# Patient Record
Sex: Male | Born: 1940 | Race: White | Hispanic: No | Marital: Married | Smoking: Never smoker
Health system: Southern US, Community
[De-identification: ages and names within clinical notes are randomized; demographics above are authoritative.]

## PROBLEM LIST (undated history)

## (undated) DIAGNOSIS — C679 Malignant neoplasm of bladder, unspecified: Secondary | ICD-10-CM

## (undated) DIAGNOSIS — E785 Hyperlipidemia, unspecified: Secondary | ICD-10-CM

## (undated) DIAGNOSIS — G4730 Sleep apnea, unspecified: Secondary | ICD-10-CM

## (undated) DIAGNOSIS — I509 Heart failure, unspecified: Secondary | ICD-10-CM

## (undated) DIAGNOSIS — H269 Unspecified cataract: Secondary | ICD-10-CM

## (undated) DIAGNOSIS — M1990 Unspecified osteoarthritis, unspecified site: Secondary | ICD-10-CM

## (undated) DIAGNOSIS — G7000 Myasthenia gravis without (acute) exacerbation: Secondary | ICD-10-CM

## (undated) DIAGNOSIS — I639 Cerebral infarction, unspecified: Secondary | ICD-10-CM

## (undated) DIAGNOSIS — I482 Chronic atrial fibrillation, unspecified: Secondary | ICD-10-CM

## (undated) DIAGNOSIS — R0902 Hypoxemia: Secondary | ICD-10-CM

## (undated) DIAGNOSIS — K219 Gastro-esophageal reflux disease without esophagitis: Secondary | ICD-10-CM

## (undated) DIAGNOSIS — C772 Secondary and unspecified malignant neoplasm of intra-abdominal lymph nodes: Secondary | ICD-10-CM

## (undated) DIAGNOSIS — E119 Type 2 diabetes mellitus without complications: Secondary | ICD-10-CM

## (undated) DIAGNOSIS — R011 Cardiac murmur, unspecified: Secondary | ICD-10-CM

## (undated) DIAGNOSIS — I10 Essential (primary) hypertension: Secondary | ICD-10-CM

## (undated) HISTORY — DX: Hyperlipidemia, unspecified: E78.5

## (undated) HISTORY — DX: Cerebral infarction, unspecified: I63.9

## (undated) HISTORY — DX: Cardiac murmur, unspecified: R01.1

## (undated) HISTORY — PX: OTHER SURGICAL HISTORY: SHX169

## (undated) HISTORY — DX: Unspecified cataract: H26.9

## (undated) HISTORY — DX: Myasthenia gravis without (acute) exacerbation: G70.00

## (undated) HISTORY — DX: Gastro-esophageal reflux disease without esophagitis: K21.9

## (undated) HISTORY — DX: Sleep apnea, unspecified: G47.30

## (undated) HISTORY — DX: Unspecified osteoarthritis, unspecified site: M19.90

## (undated) HISTORY — DX: Hypoxemia: R09.02

---

## 2010-04-29 ENCOUNTER — Encounter: Payer: Self-pay | Admitting: *Deleted

## 2010-04-29 ENCOUNTER — Telehealth (INDEPENDENT_AMBULATORY_CARE_PROVIDER_SITE_OTHER): Payer: Self-pay | Admitting: *Deleted

## 2010-05-03 ENCOUNTER — Ambulatory Visit: Payer: Self-pay

## 2010-05-03 ENCOUNTER — Encounter: Payer: Self-pay | Admitting: Cardiology

## 2010-05-03 ENCOUNTER — Encounter (HOSPITAL_COMMUNITY): Admission: RE | Admit: 2010-05-03 | Discharge: 2010-07-21 | Payer: Self-pay | Admitting: Family Medicine

## 2010-05-03 ENCOUNTER — Ambulatory Visit (HOSPITAL_COMMUNITY): Admission: RE | Admit: 2010-05-03 | Discharge: 2010-05-03 | Payer: Self-pay | Admitting: Family Medicine

## 2010-05-03 ENCOUNTER — Ambulatory Visit: Payer: Self-pay | Admitting: Cardiology

## 2010-12-14 NOTE — Progress Notes (Signed)
Summary: Nuclear Pre-Procedure  Phone Note Outgoing Call Call back at [facility] Phone [phone]   Call placed by: Stanton Kidney, EMT-P,  April 29, 2010 11:24 AM Action Taken: Phone Call Completed Summary of Call: Reviewed information on Myoview Information Sheet (see scanned document for further details).  Spoke with Patient's wife.    Nuclear Med Background Indications for Stress Test: Evaluation for Ischemia   History: GXT  History Comments: '93 & '00 GXT: (-)  Symptoms: Chest Pressure, DOE    Nuclear Pre-Procedure Cardiac Risk Factors: CVA, Hypertension, NIDDM Chest Size (in) 52     Height (in): 67

## 2010-12-14 NOTE — Assessment & Plan Note (Signed)
Summary: Cardiology Nuclear Study  Nuclear Med Background Indications for Stress Test: Evaluation for Ischemia   History: Echo, GXT  History Comments: '00 [id number]; 05/03/10 Echo:EF=60%, mild AS  Symptoms: Chest Pressure, Chest Pressure with Exertion, DOE, Fatigue, Palpitations  Symptoms Comments: Last episode of JW:[id number]   Nuclear Pre-Procedure Cardiac Risk Factors: CVA, Family History - CAD, Hypertension, NIDDM, Obesity Caffeine/Decaff Intake: None NPO After: 10:00 PM Lungs: Clear.  O2 Sat 97% on RA. IV 0.9% NS with Angio Cath: 22g     IV Site: (R) Hand IV Started by: Stanton Kidney EMT-P Chest Size (in) 52     Height (in): 67 Weight (lb): 290 BMI: 45.58 Tech Comments: CBG= 127 @ 5 am this day,  per Patient  Nuclear Med Study 1 or 2 day study:  1 day     Stress Test Type:  Eugenie Birks Reading MD:  Willa Rough, MD     Referring MD:  Lucila Maine, MD Resting Radionuclide:  Technetium 44m Tetrofosmin     Resting Radionuclide Dose:  11 mCi  Stress Radionuclide:  Technetium 84m Tetrofosmin     Stress Radionuclide Dose:  33 mCi   Stress Protocol   Lexiscan: 0.4 mg   Stress Test Technologist:  Rea [employer] CMA-N     Nuclear Technologist:  Domenic Polite CNMT  Rest Procedure  Myocardial perfusion imaging was performed at rest 45 minutes following the intravenous administration of Myoview Technetium 26m Tetrofosmin.  Stress Procedure  The patient received IV Lexiscan 0.4 mg over 15-seconds.  Myoview injected at 30-seconds.  There were no significant changes with infusion; other than occasional PVC's.  He did c/o chest tightness.  Quantitative spect images were obtained after a 45 minute delay.  QPS Raw Data Images:  Normal; no motion artifact; normal heart/lung ratio. Stress Images:  There is normal uptake in all areas. Rest Images:  Normal homogeneous uptake in all areas of the myocardium. Subtraction (SDS):  No evidence of ischemia. Transient Ischemic Dilatation:   1.11  (Normal <1.22)  Lung/Heart Ratio:  .3  (Normal <0.45)  Quantitative Gated Spect Images QGS EDV:  137 ml QGS ESV:  57 ml QGS EF:  59 % QGS cine images:  Normal motion  Findings Normal nuclear study      Overall Impression  Exercise Capacity: Lexiscan BP Response: Normal blood pressure response. Clinical Symptoms: chest tight ECG Impression: No significant ST segment change suggestive of ischemia. Overall Impression: Normal stress nuclear study.

## 2014-12-05 ENCOUNTER — Inpatient Hospital Stay (HOSPITAL_COMMUNITY)
Admission: AD | Admit: 2014-12-05 | Discharge: 2014-12-17 | DRG: 208 | Disposition: A | Discharge status: Rehabilitation Facility | Payer: PPO | Source: Other Acute Inpatient Hospital | Attending: Internal Medicine | Admitting: Internal Medicine

## 2014-12-05 ENCOUNTER — Encounter (HOSPITAL_COMMUNITY): Payer: Self-pay | Admitting: Neurology

## 2014-12-05 ENCOUNTER — Inpatient Hospital Stay (HOSPITAL_COMMUNITY): Payer: PPO

## 2014-12-05 DIAGNOSIS — A047 Enterocolitis due to Clostridium difficile: Secondary | ICD-10-CM | POA: Diagnosis not present

## 2014-12-05 DIAGNOSIS — J9601 Acute respiratory failure with hypoxia: Secondary | ICD-10-CM | POA: Diagnosis present

## 2014-12-05 DIAGNOSIS — C772 Secondary and unspecified malignant neoplasm of intra-abdominal lymph nodes: Secondary | ICD-10-CM | POA: Diagnosis present

## 2014-12-05 DIAGNOSIS — I482 Chronic atrial fibrillation: Secondary | ICD-10-CM | POA: Diagnosis present

## 2014-12-05 DIAGNOSIS — T380X5A Adverse effect of glucocorticoids and synthetic analogues, initial encounter: Secondary | ICD-10-CM | POA: Diagnosis not present

## 2014-12-05 DIAGNOSIS — Z8673 Personal history of transient ischemic attack (TIA), and cerebral infarction without residual deficits: Secondary | ICD-10-CM | POA: Diagnosis not present

## 2014-12-05 DIAGNOSIS — Z9221 Personal history of antineoplastic chemotherapy: Secondary | ICD-10-CM

## 2014-12-05 DIAGNOSIS — N179 Acute kidney failure, unspecified: Secondary | ICD-10-CM | POA: Diagnosis not present

## 2014-12-05 DIAGNOSIS — E876 Hypokalemia: Secondary | ICD-10-CM | POA: Diagnosis present

## 2014-12-05 DIAGNOSIS — I509 Heart failure, unspecified: Secondary | ICD-10-CM | POA: Diagnosis not present

## 2014-12-05 DIAGNOSIS — K219 Gastro-esophageal reflux disease without esophagitis: Secondary | ICD-10-CM | POA: Diagnosis present

## 2014-12-05 DIAGNOSIS — J9690 Respiratory failure, unspecified, unspecified whether with hypoxia or hypercapnia: Secondary | ICD-10-CM

## 2014-12-05 DIAGNOSIS — I4891 Unspecified atrial fibrillation: Secondary | ICD-10-CM | POA: Diagnosis present

## 2014-12-05 DIAGNOSIS — E119 Type 2 diabetes mellitus without complications: Secondary | ICD-10-CM | POA: Diagnosis present

## 2014-12-05 DIAGNOSIS — A0472 Enterocolitis due to Clostridium difficile, not specified as recurrent: Secondary | ICD-10-CM | POA: Diagnosis not present

## 2014-12-05 DIAGNOSIS — G7001 Myasthenia gravis with (acute) exacerbation: Secondary | ICD-10-CM | POA: Diagnosis present

## 2014-12-05 DIAGNOSIS — I10 Essential (primary) hypertension: Secondary | ICD-10-CM

## 2014-12-05 DIAGNOSIS — R1310 Dysphagia, unspecified: Secondary | ICD-10-CM | POA: Diagnosis present

## 2014-12-05 DIAGNOSIS — J9600 Acute respiratory failure, unspecified whether with hypoxia or hypercapnia: Secondary | ICD-10-CM | POA: Diagnosis present

## 2014-12-05 DIAGNOSIS — E870 Hyperosmolality and hypernatremia: Secondary | ICD-10-CM | POA: Diagnosis not present

## 2014-12-05 DIAGNOSIS — Z452 Encounter for adjustment and management of vascular access device: Secondary | ICD-10-CM

## 2014-12-05 DIAGNOSIS — G7000 Myasthenia gravis without (acute) exacerbation: Secondary | ICD-10-CM | POA: Diagnosis not present

## 2014-12-05 DIAGNOSIS — D72829 Elevated white blood cell count, unspecified: Secondary | ICD-10-CM | POA: Diagnosis not present

## 2014-12-05 DIAGNOSIS — C679 Malignant neoplasm of bladder, unspecified: Secondary | ICD-10-CM | POA: Diagnosis present

## 2014-12-05 DIAGNOSIS — D696 Thrombocytopenia, unspecified: Secondary | ICD-10-CM | POA: Diagnosis not present

## 2014-12-05 DIAGNOSIS — Z7901 Long term (current) use of anticoagulants: Secondary | ICD-10-CM

## 2014-12-05 HISTORY — DX: Heart failure, unspecified: I50.9

## 2014-12-05 HISTORY — DX: Type 2 diabetes mellitus without complications: E11.9

## 2014-12-05 HISTORY — DX: Chronic atrial fibrillation, unspecified: I48.20

## 2014-12-05 HISTORY — DX: Secondary and unspecified malignant neoplasm of intra-abdominal lymph nodes: C77.2

## 2014-12-05 HISTORY — DX: Malignant neoplasm of bladder, unspecified: C67.9

## 2014-12-05 HISTORY — DX: Essential (primary) hypertension: I10

## 2014-12-05 LAB — POCT I-STAT, CHEM 8
BUN: 30 mg/dL — AB (ref 6–23)
CHLORIDE: 108 mmol/L (ref 96–112)
CREATININE: 1.4 mg/dL — AB (ref 0.50–1.35)
Calcium, Ion: 1.21 mmol/L (ref 1.13–1.30)
Glucose, Bld: 203 mg/dL — ABNORMAL HIGH (ref 70–99)
HCT: 46 % (ref 39.0–52.0)
Hemoglobin: 15.6 g/dL (ref 13.0–17.0)
Potassium: 3.3 mmol/L — ABNORMAL LOW (ref 3.5–5.1)
SODIUM: 146 mmol/L — AB (ref 135–145)
TCO2: 24 mmol/L (ref 0–100)

## 2014-12-05 LAB — GLUCOSE, CAPILLARY: GLUCOSE-CAPILLARY: 212 mg/dL — AB (ref 70–99)

## 2014-12-05 LAB — COMPREHENSIVE METABOLIC PANEL
ALK PHOS: 70 U/L (ref 39–117)
ALT: 45 U/L (ref 0–53)
ANION GAP: 11 (ref 5–15)
AST: 42 U/L — ABNORMAL HIGH (ref 0–37)
Albumin: 3.6 g/dL (ref 3.5–5.2)
BILIRUBIN TOTAL: 0.9 mg/dL (ref 0.3–1.2)
BUN: 26 mg/dL — ABNORMAL HIGH (ref 6–23)
CHLORIDE: 107 mmol/L (ref 96–112)
CO2: 28 mmol/L (ref 19–32)
Calcium: 9.2 mg/dL (ref 8.4–10.5)
Creatinine, Ser: 1.5 mg/dL — ABNORMAL HIGH (ref 0.50–1.35)
GFR calc Af Amer: 51 mL/min — ABNORMAL LOW (ref >90)
GFR calc non Af Amer: 44 mL/min — ABNORMAL LOW (ref >90)
Glucose, Bld: 208 mg/dL — ABNORMAL HIGH (ref 70–99)
Potassium: 3.4 mmol/L — ABNORMAL LOW (ref 3.5–5.1)
SODIUM: 146 mmol/L — AB (ref 135–145)
TOTAL PROTEIN: 6.8 g/dL (ref 6.0–8.3)

## 2014-12-05 LAB — POCT I-STAT 3, ART BLOOD GAS (G3+)
ACID-BASE EXCESS: 3 mmol/L — AB (ref 0.0–2.0)
Bicarbonate: 27.4 mEq/L — ABNORMAL HIGH (ref 20.0–24.0)
O2 Saturation: 99 %
PH ART: 7.453 — AB (ref 7.350–7.450)
TCO2: 29 mmol/L (ref 0–100)
pCO2 arterial: 39.1 mmHg (ref 35.0–45.0)
pO2, Arterial: 126 mmHg — ABNORMAL HIGH (ref 80.0–100.0)

## 2014-12-05 LAB — CBC
HCT: 43.2 % (ref 39.0–52.0)
HEMOGLOBIN: 14.8 g/dL (ref 13.0–17.0)
MCH: 31.7 pg (ref 26.0–34.0)
MCHC: 34.3 g/dL (ref 30.0–36.0)
MCV: 92.5 fL (ref 78.0–100.0)
Platelets: 220 10*3/uL (ref 150–400)
RBC: 4.67 MIL/uL (ref 4.22–5.81)
RDW: 13.1 % (ref 11.5–15.5)
WBC: 15.2 10*3/uL — AB (ref 4.0–10.5)

## 2014-12-05 LAB — MRSA PCR SCREENING: MRSA BY PCR: NEGATIVE

## 2014-12-05 LAB — PROTIME-INR
INR: 1.24 (ref 0.00–1.49)
PROTHROMBIN TIME: 15.8 s — AB (ref 11.6–15.2)

## 2014-12-05 MED ORDER — PANTOPRAZOLE SODIUM 40 MG IV SOLR
40.0000 mg | INTRAVENOUS | Status: DC
  Administered 2014-12-05 – 2014-12-07 (×3): 40 mg via INTRAVENOUS
  Filled 2014-12-05 (×4): qty 40

## 2014-12-05 MED ORDER — SODIUM CHLORIDE 0.9 % IV SOLN
Freq: Once | INTRAVENOUS | Status: DC
  Filled 2014-12-05: qty 200

## 2014-12-05 MED ORDER — METHYLPREDNISOLONE SODIUM SUCC 40 MG IJ SOLR
40.0000 mg | Freq: Every day | INTRAMUSCULAR | Status: DC
  Administered 2014-12-06: 40 mg via INTRAVENOUS
  Filled 2014-12-05: qty 1

## 2014-12-05 MED ORDER — SODIUM CHLORIDE 0.9 % IV SOLN
INTRAVENOUS | Status: AC
  Administered 2014-12-06 (×3): via INTRAVENOUS_CENTRAL
  Filled 2014-12-05 (×3): qty 200

## 2014-12-05 MED ORDER — MIDAZOLAM HCL 2 MG/2ML IJ SOLN
INTRAMUSCULAR | Status: AC
  Administered 2014-12-05: 2 mg
  Filled 2014-12-05: qty 2

## 2014-12-05 MED ORDER — SODIUM CHLORIDE 0.9 % IV SOLN
25.0000 ug/h | INTRAVENOUS | Status: DC
  Administered 2014-12-05: 100 ug/h via INTRAVENOUS
  Administered 2014-12-06: 50 ug/h via INTRAVENOUS
  Filled 2014-12-05 (×2): qty 50

## 2014-12-05 MED ORDER — CETYLPYRIDINIUM CHLORIDE 0.05 % MT LIQD
7.0000 mL | Freq: Four times a day (QID) | OROMUCOSAL | Status: DC
  Administered 2014-12-06 – 2014-12-17 (×45): 7 mL via OROMUCOSAL

## 2014-12-05 MED ORDER — MIDAZOLAM HCL 2 MG/2ML IJ SOLN
2.0000 mg | Freq: Once | INTRAMUSCULAR | Status: AC
  Administered 2014-12-05: 2 mg via INTRAVENOUS

## 2014-12-05 MED ORDER — DIPHENHYDRAMINE HCL 25 MG PO CAPS: 25.0000 mg | ORAL_CAPSULE | Freq: Four times a day (QID) | ORAL | Status: DC | PRN

## 2014-12-05 MED ORDER — MIDAZOLAM HCL 2 MG/2ML IJ SOLN
4.0000 mg | Freq: Once | INTRAMUSCULAR | Status: AC
  Administered 2014-12-05: 4 mg via INTRAVENOUS

## 2014-12-05 MED ORDER — INSULIN ASPART 100 UNIT/ML ~~LOC~~ SOLN
0.0000 [IU] | SUBCUTANEOUS | Status: DC
  Administered 2014-12-05: 7 [IU] via SUBCUTANEOUS
  Administered 2014-12-06 (×6): 4 [IU] via SUBCUTANEOUS
  Administered 2014-12-07: 2 [IU] via SUBCUTANEOUS
  Administered 2014-12-07 (×4): 4 [IU] via SUBCUTANEOUS
  Administered 2014-12-07: 6 [IU] via SUBCUTANEOUS
  Administered 2014-12-08: 3 [IU] via SUBCUTANEOUS
  Administered 2014-12-08: 100 [IU] via SUBCUTANEOUS
  Administered 2014-12-08 (×2): 3 [IU] via SUBCUTANEOUS
  Administered 2014-12-08: 4 [IU] via SUBCUTANEOUS
  Administered 2014-12-08 (×2): 3 [IU] via SUBCUTANEOUS
  Administered 2014-12-09: 11 [IU] via SUBCUTANEOUS
  Administered 2014-12-09 (×2): 4 [IU] via SUBCUTANEOUS
  Administered 2014-12-09: 3 [IU] via SUBCUTANEOUS
  Administered 2014-12-09: 4 [IU] via SUBCUTANEOUS
  Administered 2014-12-09 – 2014-12-10 (×2): 7 [IU] via SUBCUTANEOUS
  Administered 2014-12-10: 4 [IU] via SUBCUTANEOUS
  Administered 2014-12-10: 7 [IU] via SUBCUTANEOUS
  Administered 2014-12-10 (×2): 3 [IU] via SUBCUTANEOUS
  Administered 2014-12-10: 4 [IU] via SUBCUTANEOUS
  Administered 2014-12-11: 3 [IU] via SUBCUTANEOUS
  Administered 2014-12-11 (×2): 7 [IU] via SUBCUTANEOUS
  Administered 2014-12-11: 4 [IU] via SUBCUTANEOUS
  Administered 2014-12-11: 3 [IU] via SUBCUTANEOUS
  Administered 2014-12-12 (×5): 4 [IU] via SUBCUTANEOUS
  Administered 2014-12-12: 3 [IU] via SUBCUTANEOUS
  Administered 2014-12-13: 4 [IU] via SUBCUTANEOUS
  Administered 2014-12-13: 11 [IU] via SUBCUTANEOUS
  Administered 2014-12-13: 7 [IU] via SUBCUTANEOUS
  Administered 2014-12-13 (×2): 3 [IU] via SUBCUTANEOUS
  Administered 2014-12-13 – 2014-12-14 (×2): 4 [IU] via SUBCUTANEOUS
  Administered 2014-12-14 (×2): 7 [IU] via SUBCUTANEOUS
  Administered 2014-12-14: 3 [IU] via SUBCUTANEOUS
  Administered 2014-12-14 (×2): 4 [IU] via SUBCUTANEOUS
  Administered 2014-12-15: 3 [IU] via SUBCUTANEOUS
  Administered 2014-12-15: 7 [IU] via SUBCUTANEOUS
  Administered 2014-12-15: 11 [IU] via SUBCUTANEOUS
  Administered 2014-12-15: 4 [IU] via SUBCUTANEOUS
  Administered 2014-12-15 – 2014-12-16 (×2): 3 [IU] via SUBCUTANEOUS
  Administered 2014-12-16: 11 [IU] via SUBCUTANEOUS
  Administered 2014-12-16: 3 [IU] via SUBCUTANEOUS
  Administered 2014-12-16 – 2014-12-17 (×3): 4 [IU] via SUBCUTANEOUS
  Administered 2014-12-17 (×2): 3 [IU] via SUBCUTANEOUS
  Administered 2014-12-17: 4 [IU] via SUBCUTANEOUS

## 2014-12-05 MED ORDER — ALBUTEROL SULFATE (2.5 MG/3ML) 0.083% IN NEBU
2.5000 mg | INHALATION_SOLUTION | RESPIRATORY_TRACT | Status: DC | PRN
Start: 2014-12-05 — End: 2014-12-17

## 2014-12-05 MED ORDER — FENTANYL CITRATE 0.05 MG/ML IJ SOLN
INTRAMUSCULAR | Status: AC
  Filled 2014-12-05: qty 2

## 2014-12-05 MED ORDER — HEPARIN SOD (PORK) LOCK FLUSH 100 UNIT/ML IV SOLN
500.0000 [IU] | Freq: Once | INTRAVENOUS | Status: DC
  Filled 2014-12-05: qty 5

## 2014-12-05 MED ORDER — CHLORHEXIDINE GLUCONATE 0.12 % MT SOLN
15.0000 mL | Freq: Two times a day (BID) | OROMUCOSAL | Status: DC
  Administered 2014-12-05 – 2014-12-17 (×24): 15 mL via OROMUCOSAL
  Filled 2014-12-05 (×21): qty 15

## 2014-12-05 MED ORDER — FENTANYL CITRATE 0.05 MG/ML IJ SOLN
200.0000 ug | Freq: Once | INTRAMUSCULAR | Status: AC
  Administered 2014-12-05: 200 ug via INTRAVENOUS

## 2014-12-05 MED ORDER — METOPROLOL TARTRATE 25 MG PO TABS
25.0000 mg | ORAL_TABLET | Freq: Two times a day (BID) | ORAL | Status: DC
  Filled 2014-12-05 (×3): qty 1

## 2014-12-05 MED ORDER — HEPARIN SODIUM (PORCINE) 1000 UNIT/ML IJ SOLN
1000.0000 [IU] | Freq: Once | INTRAMUSCULAR | Status: DC
  Filled 2014-12-05: qty 1

## 2014-12-05 MED ORDER — SODIUM CHLORIDE 0.9 % IJ SOLN
3.0000 mL | Freq: Two times a day (BID) | INTRAMUSCULAR | Status: DC
  Administered 2014-12-05 – 2014-12-06 (×2): 3 mL via INTRAVENOUS

## 2014-12-05 MED ORDER — MIDAZOLAM HCL 2 MG/2ML IJ SOLN
INTRAMUSCULAR | Status: AC
  Filled 2014-12-05: qty 2

## 2014-12-05 MED ORDER — SODIUM CHLORIDE 0.9 % IV SOLN
INTRAVENOUS | Status: DC
  Administered 2014-12-05 – 2014-12-06 (×2): via INTRAVENOUS
  Administered 2014-12-07: 50 mL/h via INTRAVENOUS

## 2014-12-05 MED ORDER — ACETAMINOPHEN 325 MG PO TABS: 650.0000 mg | ORAL_TABLET | ORAL | Status: DC | PRN

## 2014-12-05 MED ORDER — FENTANYL BOLUS VIA INFUSION
25.0000 ug | INTRAVENOUS | Status: DC | PRN
  Filled 2014-12-05: qty 25

## 2014-12-05 MED ORDER — CALCIUM GLUCONATE 10 % IV SOLN
4.0000 g | Freq: Once | INTRAVENOUS | Status: DC
  Filled 2014-12-05: qty 40

## 2014-12-05 MED ORDER — ACD FORMULA A 0.73-2.45-2.2 GM/100ML VI SOLN
500.0000 mL | Status: DC
  Administered 2014-12-05: 500 mL via INTRAVENOUS
  Filled 2014-12-05: qty 500

## 2014-12-05 MED ORDER — AMLODIPINE BESYLATE 10 MG PO TABS
10.0000 mg | ORAL_TABLET | Freq: Every day | ORAL | Status: DC
  Administered 2014-12-06 – 2014-12-07 (×2): 10 mg via ORAL
  Filled 2014-12-05 (×4): qty 1

## 2014-12-05 MED ORDER — POTASSIUM CHLORIDE 20 MEQ/15ML (10%) PO SOLN: 20.0000 meq | Freq: Once | ORAL | Status: DC

## 2014-12-05 MED ORDER — VITAL HIGH PROTEIN PO LIQD
1000.0000 mL | ORAL | Status: DC
  Administered 2014-12-05 – 2014-12-11 (×7): 1000 mL
  Filled 2014-12-05 (×8): qty 1000

## 2014-12-05 MED ORDER — ARTIFICIAL TEARS OP OINT
TOPICAL_OINTMENT | OPHTHALMIC | Status: DC | PRN
  Administered 2014-12-07: 1 via OPHTHALMIC
  Administered 2014-12-08 – 2014-12-09 (×4): via OPHTHALMIC
  Filled 2014-12-05: qty 3.5

## 2014-12-05 MED ORDER — FENTANYL CITRATE 0.05 MG/ML IJ SOLN: 50.0000 ug | Freq: Once | INTRAMUSCULAR | Status: DC

## 2014-12-05 MED ORDER — HEPARIN SODIUM (PORCINE) 1000 UNIT/ML IJ SOLN
3000.0000 [IU] | Freq: Once | INTRAMUSCULAR | Status: AC
  Administered 2014-12-05: 3000 [IU] via INTRAVENOUS
  Filled 2014-12-05: qty 3

## 2014-12-05 MED ORDER — AMIODARONE HCL 100 MG PO TABS
100.0000 mg | ORAL_TABLET | Freq: Every day | ORAL | Status: DC
  Administered 2014-12-06 – 2014-12-07 (×2): 100 mg via ORAL
  Filled 2014-12-05 (×3): qty 1

## 2014-12-05 MED ORDER — ACD FORMULA A 0.73-2.45-2.2 GM/100ML VI SOLN
Status: AC
  Administered 2014-12-05: 500 mL via INTRAVENOUS
  Filled 2014-12-05: qty 500

## 2014-12-05 NOTE — Consult Note (Signed)
PULMONARY / CRITICAL CARE MEDICINE   Name: Craig [name] MRN: [id number] DOB: 10/28/1941    ADMISSION DATE:  12/05/2014 CONSULTATION DATE:  12/05/14  REFERRING MD :  Dr. Coralyn Pear  CHIEF COMPLAINT:  Respiratory Failure  INITIAL PRESENTATION: [name], never smoker, transferred to [facility] on 1/22 with acute MG exacerbation & respiratory failure.  Decompensated requiring intubation 1/22 pm.  Tx to ICU.    STUDIES:  1/22  CTA Chest >> neg for PE, bilateral lower lobe atelectasis, scarring, calcified plaque in LAD, L circ, L thyroid nodule  SIGNIFICANT EVENTS: 1/22  Admit to [facility] with acute MG exacerbation, acute respiratory failure.  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  Family reports worsening over the past month but specifically worse the past week.  He was seen by his primary neurologist 3 days prior to presentation and was started on 60mg  of prednisone and increase in Mestinon from 60 mg to 90 mg TID.    ER work up was notable for hypoxemia.  He was evaluated with a chest xray which demonstrated low lung volumes and mild basilar atelectasis.  He was further evaluated with a CTA of the chest which was negative for PE, showed bilateral lower lobe atx / scarring, coronary atherosclerosis with calcified plaques in the LAD & L circumflex, L thyroid nodule.  VSS - T 97.9, HR87, RR 26, 172/86, SpO2 85%.  Labs notable for WBC 19.6, hgb 16, Plt 217, Na 149, K 3.0, glucose 133, sr cr 1.30, CPK 442, Troponin 0.03, myoglobin 362, pBNP 1330 (0-900 range).  He was transferred to [facility] for [facility] evaluation and admitted per [facility].   After admission, he developed worsening respiratory status.  PCCM consulted for evaluation.     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  has past surgical history that includes abdominal wall metastasis removal.   HOME MEDICATIONS:  Prior to Admission medications   Not on File   No Known Allergies  FAMILY HISTORY:  has no family status information on file.    SOCIAL HISTORY:  reports that he has never smoked. He does not have any smokeless tobacco history on file. He reports that he does not drink alcohol or use illicit drugs.  REVIEW OF SYSTEMS:  Unable to complete with patient.  Information obtained from family and prior medical documentation.   SUBJECTIVE:   VITAL SIGNS: Temp:  [99.2 F (37.3 C)] 99.2 F (37.3 C) (01/22 1734) Pulse Rate:  [89] 89 (01/22 1734) Resp:  [27] 27 (01/22 1734) BP: (139)/(75) 139/75 mmHg (01/22 1734) SpO2:  [91 %] 91 % (01/22 1734) FiO2 (%):  [100 %] 100 % (01/22 1847) Weight:  [239 lb 10.2 oz (108.7 kg)] 239 lb 10.2 oz (108.7 kg) (01/22 1734)   HEMODYNAMICS:     VENTILATOR SETTINGS: Vent Mode:  [-] PRVC FiO2 (%):  [100 %] 100 % Set Rate:  [16 bmp] 16 bmp Vt Set:  [550 mL] 550 mL PEEP:  [5 cmH20] 5 cmH20  Plateau Pressure:  [20 cmH20] 20 cmH20   INTAKE / OUTPUT: No intake or output data in the 24 hours ending 12/05/14 1923  PHYSICAL EXAMINATION: General:  Chronically ill appearing male  Neuro:  Ptosis, generalized weakness, unable to speak but nods in understanding HEENT:  MM pink/moist, no jvd, fair dentition  Cardiovascular:  s1s2 irr irr, afib on monitor  Lungs:  resp's shallow, abdominal accessory muscle use prior to intubation, lungs bilaterally coarse, rhonchi  Abdomen:  NTND, soft, bsx4 active  Musculoskeletal:  No acute deformities  Skin:   Warm/dry, no edema   LABS:  CBC No results for input(s): WBC, HGB, HCT, PLT in the last 168 hours.   Coag's No results for input(s): APTT, INR in the last 168 hours.   BMET No results for input(s): NA, K, CL, CO2, BUN, CREATININE, GLUCOSE in the last 168 hours.   Electrolytes No results for input(s): CALCIUM, MG, PHOS in the last 168 hours.   Sepsis Markers No results for input(s): LATICACIDVEN, PROCALCITON, O2SATVEN in the last 168 hours.   ABG No results for input(s): PHART, PCO2ART, PO2ART in the last 168 hours.   Liver Enzymes No results for input(s): AST, ALT, ALKPHOS, BILITOT, ALBUMIN in the last 168 hours.   Cardiac Enzymes No results for input(s): TROPONINI, PROBNP in the last 168 hours.   Glucose No results for input(s): GLUCAP in the last 168 hours.  Imaging No results found.   ASSESSMENT / PLAN:  NEUROLOGIC A:   Myasthenia Gravis with Acute Exacerbation Vent Associated Pain / Discomfort  Ptosis P:   RASS goal: -1 PAD 2, fentanyl gtt Plasmaphoresis per Neurology Solumedrol 40 mg QD Place plasmaphoresis cath Delirium prevention measures Lacrilube / eye care   PULMONARY OETT 1/22 >>  A: Acute Respiratory Failure - in the setting of MG exacerbation  R/O PNA  P:   Now intubation, 8cc/kg Follow up CXR, ABG post intubation Wean PEEP/FiO2 for sats > 92% PRN albuterol   Trend CXR, basilar atx VAP prevention measures Will need NIF / VC once closer to extubation  SBT/WUA daily   CARDIOVASCULAR L CW Port >>> HD Cath 1/22 >>  A:  Hypertension  Chronic Atrial Fibrillation - on Xarelto at baseline P:  Continue amiodarone 100 mg QD, lopressor 12.5 BID, norvasc 10 mg  ICU monitoring  Hold home lasix, metolazone  EKG   RENAL A:  Suspect AKI - sr cr 1.30, CK 442, unclear baseline Mild Hypernatremia  Hypokalemia  P:   Hold nephrotoxic agents NS at 92ml/hr  Trend BMP Replace electrolytes as indicated  Repeat labs now   GASTROINTESTINAL /  GU  A:   Metastatic Bladder Cancer - mets to abdominal wall  GERD P:   PPI  NPO Begin TF per nutrition   HEMATOLOGIC A:   Chronic Anticoagulation - Xarelto in setting of Afib, not taken since 1/20 P:  Hold Xarelto Heparin gtt per pharmacy after catheter placement  Repeat labs now  INFECTIOUS  A:   Leukocytosis - likely stress response, r/o infectious etiology P:    UA 1/22 >> neg  Sputum 1/22 >>   Hold abx, monitor fever curve / wbc  ENDOCRINE A:  DM   P:   Hold starlix, metformin  SSI Q4   FAMILY  - Updates: family updated at bedside - wife and son   - Inter-disciplinary family meet or Palliative Care meeting due by:  1/29     [name], NP-C [facility]  Pgr: [phone] or [phone]   12/05/2014, 7:23 PM   Attending:  I have seen and examined the patient with nurse practitioner/resident and agree with the note above.   Case discussed with wife who notes that Bladder cancer is in remission but myasthenia has been a problem for the last week with increasing weakness.  He arrived to our facility with respiratory failure as noted above.  Lungs clear, shallow rapid breathing Neuro: awake and alert but weak extremities  CXR pending: atelectasis at OSH  Labs here pending  We placed an HD cath for plasmapheresis which is planned tonight ETT placed for acute respiratory failure with hypoxemia  Family updated bedside  CC time by me 66 minutes  Roselie Awkward, MD [facility] Pager: [phone] Cell: [phone] If no response, call [phone]

## 2014-12-05 NOTE — Progress Notes (Signed)
ANTICOAGULATION CONSULT NOTE - Initial Consult  Pharmacy Consult for Heparin Indication: atrial fibrillation  No Known Allergies  Patient Measurements: Height: 5\' 7"  (170.2 cm) Weight: 239 lb 10.2 oz (108.7 kg) IBW/kg (Calculated) : 66.1  Vital Signs: Temp: 99.2 F (37.3 C) (01/22 1734) Temp Source: Oral (01/22 1734) BP: 139/75 mmHg (01/22 1734) Pulse Rate: 89 (01/22 1734)  Labs: No results for input(s): HGB, HCT, PLT, APTT, LABPROT, INR, HEPARINUNFRC, CREATININE, CKTOTAL, CKMB, TROPONINI in the last 72 hours.  CrCl cannot be calculated (Patient has no serum creatinine result on file.).   Medical History: Past Medical History  Diagnosis Date  . Atrial fibrillation, chronic     on Xarelto   . HTN (hypertension)   . CHF (congestive heart failure)   . Diabetes mellitus   . 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 Lake Bells patient had last dose of xarelto on Wednesday.    Assessment: 74 yo M admitted 12/05/2014 with acute myasthenia gravis exacerbation and respiratory failure.  Pharmacy consulted to start heparin.  PMH: MG, Afib, CHF, PNA, DM, CVA  Coag: afib  Goal of Therapy:  Heparin level 0.3-0.7 units/ml Monitor platelets by anticoagulation protocol: Yes   Plan:  To start heparin 1300 units/hr, no bolus, 1h after plasmapheresis cathetier placed. Heparin level 8h after ggt starts. Daily heparin level and CBC.  Thank you for allowing pharmacy to be a part of this patients care team.  Rowe Robert Pharm.D., BCPS, AQ-Cardiology Clinical Pharmacist 12/05/2014 7:42 PM Pager: [phone] Phone: [phone]

## 2014-12-05 NOTE — Progress Notes (Addendum)
Pt admitted to [facility] from [facility] about 1 hour after arrival  physician came to floor to assess pt and decided pt does not need to be stepdown unit and needs to be on ICU. Pt intubated in room and orders given to transfer to 2S07. Amidate 20mg  given [id number]

## 2014-12-05 NOTE — Consult Note (Signed)
Reason for Consult:MG crisis Referring Physician: Coralyn Pear  CC: Difficulty swallowing, SOB  HPI: Craig [name] is an 74 y.o. male with a history of metastatic bladder CA and MG who until recently while being treated for his cancer was doing well with his MG.  Chemo stopped in October and about a month later he started to have more trouble.  About two weeks ago the patient presented with SOB and was felt to have fluid on his lungs and a PNA.  He was treated with antibiotics.  This week the patient presented to [facility] ED with complaints of difficulty swallowing and SOB.  Was evaluated and sent home.  Saw his outpatient neurologist the next day who started him on steroids and increased his Mestinon.  The patient continued to worsen and presented again today.    Past medical history: Metastatic bladder CA, CVA, atrial fibrillation, HTN , CHF, DM  Past surgical history: Removal of abdominal wall metastasis  Family history:  Two sons alive and well, mother deceased with colon cancer, father deceased with diabetes, siblings with HTN  Social History:  No history of tobacco, alcohol or illicit drug abuse  No Known Allergies  Medications: Starlix, Pacerone, Amlodipine, Lipitor, Flax Seed, Lasix, Glucosamine, Atrovent, Metolazone, Lopressor, MVI, Fish Oil, Potassium, Protonix, Mestinon 90mg  TID, Xarelto, Vitamin C, Iron, Zinc, Metformin, Prednisone 60mg  daily  ROS: History obtained from family  General ROS: negative for - chills, fatigue, fever, night sweats, weight gain or weight loss Psychological ROS: negative for - behavioral disorder, hallucinations, memory difficulties, mood swings or suicidal ideation Ophthalmic ROS: negative for - blurry vision, double vision, eye pain or loss of vision ENT ROS: negative for - epistaxis, nasal discharge, oral lesions, sore throat, tinnitus or vertigo Allergy and Immunology ROS: negative for - hives or itchy/watery eyes Hematological and Lymphatic ROS:  negative for - bleeding problems, bruising or swollen lymph nodes Endocrine ROS: negative for - galactorrhea, hair pattern changes, polydipsia/polyuria or temperature intolerance Respiratory ROS: shortness of breath Cardiovascular ROS: negative for - chest pain, dyspnea on exertion, edema or irregular heartbeat Gastrointestinal ROS: negative for - abdominal pain, diarrhea, hematemesis, nausea/vomiting or stool incontinence Genito-Urinary ROS: negative for - dysuria, hematuria, incontinence or urinary frequency/urgency Musculoskeletal ROS: muscular weakness Neurological ROS: as noted in HPI Dermatological ROS: negative for rash and skin lesion changes  Physical Examination: Blood pressure 139/75, pulse 89, temperature 99.2 F (37.3 C), temperature source Oral, resp. rate 27, height 5\' 7"  (1.702 m), weight 108.7 kg (239 lb 10.2 oz), SpO2 91 %.  HEENT-  Normocephalic, no lesions, without obvious abnormality.  Normal external eye and conjunctiva.  Normal TM's bilaterally.  Normal auditory canals and external ears. Normal external nose, mucus membranes and septum.  Normal pharynx. Cardiovascular- S1, S2 normal, pulses palpable throughout   Lungs- chest clear, no wheezing, rales, normal symmetric air entry Abdomen- soft, non-tender; bowel sounds normal; no masses,  no organomegaly Extremities- mild edema Lymph-no adenopathy palpable Musculoskeletal-no joint tenderness, deformity or swelling Skin-warm and dry, no hyperpigmentation, vitiligo, or suspicious lesions  Neurological Examination Mental Status: Lethargic, oriented, thought content appropriate.  Speech fluent without evidence of aphasia but working hard to breathe.  Able to follow simple commands. Cranial Nerves: II: Discs flat bilaterally; Blinks to bilateral confrontation, pupils equal, round, reactive to light and accommodation III,IV, VI: bilateral ptosis (right greater than left).  Unable to move the right eye in any direction.   Minimal movement laterally of the left eye but full excursion not present. V,VII: Unable  to smile, facial light touch sensation normal bilaterally VIII: hearing normal bilaterally IX,X: gag reflex reduced XI: no shoulder shrug XII: midline tongue extension Motor: Hand grip 5/5.  Flexion and extension present at the elbows.  Unable to lift arms at the shoulders.  5/5 in bilateral lower extremities Sensory: Pinprick and light touch intact throughout, bilaterally Deep Tendon Reflexes: 2+ on the right, 1+ on the LUE, trace left KJ and absent left AJ Plantars: Right: mute   Left: mute Cerebellar: Unable to perform finger-to-nose. Normal heel-to-shin testing    Laboratory Studies:  Labs from [facility] reviewed Basic Metabolic Panel: No results for input(s): NA, K, CL, CO2, GLUCOSE, BUN, CREATININE, CALCIUM, MG, PHOS in the last 168 hours.  Liver Function Tests: No results for input(s): AST, ALT, ALKPHOS, BILITOT, PROT, ALBUMIN in the last 168 hours. No results for input(s): LIPASE, AMYLASE in the last 168 hours. No results for input(s): AMMONIA in the last 168 hours.  CBC: No results for input(s): WBC, NEUTROABS, HGB, HCT, MCV, PLT in the last 168 hours.  Cardiac Enzymes: No results for input(s): CKTOTAL, CKMB, CKMBINDEX, TROPONINI in the last 168 hours.  BNP: Invalid input(s): POCBNP  CBG: No results for input(s): GLUCAP in the last 168 hours.  Microbiology: No results found for this or any previous visit.  Coagulation Studies: No results for input(s): LABPROT, INR in the last 72 hours.  Urinalysis: No results for input(s): COLORURINE, LABSPEC, PHURINE, GLUCOSEU, HGBUR, BILIRUBINUR, KETONESUR, PROTEINUR, UROBILINOGEN, NITRITE, LEUKOCYTESUR in the last 168 hours.  Invalid input(s): APPERANCEUR  Lipid Panel:  No results found for: CHOL, TRIG, HDL, CHOLHDL, VLDL, LDLCALC  HgbA1C: No results found for: HGBA1C  Urine Drug Screen:  No results found for: LABOPIA,  COCAINSCRNUR, LABBENZ, AMPHETMU, THCU, LABBARB  Alcohol Level: No results for input(s): ETH in the last 168 hours.  Other results: EKG: atrial fibrillation, rate 57 bpm.  Imaging: No results found.   Assessment/Plan: 74 year old male with a history of MG now presenting in myasthenic crisis.  Difficulty breathing.  Difficulty swallowing and eye movement abnormalities noted as well.  Due to respiratory status patient will benefit from elective intubation.  Plasmaphoresis to be initiated.  Will continue steroids once TPE initiated.    Recommendations: 1.  CCM contacted about placement of a catheter for plasmaphoresis 2.  Nephrology contacted and first treatment to occur tonight.   3.  Solumedrol 40mg  daily   Alexis Goodell, MD [facility] [phone] 12/05/2014, 6:47 PM

## 2014-12-05 NOTE — Procedures (Signed)
Intubation Procedure Note Craig [name] [id number] November 06, 1941  Procedure: Intubation Indications: Respiratory insufficiency  Procedure Details Consent: Risks of procedure as well as the alternatives and risks of each were explained to the (patient/caregiver).  Consent for procedure obtained.   Time Out: Verified patient identification, verified procedure, site/side was marked, verified correct patient position, special equipment/implants available, medications/allergies/relevent history reviewed, required imaging and test results available.  Performed  Maximum sterile technique was used including antiseptics, cap, gloves, gown, hand hygiene, mask and sheet.  MAC and 3.  Secured at 25 cm at teeth.   Grade 2 view> cords beefy, red, swollen 7.5 ETT passed with some resistance Tube placement confirmed by smoke in tube, bilateral chest rise, end tidal CO2 detector color change    Evaluation Hemodynamic Status: BP stable throughout; O2 sats: stable throughout Patient's Current Condition: stable Complications: No apparent complications Patient did tolerate procedure well. Chest X-ray ordered to verify placement.  CXR: pending.     Craig Gens, NP-C [facility] Pgr: [phone] or [phone]   12/05/2014   Attending:  I performed the procedure  Roselie Awkward, MD [facility] Pager: [phone] Cell: [phone] If no response, call [phone]

## 2014-12-05 NOTE — Progress Notes (Signed)
Patient came from [facility] MD intubated with a 7.5 ETT taped at 25 cm @lip  good color change on endtidal CO2 detector BBS equal SATS 94%, placed on above settings.

## 2014-12-05 NOTE — Progress Notes (Signed)
ANTICOAGULATION CONSULT NOTE - Initial Consult  Pharmacy Consult for Heparin Indication: atrial fibrillation  No Known Allergies  Patient Measurements: Height: 5\' 7"  (170.2 cm) Weight: 239 lb 10.2 oz (108.7 kg) IBW/kg (Calculated) : 66.1  Vital Signs: Temp: 99.2 F (37.3 C) (01/22 1734) Temp Source: Oral (01/22 1734) BP: 139/75 mmHg (01/22 1734) Pulse Rate: 89 (01/22 1734)  Labs:  Recent Labs  12/05/14 2015  HGB 14.8  HCT 43.2  PLT 220  LABPROT 15.8*  INR 1.24  CREATININE 1.50*    Estimated Creatinine Clearance: 50.8 mL/min (by C-G formula based on Cr of 1.5).   Medical History: Past Medical History  Diagnosis Date  . Atrial fibrillation, chronic     on Xarelto   . HTN (hypertension)   . CHF (congestive heart failure)   . Diabetes mellitus   . 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 Lake Bells patient had last dose of xarelto on Wednesday.    Assessment: 74 yo M admitted 12/05/2014 with acute myasthenia gravis exacerbation and respiratory failure.  Pharmacy consulted to start heparin.  PMH: MG, Afib, CHF, PNA, DM, CVA  Coag: afib  Goal of Therapy:  Heparin level 0.3-0.7 units/ml Monitor platelets by anticoagulation protocol: Yes   Plan:  To start heparin 1300 units/hr, no bolus, 1h after plasmapheresis cathetier placed. Heparin level 8h after ggt starts. Daily heparin level and CBC.  Thank you for allowing pharmacy to be a part of this patients care team.  Rowe Robert Pharm.D., BCPS, AQ-Cardiology Clinical Pharmacist 12/05/2014 8:59 PM Pager: [phone] Phone: [phone]  8:59 PM to start heparin, but also plasma exchange for MG, note risk of that is removal of clotting factors.  Spoke to [place] and discussed risk v benefit.  Agreed to wait to see how tolerates PLEx before starting heparin.  Follow up with CCM in am [place]

## 2014-12-05 NOTE — Procedures (Signed)
Hemodialysis Catheter Insertion Procedure Note Craig [name] [id number] 03-23-1941  Procedure: Insertion of Central Venous Catheter Indications: Acute myasthenia flare, needs plasmapheresis  Procedure Details Consent: Risks of procedure as well as the alternatives and risks of each were explained to the (patient/caregiver).  Consent for procedure obtained. Time Out: Verified patient identification, verified procedure, site/side was marked, verified correct patient position, special equipment/implants available, medications/allergies/relevent history reviewed, required imaging and test results available.  Performed  Maximum sterile technique was used including antiseptics, cap, gloves, gown, hand hygiene, mask and sheet. Skin prep: Chlorhexidine; local anesthetic administered A antimicrobial bonded/coated triple lumen catheter was placed in the right internal jugular vein using the Seldinger technique.  Ultrasound was used to verify the patency of the vein and for real time needle guidance.  Evaluation Blood flow good Complications: No apparent complications Patient did tolerate procedure well. Chest X-ray ordered to verify placement.  CXR: pending.  MCQUAID, DOUGLAS 12/05/2014, 8:25 PM

## 2014-12-05 NOTE — H&P (Signed)
[facility] History and Physical  Craig [name] SWN:[id number] DOB: 1941-02-14 DOA: 12/05/2014  Referring physician:  PCP: Ann Held, MD   Chief Complaint: Difficulty swallowing  HPI: Craig [name] is a 74 y.o. 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 Patient having progressive weakness over the past month becoming worse in past week. Was recently seen by his primary neurologist with complaints of worsening weakness for which he was started on 60 mg of prednisone by mouth daily in addition to Mestinon therapy. Despite this he continued to worsen and presented to the emergency department at [facility] with complaints of having difficulty swallowing along with shortness of breath. He was administered 125 mg of IV Solu-Medrol at [facility] prior to transfer to [facility]. On my evaluation patient unable to localize, breathing 33 breaths per minute having oxygen saturations in the upper 80s, having weak and shallow breaths, and acute respiratory failure. Case was discussed with pulmonary critical care medicine, patient was seen and evaluated by Dr. Lake Bells, patient undergoing rapid sequence intubation.                                                                                                                                                                                                                 Review of Systems:  Unable to obtain reliable review of systems given severe weakness and respiratory failure   No past medical history on file. No past surgical history on file. Social History:  has no tobacco, alcohol, and drug history on file.  No Known Allergies  No family history on file.   Prior to Admission medications   Not on File   Physical Exam: Filed Vitals:   12/05/14 1734  BP: 139/75  Pulse: 89  Temp: 99.2 F (37.3 C)  TempSrc: Oral  Resp: 27  Height: 5\' 7"  (1.702 m)  Weight: 108.7 kg (239 lb 10.2 oz)  SpO2: 91%    Wt Readings from Last 3 Encounters:  12/05/14 108.7 kg (239 lb 10.2 oz)  05/03/10 131.543 kg (290 lb)    General:  Ill appearing, having shallow breaths, severe generalized weakness, unable to vocalize.  Eyes: PERRL, normal lids, irises & conjunctiva, bilateral ptosis ENT: grossly normal hearing, lips & tongue Neck: no LAD, masses or thyromegaly Cardiovascular: RRR, no m/r/g. No LE edema. Telemetry: SR, no arrhythmias  Respiratory: Coarse respiratory sounds, bilateral rhonchi, taking  shallow breaths, decreased mentation Abdomen: soft, ntnd Skin: no rash or induration seen on limited exam Musculoskeletal: grossly normal tone BUE/BLE Psychiatric: grossly normal mood and affect, speech fluent and appropriate Neurologic: Patient having severe generalized weakness, unable to vocalize, bilateral ptosis, poor inspiratory effort.           Labs on Admission:  Basic Metabolic Panel: No results for input(s): NA, K, CL, CO2, GLUCOSE, BUN, CREATININE, CALCIUM, MG, PHOS in the last 168 hours. Liver Function Tests: No results for input(s): AST, ALT, ALKPHOS, BILITOT, PROT, ALBUMIN in the last 168 hours. No results for input(s): LIPASE, AMYLASE in the last 168 hours. No results for input(s): AMMONIA in the last 168 hours. CBC: No results for input(s): WBC, NEUTROABS, HGB, HCT, MCV, PLT in the last 168 hours. Cardiac Enzymes: No results for input(s): CKTOTAL, CKMB, CKMBINDEX, TROPONINI in the last 168 hours.  BNP (last 3 results) No results for input(s): PROBNP in the last 8760 hours. CBG: No results for input(s): GLUCAP in the last 168 hours.  Radiological Exams on Admission: No results found.  EKG: Independently reviewed.   Assessment/Plan Principal Problem:   Myasthenia gravis in crisis Active Problems:   Acute  respiratory failure   Bladder cancer   HTN (hypertension)   A-fib   Chronic anticoagulation   1. Myasthenia crisis. Patient with history of myasthenia gravis who was being treated with Mestinon in the outpatient setting, presenting with significant generalized weakness, oropharyngeal weakness, with respiratory compromise, likely secondary to myasthenia gravis crisis. Earlier this week he was seen by his neurologist who prescribed him prednisone. Case was discussed with neurology, who will make a decision regarding rapid treatment with either plasmapheresis or IVIG. Patient will be transferred to the intensive care unit as he is presently undergoing rapid sequence intubation. Await further recommendations from neurology. 2. Acute respiratory failure. Patient having shallow breaths, with respiratory rate of 33, holding sats of 87-88% this is likely related to Myasthenia crisis. PCCM was consulted as he underwent RSI.  3. Atrial Fibrillation. Patient is on chronic anticoagulation therapy with Xarelto. I discussed with PCCM starting IV heparin. This will be determined in the ICU. He remains rate controlled.  4. Hypertension. His blood pressures are stable on admission, having BP of 139/75. Hold antihypertensive agents for now.  5.  History of bladder cancer. Patient undergoing chemotherapy recently. Will check BMP and CBC.    Code Status: Full Code Family Communication: I spoke with his wife and son at bedside Disposition Plan: Will admit to ICU  Time spent: 70 min  Craig [name] [facility] Pager [phone]

## 2014-12-06 ENCOUNTER — Inpatient Hospital Stay (HOSPITAL_COMMUNITY): Payer: PPO

## 2014-12-06 ENCOUNTER — Encounter (HOSPITAL_COMMUNITY): Payer: Self-pay | Admitting: *Deleted

## 2014-12-06 DIAGNOSIS — I481 Persistent atrial fibrillation: Secondary | ICD-10-CM

## 2014-12-06 LAB — COMPREHENSIVE METABOLIC PANEL
ALBUMIN: 4.2 g/dL (ref 3.5–5.2)
ALT: 20 U/L (ref 0–53)
AST: 18 U/L (ref 0–37)
Alkaline Phosphatase: 30 U/L — ABNORMAL LOW (ref 39–117)
Anion gap: 9 (ref 5–15)
BUN: 26 mg/dL — AB (ref 6–23)
CO2: 26 mmol/L (ref 19–32)
CREATININE: 1.33 mg/dL (ref 0.50–1.35)
Calcium: 9.2 mg/dL (ref 8.4–10.5)
Chloride: 114 mmol/L — ABNORMAL HIGH (ref 96–112)
GFR calc non Af Amer: 51 mL/min — ABNORMAL LOW (ref >90)
GFR, EST AFRICAN AMERICAN: 59 mL/min — AB (ref >90)
GLUCOSE: 189 mg/dL — AB (ref 70–99)
POTASSIUM: 3.2 mmol/L — AB (ref 3.5–5.1)
Sodium: 149 mmol/L — ABNORMAL HIGH (ref 135–145)
Total Bilirubin: 0.9 mg/dL (ref 0.3–1.2)
Total Protein: 5.4 g/dL — ABNORMAL LOW (ref 6.0–8.3)

## 2014-12-06 LAB — URINALYSIS, ROUTINE W REFLEX MICROSCOPIC
BILIRUBIN URINE: NEGATIVE
Glucose, UA: NEGATIVE mg/dL
KETONES UR: 15 mg/dL — AB
NITRITE: NEGATIVE
Protein, ur: 30 mg/dL — AB
Specific Gravity, Urine: 1.02 (ref 1.005–1.030)
UROBILINOGEN UA: 0.2 mg/dL (ref 0.0–1.0)
pH: 5.5 (ref 5.0–8.0)

## 2014-12-06 LAB — GLUCOSE, CAPILLARY
GLUCOSE-CAPILLARY: 165 mg/dL — AB (ref 70–99)
GLUCOSE-CAPILLARY: 170 mg/dL — AB (ref 70–99)
GLUCOSE-CAPILLARY: 172 mg/dL — AB (ref 70–99)
Glucose-Capillary: 166 mg/dL — ABNORMAL HIGH (ref 70–99)
Glucose-Capillary: 166 mg/dL — ABNORMAL HIGH (ref 70–99)
Glucose-Capillary: 199 mg/dL — ABNORMAL HIGH (ref 70–99)

## 2014-12-06 LAB — URINE MICROSCOPIC-ADD ON

## 2014-12-06 LAB — CBC
HCT: 43.8 % (ref 39.0–52.0)
Hemoglobin: 14.7 g/dL (ref 13.0–17.0)
MCH: 31.2 pg (ref 26.0–34.0)
MCHC: 33.6 g/dL (ref 30.0–36.0)
MCV: 93 fL (ref 78.0–100.0)
Platelets: 199 10*3/uL (ref 150–400)
RBC: 4.71 MIL/uL (ref 4.22–5.81)
RDW: 12.9 % (ref 11.5–15.5)
WBC: 15.9 10*3/uL — AB (ref 4.0–10.5)

## 2014-12-06 LAB — PHOSPHORUS: PHOSPHORUS: 2.7 mg/dL (ref 2.3–4.6)

## 2014-12-06 LAB — MAGNESIUM: MAGNESIUM: 1.7 mg/dL (ref 1.5–2.5)

## 2014-12-06 LAB — HEPARIN LEVEL (UNFRACTIONATED): Heparin Unfractionated: 0.36 IU/mL (ref 0.30–0.70)

## 2014-12-06 MED ORDER — POTASSIUM CHLORIDE 20 MEQ/15ML (10%) PO SOLN
60.0000 meq | Freq: Once | ORAL | Status: AC
  Administered 2014-12-06: 60 meq
  Filled 2014-12-06: qty 45

## 2014-12-06 MED ORDER — ACD FORMULA A 0.73-2.45-2.2 GM/100ML VI SOLN
Status: AC
  Administered 2014-12-06: 500 mL
  Filled 2014-12-06: qty 500

## 2014-12-06 MED ORDER — HEPARIN (PORCINE) IN NACL 100-0.45 UNIT/ML-% IJ SOLN
1300.0000 [IU]/h | INTRAMUSCULAR | Status: DC
  Administered 2014-12-06 – 2014-12-07 (×2): 1300 [IU]/h via INTRAVENOUS
  Filled 2014-12-06 (×3): qty 250

## 2014-12-06 MED ORDER — SODIUM CHLORIDE 0.9 % IJ SOLN
10.0000 mL | Freq: Two times a day (BID) | INTRAMUSCULAR | Status: DC
  Administered 2014-12-06 – 2014-12-16 (×15): 10 mL

## 2014-12-06 MED ORDER — PRO-STAT SUGAR FREE PO LIQD
30.0000 mL | Freq: Four times a day (QID) | ORAL | Status: DC
  Administered 2014-12-06 – 2014-12-12 (×23): 30 mL
  Filled 2014-12-06 (×28): qty 30

## 2014-12-06 MED ORDER — FREE WATER
200.0000 mL | Freq: Four times a day (QID) | Status: DC
  Administered 2014-12-06 – 2014-12-07 (×4): 200 mL

## 2014-12-06 MED ORDER — SODIUM CHLORIDE 0.9 % IJ SOLN
10.0000 mL | INTRAMUSCULAR | Status: DC | PRN
  Administered 2014-12-07 – 2014-12-17 (×4): 10 mL
  Filled 2014-12-06 (×4): qty 40

## 2014-12-06 MED ORDER — METHYLPREDNISOLONE SODIUM SUCC 125 MG IJ SOLR
60.0000 mg | Freq: Every day | INTRAMUSCULAR | Status: DC
  Administered 2014-12-07 – 2014-12-11 (×5): 60 mg via INTRAVENOUS
  Filled 2014-12-06 (×6): qty 0.96

## 2014-12-06 NOTE — Progress Notes (Signed)
Subjective: Patient awake and alert.  Has undergone TPE X1.  Seems to have tolerated well.    Objective: Current vital signs: BP 107/55 mmHg  Pulse 72  Temp(Src) 98.1 F (36.7 C) (Oral)  Resp 14  Ht 5\' 7"  (1.702 m)  Wt 108.4 kg (238 lb 15.7 oz)  BMI 37.42 kg/m2  SpO2 95% Vital signs in last 24 hours: Temp:  [97.7 F (36.5 C)-99.2 F (37.3 C)] 98.1 F (36.7 C) (01/23 0700) Pulse Rate:  [51-89] 72 (01/23 1000) Resp:  [11-27] 14 (01/23 1000) BP: (89-139)/(52-75) 107/55 mmHg (01/23 1000) SpO2:  [91 %-100 %] 95 % (01/23 1000) FiO2 (%):  [40 %-100 %] 40 % (01/23 0811) Weight:  [108.4 kg (238 lb 15.7 oz)-108.7 kg (239 lb 10.2 oz)] 108.4 kg (238 lb 15.7 oz) (01/23 0415)  Intake/Output from previous day: 01/22 0701 - 01/23 0700 In: 966.7 [I.V.:646.7; NG/GT:320] Out: 500 [Urine:500] Intake/Output this shift: Total I/O In: 360 [I.V.:150; NG/GT:210] Out: 150 [Urine:150] Nutritional status: Diet NPO time specified  Neurologic Exam: Mental Status: Awake and alert.  Intubated.  Able to follow simple commands. Cranial Nerves: II: Discs flat bilaterally; Blinks to bilateral confrontation, pupils equal, round, reactive to light and accommodation III,IV, VI: Left ptosis improved.  Right ptosis unchanged.  . Unable to move the right eye in any direction. Minimal movement laterally of the left eye but full excursion not present. V,VII: Unable to smile, facial light touch sensation normal bilaterally VIII: hearing normal bilaterally IX,X: not tested XI: bilateral shoulder shrug XII: not tested Motor: Hand grip 5/5. Flexion and extension present at the elbows. Able to lift both arms at the shoulders about 15-30 degrees (left greater than right). 5/5 in bilateral lower extremities Sensory: Pinprick and light touch intact throughout, bilaterally Deep Tendon Reflexes: 2+ on the right, 1+ on the LUE, trace left KJ and absent left AJ Plantars: Right:  muteLeft: mute   Lab Results: Basic Metabolic Panel:  Recent Labs Lab 12/05/14 2015 12/05/14 2147 12/06/14 0357  NA 146* 146* 149*  K 3.4* 3.3* 3.2*  CL 107 108 114*  CO2 28  --  26  GLUCOSE 208* 203* 189*  BUN 26* 30* 26*  CREATININE 1.50* 1.40* 1.33  CALCIUM 9.2  --  9.2  MG  --   --  1.7  PHOS  --   --  2.7    Liver Function Tests:  Recent Labs Lab 12/05/14 2015 12/06/14 0357  AST 42* 18  ALT 45 20  ALKPHOS 70 30*  BILITOT 0.9 0.9  PROT 6.8 5.4*  ALBUMIN 3.6 4.2   No results for input(s): LIPASE, AMYLASE in the last 168 hours. No results for input(s): AMMONIA in the last 168 hours.  CBC:  Recent Labs Lab 12/05/14 2015 12/05/14 2147 12/06/14 0357  WBC 15.2*  --  15.9*  HGB 14.8 15.6 14.7  HCT 43.2 46.0 43.8  MCV 92.5  --  93.0  PLT 220  --  199    Cardiac Enzymes: No results for input(s): CKTOTAL, CKMB, CKMBINDEX, TROPONINI in the last 168 hours.  Lipid Panel: No results for input(s): CHOL, TRIG, HDL, CHOLHDL, VLDL, LDLCALC in the last 168 hours.  CBG:  Recent Labs Lab 12/05/14 2038 12/06/14 0007 12/06/14 0426 12/06/14 0716  GLUCAP 212* 170* 166* 165*    Microbiology: Results for orders placed or performed during the hospital encounter of 12/05/14  MRSA PCR Screening     Status: None   Collection Time: 12/05/14  6:01 PM  Result Value Ref Range Status   MRSA by PCR NEGATIVE NEGATIVE Final    Comment:        The GeneXpert MRSA Assay (FDA approved for NASAL specimens only), is one component of a comprehensive MRSA colonization surveillance program. It is not intended to diagnose MRSA infection nor to guide or monitor treatment for MRSA infections.     Coagulation Studies:  Recent Labs  12/05/14 2015  LABPROT 15.8*  INR 1.24    Imaging: Dg Chest Port 1 View  12/06/2014   CLINICAL DATA:  Acute respiratory failure  EXAM: PORTABLE CHEST - 1 VIEW  COMPARISON:  12/05/2014  FINDINGS: Mildly low  endotracheal tube, tip 1.5 cm above the carina. Left subclavian porta catheter and right IJ central line are in unchanged position. A gastric suction tube reaches the stomach at least.  Unchanged hypoaeration with bibasilar atelectasis, more extensive on the left. Cannot exclude a trace left pleural effusion. No pneumothorax.  IMPRESSION: 1. Lower endotracheal tube, tip 1.5 cm above the carina. 2. Unchanged bibasilar atelectasis.   Electronically Signed   By: Jorje Guild M.D.   On: 12/06/2014 06:21   Dg Chest Port 1 View  12/05/2014   CLINICAL DATA:  Central line placement.  Initial encounter.  EXAM: PORTABLE CHEST - 1 VIEW  COMPARISON:  Chest radiograph performed earlier today at 11:20 a.m.  FINDINGS: The patient's endotracheal tube is seen ending 2 cm above the carina. A left-sided chest port is noted ending about the distal SVC. An enteric tube is noted extending overlying the body of the stomach. A right IJ sheath is seen ending overlying the mid SVC.  Lung expansion is mildly improved. Mild left basilar opacity may reflect atelectasis or possibly mild pneumonia. No pleural effusion or pneumothorax is seen.  The cardiomediastinal silhouette is mildly enlarged. No acute osseous abnormalities are identified.  IMPRESSION: 1. Right IJ sheath seen ending about the mid SVC. 2. Endotracheal tube noted ending 2 cm above the carina. 3. Enteric tube seen extending overlying the body of the stomach. 4. Lungs mildly hypoexpanded. Mild left basilar airspace opacity may reflect atelectasis or possibly mild pneumonia, slightly better characterized than on the prior study. 5. Mild cardiomegaly.   Electronically Signed   By: Garald Balding M.D.   On: 12/05/2014 20:43    Medications:  I have reviewed the patient's current medications. Scheduled: . therapeutic plasma exchange solution   Dialysis Once in dialysis  . amiodarone  100 mg Oral Daily  . amLODipine  10 mg Oral Daily  . antiseptic oral rinse  7 mL Mouth  Rinse QID  . calcium gluconate IVPB  4 g Intravenous Once  . chlorhexidine  15 mL Mouth Rinse BID  . feeding supplement (PRO-STAT SUGAR FREE 64)  30 mL Per Tube QID  . feeding supplement (VITAL HIGH PROTEIN)  1,000 mL Per Tube Q24H  . fentaNYL  50 mcg Intravenous Once  . free water  200 mL Per Tube 4 times per day  . heparin  1,000 Units Intracatheter Once  . insulin aspart  0-20 Units Subcutaneous 6 times per day  . methylPREDNISolone (SOLU-MEDROL) injection  40 mg Intravenous Daily  . pantoprazole (PROTONIX) IV  40 mg Intravenous Q24H  . sodium chloride  3 mL Intravenous Q12H    Assessment/Plan: Patient with MG exacerbation.  TPE X 1.  Next treatment tomorrow.  Improved from yesterday a small degree.  On steroids.  Recommendations: 1.  Patient scheduled for 4 more TPE treatments  2.  Continue Solumedrol at current dose.     LOS: 1 day   Alexis Goodell, MD [facility] [phone] 12/06/2014  10:17 AM

## 2014-12-06 NOTE — Progress Notes (Signed)
PULMONARY / CRITICAL CARE MEDICINE   Name: Craig [name] MRN: [id number] DOB: Jan 27, 1941    ADMISSION DATE:  12/05/2014 CONSULTATION DATE:  12/05/14  REFERRING MD :  Dr. Coralyn Pear  CHIEF COMPLAINT:  Respiratory Failure  INITIAL PRESENTATION: [name], never smoker, transferred to [facility] on 1/22 with acute MG exacerbation & respiratory failure.  Decompensated requiring intubation 1/22 pm.  Tx to ICU.    STUDIES:  1/22  CTA Chest >> neg for PE, bilateral lower lobe atelectasis, scarring, calcified plaque in LAD, L circ, L thyroid nodule  SIGNIFICANT EVENTS: 1/22  Admit to [facility] with acute MG exacerbation, acute respiratory failure.  Intubated pm of 1/22, tx to ICU 1/22 started plasmapheresis  SUBJECTIVE: Plasmapheresis overnight, some bradycardia, tolerating tube feedings  VITAL SIGNS: Temp:  [97.7 F (36.5 C)-99.2 F (37.3 C)] 97.9 F (36.6 C) (01/23 0400) Pulse Rate:  [51-89] 59 (01/23 0630) Resp:  [11-27] 19 (01/23 0630) BP: (89-139)/(52-75) 95/65 mmHg (01/23 0630) SpO2:  [91 %-100 %] 97 % (01/23 0630) FiO2 (%):  [50 %-100 %] 50 % (01/23 0600) Weight:  [108.4 kg (238 lb 15.7 oz)-108.7 kg (239 lb 10.2 oz)] 108.4 kg (238 lb 15.7 oz) (01/23 0415)   HEMODYNAMICS:     VENTILATOR SETTINGS: Vent Mode:  [-] PRVC FiO2 (%):  [50 %-100 %] 50 % Set Rate:  [16 bmp] 16 bmp Vt Set:  [550 mL] 550 mL PEEP:  [5 cmH20] 5 cmH20 Plateau Pressure:  [16 cmH20-20 cmH20] 17 cmH20   INTAKE / OUTPUT:  Intake/Output Summary (Last 24 hours) at 12/06/14 0700 Last data filed at 12/06/14 0600  Gross per 24 hour  Intake 876.67 ml  Output    500 ml  Net 376.67 ml    PHYSICAL EXAMINATION: General:  Sedated on vent Neuro:  Eyelids seem stronger, grip strength 4+/5 HEENT:  NCAT EOMi, ETT in place Cardiovascular:  Irreg irreg  Lungs:  Few rhonchi bilaterally Abdomen:  BS+, soft, nontender  Musculoskeletal:  No acute deformities  Skin:  Warm/dry, no edema   LABS:  CBC  Recent Labs Lab  12/05/14 2015 12/05/14 2147 12/06/14 0357  WBC 15.2*  --  15.9*  HGB 14.8 15.6 14.7  HCT 43.2 46.0 43.8  PLT 220  --  199     Coag's  Recent Labs Lab 12/05/14 2015  INR 1.24     BMET  Recent Labs Lab 12/05/14 2015 12/05/14 2147 12/06/14 0357  NA 146* 146* 149*  K 3.4* 3.3* 3.2*  CL 107 108 114*  CO2 28  --  26  BUN 26* 30* 26*  CREATININE 1.50* 1.40* 1.33  GLUCOSE 208* 203* 189*     Electrolytes  Recent Labs Lab 12/05/14 2015 12/06/14 0357  CALCIUM 9.2 9.2  MG  --  1.7  PHOS  --  2.7     Sepsis Markers No results for input(s): LATICACIDVEN, PROCALCITON, O2SATVEN in the last 168 hours.   ABG  Recent Labs Lab 12/05/14 2140  PHART 7.453*  PCO2ART 39.1  PO2ART 126.0*     Liver Enzymes  Recent Labs Lab 12/05/14 2015 12/06/14 0357  AST 42* 18  ALT 45 20  ALKPHOS 70 30*  BILITOT 0.9 0.9  ALBUMIN 3.6 4.2     Cardiac Enzymes No results for input(s): TROPONINI, PROBNP in the last 168 hours.   Glucose  Recent Labs Lab 12/05/14 2038 12/06/14 0007 12/06/14 0426  GLUCAP 212* 170* 166*    Imaging Dg Chest Port 1 View  12/05/2014  CLINICAL DATA:  Central line placement.  Initial encounter.  EXAM: PORTABLE CHEST - 1 VIEW  COMPARISON:  Chest radiograph performed earlier today at 11:20 a.m.  FINDINGS: The patient's endotracheal tube is seen ending 2 cm above the carina. A left-sided chest port is noted ending about the distal SVC. An enteric tube is noted extending overlying the body of the stomach. A right IJ sheath is seen ending overlying the mid SVC.  Lung expansion is mildly improved. Mild left basilar opacity may reflect atelectasis or possibly mild pneumonia. No pleural effusion or pneumothorax is seen.  The cardiomediastinal silhouette is mildly enlarged. No acute osseous abnormalities are identified.  IMPRESSION: 1. Right IJ sheath seen ending about the mid SVC. 2. Endotracheal tube noted ending 2 cm above the carina. 3. Enteric tube seen  extending overlying the body of the stomach. 4. Lungs mildly hypoexpanded. Mild left basilar airspace opacity may reflect atelectasis or possibly mild pneumonia, slightly better characterized than on the prior study. 5. Mild cardiomegaly.   Electronically Signed   By: Garald Balding M.D.   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 P:   RASS goal: -1 PAD 2, fentanyl gtt with daily awakenings and frequent neuro checks Plasmaphoresis per Neurology Solumedrol 40 mg QD Delirium prevention measures Lacrilube / eye care   PULMONARY OETT 1/22 >>  A: Acute Respiratory Failure - in the setting of MG exacerbation  P:   Full vent support Daily SBT Wean PEEP/FiO2 for sats > 92% PRN albuterol   Retract ETT 1.5cm 1/23 AM VAP prevention measures Will need NIF / VC once closer to extubation   CARDIOVASCULAR L CW Port >>> HD Cath 1/22 >>  A:  Hypertension  Chronic Atrial Fibrillation - on Xarelto at baseline Bradycardia 1/23 P:  Continue amiodarone 100 mg QD,  norvasc 10 mg  Hold metoprolol with bradycardia ICU monitoring  Hold home lasix, metolazone  EKG   RENAL A:  AKI ? Improving, unclear baseline Mild Hypernatremia  Hypokalemia  P:   Hold nephrotoxic agents NS at 47ml/hr  Add free water per tube Trend BMP Replace electrolytes as indicated   GASTROINTESTINAL / GU  A:   Metastatic Bladder Cancer - mets to abdominal wall  GERD P:   PPI  TF per nutrition   HEMATOLOGIC A:   Chronic Anticoagulation - Xarelto in setting of Afib P:  Hold Xarelto Heparin gtt per pharmacy after catheter placement    INFECTIOUS  A:   Leukocytosis - likely stress response vs steroids related, no evidence of infection P:    UA 1/22 >> neg  Sputum 1/22 >>   Hold abx, monitor fever curve / wbc  ENDOCRINE A:  DM   P:   Hold starlix, metformin  SSI Q4   FAMILY   - Updates: family updated at bedside - wife and son 1/22 PM  - Inter-disciplinary family meet or Palliative Care meeting due by:  1/29   CC time by me 35 minutes  Roselie Awkward, MD [facility] Pager: [phone] Cell: [phone] If no response, call [phone]

## 2014-12-06 NOTE — Progress Notes (Addendum)
ANTICOAGULATION CONSULT NOTE   Pharmacy Consult for Heparin Indication: atrial fibrillation  No Known Allergies  Patient Measurements: Height: 5\' 7"  (170.2 cm) Weight: 238 lb 15.7 oz (108.4 kg) IBW/kg (Calculated) : 66.1  Vital Signs: Temp: 98.1 F (36.7 C) (01/23 0700) Temp Source: Oral (01/23 0700) BP: 102/63 mmHg (01/23 1100) Pulse Rate: 72 (01/23 1000)  Labs:  Recent Labs  12/05/14 2015 12/05/14 2147 12/06/14 0357  HGB 14.8 15.6 14.7  HCT 43.2 46.0 43.8  PLT 220  --  199  LABPROT 15.8*  --   --   INR 1.24  --   --   CREATININE 1.50* 1.40* 1.33    Estimated Creatinine Clearance: 57.2 mL/min (by C-G formula based on Cr of 1.33).   Medical History: Past Medical History  Diagnosis Date  . Atrial fibrillation, chronic     on Xarelto   . HTN (hypertension)   . CHF (congestive heart failure)   . Diabetes mellitus   . 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 Lake Bells patient had last dose of xarelto on Wednesday.    Assessment: 74 yo M admitted 12/05/2014 with acute myasthenia gravis exacerbation and respiratory failure.  Pharmacy consulted to start heparin. Discussion had with CCM on 1/22 and 1/23 regarding clotting factor removal and TPE. Medical team feels that with ongoing afib ok to start IV heparin today. Will follow closely for thrombosis/DIC.   Hgb down slightly to 14.7, plt stable at 199.   Received first treatment of plasmapharesis 1/22, next due tomorrow 1/24 for 5 total.   PMH: MG, Afib, CHF, PNA, DM, CVA  Coag: afib  Goal of Therapy:  Heparin level 0.3-0.7 units/ml Monitor platelets by anticoagulation protocol: Yes   Plan:  To start heparin 1300 units/hr, no bolus Heparin level 8h after ggt starts. Daily heparin level and CBC.  Thank you for allowing pharmacy to be a part of this patients care team.  Erin Hearing PharmD., BCPS Clinical Pharmacist Pager [phone] 12/06/2014 12:09  PM  Initial heparin level at goal (0.36) on 1300 units/hr Continue at current rate and check level with am labs.  12/06/2014 9:44 PM

## 2014-12-06 NOTE — Progress Notes (Signed)
INITIAL NUTRITION ASSESSMENT  DOCUMENTATION CODES Per approved criteria  -Obesity Unspecified   INTERVENTION: Continue Vital High Protein @ 40 ml/hr via OGT.   30 ml Prostat QID.    Tube feeding regimen provides 1360 kcal (69% of needs), 144 grams of protein, and 806 ml of H2O.    NUTRITION DIAGNOSIS: Inadequate oral intake related to inability to eat as evidenced by NPO/Vent status.   Goal: Enteral nutrition to provide 60-70% of estimated calorie needs (22-25 kcals/kg ideal body weight) and 100% of estimated protein needs, based on ASPEN guidelines for hypocaloric, high protein feeding in critically ill obese individuals  Monitor:  Vent status, TF rate/tolerance, weight trend, labs  Reason for Assessment: Vent/ Consult for TF management  74 y.o. male  Admitting Dx: Myasthenia gravis in crisis  ASSESSMENT: [name], never smoker, transferred to [facility] on 1/22 with acute MG exacerbation & respiratory failure. Decompensated requiring intubation 1/22 pm.   Pt is currently receiving Vital High Protein at 40 ml/hr with 200 ml free water flushes QID; provides 960 kcal, 84 grams of protein and 1606 ml of water. Per nursing notes pt has had, 75 ml gastric residuals. Pt appears well-nourished.   Patient is currently intubated on ventilator support MV: 7.7 L/min Temp (24hrs), Avg:98.1 F (36.7 C), Min:97.7 F (36.5 C), Max:99.2 F (37.3 C)  Propofol: none  Labs: high sodium, low potassium, low GFR  Height: Ht Readings from Last 1 Encounters:  12/05/14 5\' 7"  (1.702 m)    Weight: Wt Readings from Last 1 Encounters:  12/06/14 238 lb 15.7 oz (108.4 kg)    Ideal Body Weight: 148 lbs  % Ideal Body Weight: 161%  Wt Readings from Last 10 Encounters:  12/06/14 238 lb 15.7 oz (108.4 kg)  05/03/10 290 lb (131.543 kg)    Usual Body Weight: unknown  % Usual Body Weight: NA  BMI:  Body mass index is 37.42 kg/(m^2).  Estimated Nutritional Needs: Kcal: 1972 (Underfeeding  Goal: 1601-0932) Protein: >/=135 grams Fluid: 2.8 L/day  Skin: intact  Diet Order: Diet NPO time specified  EDUCATION NEEDS: -No education needs identified at this time   Intake/Output Summary (Last 24 hours) at 12/06/14 0917 Last data filed at 12/06/14 0800  Gross per 24 hour  Intake 1076.67 ml  Output    500 ml  Net 576.67 ml    Last BM: PTA  Labs:   Recent Labs Lab 12/05/14 2015 12/05/14 2147 12/06/14 0357  NA 146* 146* 149*  K 3.4* 3.3* 3.2*  CL 107 108 114*  CO2 28  --  26  BUN 26* 30* 26*  CREATININE 1.50* 1.40* 1.33  CALCIUM 9.2  --  9.2  MG  --   --  1.7  PHOS  --   --  2.7  GLUCOSE 208* 203* 189*    CBG (last 3)   Recent Labs  12/06/14 0007 12/06/14 0426 12/06/14 0716  GLUCAP 170* 166* 165*    Scheduled Meds: . therapeutic plasma exchange solution   Dialysis Once in dialysis  . amiodarone  100 mg Oral Daily  . amLODipine  10 mg Oral Daily  . antiseptic oral rinse  7 mL Mouth Rinse QID  . calcium gluconate IVPB  4 g Intravenous Once  . chlorhexidine  15 mL Mouth Rinse BID  . feeding supplement (VITAL HIGH PROTEIN)  1,000 mL Per Tube Q24H  . fentaNYL  50 mcg Intravenous Once  . free water  200 mL Per Tube 4 times per  day  . heparin  1,000 Units Intracatheter Once  . insulin aspart  0-20 Units Subcutaneous 6 times per day  . methylPREDNISolone (SOLU-MEDROL) injection  40 mg Intravenous Daily  . pantoprazole (PROTONIX) IV  40 mg Intravenous Q24H  . sodium chloride  3 mL Intravenous Q12H    Continuous Infusions: . sodium chloride 50 mL/hr at 12/06/14 0700  . citrate dextrose    . fentaNYL infusion INTRAVENOUS Stopped (12/06/14 0630)    Past Medical History  Diagnosis Date  . Atrial fibrillation, chronic     on Xarelto   . HTN (hypertension)   . CHF (congestive heart failure)   . Diabetes mellitus   . Bladder cancer metastasized to intra-abdominal lymph nodes     abdominal wall metastases     Past Surgical History  Procedure  Laterality Date  . Abdominal wall metastasis removal      Pryor Ochoa RD, LDN Inpatient Clinical Dietitian Pager: [phone] After Hours Pager: [phone]

## 2014-12-06 NOTE — Progress Notes (Signed)
Pt to receive Albumin tomorrow 12/07/14 for MG crisis per Dr. Shanon Rosser MD, Neurology.

## 2014-12-06 NOTE — Plan of Care (Signed)
Pt wishes for his sedation to be restarted as he is uncomfortable, fentanyl restarted at 11mcg.

## 2014-12-07 ENCOUNTER — Inpatient Hospital Stay (HOSPITAL_COMMUNITY): Payer: PPO

## 2014-12-07 LAB — GLUCOSE, CAPILLARY
GLUCOSE-CAPILLARY: 166 mg/dL — AB (ref 70–99)
GLUCOSE-CAPILLARY: 197 mg/dL — AB (ref 70–99)
Glucose-Capillary: 131 mg/dL — ABNORMAL HIGH (ref 70–99)
Glucose-Capillary: 160 mg/dL — ABNORMAL HIGH (ref 70–99)
Glucose-Capillary: 152 mg/dL — ABNORMAL HIGH (ref 70–99)

## 2014-12-07 LAB — CBC WITH DIFFERENTIAL/PLATELET
BASOS ABS: 0 10*3/uL (ref 0.0–0.1)
Basophils Relative: 0 % (ref 0–1)
Eosinophils Absolute: 0 10*3/uL (ref 0.0–0.7)
Eosinophils Relative: 0 % (ref 0–5)
HCT: 38.9 % — ABNORMAL LOW (ref 39.0–52.0)
Hemoglobin: 13.2 g/dL (ref 13.0–17.0)
LYMPHS ABS: 1.4 10*3/uL (ref 0.7–4.0)
Lymphocytes Relative: 8 % — ABNORMAL LOW (ref 12–46)
MCH: 31.6 pg (ref 26.0–34.0)
MCHC: 33.9 g/dL (ref 30.0–36.0)
MCV: 93.1 fL (ref 78.0–100.0)
MONO ABS: 1.3 10*3/uL — AB (ref 0.1–1.0)
MONOS PCT: 7 % (ref 3–12)
NEUTROS PCT: 85 % — AB (ref 43–77)
Neutro Abs: 15.7 10*3/uL — ABNORMAL HIGH (ref 1.7–7.7)
Platelets: 161 10*3/uL (ref 150–400)
RBC: 4.18 MIL/uL — AB (ref 4.22–5.81)
RDW: 13 % (ref 11.5–15.5)
WBC: 18.4 10*3/uL — ABNORMAL HIGH (ref 4.0–10.5)

## 2014-12-07 LAB — POCT I-STAT, CHEM 8
BUN: 36 mg/dL — ABNORMAL HIGH (ref 6–23)
CALCIUM ION: 1.37 mmol/L — AB (ref 1.13–1.30)
CREATININE: 1 mg/dL (ref 0.50–1.35)
Chloride: 112 mmol/L (ref 96–112)
Glucose, Bld: 165 mg/dL — ABNORMAL HIGH (ref 70–99)
HCT: 44 % (ref 39.0–52.0)
HEMOGLOBIN: 15 g/dL (ref 13.0–17.0)
Potassium: 3.9 mmol/L (ref 3.5–5.1)
Sodium: 152 mmol/L — ABNORMAL HIGH (ref 135–145)
TCO2: 25 mmol/L (ref 0–100)

## 2014-12-07 LAB — BASIC METABOLIC PANEL
Anion gap: 7 (ref 5–15)
BUN: 34 mg/dL — ABNORMAL HIGH (ref 6–23)
CO2: 25 mmol/L (ref 19–32)
Calcium: 9 mg/dL (ref 8.4–10.5)
Chloride: 117 mmol/L — ABNORMAL HIGH (ref 96–112)
Creatinine, Ser: 1.06 mg/dL (ref 0.50–1.35)
GFR calc Af Amer: 78 mL/min — ABNORMAL LOW (ref >90)
GFR, EST NON AFRICAN AMERICAN: 67 mL/min — AB (ref >90)
GLUCOSE: 151 mg/dL — AB (ref 70–99)
Potassium: 3.5 mmol/L (ref 3.5–5.1)
Sodium: 149 mmol/L — ABNORMAL HIGH (ref 135–145)

## 2014-12-07 LAB — HEPARIN LEVEL (UNFRACTIONATED): Heparin Unfractionated: 0.47 IU/mL (ref 0.30–0.70)

## 2014-12-07 MED ORDER — DIPHENHYDRAMINE HCL 25 MG PO CAPS: 25.0000 mg | ORAL_CAPSULE | Freq: Four times a day (QID) | ORAL | Status: DC | PRN

## 2014-12-07 MED ORDER — ANTICOAGULANT SODIUM CITRATE 4% (200MG/5ML) IV SOLN
5.0000 mL | Freq: Once | Status: AC
  Administered 2014-12-07: 5 mL
  Filled 2014-12-07: qty 250

## 2014-12-07 MED ORDER — SODIUM CHLORIDE 0.9 % IV SOLN
4.0000 g | Freq: Once | INTRAVENOUS | Status: AC
  Administered 2014-12-07: 4 g via INTRAVENOUS
  Filled 2014-12-07 (×2): qty 40

## 2014-12-07 MED ORDER — ACD FORMULA A 0.73-2.45-2.2 GM/100ML VI SOLN: 500.0000 mL | Status: DC

## 2014-12-07 MED ORDER — ACD FORMULA A 0.73-2.45-2.2 GM/100ML VI SOLN
Status: AC
  Administered 2014-12-07: 13:00:00
  Filled 2014-12-07: qty 500

## 2014-12-07 MED ORDER — ALBUMIN HUMAN 25 % IV SOLN
INTRAVENOUS | Status: AC
  Administered 2014-12-07 (×3): via INTRAVENOUS_CENTRAL
  Filled 2014-12-07 (×4): qty 200

## 2014-12-07 MED ORDER — ANTICOAGULANT SODIUM CITRATE 4% (200MG/5ML) IV SOLN
5.0000 mL | Freq: Once | Status: DC
  Filled 2014-12-07: qty 250

## 2014-12-07 MED ORDER — SODIUM CHLORIDE 0.9 % IV SOLN
4.0000 g | Freq: Once | INTRAVENOUS | Status: DC
  Filled 2014-12-07: qty 40

## 2014-12-07 MED ORDER — SODIUM CHLORIDE 0.9 % IV SOLN: Freq: Once | INTRAVENOUS | Status: DC

## 2014-12-07 MED ORDER — FREE WATER
200.0000 mL | Status: DC
  Administered 2014-12-07 – 2014-12-10 (×23): 200 mL

## 2014-12-07 MED ORDER — POTASSIUM CHLORIDE 20 MEQ/15ML (10%) PO SOLN
20.0000 meq | ORAL | Status: AC
  Administered 2014-12-07 (×2): 20 meq
  Filled 2014-12-07 (×2): qty 15

## 2014-12-07 MED ORDER — ACETAMINOPHEN 325 MG PO TABS: 650.0000 mg | ORAL_TABLET | ORAL | Status: DC | PRN

## 2014-12-07 MED ORDER — SODIUM CHLORIDE 0.9 % IV SOLN
INTRAVENOUS | Status: DC
  Administered 2014-12-07 – 2014-12-11 (×4): via INTRAVENOUS

## 2014-12-07 NOTE — Progress Notes (Signed)
Utilization review completed.  

## 2014-12-07 NOTE — Progress Notes (Signed)
ET Tube retracted 3cm per Radiologist. Good BBS noted on auscultation.

## 2014-12-07 NOTE — Progress Notes (Signed)
CCM MD contacted and relayed that Radiologist had instructed RN that Pt's ETT was advanced into the Rt mainstem bronchus and advised to be retracted 3cm which was carried out per RRT.

## 2014-12-07 NOTE — Progress Notes (Signed)
ANTICOAGULATION CONSULT NOTE   Pharmacy Consult for Heparin Indication: atrial fibrillation  No Known Allergies  Patient Measurements: Height: 5\' 7"  (170.2 cm) Weight: 240 lb 3.2 oz (108.954 kg) IBW/kg (Calculated) : 66.1  Vital Signs: Temp: 97.8 F (36.6 C) (01/24 1435) Temp Source: Axillary (01/24 1435) BP: 112/68 mmHg (01/24 1435) Pulse Rate: 86 (01/24 1435)  Labs:  Recent Labs  12/05/14 2015  12/06/14 0357 12/06/14 2100 12/07/14 0400 12/07/14 1248  HGB 14.8  < > 14.7  --  13.2 15.0  HCT 43.2  < > 43.8  --  38.9* 44.0  PLT 220  --  199  --  161  --   LABPROT 15.8*  --   --   --   --   --   INR 1.24  --   --   --   --   --   HEPARINUNFRC  --   --   --  0.36 0.47  --   CREATININE 1.50*  < > 1.33  --  1.06 1.00  < > = values in this interval not displayed.  Estimated Creatinine Clearance: 76.4 mL/min (by C-G formula based on Cr of 1).   Medical History: Past Medical History  Diagnosis Date  . Atrial fibrillation, chronic     on Xarelto   . HTN (hypertension)   . CHF (congestive heart failure)   . Diabetes mellitus   . 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 [place] Bells patient had last dose of xarelto on Wednesday.    Assessment: 74 yo M admitted 12/05/2014 with acute myasthenia gravis exacerbation and respiratory failure.  Pharmacy consulted to start heparin. Discussion had with CCM on 1/22 and 1/23 regarding clotting factor removal and TPE was ok to start heparin.  1/24: hematuria and oozing from HD cath site noted this morning. Orders to hold heparin today and will follow up anticoagulation plan tomorrow. HL was at goal 0.4 on 1300 units/hr.  Hgb down slightly to 13.2, plt stable at 161.   To receive #2/5 TPE treatment today.   Goal of Therapy:  Heparin level 0.3-0.7 units/ml Monitor platelets by anticoagulation protocol: Yes   Plan:  Hold heparin today Follow up plan 1/25 Follow for further  s/s of bleeding  Thank you for allowing pharmacy to be a part of this patients care team.  Erin Hearing PharmD., BCPS Clinical Pharmacist Pager [phone] 12/07/2014 3:12 PM

## 2014-12-07 NOTE — Progress Notes (Signed)
Subjective: Patient awake and alert.  Essentially unchanged otherwise.  Will receive second TPE treatment today.    Objective: Current vital signs: BP 129/67 mmHg  Pulse 85  Temp(Src) 98.5 F (36.9 C) (Axillary)  Resp 14  Ht 5\' 7"  (1.702 m)  Wt 108.954 kg (240 lb 3.2 oz)  BMI 37.61 kg/m2  SpO2 93% Vital signs in last 24 hours: Temp:  [98.3 F (36.8 C)-98.6 F (37 C)] 98.5 F (36.9 C) (01/24 1101) Pulse Rate:  [57-86] 85 (01/24 1200) Resp:  [10-22] 14 (01/24 1200) BP: (94-129)/(53-70) 129/67 mmHg (01/24 1200) SpO2:  [91 %-97 %] 93 % (01/24 1200) FiO2 (%):  [40 %-50 %] 40 % (01/24 1101) Weight:  [108.954 kg (240 lb 3.2 oz)] 108.954 kg (240 lb 3.2 oz) (01/24 0500)  Intake/Output from previous day: 01/23 0701 - 01/24 0700 In: 3634 [I.V.:1514; NG/GT:2120] Out: 1285 [Urine:1285] Intake/Output this shift: Total I/O In: 783 [I.V.:103; NG/GT:680] Out: 700 [Urine:700] Nutritional status: Diet NPO time specified  Neurologic Exam: Mental Status: Awake and alert. Intubated. Able to follow simple commands. Cranial Nerves: II: Discs flat bilaterally; Blinks to bilateral confrontation, pupils equal, round, reactive to light and accommodation III,IV, VI: Left ptosis improved. Right ptosis unchanged. Unable to move the right eye in any direction. Minimal movement laterally of the left eye but full excursion not present. V,VII: Unable to smile, facial light touch sensation normal bilaterally VIII: hearing normal bilaterally IX,X: not tested XI: bilateral shoulder shrug XII: not tested Motor: Hand grip 5/5. Flexion and extension present at the elbows. Unable to lift either arm off the bed at the shoulder.  5/5 in bilateral lower extremities Sensory: Pinprick and light touch intact throughout, bilaterally Deep Tendon Reflexes: 2+ on the right, 1+ on the LUE, trace left KJ and absent left AJ Plantars: Right: muteLeft: mute   Lab Results: Basic  Metabolic Panel:  Recent Labs Lab 12/05/14 2015 12/05/14 2147 12/06/14 0357 12/07/14 0400  NA 146* 146* 149* 149*  K 3.4* 3.3* 3.2* 3.5  CL 107 108 114* 117*  CO2 28  --  26 25  GLUCOSE 208* 203* 189* 151*  BUN 26* 30* 26* 34*  CREATININE 1.50* 1.40* 1.33 1.06  CALCIUM 9.2  --  9.2 9.0  MG  --   --  1.7  --   PHOS  --   --  2.7  --     Liver Function Tests:  Recent Labs Lab 12/05/14 2015 12/06/14 0357  AST 42* 18  ALT 45 20  ALKPHOS 70 30*  BILITOT 0.9 0.9  PROT 6.8 5.4*  ALBUMIN 3.6 4.2   No results for input(s): LIPASE, AMYLASE in the last 168 hours. No results for input(s): AMMONIA in the last 168 hours.  CBC:  Recent Labs Lab 12/05/14 2015 12/05/14 2147 12/06/14 0357 12/07/14 0400  WBC 15.2*  --  15.9* 18.4*  NEUTROABS  --   --   --  15.7*  HGB 14.8 15.6 14.7 13.2  HCT 43.2 46.0 43.8 38.9*  MCV 92.5  --  93.0 93.1  PLT 220  --  199 161    Cardiac Enzymes: No results for input(s): CKTOTAL, CKMB, CKMBINDEX, TROPONINI in the last 168 hours.  Lipid Panel: No results for input(s): CHOL, TRIG, HDL, CHOLHDL, VLDL, LDLCALC in the last 168 hours.  CBG:  Recent Labs Lab 12/06/14 1618 12/06/14 1928 12/07/14 0007 12/07/14 0437 12/07/14 0805  GLUCAP 199* 172* 166* 131* 160*    Microbiology: Results for orders placed or  performed during the hospital encounter of 12/05/14  MRSA PCR Screening     Status: None   Collection Time: 12/05/14  6:01 PM  Result Value Ref Range Status   MRSA by PCR NEGATIVE NEGATIVE Final    Comment:        The GeneXpert MRSA Assay (FDA approved for NASAL specimens only), is one component of a comprehensive MRSA colonization surveillance program. It is not intended to diagnose MRSA infection nor to guide or monitor treatment for MRSA infections.   Culture, respiratory (NON-Expectorated)     Status: None (Preliminary result)   Collection Time: 12/06/14  3:55 AM  Result Value Ref Range Status   Specimen Description  TRACHEAL ASPIRATE  Final   Special Requests Immunocompromised  Final   Gram Stain   Final    MODERATE WBC PRESENT, PREDOMINANTLY PMN FEW SQUAMOUS EPITHELIAL CELLS PRESENT MODERATE GRAM POSITIVE COCCI IN PAIRS IN CHAINS Performed at [facility]    Culture   Final    Culture reincubated for better growth Performed at [facility]    Report Status PENDING  Incomplete    Coagulation Studies:  Recent Labs  12/05/14 2015  LABPROT 15.8*  INR 1.24    Imaging: Dg Chest Port 1 View  12/07/2014   CLINICAL DATA:  Acute respiratory failure with hypoxia  EXAM: PORTABLE CHEST - 1 VIEW  COMPARISON:  12/06/2014  FINDINGS: Endotracheal tube with tip at the carina or right mainstem bronchus orifice. Left subclavian porta catheter and right IJ catheter are in stable position. Gastric suction tube continues into the stomach.  Worsening retrocardiac aeration, likely atelectasis. No edema, effusion, or pneumothorax. Heart size and mediastinal contours are stable.  These results were called by telephone at the time of interpretation on 12/07/2014 at 6:21 am to [place] , who verbally acknowledged these results.  IMPRESSION: 1. Endotracheal tube tip at the carina or proximal right mainstem. Recommend retraction by 3 cm. 2. New retrocardiac atelectasis, possibly exacerbated by #1.   Electronically Signed   By: Jorje Guild M.D.   On: 12/07/2014 06:21   Dg Chest Port 1 View  12/06/2014   CLINICAL DATA:  Acute respiratory failure  EXAM: PORTABLE CHEST - 1 VIEW  COMPARISON:  12/05/2014  FINDINGS: Mildly low endotracheal tube, tip 1.5 cm above the carina. Left subclavian porta catheter and right IJ central line are in unchanged position. A gastric suction tube reaches the stomach at least.  Unchanged hypoaeration with bibasilar atelectasis, more extensive on the left. Cannot exclude a trace left pleural effusion. No pneumothorax.  IMPRESSION: 1. Lower endotracheal tube, tip 1.5 cm  above the carina. 2. Unchanged bibasilar atelectasis.   Electronically Signed   By: Jorje Guild M.D.   On: 12/06/2014 06:21   Dg Chest Port 1 View  12/05/2014   CLINICAL DATA:  Central line placement.  Initial encounter.  EXAM: PORTABLE CHEST - 1 VIEW  COMPARISON:  Chest radiograph performed earlier today at 11:20 a.m.  FINDINGS: The patient's endotracheal tube is seen ending 2 cm above the carina. A left-sided chest port is noted ending about the distal SVC. An enteric tube is noted extending overlying the body of the stomach. A right IJ sheath is seen ending overlying the mid SVC.  Lung expansion is mildly improved. Mild left basilar opacity may reflect atelectasis or possibly mild pneumonia. No pleural effusion or pneumothorax is seen.  The cardiomediastinal silhouette is mildly enlarged. No acute osseous abnormalities are identified.  IMPRESSION:  1. Right IJ sheath seen ending about the mid SVC. 2. Endotracheal tube noted ending 2 cm above the carina. 3. Enteric tube seen extending overlying the body of the stomach. 4. Lungs mildly hypoexpanded. Mild left basilar airspace opacity may reflect atelectasis or possibly mild pneumonia, slightly better characterized than on the prior study. 5. Mild cardiomegaly.   Electronically Signed   By: Garald Balding M.D.   On: 12/05/2014 20:43    Medications:  I have reviewed the patient's current medications. Scheduled: . amiodarone  100 mg Oral Daily  . amLODipine  10 mg Oral Daily  . anticoagulant sodium citrate  5 mL Intracatheter Once  . antiseptic oral rinse  7 mL Mouth Rinse QID  . calcium gluconate IVPB  4 g Intravenous Once  . chlorhexidine  15 mL Mouth Rinse BID  . citrate dextrose      . feeding supplement (PRO-STAT SUGAR FREE 64)  30 mL Per Tube QID  . feeding supplement (VITAL HIGH PROTEIN)  1,000 mL Per Tube Q24H  . fentaNYL  50 mcg Intravenous Once  . free water  200 mL Per Tube Q3H  . insulin aspart  0-20 Units Subcutaneous 6 times per  day  . methylPREDNISolone (SOLU-MEDROL) injection  60 mg Intravenous Daily  . pantoprazole (PROTONIX) IV  40 mg Intravenous Q24H  . sodium chloride  10-40 mL Intracatheter Q12H    Assessment/Plan: No improvement noted.  Patient for 2 of 5 TPE today.    Recommendations: 1.  Continue steroids 2.  Continue TPE QOD   LOS: 2 days   Alexis Goodell, MD [facility] [phone] 12/07/2014  12:36 PM

## 2014-12-07 NOTE — Progress Notes (Signed)
PULMONARY / CRITICAL CARE MEDICINE   Name: Craig [name] MRN: [id number] DOB: [birth year]    ADMISSION DATE:  12/05/2014 CONSULTATION DATE:  12/05/14  REFERRING MD :  Dr. Coralyn Pear  CHIEF COMPLAINT:  Respiratory Failure  INITIAL PRESENTATION: [name], never smoker, transferred to [facility] on 1/22 with acute MG exacerbation & respiratory failure.  Decompensated requiring intubation 1/22 pm.  Tx to ICU.    STUDIES:  1/22  CTA Chest >> neg for PE, bilateral lower lobe atelectasis, scarring, calcified plaque in LAD, L circ, L thyroid nodule  SIGNIFICANT EVENTS: 1/22  Admit to [facility] with acute MG exacerbation, acute respiratory failure.  Intubated pm of 1/22, tx to ICU 1/22 started plasmapheresis 1/23 PSV yesterday 7 hours, tired out, oozing from R IJ and blood tinged urine  SUBJECTIVE: PSV yesterday 7 hours, tired out, oozing from R IJ and blood tinged urine  VITAL SIGNS: Temp:  [97.5 F (36.4 C)-98.6 F (37 C)] 98.3 F (36.8 C) (01/24 0800) Pulse Rate:  [57-86] 79 (01/24 0814) Resp:  [14-22] 19 (01/24 0814) BP: (94-122)/(52-70) 107/68 mmHg (01/24 0814) SpO2:  [91 %-97 %] 96 % (01/24 0814) FiO2 (%):  [40 %-50 %] 40 % (01/24 0814) Weight:  [108.954 kg (240 lb 3.2 oz)] 108.954 kg (240 lb 3.2 oz) (01/24 0500)   HEMODYNAMICS:     VENTILATOR SETTINGS: Vent Mode:  [-] PSV FiO2 (%):  [40 %-50 %] 40 % Set Rate:  [16 bmp] 16 bmp Vt Set:  [550 mL] 550 mL PEEP:  [5 cmH20] 5 cmH20 Pressure Support:  [5 [id number] cmH20] 5 cmH20 Plateau Pressure:  [16 cmH20-22 cmH20] 16 cmH20   INTAKE / OUTPUT:  Intake/Output Summary (Last 24 hours) at 12/07/14 0846 Last data filed at 12/07/14 0800  Gross per 24 hour  Intake   3587 ml  Output   1435 ml  Net   2152 ml    PHYSICAL EXAMINATION: General:  Sedated on vent Neuro:  Still weak, ptosis bilaterally HEENT:  NCAT EOMi, ETT in place Cardiovascular:  Irreg irreg  Lungs:  Few rhonchi bilaterally Abdomen:  BS+, soft, nontender  Musculoskeletal:  No  acute deformities  Skin:  Warm/dry, no edema   LABS:  CBC  Recent Labs Lab 12/05/14 2015 12/05/14 2147 12/06/14 0357 12/07/14 0400  WBC 15.2*  --  15.9* 18.4*  HGB 14.8 15.6 14.7 13.2  HCT 43.2 46.0 43.8 38.9*  PLT 220  --  199 161     Coag's  Recent Labs Lab 12/05/14 2015  INR 1.24     BMET  Recent Labs Lab 12/05/14 2015 12/05/14 2147 12/06/14 0357 12/07/14 0400  NA 146* 146* 149* 149*  K 3.4* 3.3* 3.2* 3.5  CL 107 108 114* 117*  CO2 28  --  26 25  BUN 26* 30* 26* 34*  CREATININE 1.50* 1.40* 1.33 1.06  GLUCOSE 208* 203* 189* 151*     Electrolytes  Recent Labs Lab 12/05/14 2015 12/06/14 0357 12/07/14 0400  CALCIUM 9.2 9.2 9.0  MG  --  1.7  --   PHOS  --  2.7  --      Sepsis Markers No results for input(s): LATICACIDVEN, PROCALCITON, O2SATVEN in the last 168 hours.   ABG  Recent Labs Lab 12/05/14 2140  PHART 7.453*  PCO2ART 39.1  PO2ART 126.0*     Liver Enzymes  Recent Labs Lab 12/05/14 2015 12/06/14 0357  AST 42* 18  ALT 45 20  ALKPHOS 70 30*  BILITOT 0.9 0.9  ALBUMIN 3.6 4.2     Cardiac Enzymes No results for input(s): TROPONINI, PROBNP in the last 168 hours.   Glucose  Recent Labs Lab 12/06/14 1123 12/06/14 1618 12/06/14 1928 12/07/14 0007 12/07/14 0437 12/07/14 0805  GLUCAP 166* 199* 172* 166* 131* 160*    Imaging Dg Chest Port 1 View  12/06/2014   CLINICAL DATA:  Acute respiratory failure  EXAM: PORTABLE CHEST - 1 VIEW  COMPARISON:  12/05/2014  FINDINGS: Mildly low endotracheal tube, tip 1.5 cm above the carina. Left subclavian porta catheter and right IJ central line are in unchanged position. A gastric suction tube reaches the stomach at least.  Unchanged hypoaeration with bibasilar atelectasis, more extensive on the left. Cannot exclude a trace left pleural effusion. No pneumothorax.  IMPRESSION: 1. Lower endotracheal tube, tip 1.5 cm above the carina. 2. Unchanged bibasilar atelectasis.   Electronically Signed    By: Jorje Guild M.D.   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 P:   RASS goal: 0 PAD 2, fentanyl gtt with daily awakenings and frequent neuro checks Plasmaphoresis per Neurology Solumedrol 40 mg QD Delirium prevention measures Lacrilube / eye care   PULMONARY OETT 1/22 >>  A: Acute Respiratory Failure - in the setting of MG exacerbation  P:   Full vent support Daily SBT Wean PEEP/FiO2 for sats > 92% PRN albuterol   Retract ETT 3cm 1/24 AM VAP prevention measures Will need NIF / VC once closer to extubation   CARDIOVASCULAR L CW Port >>> HD Cath 1/22 >>  A:  Hypertension  Chronic Atrial Fibrillation - on Xarelto at baseline Bradycardia 1/23 P:  Continue amiodarone 100 mg QD,  norvasc 10 mg  Hold metoprolol with bradycardia ICU monitoring  Hold home lasix, metolazone  EKG   RENAL A:  AKI ? 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  GERD P:   PPI  TF per nutrition   HEMATOLOGIC A:   Chronic Anticoagulation - Xarelto in setting of Afib P:  Hold Xarelto Heparin gtt per pharmacy after catheter placement > hold 1/24 for oozing from HD cath/blood in urine    INFECTIOUS  A:   Leukocytosis - likely steroids related, no evidence of infection P:    UA 1/22 >> neg  Sputum 1/22 >>   Hold abx, monitor fever curve / wbc  ENDOCRINE A:  DM   P:   Hold starlix, metformin  SSI Q4   FAMILY  - Updates: family updated at bedside - wife and daughter 1/24 PM  - Inter-disciplinary family meet or Palliative Care meeting due by:  1/29   CC time by me 35 minutes  Roselie Awkward, MD [place] [facility] Pager: [phone] Cell:  [phone] If no response, call [phone]

## 2014-12-08 ENCOUNTER — Ambulatory Visit: Payer: Self-pay | Admitting: Neurology

## 2014-12-08 ENCOUNTER — Inpatient Hospital Stay (HOSPITAL_COMMUNITY): Payer: PPO

## 2014-12-08 LAB — CBC
HCT: 42.5 % (ref 39.0–52.0)
Hemoglobin: 14.2 g/dL (ref 13.0–17.0)
MCH: 31.8 pg (ref 26.0–34.0)
MCHC: 33.4 g/dL (ref 30.0–36.0)
MCV: 95.1 fL (ref 78.0–100.0)
PLATELETS: 109 10*3/uL — AB (ref 150–400)
RBC: 4.47 MIL/uL (ref 4.22–5.81)
RDW: 13 % (ref 11.5–15.5)
WBC: 15.6 10*3/uL — ABNORMAL HIGH (ref 4.0–10.5)

## 2014-12-08 LAB — GLUCOSE, CAPILLARY
GLUCOSE-CAPILLARY: 125 mg/dL — AB (ref 70–99)
GLUCOSE-CAPILLARY: 129 mg/dL — AB (ref 70–99)
GLUCOSE-CAPILLARY: 137 mg/dL — AB (ref 70–99)
GLUCOSE-CAPILLARY: 137 mg/dL — AB (ref 70–99)
GLUCOSE-CAPILLARY: 148 mg/dL — AB (ref 70–99)
GLUCOSE-CAPILLARY: 164 mg/dL — AB (ref 70–99)
Glucose-Capillary: 181 mg/dL — ABNORMAL HIGH (ref 70–99)
Glucose-Capillary: 128 mg/dL — ABNORMAL HIGH (ref 70–99)

## 2014-12-08 LAB — CULTURE, RESPIRATORY

## 2014-12-08 LAB — BASIC METABOLIC PANEL
ANION GAP: 4 — AB (ref 5–15)
BUN: 37 mg/dL — ABNORMAL HIGH (ref 6–23)
CO2: 29 mmol/L (ref 19–32)
Calcium: 9.1 mg/dL (ref 8.4–10.5)
Chloride: 117 mmol/L — ABNORMAL HIGH (ref 96–112)
Creatinine, Ser: 0.87 mg/dL (ref 0.50–1.35)
GFR calc non Af Amer: 83 mL/min — ABNORMAL LOW (ref >90)
GLUCOSE: 150 mg/dL — AB (ref 70–99)
Potassium: 4 mmol/L (ref 3.5–5.1)
SODIUM: 150 mmol/L — AB (ref 135–145)

## 2014-12-08 LAB — CULTURE, RESPIRATORY W GRAM STAIN

## 2014-12-08 MED ORDER — AMIODARONE HCL 100 MG PO TABS
100.0000 mg | ORAL_TABLET | Freq: Every day | ORAL | Status: DC
  Administered 2014-12-09 – 2014-12-17 (×9): 100 mg
  Filled 2014-12-08 (×9): qty 1

## 2014-12-08 MED ORDER — AMLODIPINE BESYLATE 5 MG PO TABS
5.0000 mg | ORAL_TABLET | Freq: Every day | ORAL | Status: DC
  Administered 2014-12-10 – 2014-12-17 (×8): 5 mg
  Filled 2014-12-08 (×9): qty 1

## 2014-12-08 MED ORDER — RIVAROXABAN 20 MG PO TABS
20.0000 mg | ORAL_TABLET | Freq: Every day | ORAL | Status: DC
  Administered 2014-12-08 – 2014-12-16 (×9): 20 mg via ORAL
  Filled 2014-12-08 (×10): qty 1

## 2014-12-08 MED ORDER — ACETAMINOPHEN 160 MG/5ML PO SOLN
650.0000 mg | Freq: Four times a day (QID) | ORAL | Status: DC | PRN
  Administered 2014-12-08: 650 mg via ORAL
  Filled 2014-12-08: qty 20.3

## 2014-12-08 MED ORDER — FENTANYL CITRATE 0.05 MG/ML IJ SOLN
12.5000 ug | INTRAMUSCULAR | Status: DC | PRN
  Administered 2014-12-08 (×2): 12.5 ug via INTRAVENOUS
  Administered 2014-12-09 – 2014-12-15 (×10): 25 ug via INTRAVENOUS
  Filled 2014-12-08 (×12): qty 2

## 2014-12-08 MED ORDER — PANTOPRAZOLE SODIUM 40 MG PO PACK
40.0000 mg | PACK | Freq: Every day | ORAL | Status: DC
  Administered 2014-12-08 – 2014-12-13 (×6): 40 mg
  Filled 2014-12-08 (×7): qty 20

## 2014-12-08 NOTE — Plan of Care (Signed)
Problem: Phase II Progression Outcomes Goal: Date pt extubated/weaned off vent Outcome: Completed/Met Date Met:  12/08/14 Pt extubated 1/25 @ 1032 by RT.  Goal: Time pt extubated/weaned off vent Outcome: Completed/Met Date Met:  12/08/14 Pt extubated 1/25 @ 1032 by RT.

## 2014-12-08 NOTE — Progress Notes (Signed)
PULMONARY / CRITICAL CARE MEDICINE   Name: Craig [name] MRN: [id number] DOB: Jan 17, 1941    ADMISSION DATE:  12/05/2014 CONSULTATION DATE:  12/05/14  REFERRING MD :  Dr. Coralyn Pear  CHIEF COMPLAINT:  Respiratory Failure  INITIAL PRESENTATION: [name], never smoker, transferred to [facility] on 1/22 with acute MG exacerbation & respiratory failure.  Decompensated requiring intubation 1/22 pm.  Tx to ICU.    STUDIES:  1/22  CTA Chest >> neg for PE, bilateral lower lobe atelectasis, scarring, calcified plaque in LAD, L circ, L thyroid nodule  SIGNIFICANT EVENTS: 1/22  Admit to [facility] with acute MG exacerbation, acute respiratory failure.  Intubated pm of 1/22, tx to ICU 1/22 started plasmapheresis 1/23 PSV yesterday 7 hours, tired out, oozing from R IJ and blood tinged urine 1/25 Extubated. Tolerating initially  SUBJECTIVE:  Passed SBT. Spontaneous Vt on PS 5 cm H2O was 500-600 cc. Spont Vt on no PEEP or PS was in 400 cc range. VC around 800 cc. Cough adequate  VITAL SIGNS: Temp:  [97.8 F (36.6 C)-98.7 F (37.1 C)] 97.8 F (36.6 C) (01/25 1239) Pulse Rate:  [30-86] 76 (01/25 1400) Resp:  [9-22] 9 (01/25 1400) BP: (94-126)/(60-86) 118/71 mmHg (01/25 1400) SpO2:  [94 %-99 %] 94 % (01/25 1400) FiO2 (%):  [40 %] 40 % (01/25 1004) Weight:  [107.956 kg (238 lb)] 107.956 kg (238 lb) (01/25 0600)   HEMODYNAMICS:     VENTILATOR SETTINGS: Vent Mode:  [-] PSV;CPAP FiO2 (%):  [40 %] 40 % Set Rate:  [16 bmp] 16 bmp Vt Set:  [550 mL] 550 mL PEEP:  [5 cmH20] 5 cmH20 Pressure Support:  [5 YBO17-51 cmH20] 5 cmH20 Plateau Pressure:  [8 cmH20-9 cmH20] 8 cmH20   INTAKE / OUTPUT:  Intake/Output Summary (Last 24 hours) at 12/08/14 1419 Last data filed at 12/08/14 1300  Gross per 24 hour  Intake   2080 ml  Output   2660 ml  Net   -580 ml    PHYSICAL EXAMINATION: General: NAD Neuro: R lid lag, bilateral grip strength seems fully intact HEENT: NCAT Cardiovascular: IRIR, rate controlled Lungs:   Few rhonchi bilaterally Abdomen:  BS+, soft, nontender  Ext: no edema  LABS: I have reviewed all of today's lab results. Relevant abnormalities are discussed in the A/P section  CXR: bibasilar atx  ASSESSMENT / PLAN:  NEUROLOGIC A:   Acute exacerbation of myasthenia P:   RASS goal: 0 Minimize sedatives/analgesics post extubation Cont plasmapharesis per Neurology Cont methylpred per Neurology PT eval 1/26  PULMONARY OETT 1/22 >> 1/25 A: Acute respiratory failure due to MG flare P:   Monitor closely in ICU post extubation for signs of neurologic respiratory failure Supp O2 as needed PRN BiPAP  CARDIOVASCULAR L CW Port >>  HD Cath 1/22 >>  A:  Hypertension, well controlled Chronic Atrial Fibrillation - on Xarelto at baseline Bradycardia 1/23, resolved P:  Continue amiodarone 100 mg QD Decrease amlodipine to 5 mg daily 1/25 Holding metoprolol due to bradycardia Cont ICU monitoring  Holding home lasix, metolazone   RENAL A:  AKI, resolved Hypernatremia  Hypokalemia, resolved P:   Monitor BMET intermittently Monitor I/Os Correct electrolytes as indicated  GASTROINTESTINAL A:   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 GERD, chronic PPI use P:   SUP: pantoprazole per tube Leave NGT post extubation Resume TFs in 4 hrs if tolerating extubation SLP eval 1/26  HEMATOLOGIC/ONC A:   Metastatic Bladder Cancer - mets to abdominal wall  P:  Cont  full anticoagulation for AF Monitor CBC intermittently Transfuse per usual ICU guidelines  INFECTIOUS  A:   Leukocytosis - likely steroids related, no evidence of infection P:    UA 1/22 >> neg  Sputum 1/22 >> NEG  Monitor temp, WBC count Micro and abx as above  ENDOCRINE A:  DM  2 P:   Holding starlix, metformin  Cont SSI Q4   FAMILY  No family @ bedside    CC time by me 49 minutes  Merton Border, MD ; [facility] service Mobile [phone].  After 5:30 PM or weekends, call [phone]

## 2014-12-08 NOTE — Procedures (Signed)
Extubation Procedure Note  Patient Details:   Name: Craig [name] DOB: [birth year] MRN: [id number]   Pt extubated to 4L [place] per MD order, VS WNL. Pt able to vocalize, no stridor noted. Pt tolerating well at this time, RT will continue to monitor.    Evaluation  O2 sats: stable throughout Complications: No apparent complications Patient did tolerate procedure well. Bilateral Breath Sounds: Diminished Suctioning: Airway Yes  Jetty Peeks 12/08/2014, 10:33 AM

## 2014-12-08 NOTE — Progress Notes (Signed)
eLink Physician-Brief Progress Note Patient Name: Craig [name] DOB: 01-16-1941 MRN: [id number]   Date of Service  12/08/2014  HPI/Events of Note    eICU Interventions  Tylenol ordered for back pain Follow MS and WOB closely s/p extubation today. Poor performance on his IS     Intervention Category Intermediate Interventions: Pain - evaluation and management  Edgel Degnan S. 12/08/2014, 8:32 PM

## 2014-12-08 NOTE — Progress Notes (Signed)
Subjective: No acute events overnight. Feels he is seeing some improvement.   Exam: Filed Vitals:   12/08/14 0900  BP: 113/67  Pulse: 65  Temp:   Resp: 16   Gen: In bed, NAD MS: awake, alert, nods/shakes appropriately.  LP:NPYYF eye outwardly deviated and elevated compared to left.  Motor: able to lift arms off bed, though still with significnat weakness. Slightly better in legs.   Na 150  Impression: 74 yo M with MG crises. He will need to continue PLEX and steroids.   Recommendations: 1) PLEX QOD, Tx #3 tomorrow 2) Continue solumedrol 60mg  daily 3) will continue to follow.   Roland Rack, MD [facility] [phone]  If 7pm- 7am, please page neurology on call as listed in [place].

## 2014-12-09 ENCOUNTER — Inpatient Hospital Stay (HOSPITAL_COMMUNITY): Payer: PPO

## 2014-12-09 DIAGNOSIS — G7001 Myasthenia gravis with (acute) exacerbation: Secondary | ICD-10-CM | POA: Insufficient documentation

## 2014-12-09 LAB — PROTIME-INR
INR: 1.44 (ref 0.00–1.49)
PROTHROMBIN TIME: 17.7 s — AB (ref 11.6–15.2)

## 2014-12-09 LAB — GLUCOSE, CAPILLARY
GLUCOSE-CAPILLARY: 153 mg/dL — AB (ref 70–99)
GLUCOSE-CAPILLARY: 200 mg/dL — AB (ref 70–99)
GLUCOSE-CAPILLARY: 249 mg/dL — AB (ref 70–99)
Glucose-Capillary: 146 mg/dL — ABNORMAL HIGH (ref 70–99)
Glucose-Capillary: 172 mg/dL — ABNORMAL HIGH (ref 70–99)
Glucose-Capillary: 238 mg/dL — ABNORMAL HIGH (ref 70–99)

## 2014-12-09 LAB — CBC
HCT: 46.4 % (ref 39.0–52.0)
HEMOGLOBIN: 15.9 g/dL (ref 13.0–17.0)
MCH: 32 pg (ref 26.0–34.0)
MCHC: 34.3 g/dL (ref 30.0–36.0)
MCV: 93.4 fL (ref 78.0–100.0)
PLATELETS: 115 10*3/uL — AB (ref 150–400)
RBC: 4.97 MIL/uL (ref 4.22–5.81)
RDW: 12.9 % (ref 11.5–15.5)
WBC: 16.6 10*3/uL — AB (ref 4.0–10.5)

## 2014-12-09 LAB — BASIC METABOLIC PANEL
Anion gap: 9 (ref 5–15)
BUN: 26 mg/dL — AB (ref 6–23)
CO2: 33 mmol/L — ABNORMAL HIGH (ref 19–32)
CREATININE: 0.7 mg/dL (ref 0.50–1.35)
Calcium: 9.6 mg/dL (ref 8.4–10.5)
Chloride: 103 mmol/L (ref 96–112)
GFR calc Af Amer: >90 mL/min (ref >90)
GFR calc non Af Amer: >90 mL/min (ref >90)
GLUCOSE: 141 mg/dL — AB (ref 70–99)
Potassium: 3.1 mmol/L — ABNORMAL LOW (ref 3.5–5.1)
SODIUM: 145 mmol/L (ref 135–145)

## 2014-12-09 MED ORDER — ANTICOAGULANT SODIUM CITRATE 4% (200MG/5ML) IV SOLN
5.0000 mL | Freq: Once | Status: AC
  Administered 2014-12-09: 5 mL
  Filled 2014-12-09: qty 250

## 2014-12-09 MED ORDER — ACD FORMULA A 0.73-2.45-2.2 GM/100ML VI SOLN
Status: AC
  Administered 2014-12-09: 500 mL
  Filled 2014-12-09: qty 1000

## 2014-12-09 MED ORDER — POTASSIUM CHLORIDE 10 MEQ/50ML IV SOLN
10.0000 meq | INTRAVENOUS | Status: AC
  Administered 2014-12-09 (×4): 10 meq via INTRAVENOUS
  Filled 2014-12-09 (×4): qty 50

## 2014-12-09 MED ORDER — ACETAMINOPHEN 325 MG PO TABS: 650.0000 mg | ORAL_TABLET | ORAL | Status: DC | PRN

## 2014-12-09 MED ORDER — ACD FORMULA A 0.73-2.45-2.2 GM/100ML VI SOLN
500.0000 mL | Status: DC
  Administered 2014-12-09: 500 mL via INTRAVENOUS
  Filled 2014-12-09: qty 500

## 2014-12-09 MED ORDER — SODIUM CHLORIDE 0.9 % IV SOLN
INTRAVENOUS | Status: AC
  Administered 2014-12-09 (×3): via INTRAVENOUS_CENTRAL
  Filled 2014-12-09 (×3): qty 200

## 2014-12-09 MED ORDER — SODIUM CHLORIDE 0.9 % IV SOLN
4.0000 g | Freq: Once | INTRAVENOUS | Status: AC
  Administered 2014-12-09: 4 g via INTRAVENOUS
  Filled 2014-12-09: qty 40

## 2014-12-09 MED ORDER — DIPHENHYDRAMINE HCL 25 MG PO CAPS: 25.0000 mg | ORAL_CAPSULE | Freq: Four times a day (QID) | ORAL | Status: DC | PRN

## 2014-12-09 MED ORDER — POTASSIUM CHLORIDE 20 MEQ/15ML (10%) PO SOLN
40.0000 meq | Freq: Two times a day (BID) | ORAL | Status: AC
  Administered 2014-12-09 (×2): 40 meq
  Filled 2014-12-09 (×3): qty 30

## 2014-12-09 NOTE — Progress Notes (Signed)
SLP Cancellation Note  Patient Details Name: Craig [name] MRN: [id number] DOB: [birth year]   Cancelled treatment:       Reason Eval/Treat Not Completed: Medical issues which prohibited therapy (hold secondary to possible re-intubation per Dr.Kirkpatrick) Will f/u for readiness.    [name] 12/09/2014, 9:02 AM

## 2014-12-09 NOTE — Progress Notes (Signed)
[facility] ADULT ICU REPLACEMENT PROTOCOL FOR AM LAB REPLACEMENT ONLY  The patient does apply for the [facility] Adult ICU Electrolyte Replacment Protocol based on the criteria listed below:   1. Is GFR >/= 40 ml/min? Yes.    Patient's GFR today is >90 2. Is urine output >/= 0.5 ml/kg/hr for the last 6 hours? Yes.   Patient's UOP is 1.4 ml/kg/hr 3. Is BUN < 60 mg/dL? Yes.    Patient's BUN today is 26 4. Abnormal electrolyte(s):Potassium 5. Ordered repletion with: Potassium per protocol    Amour Cutrone P 12/09/2014 5:31 AM

## 2014-12-09 NOTE — Progress Notes (Signed)
Subjective: Extubated yesterday, feels slightly SOB.   Exam: Filed Vitals:   12/09/14 0700  BP: 119/61  Pulse: 82  Temp: 97.8 F (36.6 C)  Resp: 19   Gen: In bed, NAD MS: awake, alert, answers question appropriately HW:KGSUP eye with very limited movement, slightly better in left eye.  Motor: able to lift arms off bed, 3/5 neck flexion.   Impression: 74 yo M with MG crises. He will need to continue PLEX and steroids.   Recommendations: 1) PLEX QOD, Tx #3 today.  2) Continue solumedrol 60mg  daily 3) Frequent NIF and VC, may need re-intubation.   Roland Rack, MD [facility] [phone]  If 7pm- 7am, please page neurology on call as listed in [place].

## 2014-12-09 NOTE — Progress Notes (Signed)
RT Note: NIF -10, VC .8L, good pt effort noted, RT to monitor.

## 2014-12-09 NOTE — Progress Notes (Signed)
PULMONARY / CRITICAL CARE MEDICINE   Name: Craig [name] MRN: [id number] DOB: [birth year]    ADMISSION DATE:  12/05/2014 CONSULTATION DATE:  12/05/14  REFERRING MD :  Dr. Coralyn Pear  CHIEF COMPLAINT:  Respiratory Failure  INITIAL PRESENTATION: [name], never smoker, transferred to [facility] on 1/22 with acute MG exacerbation & respiratory failure.  Decompensated requiring intubation 1/22 pm.  Tx to ICU.    STUDIES:  1/22  CTA Chest >> neg for PE, bilateral lower lobe atelectasis, scarring, calcified plaque in LAD, L circ, L thyroid nodule  SIGNIFICANT EVENTS: 1/22  Admit to [facility] with acute MG exacerbation, acute respiratory failure.  Intubated pm of 1/22, tx to ICU 1/22 started plasmapheresis 1/23 PSV yesterday 7 hours, tired out, oozing from R IJ and blood tinged urine 1/25 Extubated. Tolerating initially. Subjective dyspnea without overt distress. PRN BiPAP  SUBJECTIVE:  Subjective dyspnea without overt distress last night. PRN BiPAP. Appeared comfortable on [place] O2 this AM. Strong cough but does poorly on IS  VITAL SIGNS: Temp:  [97.5 F (36.4 C)-98.9 F (37.2 C)] 98.9 F (37.2 C) (01/26 1113) Pulse Rate:  [34-101] 63 (01/26 1200) Resp:  [9-33] 17 (01/26 1200) BP: (102-134)/(61-85) 110/75 mmHg (01/26 1200) SpO2:  [92 %-98 %] 98 % (01/26 1200) FiO2 (%):  [40 %-100 %] 100 % (01/26 1200)   HEMODYNAMICS:     VENTILATOR SETTINGS: Vent Mode:  [-] PSV;CPAP FiO2 (%):  [40 %-100 %] 100 % PEEP:  [5 cmH20] 5 cmH20 Pressure Support:  [10 cmH20] 10 cmH20   INTAKE / OUTPUT:  Intake/Output Summary (Last 24 hours) at 12/09/14 1346 Last data filed at 12/09/14 1200  Gross per 24 hour  Intake   2520 ml  Output   3375 ml  Net   -855 ml    PHYSICAL EXAMINATION: General: NAD Neuro: R lid lag imrpoved, bilateral grip strength intact HEENT: NCAT Cardiovascular: IRIR, rate controlled Lungs:  Few rhonchi bilaterally Abdomen:  BS+, soft, nontender  Ext: no edema  LABS: I have reviewed all of  today's lab results. Relevant abnormalities are discussed in the A/P section  CXR: Minimal bibasilar atx  ASSESSMENT / PLAN:  NEUROLOGIC A:   Acute exacerbation of myasthenia P:   RASS goal: 0 Minimize sedatives/analgesics post extubation Cont plasmapharesis per Neurology - today is # 3/5 Cont methylpred per Neurology PT eval  PULMONARY OETT 1/22 >> 1/25 A: Acute respiratory failure due to MG flare P:   Cont to monitor in ICU for signs of neurologic respiratory failure Supp O2 as needed Cont PRN BiPAP  CARDIOVASCULAR L CW Port  (chronic) HD Cath 1/22 >>  A:  Hypertension, controlled Chronic Atrial Fibrillation Bradycardia 1/23, resolved P:  Continue amiodarone 100 mg QD Cont amlodipine  Cont holding metoprolol due to previous bradycardia Cont ICU monitoring  Holding home lasix, metolazone   RENAL A:  AKI, resolved Hypernatremia, improving Hypokalemia, recurrent P:   Monitor BMET intermittently Monitor I/Os Correct electrolytes as indicated  GASTROINTESTINAL A:   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 with bulbar symptoms H/O GERD, chronic PPI use P:   SUP: pantoprazole per tube Cont TFs per NGT SLP eval 1/27 if indicated  HEM/ONC A:   Metastatic Bladder Cancer Mild thrombocytopenia P:  Cont full anticoagulation for AF Monitor CBC intermittently Transfuse per usual ICU guidelines  INFECTIOUS  A:   Steroid induced leukocytosis P:    UA 1/22 >> neg  Sputum 1/22 >> NEG  Monitor temp, WBC count Micro and abx as  above  ENDOCRINE A:  DM  2 P:   Holding starlix, metformin  Cont SSI Q4    Merton Border, MD ; [facility] [phone].  After 5:30 PM or weekends, call [phone]

## 2014-12-09 NOTE — Progress Notes (Signed)
PT Cancellation Note  Patient Details Name: Craig [name] MRN: [id number] DOB: 1941/04/21   Cancelled Treatment:    Reason Eval/Treat Not Completed: Medical issues which prohibited therapy (RN request hold secondary to possible re-intubation per Dr.Kirkpatrick)   [name] 12/09/2014, 8:17 AM Elwyn Reach, [place]

## 2014-12-10 DIAGNOSIS — J9600 Acute respiratory failure, unspecified whether with hypoxia or hypercapnia: Secondary | ICD-10-CM

## 2014-12-10 DIAGNOSIS — Z515 Encounter for palliative care: Secondary | ICD-10-CM

## 2014-12-10 DIAGNOSIS — I4891 Unspecified atrial fibrillation: Secondary | ICD-10-CM

## 2014-12-10 LAB — GLUCOSE, CAPILLARY
GLUCOSE-CAPILLARY: 142 mg/dL — AB (ref 70–99)
GLUCOSE-CAPILLARY: 185 mg/dL — AB (ref 70–99)
Glucose-Capillary: 145 mg/dL — ABNORMAL HIGH (ref 70–99)
Glucose-Capillary: 157 mg/dL — ABNORMAL HIGH (ref 70–99)
Glucose-Capillary: 241 mg/dL — ABNORMAL HIGH (ref 70–99)
Glucose-Capillary: 232 mg/dL — ABNORMAL HIGH (ref 70–99)

## 2014-12-10 LAB — BASIC METABOLIC PANEL
Anion gap: 4 — ABNORMAL LOW (ref 5–15)
BUN: 33 mg/dL — AB (ref 6–23)
CHLORIDE: 108 mmol/L (ref 96–112)
CO2: 28 mmol/L (ref 19–32)
Calcium: 8.9 mg/dL (ref 8.4–10.5)
Creatinine, Ser: 0.84 mg/dL (ref 0.50–1.35)
GFR calc non Af Amer: 84 mL/min — ABNORMAL LOW (ref >90)
GLUCOSE: 139 mg/dL — AB (ref 70–99)
POTASSIUM: 4.1 mmol/L (ref 3.5–5.1)
Sodium: 140 mmol/L (ref 135–145)

## 2014-12-10 MED ORDER — FREE WATER
200.0000 mL | Freq: Three times a day (TID) | Status: DC
  Administered 2014-12-10 – 2014-12-12 (×6): 200 mL

## 2014-12-10 NOTE — Progress Notes (Signed)
PULMONARY / CRITICAL CARE MEDICINE   Name: Kaysen Deal MRN: [id number] DOB: May 20, 1941    ADMISSION DATE:  12/05/2014 CONSULTATION DATE:  12/05/14  REFERRING MD :  Dr. Coralyn Pear  CHIEF COMPLAINT:  Respiratory Failure  INITIAL PRESENTATION: [name], never smoker, transferred to [facility] on 1/22 with acute MG exacerbation & respiratory failure.  Decompensated requiring intubation 1/22 pm.  Tx to ICU.    STUDIES:  1/22  CTA Chest >> neg for PE, bilateral lower lobe atelectasis, scarring, calcified plaque in LAD, L circ, L thyroid nodule  SIGNIFICANT EVENTS: 1/22  Admit to [facility] with acute MG exacerbation, acute respiratory failure.  Intubated pm of 1/22, tx to ICU 1/22 started plasmapheresis 1/23 PSV yesterday 7 hours, tired out, oozing from R IJ and blood tinged urine 1/25 Extubated. Tolerating initially. Subjective dyspnea without overt distress. PRN BiPAP 1/26, 1/27 Requiring intermittent NPPV but well supported on it. Overall stronger.  1/27 Transfer to SDU  SUBJECTIVE:  Continues to request intermittent PRN BiPAP. Appeared comfortable on [place] O2 this AM. Adequate cough  VITAL SIGNS: Temp:  [97.4 F (36.3 C)-98.6 F (37 C)] 97.5 F (36.4 C) (01/27 1100) Pulse Rate:  [63-82] 71 (01/27 1200) Resp:  [10-26] 20 (01/27 1200) BP: (97-120)/(59-82) 113/74 mmHg (01/27 1200) SpO2:  [93 %-99 %] 96 % (01/27 1200) FiO2 (%):  [40 %-60 %] 60 % (01/27 1200) Weight:  [106.6 kg (235 lb 0.2 oz)-109.8 kg (242 lb 1 oz)] 106.6 kg (235 lb 0.2 oz) (01/27 0500)   HEMODYNAMICS:     VENTILATOR SETTINGS: Vent Mode:  [-]  FiO2 (%):  [40 %-60 %] 60 %   INTAKE / OUTPUT:  Intake/Output Summary (Last 24 hours) at 12/10/14 1330 Last data filed at 12/10/14 1300  Gross per 24 hour  Intake   2110 ml  Output   1725 ml  Net    385 ml    PHYSICAL EXAMINATION: General: NAD Neuro: R lid lag improved, mild-mod decrease in BUE strength, grip strength excellent HEENT: NCAT Cardiovascular: IRIR, rate  controlled Lungs:  Few rhonchi bilaterally Abdomen:  BS+, soft, nontender  Ext: no edema  LABS: I have reviewed all of today's lab results. Relevant abnormalities are discussed in the A/P section  CXR: Minimal bibasilar atx  ASSESSMENT / PLAN:  NEUROLOGIC A:   Acute exacerbation of myasthenia P:   RASS goal: 0 Minimize sedatives/analgesics Cont plasmapharesis per Neurology - today is # 4/5 Cont methylpred per Neurology PT eval 1/27  PULMONARY OETT 1/22 >> 1/25 A: Acute neuromuscular respiratory failure  P:   Cont to monitor closely for signs of neurologic respiratory failure Supp O2 as needed Cont PRN BiPAP  CARDIOVASCULAR L CW Port  (chronic) R IJ HD Cath 1/22 >>  A:  Hypertension, controlled Chronic Atrial Fibrillation Bradycardia 1/23, resolved P:  Continue amiodarone 100 mg QD Cont amlodipine at reduced dose Cont holding metoprolol due to previous bradycardia Cont hemodynamic monitoring  Holding home lasix, metolazone   RENAL A:  AKI, resolved Hypernatremia, resolved Hypokalemia, recurrent P:   Monitor BMET intermittently Monitor I/Os Correct electrolytes as indicated  GASTROINTESTINAL A:   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 with bulbar symptoms H/O GERD, chronic PPI use P:   SUP: pantoprazole per tube Cont TFs per NGT SLP eval deferred until further clinical improvement  HEM/ONC A:   Metastatic Bladder Cancer Mild thrombocytopenia P:  Cont full anticoagulation for AF Monitor CBC intermittently Transfuse per usual ICU guidelines  INFECTIOUS  A:   Steroid  induced leukocytosis P:    UA 1/22 >> neg  Sputum 1/22 >> NEG  Monitor temp, WBC count Micro and abx as above  ENDOCRINE A:  DM  2, controlled P:   Holding starlix, metformin  Cont SSI Q4    Merton Border, MD ; [facility] [phone].  After 5:30 PM or weekends, call [phone]

## 2014-12-10 NOTE — Progress Notes (Signed)
Patient is using his own Bi-Pap Mask; please make sure to send home with him.

## 2014-12-10 NOTE — Progress Notes (Signed)
Inpatient Diabetes Program Recommendations  AACE/ADA: New Consensus Statement on Inpatient Glycemic Control (2013)  Target Ranges:  Prepandial:   less than 140 mg/dL      Peak postprandial:   less than 180 mg/dL (1-2 hours)      Critically ill patients:  140 - 180 mg/dL   Reason for Assessment:  Results for Craig [name], Craig [name] (MRN [id number]) as of 12/10/2014 10:50  Ref. Range 12/09/2014 16:02 12/09/2014 19:25 12/09/2014 23:34 12/10/2014 03:48 12/10/2014 08:09  Glucose-Capillary Latest Range: 70-99 mg/dL 238 (H) 249 (H) 200 (H) 142 (H) 145 (H)   Outpatient Diabetes medications: Starlix 120 mg tid, Metformin 500 mg bid Current orders for Inpatient glycemic control:  Novolog resistant q 4 hours  May consider adding low dose basal insulin.  Consider Levemir 15 units daily.  Thanks, Adah Perl, RN, BC-ADM Inpatient Diabetes Coordinator Pager [phone]

## 2014-12-10 NOTE — Consult Note (Signed)
Physical Medicine and Rehabilitation Consult  Reason for Consult: MG crises Referring Physician: Dr. Alva Garnet.    HPI: Craig [name] is a 74 y.o. male with history of A fib, metastatic bladder cancer, recent PNA, MG who has had worsening over past months. He was evaluated by neurology 3 days PTA and was placed on steroids with increase in  mestinon due to difficulty swallowing and SOB. Patient continued to worsen and was admitted via [facility] on 12/05/14 with SOB, swallowing difficulty, difficulty hold head up with droopy eyelids and congestion. s. He was intubated due to respiratory distress and started on IV solumedrol as well as plasmapheresis per input by Dr. Doy Mince.  He has responded to treatment and tolerated extubation on 01/25 with use of BIPAP prn. He is able to suction oral secretions and is currently NPO with tube feeds for nutritional support.    Therapy initiated today and patient noted to have right sided as well as truncal weakness impacting mobility. CIR recommended by MD and rehab team.      Review of Systems  Unable to perform ROS: other      Past Medical History  Diagnosis Date  . Atrial fibrillation, chronic     on Xarelto   . HTN (hypertension)   . CHF (congestive heart failure)   . Diabetes mellitus   . 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 Abdominal wall metastasis removal      History reviewed. No pertinent family history.    Social History:  reports that he has never smoked. He does not have any smokeless tobacco history on file. He reports that he does not drink alcohol or use illicit drugs.    Allergies: No Known Allergies    Medications Prior to Admission  Medication Sig Dispense Refill  . acetaminophen (TYLENOL) 650 MG CR tablet Take 650 mg by mouth 2 (two) times daily as needed for pain.    Marland Kitchen amiodarone (PACERONE) 100 MG tablet Take 100  mg by mouth daily.    Marland Kitchen amLODipine (NORVASC) 10 MG tablet Take 10 mg by mouth daily.    . Ascorbic Acid (VITAMIN C) 1000 MG tablet Take 1,000 mg by mouth daily.    Marland Kitchen atorvastatin (LIPITOR) 40 MG tablet Take 40 mg by mouth daily.    . [place]-Glucosamine-Vit D (GLUCOSAMINE COMPLEX PO) Take 1 tablet by mouth daily.    . furosemide (LASIX) 80 MG tablet Take 80 mg by mouth daily.    Marland Kitchen ipratropium (ATROVENT HFA) 17 MCG/ACT inhaler Inhale 1 puff into the lungs every 6 (six) hours as needed for wheezing.    . IRON PO Take 1 tablet by mouth daily. 65mg     . metFORMIN (GLUCOPHAGE) 500 MG tablet Take 500 mg by mouth 2 (two) times daily with a meal.    . metolazone (ZAROXOLYN) 2.5 MG tablet Take 2.5 mg by mouth daily as needed. Only takes if he gains extra 5 pounds to help with the lasix per wife    . metoprolol tartrate (LOPRESSOR) 25 MG tablet Take 25 mg by mouth daily.    . Multiple Vitamin (MULTIVITAMIN WITH MINERALS) TABS tablet Take 1 tablet by mouth daily.    . nateglinide (STARLIX) 120 MG tablet Take 120 mg by mouth 3 (three) times daily with meals.    . Omega-3 Fatty Acids (FISH OIL) 1000 MG CAPS Take 1  capsule by mouth daily.    . pantoprazole (PROTONIX) 40 MG tablet Take 40 mg by mouth daily.    . potassium chloride SA (K-DUR,KLOR-CON) 20 MEQ tablet Take 20 mEq by mouth 3 (three) times daily.    . predniSONE (DELTASONE) 20 MG tablet Take 20 mg by mouth 3 (three) times daily.    Marland Kitchen pyridostigmine (MESTINON) 60 MG tablet Take 60 mg by mouth 3 (three) times daily.    . rivaroxaban (XARELTO) 20 MG TABS tablet Take 20 mg by mouth daily with supper.    . zinc gluconate 50 MG tablet Take 50 mg by mouth daily.      Home: Home Living Family/patient expects to be discharged to:: Private residence Living Arrangements: Spouse/significant other Available Help at Discharge: Family, Available 24 hours/day Type of Home: House Home Access: Stairs to enter Technical brewer of Steps: 3 Entrance  Stairs-Rails: Right Home Layout: One [place]: Environmental consultant - 2 wheels, Cane - single point, Bedside commode Additional Comments: wife reports that if he goes home, they will probably need tub bench  Functional History: Prior Function Level of Independence: Independent with assistive device(s) Comments: ambulated with cane, bathed and dressed self, fixed easy meals, wife drives Functional Status:  Mobility: Bed Mobility Overal bed mobility: Needs Assistance Bed Mobility: Supine to Sit Supine to sit: Min assist General bed mobility comments: pivoted pt to prevent laying him flat due to breathing issues. Pt able to slide legs to EOB, min A to rotatie hips and slide hips fwd to EOB Transfers Overall transfer level: Needs assistance Equipment used: 2 person hand held assist Transfers: Sit to/from Stand, Stand Pivot Transfers Sit to Stand: Min assist, +2 safety/equipment, +2 physical assistance Stand pivot transfers: +2 physical assistance, +2 safety/equipment, Min assist General transfer comment: pt fatigued once sitting but motivated to get OOB. Min HHA on each side for sit to stand and pivot. Cervical flexion maintained, small pivot steps with feet. Pt lacked energy to do more than this today. Ambulation/Gait General Gait Details: unable today    ADL:    Cognition: Cognition Overall Cognitive Status: Within Functional Limits for tasks assessed Orientation Level: Appropriate for developmental age, Oriented to person, Oriented to place, Oriented to situation Cognition Arousal/Alertness: Awake/alert Behavior During Therapy: WFL for tasks assessed/performed Overall Cognitive Status: Within Functional Limits for tasks assessed  Blood pressure 113/74, pulse 71, temperature 97.5 F (36.4 C), temperature source Oral, resp. rate 20, height 5\' 7"  (1.702 m), weight 106.6 kg (235 lb 0.2 oz), SpO2 96 %. Physical Exam  Nursing note and vitals reviewed. Constitutional: He appears  well-developed and well-nourished.  Fatigued. Resting on BIPAP but able to participate in exam. Panda in place with dry blood on nares.    HENT:  Head: Normocephalic and atraumatic.  Eyes: Left conjunctiva is injected.  Right ptosis.   Cardiovascular: Normal rate and regular rhythm.   Respiratory: Effort normal. No respiratory distress. He has no wheezes. He has rhonchi.  GI: Soft. Bowel sounds are normal. He exhibits no distension. There is no tenderness.  Musculoskeletal: He exhibits no edema or tenderness.  Neurological: He is alert.  Facial diplegia. Poor oral-motor control. Soft voice. Ptosis bilaterally, left more than right.  Followed motor commands without difficulty. Reasonable insight and awareness. Sensory exam intact to PP and LT. Strength 3/5 UE's bilaterally. LE's 3/5 hf, ke, 3+ ankles.   Skin: Skin is warm and dry.  Psychiatric: He has a normal mood and affect. His behavior is normal.  Results for orders placed or performed during the hospital encounter of 12/05/14 (from the past 24 hour(s))  Glucose, capillary     Status: Abnormal   Collection Time: 12/09/14  4:02 PM  Result Value Ref Range   Glucose-Capillary 238 (H) 70 - 99 mg/dL   Comment 1 Notify RN   Glucose, capillary     Status: Abnormal   Collection Time: 12/09/14  7:25 PM  Result Value Ref Range   Glucose-Capillary 249 (H) 70 - 99 mg/dL   Comment 1 Capillary Sample    Comment 2 Documented in Chart    Comment 3 Notify RN   Glucose, capillary     Status: Abnormal   Collection Time: 12/09/14 11:34 PM  Result Value Ref Range   Glucose-Capillary 200 (H) 70 - 99 mg/dL   Comment 1 Capillary Sample    Comment 2 Documented in Chart    Comment 3 Notify RN   Glucose, capillary     Status: Abnormal   Collection Time: 12/10/14  3:48 AM  Result Value Ref Range   Glucose-Capillary 142 (H) 70 - 99 mg/dL   Comment 1 Capillary Sample    Comment 2 Documented in Chart    Comment 3 Notify RN   BMET in AM     Status:  Abnormal   Collection Time: 12/10/14  3:59 AM  Result Value Ref Range   Sodium 140 135 - 145 mmol/L   Potassium 4.1 3.5 - 5.1 mmol/L   Chloride 108 96 - 112 mmol/L   CO2 28 19 - 32 mmol/L   Glucose, Bld 139 (H) 70 - 99 mg/dL   BUN 33 (H) 6 - 23 mg/dL   Creatinine, Ser 0.84 0.50 - 1.35 mg/dL   Calcium 8.9 8.4 - 10.5 mg/dL   GFR calc non Af Amer 84 (L) >90 mL/min   GFR calc Af Amer >90 >90 mL/min   Anion gap 4 (L) 5 - 15  Glucose, capillary     Status: Abnormal   Collection Time: 12/10/14  8:09 AM  Result Value Ref Range   Glucose-Capillary 145 (H) 70 - 99 mg/dL   Comment 1 Notify RN   Glucose, capillary     Status: Abnormal   Collection Time: 12/10/14 11:49 AM  Result Value Ref Range   Glucose-Capillary 157 (H) 70 - 99 mg/dL   Comment 1 Notify RN    Dg Chest Port 1 View  12/09/2014   CLINICAL DATA:  Respiratory failure.  EXAM: PORTABLE CHEST - 1 VIEW  COMPARISON:  December 08, 2014.  FINDINGS: Stable cardiomediastinal silhouette. Endotracheal tube has been removed. Nasogastric tube is seen entering stomach. Stable left subclavian Port-A-Cath is noted with distal tip at expected position of cavoatrial junction. Stable right internal jugular catheter is noted with tip in expected position of the SVC. No pneumothorax or significant pleural effusion is noted. Minimal bibasilar subsegmental atelectasis is noted. Bony thorax is intact.  IMPRESSION: Minimal bibasilar subsegmental atelectasis. Endotracheal tube has been removed. Otherwise stable support apparatus.   Electronically Signed   By: Sabino Dick M.D.   On: 12/09/2014 08:03    Assessment/Plan: Diagnosis: Myasthenia gravis with crisis 1. Does the need for close, 24 hr/day medical supervision in concert with the patient's rehab needs make it unreasonable for this patient to be served in a less intensive setting? Yes 2. Co-Morbidities requiring supervision/potential complications: htn, afib, severe dysphagia 3. Due to bladder  management, bowel management, safety, skin/wound care, disease management, medication administration, pain management  and patient education, does the patient require 24 hr/day rehab nursing? Yes 4. Does the patient require coordinated care of a physician, rehab nurse, PT (1-2 hrs/day, 5 days/week), OT (1-2 hrs/day, 5 days/week) and SLP (1-2 hrs/day, 5 days/week) to address physical and functional deficits in the context of the above medical diagnosis(es)? Yes Addressing deficits in the following areas: balance, endurance, locomotion, strength, transferring, bowel/bladder control, bathing, dressing, feeding, grooming, toileting, speech, swallowing and psychosocial support 5. Can the patient actively participate in an intensive therapy program of at least 3 hrs of therapy per day at least 5 days per week? Yes 6. The potential for patient to make measurable gains while on inpatient rehab is excellent 7. Anticipated functional outcomes upon discharge from inpatient rehab are modified independent and supervision  with PT, modified independent and supervision with OT, modified independent, supervision and min assist with SLP. 8. Estimated rehab length of stay to reach the above functional goals is: 16-22 days 9. Does the patient have adequate social supports and living environment to accommodate these discharge functional goals? Yes 10. Anticipated D/C setting: Home 11. Anticipated post D/C treatments: HH therapy and Outpatient therapy 12. Overall Rehab/Functional Prognosis: excellent  RECOMMENDATIONS: This patient's condition is appropriate for continued rehabilitative care in the following setting: CIR Patient has agreed to participate in recommended program. Yes Note that insurance prior authorization may be required for reimbursement for recommended care.  Comment: Rehab Admissions Coordinator to follow up.  Thanks,  Meredith Staggers, MD, Mellody Drown     12/10/2014

## 2014-12-10 NOTE — Progress Notes (Signed)
Subjective: patient in room suctioning secretions but states the secretions have not become worse or increased over night. Now on PRN BiPAP. Feels slightly stronger.   Objective: Current vital signs: BP 118/62 mmHg  Pulse 78  Temp(Src) 97.4 F (36.3 C) (Axillary)  Resp 26  Ht 5\' 7"  (1.702 m)  Wt 106.6 kg (235 lb 0.2 oz)  BMI 36.80 kg/m2  SpO2 95% Vital signs in last 24 hours: Temp:  [97.4 F (36.3 C)-98.6 F (37 C)] 97.4 F (36.3 C) (01/27 0810) Pulse Rate:  [63-82] 78 (01/27 1100) Resp:  [0-26] 26 (01/27 1100) BP: (97-120)/(59-82) 118/62 mmHg (01/27 1100) SpO2:  [93 %-99 %] 95 % (01/27 1100) FiO2 (%):  [40 %-100 %] 40 % (01/27 0810) Weight:  [106.6 kg (235 lb 0.2 oz)-109.8 kg (242 lb 1 oz)] 106.6 kg (235 lb 0.2 oz) (01/27 0500)  Intake/Output from previous day: 01/26 0701 - 01/27 0700 In: 2300 [I.V.:220; NG/GT:2080] Out: 1700 [Urine:1700] Intake/Output this shift: Total I/O In: 310 [I.V.:30; NG/GT:280] Out: 200 [Urine:200] Nutritional status: Diet NPO time specified  Neurologic Exam:  Mental Status: Alert, oriented,  Answers questions appropriately.   Able to follow 3 step commands without difficulty. Cranial Nerves: II:  Visual fields grossly normal, pupils equal, round, reactive to light and accommodation III,IV, VI: ptosis in right eye (improved), decreased ability to abduct both eyes.  Left eye slightly lower than right eye.  V,VII: smile symmetric, facial light touch sensation normal bilaterally VIII: hearing normal bilaterally IX,X: gag reflex present XI: bilateral shoulder shrug XII: midline tongue extension without atrophy or fasciculations  Motor: Able to lift right arm 2 inches off chair, left arm able to lift one inch off chair.  Both legs show 4/5 strength. Neck flexion 4/5.  Sensory: Pinprick and light touch intact throughout, bilaterally Deep Tendon Reflexes:  Right: Upper Extremity   Left: Upper extremity   biceps (C-5 to C-6) 2/4   biceps (C-5  to C-6) 2/4 tricep (C7) 2/4    triceps (C7) 2/4 Brachioradialis (C6) 2/4  Brachioradialis (C6) 2/4  Lower Extremity Lower Extremity  quadriceps (L-2 to L-4) 2/4   quadriceps (L-2 to L-4) 2/4    Lab Results: Basic Metabolic Panel:  Recent Labs Lab 12/06/14 0357 12/07/14 0400 12/07/14 1248 12/08/14 0352 12/09/14 0400 12/10/14 0359  NA 149* 149* 152* 150* 145 140  K 3.2* 3.5 3.9 4.0 3.1* 4.1  CL 114* 117* 112 117* 103 108  CO2 26 25  --  29 33* 28  GLUCOSE 189* 151* 165* 150* 141* 139*  BUN 26* 34* 36* 37* 26* 33*  CREATININE 1.33 1.06 1.00 0.87 0.70 0.84  CALCIUM 9.2 9.0  --  9.1 9.6 8.9  MG 1.7  --   --   --   --   --   PHOS 2.7  --   --   --   --   --     Liver Function Tests:  Recent Labs Lab 12/05/14 2015 12/06/14 0357  AST 42* 18  ALT 45 20  ALKPHOS 70 30*  BILITOT 0.9 0.9  PROT 6.8 5.4*  ALBUMIN 3.6 4.2   No results for input(s): LIPASE, AMYLASE in the last 168 hours. No results for input(s): AMMONIA in the last 168 hours.  CBC:  Recent Labs Lab 12/05/14 2015  12/06/14 0357 12/07/14 0400 12/07/14 1248 12/08/14 0352 12/09/14 0400  WBC 15.2*  --  15.9* 18.4*  --  15.6* 16.6*  NEUTROABS  --   --   --  15.7*  --   --   --   HGB 14.8  < > 14.7 13.2 15.0 14.2 15.9  HCT 43.2  < > 43.8 38.9* 44.0 42.5 46.4  MCV 92.5  --  93.0 93.1  --  95.1 93.4  PLT 220  --  199 161  --  109* 115*  < > = values in this interval not displayed.  Cardiac Enzymes: No results for input(s): CKTOTAL, CKMB, CKMBINDEX, TROPONINI in the last 168 hours.  Lipid Panel: No results for input(s): CHOL, TRIG, HDL, CHOLHDL, VLDL, LDLCALC in the last 168 hours.  CBG:  Recent Labs Lab 12/09/14 1602 12/09/14 1925 12/09/14 2334 12/10/14 0348 12/10/14 0809  GLUCAP 238* 249* 200* 142* 145*    Microbiology: Results for orders placed or performed during the hospital encounter of 12/05/14  MRSA PCR Screening     Status: None   Collection Time: 12/05/14  6:01 PM  Result Value  Ref Range Status   MRSA by PCR NEGATIVE NEGATIVE Final    Comment:        The GeneXpert MRSA Assay (FDA approved for NASAL specimens only), is one component of a comprehensive MRSA colonization surveillance program. It is not intended to diagnose MRSA infection nor to guide or monitor treatment for MRSA infections.   Culture, respiratory (NON-Expectorated)     Status: None   Collection Time: 12/06/14  3:55 AM  Result Value Ref Range Status   Specimen Description TRACHEAL ASPIRATE  Final   Special Requests Immunocompromised  Final   Gram Stain   Final    MODERATE WBC PRESENT, PREDOMINANTLY PMN FEW SQUAMOUS EPITHELIAL CELLS PRESENT MODERATE GRAM POSITIVE COCCI IN PAIRS IN CHAINS Performed at [facility]    Culture   Final    Non-Pathogenic Oropharyngeal-type Flora Isolated. Performed at [facility]    Report Status 12/08/2014 FINAL  Final    Coagulation Studies:  Recent Labs  12/09/14 0400  LABPROT 17.7*  INR 1.44    Imaging: Dg Chest Port 1 View  12/09/2014   CLINICAL DATA:  Respiratory failure.  EXAM: PORTABLE CHEST - 1 VIEW  COMPARISON:  December 08, 2014.  FINDINGS: Stable cardiomediastinal silhouette. Endotracheal tube has been removed. Nasogastric tube is seen entering stomach. Stable left subclavian Port-A-Cath is noted with distal tip at expected position of cavoatrial junction. Stable right internal jugular catheter is noted with tip in expected position of the SVC. No pneumothorax or significant pleural effusion is noted. Minimal bibasilar subsegmental atelectasis is noted. Bony thorax is intact.  IMPRESSION: Minimal bibasilar subsegmental atelectasis. Endotracheal tube has been removed. Otherwise stable support apparatus.   Electronically Signed   By: Sabino Dick M.D.   On: 12/09/2014 08:03    Medications:  Scheduled: . amiodarone  100 mg Per Tube Daily  . amLODipine  5 mg Per Tube Daily  . antiseptic oral rinse  7 mL Mouth Rinse QID  .  chlorhexidine  15 mL Mouth Rinse BID  . feeding supplement (PRO-STAT SUGAR FREE 64)  30 mL Per Tube QID  . feeding supplement (VITAL HIGH PROTEIN)  1,000 mL Per Tube Q24H  . free water  200 mL Per Tube 3 times per day  . insulin aspart  0-20 Units Subcutaneous 6 times per day  . methylPREDNISolone (SOLU-MEDROL) injection  60 mg Intravenous Daily  . pantoprazole sodium  40 mg Per Tube Daily  . rivaroxaban  20 mg Oral Q supper  . sodium chloride  10-40 mL Intracatheter Q12H  Continuous: . sodium chloride 10 mL/hr at 12/10/14 0600   PET:[id number] (TYLENOL) oral liquid 160 mg/5 mL, albuterol, artificial tears, fentaNYL, sodium chloride  Assessment/Plan: Impression: 74 yo M with MG crises. He will need to continue PLEX and steroids.   Recommendations: 1) PLEX QOD, Tx #3 yesterday and planned for Tx4 tomorrow.  2) Continue solumedrol 60mg  daily 3) Frequent NIF and VC, may need re-intubation (last NIF -10)     Etta Quill PA-C [facility] [phone]  12/10/2014, 11:28 AM

## 2014-12-10 NOTE — Progress Notes (Signed)
Rehab Admissions Coordinator Note:  Patient was screened by Ermine Spofford L for appropriateness for an Inpatient Acute Rehab Consult.  At this time, we are recommending Inpatient Rehab consult.  Gabrial Domine L 12/10/2014, 12:47 PM  I can be reached at [phone].

## 2014-12-10 NOTE — Evaluation (Signed)
Physical Therapy Evaluation Patient Details Name: Craig [name] MRN: [id number] DOB: 11/12/1941 Today's Date: 12/10/2014   History of Present Illness  Hamdan Toscano is a 74 y.o. 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 Patient having progressive weakness over the past month becoming worse in past week. Pt with acute MG exacerbation & respiratory failure. Decompensated requiring intubation 1/22 pm, extubated 12/08/14, currently on bipap PRN.   Clinical Impression  Pt admitted with above diagnosis. Pt currently with functional limitations due to the deficits listed below (see PT Problem List). Pt transferred bed to chair with +2 min A, not strong enough currently to ambulate, right sided weakness noted, UE and LE as well as spine and face.  Pt will benefit from skilled PT to increase their independence and safety with mobility to allow discharge to the venue listed below.       Follow Up Recommendations CIR    Equipment Recommendations  Other (comment) (tub bench)    Recommendations for Other Services OT consult;Rehab consult     Precautions / Restrictions Precautions Precautions: Fall Restrictions Weight Bearing Restrictions: No      Mobility  Bed Mobility Overal bed mobility: Needs Assistance Bed Mobility: Supine to Sit     Supine to sit: Min assist     General bed mobility comments: pivoted pt to prevent laying him flat due to breathing issues. Pt able to slide legs to EOB, min A to rotatie hips and slide hips fwd to EOB  Transfers Overall transfer level: Needs assistance Equipment used: 2 person hand held assist Transfers: Sit to/from [facility] Sit to Stand: Min assist;+2 safety/equipment;+2 physical assistance Stand pivot transfers: +2 physical  assistance;+2 safety/equipment;Min assist       General transfer comment: pt fatigued once sitting but motivated to get OOB. Min HHA on each side for sit to stand and pivot. Cervical flexion maintained, small pivot steps with feet. Pt lacked energy to do more than this today.  Ambulation/Gait             General Gait Details: unable today  Stairs            Wheelchair Mobility    Modified Rankin (Stroke Patients Only)       Balance Overall balance assessment: Needs assistance Sitting-balance support: Bilateral upper extremity supported;Feet supported Sitting balance-Leahy Scale: Fair     Standing balance support: Bilateral upper extremity supported;During functional activity Standing balance-Leahy Scale: Poor                               Pertinent Vitals/Pain Pain Assessment: No/denies pain  RN increased O2 to 6L during session, O2 sats 96% with pt in chair    Home Living Family/patient expects to be discharged to:: Private residence Living Arrangements: Spouse/significant other Available Help at Discharge: Family;Available 24 hours/day Type of Home: House Home Access: Stairs to enter Entrance Stairs-Rails: Right Entrance Stairs-Number of Steps: 3 Home Layout: One level Home Equipment: Walker - 2 wheels;Cane - single point;Bedside commode Additional Comments: wife reports that if he goes home, they will probably need tub bench    Prior Function Level of Independence: Independent with assistive device(s)         Comments: ambulated with cane, bathed and dressed self, fixed easy meals, wife drives  Hand Dominance   Dominant Hand: Right    Extremity/Trunk Assessment   Upper Extremity Assessment: RUE deficits/detail;LUE deficits/detail RUE Deficits / Details: generalized weakness throughout but RUE weakner than left due to MG flare, see OT note for more specific info     LUE Deficits / Details: generalized weakness   Lower  Extremity Assessment: RLE deficits/detail RLE Deficits / Details: hip flex 3/5, knee flex/ ext 3/5 weakness began with MG flare, LLE WFL    Cervical / Trunk Assessment: Other exceptions  Communication   Communication: Expressive difficulties (due to facial weakness)  Cognition Arousal/Alertness: Awake/alert Behavior During Therapy: WFL for tasks assessed/performed Overall Cognitive Status: Within Functional Limits for tasks assessed                      General Comments      Exercises General Exercises - Lower Extremity Ankle Circles/Pumps: AROM;Both;10 reps;Seated Heel Slides: AROM;Both;10 reps;Seated Straight Leg Raises: AROM;Both;10 reps;Seated      Assessment/Plan    PT Assessment Patient needs continued PT services  PT Diagnosis Difficulty walking;Acute pain;Hemiplegia dominant side;Generalized weakness   PT Problem List Decreased strength;Decreased activity tolerance;Decreased balance;Decreased mobility;Decreased coordination;Decreased knowledge of use of DME;Decreased knowledge of precautions  PT Treatment Interventions DME instruction;Gait training;Stair training;Functional mobility training;Therapeutic activities;Therapeutic exercise;Balance training;Neuromuscular re-education;Patient/family education   PT Goals (Current goals can be found in the Care Plan section) Acute Rehab PT Goals Patient Stated Goal: return home PT Goal Formulation: With patient/family Time For Goal Achievement: 12/25/15 Potential to Achieve Goals: Good    Frequency Min 3X/week   Barriers to discharge        Co-evaluation               End of Session Equipment Utilized During Treatment: Gait belt;Oxygen Activity Tolerance: Patient tolerated treatment well;Patient limited by fatigue Patient left: in chair;with call bell/phone within reach;with family/visitor present Nurse Communication: Mobility status         Time: 5465-6812 PT Time Calculation (min) (ACUTE ONLY): 28  min   Charges:   PT Evaluation $Initial PT Evaluation Tier I: 1 Procedure PT Treatments $Therapeutic Activity: 8-22 mins   PT G Codes:       Leighton Roach, PT  Acute Rehab Services  [phone]  Leighton Roach 12/10/2014, 12:35 PM

## 2014-12-10 NOTE — Progress Notes (Signed)
Pt VC 0.7L, and NIF -8cmH20. Pt states that his breathing is about the same at this point.

## 2014-12-10 NOTE — Progress Notes (Signed)
NIF/ VC done. NIF -9 and VC .7.  RN and wife at bedside.

## 2014-12-10 NOTE — Progress Notes (Signed)
Utilization Review Completed.  

## 2014-12-11 ENCOUNTER — Inpatient Hospital Stay (HOSPITAL_COMMUNITY): Payer: PPO

## 2014-12-11 LAB — GLUCOSE, CAPILLARY
GLUCOSE-CAPILLARY: 142 mg/dL — AB (ref 70–99)
GLUCOSE-CAPILLARY: 164 mg/dL — AB (ref 70–99)
GLUCOSE-CAPILLARY: 176 mg/dL — AB (ref 70–99)
GLUCOSE-CAPILLARY: 234 mg/dL — AB (ref 70–99)
Glucose-Capillary: 139 mg/dL — ABNORMAL HIGH (ref 70–99)
Glucose-Capillary: 214 mg/dL — ABNORMAL HIGH (ref 70–99)

## 2014-12-11 LAB — POCT I-STAT, CHEM 8
BUN: 38 mg/dL — AB (ref 6–23)
CHLORIDE: 102 mmol/L (ref 96–112)
Calcium, Ion: 1.12 mmol/L — ABNORMAL LOW (ref 1.13–1.30)
Creatinine, Ser: 0.9 mg/dL (ref 0.50–1.35)
Glucose, Bld: 253 mg/dL — ABNORMAL HIGH (ref 70–99)
HCT: 47 % (ref 39.0–52.0)
HEMOGLOBIN: 16 g/dL (ref 13.0–17.0)
Potassium: 3.4 mmol/L — ABNORMAL LOW (ref 3.5–5.1)
Sodium: 147 mmol/L — ABNORMAL HIGH (ref 135–145)
TCO2: 25 mmol/L (ref 0–100)

## 2014-12-11 LAB — CLOSTRIDIUM DIFFICILE BY PCR: CDIFFPCR: POSITIVE — AB

## 2014-12-11 MED ORDER — SODIUM CHLORIDE 0.9 % IV SOLN: 4.0000 g | Freq: Once | INTRAVENOUS | Status: DC

## 2014-12-11 MED ORDER — HEPARIN SODIUM (PORCINE) 1000 UNIT/ML IJ SOLN: 1000.0000 [IU] | Freq: Once | INTRAMUSCULAR | Status: DC

## 2014-12-11 MED ORDER — ACETAMINOPHEN 325 MG PO TABS: 650.0000 mg | ORAL_TABLET | ORAL | Status: DC | PRN

## 2014-12-11 MED ORDER — ACD FORMULA A 0.73-2.45-2.2 GM/100ML VI SOLN
500.0000 mL | Status: DC
  Filled 2014-12-11: qty 500

## 2014-12-11 MED ORDER — SODIUM CHLORIDE 0.9 % IV SOLN: INTRAVENOUS | Status: DC

## 2014-12-11 MED ORDER — SODIUM CHLORIDE 0.9 % IV SOLN
4.0000 g | Freq: Once | INTRAVENOUS | Status: DC
  Administered 2014-12-11: 4 g via INTRAVENOUS
  Filled 2014-12-11: qty 40

## 2014-12-11 MED ORDER — ALBUMIN HUMAN 25 % IV SOLN
50.0000 g | INTRAVENOUS | Status: DC
  Filled 2014-12-11 (×6): qty 200

## 2014-12-11 MED ORDER — SODIUM CHLORIDE 0.9 % IV SOLN
Freq: Once | INTRAVENOUS | Status: DC
  Filled 2014-12-11: qty 200

## 2014-12-11 MED ORDER — METRONIDAZOLE 500 MG PO TABS
500.0000 mg | ORAL_TABLET | Freq: Three times a day (TID) | ORAL | Status: DC
  Administered 2014-12-11 – 2014-12-17 (×18): 500 mg via ORAL
  Filled 2014-12-11 (×24): qty 1

## 2014-12-11 MED ORDER — CALCIUM CARBONATE ANTACID 500 MG PO CHEW
2.0000 | CHEWABLE_TABLET | ORAL | Status: AC
  Filled 2014-12-11 (×2): qty 2

## 2014-12-11 MED ORDER — ACD FORMULA A 0.73-2.45-2.2 GM/100ML VI SOLN: 500.0000 mL | Status: DC

## 2014-12-11 MED ORDER — ALBUMIN HUMAN 25 % IV SOLN: Freq: Once | INTRAVENOUS | Status: DC

## 2014-12-11 MED ORDER — ACD FORMULA A 0.73-2.45-2.2 GM/100ML VI SOLN
500.0000 mL | Status: DC
  Administered 2014-12-11 – 2014-12-16 (×2): 500 mL via INTRAVENOUS
  Filled 2014-12-11 (×3): qty 500

## 2014-12-11 MED ORDER — DIPHENHYDRAMINE HCL 25 MG PO CAPS: 25.0000 mg | ORAL_CAPSULE | Freq: Four times a day (QID) | ORAL | Status: DC | PRN

## 2014-12-11 MED ORDER — ALBUMIN HUMAN 25 % IV SOLN
INTRAVENOUS | Status: AC
  Administered 2014-12-11 (×3): via INTRAVENOUS_CENTRAL
  Filled 2014-12-11 (×3): qty 200

## 2014-12-11 MED ORDER — ALBUMIN HUMAN 25 % IV SOLN
50.0000 g | INTRAVENOUS | Status: AC
  Filled 2014-12-11 (×3): qty 200

## 2014-12-11 MED ORDER — SODIUM CHLORIDE 0.9 % IV SOLN
4.0000 g | Freq: Once | INTRAVENOUS | Status: AC
  Filled 2014-12-11: qty 40

## 2014-12-11 MED ORDER — PYRIDOSTIGMINE BROMIDE 60 MG PO TABS
60.0000 mg | ORAL_TABLET | Freq: Four times a day (QID) | ORAL | Status: DC
  Administered 2014-12-11 – 2014-12-14 (×12): 60 mg via NASOGASTRIC
  Filled 2014-12-11 (×16): qty 1

## 2014-12-11 MED ORDER — ANTICOAGULANT SODIUM CITRATE 4% (200MG/5ML) IV SOLN
5.0000 mL | Freq: Once | Status: AC
  Administered 2014-12-11: 5 mL via INTRAVENOUS
  Filled 2014-12-11 (×2): qty 250

## 2014-12-11 MED ORDER — ANTICOAGULANT SODIUM CITRATE 4% (200MG/5ML) IV SOLN
5.0000 mL | Freq: Once | Status: AC
  Filled 2014-12-11: qty 250

## 2014-12-11 MED ORDER — DIPHENHYDRAMINE HCL 25 MG PO CAPS
25.0000 mg | ORAL_CAPSULE | Freq: Four times a day (QID) | ORAL | Status: DC | PRN
Start: 2014-12-11 — End: 2014-12-11

## 2014-12-11 MED ORDER — ACD FORMULA A 0.73-2.45-2.2 GM/100ML VI SOLN
Status: AC
  Administered 2014-12-11: 500 mL
  Filled 2014-12-11: qty 1000

## 2014-12-11 NOTE — Evaluation (Signed)
Occupational Therapy Evaluation Patient Details Name: Craig [name] MRN: [id number] DOB: 1941/07/21 Today's Date: 12/11/2014    History of Present Illness Craig [name] is a 74 y.o. male with a past medical history of myasthenia gravis, history metastatic bladder cancer to abdominal wall, status post chemotherapy, history of atrial fibrillation chronic anticoagulation, presenting as a transfer from [facility] on 12/05/14. Patient having progressive weakness over the past month becoming worse in past week. Pt with acute MG exacerbation & respiratory failure. Decompensated requiring intubation 1/22 pm, extubated 12/08/14, currently on bipap PRN.    Clinical Impression   Pt was independent in self care prior to admission and walked with a cane.  Pt presents with generalized weakness, impaired balance and decreased activity tolerance interfering with ability to perform ADL and ADL transfers.  Pt will benefit from intense rehab prior to return home.  Will follow acutely.    Follow Up Recommendations  CIR;Supervision/Assistance - 24 hour    Equipment Recommendations  Tub/shower bench    Recommendations for Other Services       Precautions / Restrictions Precautions Precautions: Fall Restrictions Weight Bearing Restrictions: No      Mobility Bed Mobility Overal bed mobility: Needs Assistance Bed Mobility: Supine to Sit;Sit to Supine     Supine to sit: Min assist Sit to supine: Mod assist   General bed mobility comments: min assist with pad to rotate hips, min assist to raise trunk, used rail, HOB up  Transfers     Transfers: Sit to/from Stand Sit to Stand: Min assist;+2 safety/equipment;+2 physical assistance         General transfer comment: did not transfer, pt to have plasmaphoresis soon    Balance     Sitting balance-Leahy Scale: Fair       Standing balance-Leahy Scale: Poor                              ADL Overall ADL's : Needs  assistance/impaired Eating/Feeding: NPO   Grooming: Wash/dry hands;Wash/dry face;Minimal assistance;Sitting   Upper Body Bathing: Maximal assistance;Sitting   Lower Body Bathing: Maximal assistance;Sit to/from stand   Upper Body Dressing : Maximal assistance;Sitting   Lower Body Dressing: Maximal assistance;Sit to/from stand                 General ADL Comments: Able to cross foot over opposite knee in supine, but not sitting to access foot for ADL.     Vision                     Perception     Praxis      Pertinent Vitals/Pain Pain Assessment: No/denies pain     Hand Dominance Right   Extremity/Trunk Assessment Upper Extremity Assessment Upper Extremity Assessment: RUE deficits/detail;LUE deficits/detail RUE Deficits / Details: 2/5 shoulder, 4+/5 elbow to hand RUE Coordination: decreased fine motor;decreased gross motor LUE Deficits / Details: 3/5 shoulder, 4+/5 elbow to hand LUE Coordination: decreased fine motor;decreased gross motor   Lower Extremity Assessment Lower Extremity Assessment: Defer to PT evaluation   Cervical / Trunk Assessment Cervical / Trunk Assessment: Other exceptions Cervical / Trunk Exceptions: forward head   Communication Communication Communication: Expressive difficulties (slurred speech, low volume)   Cognition Arousal/Alertness: Awake/alert Behavior During Therapy: WFL for tasks assessed/performed Overall Cognitive Status: Within Functional Limits for tasks assessed  General Comments       Exercises       Shoulder Instructions      Home Living Family/patient expects to be discharged to:: Private residence Living Arrangements: Spouse/significant other Available Help at Discharge: Family;Available 24 hours/day Type of Home: House Home Access: Stairs to enter [employer] of Steps: 3 Entrance Stairs-Rails: Right Home Layout: One level     Bathroom Shower/Tub: Arts administrator: Standard     Home Equipment: Environmental consultant - 2 wheels;Cane - single point;Bedside commode   Additional Comments: wife reports that if he goes home, they will probably need tub bench      Prior Functioning/Environment Level of Independence: Independent with assistive device(s)        Comments: ambulated with cane, bathed and dressed self, fixed easy meals, wife drives    OT Diagnosis: Generalized weakness;Disturbance of vision   OT Problem List: Decreased strength;Decreased activity tolerance;Impaired balance (sitting and/or standing);Impaired vision/perception;Decreased coordination;Decreased knowledge of use of DME or AE;Cardiopulmonary status limiting activity;Obesity;Impaired UE functional use   OT Treatment/Interventions: Self-care/ADL training;DME and/or AE instruction;Therapeutic activities;Patient/family education;Balance training;Energy conservation;Therapeutic exercise    OT Goals(Current goals can be found in the care plan section) Acute Rehab OT Goals Patient Stated Goal: return home OT Goal Formulation: With patient Time For Goal Achievement: 12/25/14 Potential to Achieve Goals: Good ADL Goals Pt Will Perform Grooming: with min assist;standing Pt Will Perform Upper Body Bathing: with min assist;sitting Pt Will Perform Lower Body Bathing: with min assist;sit to/from stand Pt Will Perform Upper Body Dressing: with min assist;sitting Pt Will Perform Lower Body Dressing: with min assist;sit to/from stand Pt Will Transfer to Toilet: with min assist;ambulating;bedside commode (over toilet) Pt Will Perform Toileting - Clothing Manipulation and hygiene: with min assist;sit to/from stand Pt/caregiver will Perform Home Exercise Program: Increased strength;Both right and left upper extremity (AROM B shoulders) Additional ADL Goal #1: Pt will generalize energy conservation strategies in ADL and mobility with min verbal cues.  OT Frequency: Min 2X/week    Barriers to D/C:            Co-evaluation              End of Session Nurse Communication: Mobility status  Activity Tolerance: Patient limited by fatigue Patient left: in bed;with call bell/phone within reach;with family/visitor present   Time: 7517-0017 OT Time Calculation (min): 17 min Charges:  OT General Charges $OT Visit: 1 Procedure OT Evaluation $Initial OT Evaluation Tier I: 1 Procedure G-Codes:    Malka So 12/11/2014, 10:21 AM  [phone]

## 2014-12-11 NOTE — Progress Notes (Addendum)
I met with pt and his wife at bedside. We discussed a possible inpt rehab admission pending medical readiness and insurance approval which I will initiate today. Wife states pt is very frustrated due to the inability to communicate his needs verbally. I have contacted the acute therapy department to request SLP to assess for a communication board to ease pt in expressing his needs. I note SLP on 1/26 held evaluation due to medical issues. I will follow . [phone]

## 2014-12-11 NOTE — Progress Notes (Signed)
Inpatient Diabetes Program Recommendations  AACE/ADA: New Consensus Statement on Inpatient Glycemic Control (2013)  Target Ranges:  Prepandial:   less than 140 mg/dL      Peak postprandial:   less than 180 mg/dL (1-2 hours)      Critically ill patients:  140 - 180 mg/dL   Reason for Assessment:  Results for Craig [name], Craig [name] (MRN [id number]) as of 12/11/2014 10:22  Ref. Range 12/10/2014 08:09 12/10/2014 11:49 12/10/2014 15:31 12/10/2014 19:19 12/10/2014 23:39 12/11/2014 03:35 12/11/2014 07:36  Glucose-Capillary Latest Range: 70-99 mg/dL 145 (H) 157 (H) 241 (H) 232 (H) 185 (H) 164 (H) 139 (H)   Outpatient Diabetes medications: Starlix 120 mg tid, Metformin 500 mg bid Current orders for Inpatient glycemic control:  Novolog resistant q 4 hours  May consider adding low dose basal insulin. Consider Levemir 15 units daily.  Thanks, Adah Perl, RN, BC-ADM Inpatient Diabetes Coordinator Pager [phone]

## 2014-12-11 NOTE — Progress Notes (Addendum)
Subjective: Resting comfortably, no difficulty with breathing, taken off BiPAP.  No difficulty with secretions.   Objective: Current vital signs: BP 107/63 mmHg  Pulse 78  Temp(Src) 97.3 F (36.3 C) (Axillary)  Resp 19  Ht 5\' 7"  (1.702 m)  Wt 106.6 kg (235 lb 0.2 oz)  BMI 36.80 kg/m2  SpO2 96% Vital signs in last 24 hours: Temp:  [97.3 F (36.3 C)-98.5 F (36.9 C)] 97.3 F (36.3 C) (01/28 0749) Pulse Rate:  [67-84] 78 (01/28 0829) Resp:  [14-28] 19 (01/28 0829) BP: (93-128)/(59-78) 107/63 mmHg (01/28 0800) SpO2:  [94 %-99 %] 96 % (01/28 0829) FiO2 (%):  [60 %] 60 % (01/28 0800)  Intake/Output from previous day: 01/27 0701 - 01/28 0700 In: 1770 [I.V.:230; NG/GT:1540] Out: 1525 [Urine:1525] Intake/Output this shift: Total I/O In: 80 [I.V.:10; NG/GT:70] Out: -  Nutritional status: Diet NPO time specified  Neurologic Exam: Mental Status: Alert, oriented, Answers questions appropriately but having bulbar voice. Able to follow 3 step commands without difficulty. Cranial Nerves: II: Visual fields grossly normal, pupils equal, round, reactive to light and accommodation III,IV, VI: ptosis in right eye, decreased ability to abduct right eye. Left eye full EOM.  V,VII: smile symmetric, facial light touch sensation normal bilaterally VIII: hearing normal bilaterally IX,X: gag reflex present XI: bilateral shoulder shrug XII: midline tongue extension without atrophy or fasciculations  Motor: Able to lift right arm 4 inches off chair, left arm able to lift one inch off chair. Both legs show 4/5 strength. Neck flexion 4/5.  Sensory: Pinprick and light touch intact throughout, bilaterally Deep Tendon Reflexes:  Right: Upper Extremity Left: Upper extremity   biceps (C-5 to C-6) 2/4 biceps (C-5 to C-6) 2/4 tricep (C7) 2/4triceps (C7) 2/4 Brachioradialis (C6)  2/4Brachioradialis (C6) 2/4  Lower Extremity Lower Extremity  quadriceps (L-2 to L-4) 2/4 quadriceps (L-2 to L-4) 2/4    Lab Results: Basic Metabolic Panel:  Recent Labs Lab 12/06/14 0357 12/07/14 0400 12/07/14 1248 12/08/14 0352 12/09/14 0400 12/10/14 0359  NA 149* 149* 152* 150* 145 140  K 3.2* 3.5 3.9 4.0 3.1* 4.1  CL 114* 117* 112 117* 103 108  CO2 26 25  --  29 33* 28  GLUCOSE 189* 151* 165* 150* 141* 139*  BUN 26* 34* 36* 37* 26* 33*  CREATININE 1.33 1.06 1.00 0.87 0.70 0.84  CALCIUM 9.2 9.0  --  9.1 9.6 8.9  MG 1.7  --   --   --   --   --   PHOS 2.7  --   --   --   --   --     Liver Function Tests:  Recent Labs Lab 12/05/14 2015 12/06/14 0357  AST 42* 18  ALT 45 20  ALKPHOS 70 30*  BILITOT 0.9 0.9  PROT 6.8 5.4*  ALBUMIN 3.6 4.2   No results for input(s): LIPASE, AMYLASE in the last 168 hours. No results for input(s): AMMONIA in the last 168 hours.  CBC:  Recent Labs Lab 12/05/14 2015  12/06/14 0357 12/07/14 0400 12/07/14 1248 12/08/14 0352 12/09/14 0400  WBC 15.2*  --  15.9* 18.4*  --  15.6* 16.6*  NEUTROABS  --   --   --  15.7*  --   --   --   HGB 14.8  < > 14.7 13.2 15.0 14.2 15.9  HCT 43.2  < > 43.8 38.9* 44.0 42.5 46.4  MCV 92.5  --  93.0 93.1  --  95.1 93.4  PLT 220  --  199 161  --  109* 115*  < > = values in this interval not displayed.  Cardiac Enzymes: No results for input(s): CKTOTAL, CKMB, CKMBINDEX, TROPONINI in the last 168 hours.  Lipid Panel: No results for input(s): CHOL, TRIG, HDL, CHOLHDL, VLDL, LDLCALC in the last 168 hours.  CBG:  Recent Labs Lab 12/10/14 1531 12/10/14 1919 12/10/14 2339 12/11/14 0335 12/11/14 0736  GLUCAP 241* 232* 185* 164* 139*    Microbiology: Results for orders placed or performed during the hospital encounter of 12/05/14  MRSA PCR Screening     Status: None   Collection Time: 12/05/14  6:01 PM  Result Value Ref Range Status   MRSA by PCR  NEGATIVE NEGATIVE Final    Comment:        The GeneXpert MRSA Assay (FDA approved for NASAL specimens only), is one component of a comprehensive MRSA colonization surveillance program. It is not intended to diagnose MRSA infection nor to guide or monitor treatment for MRSA infections.   Culture, respiratory (NON-Expectorated)     Status: None   Collection Time: 12/06/14  3:55 AM  Result Value Ref Range Status   Specimen Description TRACHEAL ASPIRATE  Final   Special Requests Immunocompromised  Final   Gram Stain   Final    MODERATE WBC PRESENT, PREDOMINANTLY PMN FEW SQUAMOUS EPITHELIAL CELLS PRESENT MODERATE GRAM POSITIVE COCCI IN PAIRS IN CHAINS Performed at [facility]    Culture   Final    Non-Pathogenic Oropharyngeal-type Flora Isolated. Performed at [facility]    Report Status 12/08/2014 FINAL  Final    Coagulation Studies:  Recent Labs  12/09/14 0400  LABPROT 17.7*  INR 1.44    Imaging: Dg Chest Port 1 View  12/11/2014   CLINICAL DATA:  Follow-up of respiratory failure, history of CHF, myasthenia gravis, and bladder malignancy  EXAM: PORTABLE CHEST - 1 VIEW  COMPARISON:  Portable chest x-ray of December 09, 2014  FINDINGS: Significant change in positioning is noted on today's study. There is minimal increased density above the dome of the right hemidiaphragm consistent with atelectasis. On the left the retrocardiac region is dense. The left lateral costophrenic angle is excluded from the study. The heart is top-normal in size. The pulmonary vascularity is not engorged. The right internal jugular venous catheter tip projects over the proximal third of the SVC. The Port-A-Cath appliance tip projects over the middle third of the SVC. The esophagogastric tube tip projects below the inferior margin of the image.  IMPRESSION: Bibasilar atelectasis has developed. There is no evidence of CHF. No large pleural effusion is demonstrated. The support tubes and  lines are in appropriate position.   Electronically Signed   By: Dietrich Ke  [place]   On: 12/11/2014 08:01    Medications:  Scheduled: . amiodarone  100 mg Per Tube Daily  . amLODipine  5 mg Per Tube Daily  . antiseptic oral rinse  7 mL Mouth Rinse QID  . chlorhexidine  15 mL Mouth Rinse BID  . feeding supplement (PRO-STAT SUGAR FREE 64)  30 mL Per Tube QID  . feeding supplement (VITAL HIGH PROTEIN)  1,000 mL Per Tube Q24H  . free water  200 mL Per Tube 3 times per day  . insulin aspart  0-20 Units Subcutaneous 6 times per day  . methylPREDNISolone (SOLU-MEDROL) injection  60 mg Intravenous Daily  . pantoprazole sodium  40 mg Per Tube Daily  . rivaroxaban  20 mg Oral Q supper  . sodium chloride  10-40 mL Intracatheter Q12H    Assessment/Plan:  Impression: 74 yo M with MG crises. He will need to continue PLEX and steroids.   Recommendations: 1) PLEX QOD, Tx #4 today.  2) Continue solumedrol 60mg  daily 3) Frequent NIF and VC,, (last NIF -10)  4) Patient was on Mestinon 60 mg QID (per patients home medication list)at home with no SE.  I have called pharmacy and we are able to give this through his NG tube.  Will start him on his home dose.    Etta Quill PA-C [facility] [phone]  12/11/2014, 9:14 AM

## 2014-12-11 NOTE — Progress Notes (Signed)
NIF -10, VC 530ml

## 2014-12-11 NOTE — Progress Notes (Signed)
PULMONARY / CRITICAL CARE MEDICINE   Name: Craig [name] MRN: [id number] DOB: 1941/04/05    ADMISSION DATE:  12/05/2014 CONSULTATION DATE:  12/05/14  REFERRING MD :  Dr. Coralyn Pear  CHIEF COMPLAINT:  Respiratory Failure  INITIAL PRESENTATION: [name], never smoker, transferred to [facility] on 1/22 with acute MG exacerbation & respiratory failure.  Decompensated requiring intubation 1/22 pm.  Tx to ICU.    STUDIES:  1/22  CTA Chest >> neg for PE, bilateral lower lobe atelectasis, scarring, calcified plaque in LAD, L circ, L thyroid nodule  SIGNIFICANT EVENTS: 1/22  Admit to [facility] with acute MG exacerbation, acute respiratory failure.  Intubated pm of 1/22, tx to ICU 1/22 started plasmapheresis 1/23 PSV yesterday 7 hours, tired out, oozing from R IJ and blood tinged urine 1/25 Extubated. Tolerating initially. Subjective dyspnea without overt distress. PRN BiPAP 1/26, 1/27 Requiring intermittent NPPV but well supported on it. Overall stronger.  1/27 Transfer to SDU ordered. 1/28 Mestinon started by Neurology. C diff studies sent due to diarrhea without other symptoms. PCR positive. Metronidazole treatment initiated  SUBJECTIVE:  Continues to request intermittent PRN BiPAP. Continues to appear comfortable on [place] O2. Cough remains strong and effective  VITAL SIGNS: Temp:  [97.3 F (36.3 C)-98.5 F (36.9 C)] 97.8 F (36.6 C) (01/28 1144) Pulse Rate:  [67-84] 75 (01/28 1200) Resp:  [14-28] 19 (01/28 1200) BP: (93-128)/(60-78) 110/60 mmHg (01/28 1200) SpO2:  [95 %-99 %] 97 % (01/28 1200) FiO2 (%):  [60 %] 60 % (01/28 0800)   HEMODYNAMICS:     VENTILATOR SETTINGS: Vent Mode:  [-]  FiO2 (%):  [60 %] 60 %   INTAKE / OUTPUT:  Intake/Output Summary (Last 24 hours) at 12/11/14 1411 Last data filed at 12/11/14 1100  Gross per 24 hour  Intake   1550 ml  Output   1100 ml  Net    450 ml    PHYSICAL EXAMINATION: General: NAD Neuro: R lid lag persists, mild-mod decrease in BUE strength -  improved HEENT: NCAT Cardiovascular: IRIR, rate controlled Lungs:  Few rhonchi bilaterally Abdomen:  BS+, soft, nontender  Ext: no edema  LABS: I have reviewed all of today's lab results. Relevant abnormalities are discussed in the A/P section  CXR: increased bibasilar atx  ASSESSMENT / PLAN:  NEUROLOGIC A:   Acute exacerbation of myasthenia P:   RASS goal: 0 Minimize sedatives/analgesics Cont plasmapharesis per Neurology - today is # 5/5 Cont methylpred per Neurology PT input appreciated CIR consult - plan discharge to CIR when acute hospitalization needs have resolved  PULMONARY ETT 1/22 >> 1/25 A: Acute neuromuscular respiratory failure  P:   Cont to monitor closely in ICU/SDU level Cont supp O2 as needed Cont PRN BiPAP  CARDIOVASCULAR L CW Port  (chronic) R IJ HD Cath 1/22 >>  A:  Hypertension, controlled Chronic Atrial Fibrillation Bradycardia 1/23, resolved P:  Continue amiodarone 100 mg QD Cont amlodipine at reduced dose Cont holding metoprolol due to previous bradycardia Cont hemodynamic monitoring  Holding home lasix, metolazone   RENAL A:  AKI, resolved Hypernatremia, resolved Hypokalemia, recurrent P:   Monitor BMET intermittently Monitor I/Os Correct electrolytes as indicated  GASTROINTESTINAL A:   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 with bulbar symptoms H/O GERD, chronic PPI use P:   SUP: pantoprazole per tube Cont TFs per NGT SLP eval deferred until further clinical improvement  HEM/ONC A:   Metastatic Bladder Cancer Mild thrombocytopenia P:  Cont full anticoagulation for AF Monitor CBC intermittently Transfuse  per usual ICU guidelines  INFECTIOUS  A:   Steroid induced leukocytosis Positive C diff PCR - ? True positive P:    UA 1/22 >> neg  Sputum 1/22 >> NEG  Metronidazole 1/28 >> (14 days planned per protocol)  Monitor temp, WBC count Micro and abx as above  ENDOCRINE A:  DM  2, controlled P:   Holding starlix,  metformin  Cont SSI Q4    Merton Border, MD ; [facility] [phone].  After 5:30 PM or weekends, call [phone]

## 2014-12-11 NOTE — Progress Notes (Signed)
CRITICAL VALUE ALERT  Critical value received:  Positive CDIFF  Date of notification:  12/11/24  Time of notification: 1100  Critical value read back:Yes.    Nurse who received alert:  Lily Kocher  MD notified (1st page):  Dr simonds- no return call, didn't answer phone  Time of first page:  54  MD notified (2nd page): Dr Titus Mould  Time of second page: 1135  Responding MD:  Dr Titus Mould - orders given  Time MD responded:  1138

## 2014-12-11 NOTE — Progress Notes (Signed)
Pt taken off bipap & placed on 6lpm [place], tolerating well RN notified

## 2014-12-11 NOTE — Progress Notes (Signed)
NIF -20cmH20, and VC 1.2L. Effort has significantly improved since yesterday. Patient presents with a productive cough, mobilizing secretions effectively.

## 2014-12-11 NOTE — Progress Notes (Signed)
Physical Therapy Treatment Patient Details Name: Brysan Mcevoy MRN: [id number] DOB: 11/28/1940 Today's Date: 12/11/2014    History of Present Illness Tayshaun Kroh is a 74 y.o. male with a past medical history of myasthenia gravis, history metastatic bladder cancer to abdominal wall, status post chemotherapy, history of atrial fibrillation chronic anticoagulation, presenting as a transfer from [facility] on 12/05/14. Patient having progressive weakness over the past month becoming worse in past week. Pt with acute MG exacerbation & respiratory failure. Decompensated requiring intubation 1/22 pm, extubated 12/08/14, currently on bipap PRN. Pt now with C-diff.    PT Comments    Pt making good progress.  Follow Up Recommendations  CIR     Equipment Recommendations  Other (comment) (to be assessed.)    Recommendations for Other Services       Precautions / Restrictions Precautions Precautions: Fall    Mobility  Bed Mobility Overal bed mobility: Needs Assistance Bed Mobility: Supine to Sit;Sit to Supine     Supine to sit: Min assist Sit to supine: Mod assist   General bed mobility comments: Assist to bring trunk up and bring hips to EOB. Assist to control descent to trunk.  Transfers Overall transfer level: Needs assistance Equipment used: Rolling walker (2 wheeled) Transfers: Sit to/from Stand Sit to Stand: +2 physical assistance;Min assist         General transfer comment: Assist to bring hips up and for balance.  Ambulation/Gait Ambulation/Gait assistance: Min assist;+2 safety/equipment Ambulation Distance (Feet): 12 Feet Assistive device: Rolling walker (2 wheeled) Gait Pattern/deviations: Step-through pattern;Decreased step length - right;Decreased step length - left;Wide base of support;Trunk flexed   Gait velocity interpretation: Below normal speed for age/gender General Gait Details: Verbal cues to stand more erect. Followed with chair in case pt became  weak.   Stairs            Wheelchair Mobility    Modified Rankin (Stroke Patients Only)       Balance Overall balance assessment: Needs assistance Sitting-balance support: No upper extremity supported;Feet supported Sitting balance-Leahy Scale: Fair     Standing balance support: Bilateral upper extremity supported Standing balance-Leahy Scale: Poor Standing balance comment: walker and min assist for static standing.                    Cognition Arousal/Alertness: Awake/alert Behavior During Therapy: WFL for tasks assessed/performed Overall Cognitive Status: Within Functional Limits for tasks assessed                      Exercises      General Comments        Pertinent Vitals/Pain Pain Assessment: No/denies pain    Home Living                      Prior Function            PT Goals (current goals can now be found in the care plan section) Progress towards PT goals: Progressing toward goals    Frequency  Min 3X/week    PT Plan Current plan remains appropriate    Co-evaluation             End of Session Equipment Utilized During Treatment: Gait belt;Oxygen Activity Tolerance: Patient tolerated treatment well Patient left: in bed;with call bell/phone within reach     Time: 1452-1511 PT Time Calculation (min) (ACUTE ONLY): 19 min  Charges:  $Gait Training: 8-22 mins  G Codes:      Jagdeep Ancheta 12/11/2014, 3:41 PM  [facility] PT [phone]

## 2014-12-12 LAB — CBC
HCT: 45.3 % (ref 39.0–52.0)
Hemoglobin: 15.5 g/dL (ref 13.0–17.0)
MCH: 31.3 pg (ref 26.0–34.0)
MCHC: 34.2 g/dL (ref 30.0–36.0)
MCV: 91.5 fL (ref 78.0–100.0)
PLATELETS: 115 10*3/uL — AB (ref 150–400)
RBC: 4.95 MIL/uL (ref 4.22–5.81)
RDW: 13.1 % (ref 11.5–15.5)
WBC: 24.2 10*3/uL — AB (ref 4.0–10.5)

## 2014-12-12 LAB — GLUCOSE, CAPILLARY
GLUCOSE-CAPILLARY: 200 mg/dL — AB (ref 70–99)
Glucose-Capillary: 158 mg/dL — ABNORMAL HIGH (ref 70–99)
Glucose-Capillary: 132 mg/dL — ABNORMAL HIGH (ref 70–99)
Glucose-Capillary: 172 mg/dL — ABNORMAL HIGH (ref 70–99)
Glucose-Capillary: 180 mg/dL — ABNORMAL HIGH (ref 70–99)
Glucose-Capillary: 155 mg/dL — ABNORMAL HIGH (ref 70–99)

## 2014-12-12 LAB — BASIC METABOLIC PANEL
Anion gap: 5 (ref 5–15)
BUN: 33 mg/dL — ABNORMAL HIGH (ref 6–23)
CHLORIDE: 114 mmol/L — AB (ref 96–112)
CO2: 30 mmol/L (ref 19–32)
Calcium: 9.7 mg/dL (ref 8.4–10.5)
Creatinine, Ser: 0.82 mg/dL (ref 0.50–1.35)
GFR calc non Af Amer: 85 mL/min — ABNORMAL LOW (ref >90)
GLUCOSE: 179 mg/dL — AB (ref 70–99)
POTASSIUM: 3.3 mmol/L — AB (ref 3.5–5.1)
SODIUM: 149 mmol/L — AB (ref 135–145)

## 2014-12-12 MED ORDER — PREDNISONE 50 MG PO TABS
60.0000 mg | ORAL_TABLET | Freq: Every day | ORAL | Status: DC
  Administered 2014-12-12 – 2014-12-17 (×6): 60 mg via ORAL
  Filled 2014-12-12 (×9): qty 1

## 2014-12-12 MED ORDER — POTASSIUM CHLORIDE 20 MEQ/15ML (10%) PO SOLN
20.0000 meq | ORAL | Status: AC
Start: 2014-12-12 — End: 2014-12-12
  Administered 2014-12-12 (×2): 20 meq
  Filled 2014-12-12 (×2): qty 15

## 2014-12-12 MED ORDER — FREE WATER
200.0000 mL | Status: DC
  Administered 2014-12-12 – 2014-12-17 (×30): 200 mL

## 2014-12-12 MED ORDER — VITAL AF 1.2 CAL PO LIQD
1000.0000 mL | ORAL | Status: DC
  Administered 2014-12-12 – 2014-12-17 (×5): 1000 mL
  Filled 2014-12-12 (×11): qty 1000

## 2014-12-12 NOTE — Progress Notes (Signed)
NIF -60, VC 1.1L

## 2014-12-12 NOTE — Progress Notes (Signed)
[facility] ICU Electrolyte Replacement Protocol  Patient Name: Craig [name] DOB: 1941/06/23 MRN: [id number]  Date of Service  12/12/2014   HPI/Events of Note    Recent Labs Lab 12/06/14 0357 12/07/14 0400  12/08/14 0352 12/09/14 0400 12/10/14 0359 12/11/14 1932 12/12/14 0425  NA 149* 149*  < > 150* 145 140 147* 149*  K 3.2* 3.5  < > 4.0 3.1* 4.1 3.4* 3.3*  CL 114* 117*  < > 117* 103 108 102 114*  CO2 26 25  --  29 33* 28  --  30  GLUCOSE 189* 151*  < > 150* 141* 139* 253* 179*  BUN 26* 34*  < > 37* 26* 33* 38* 33*  CREATININE 1.33 1.06  < > 0.87 0.70 0.84 0.90 0.82  CALCIUM 9.2 9.0  --  9.1 9.6 8.9  --  9.7  MG 1.7  --   --   --   --   --   --   --   PHOS 2.7  --   --   --   --   --   --   --   < > = values in this interval not displayed.  Estimated Creatinine Clearance: 91.4 mL/min (by C-G formula based on Cr of 0.82).  Intake/Output      01/28 0701 - 01/29 0700   I.V. (mL/kg) 230 (2.2)   NG/GT 1730   Total Intake(mL/kg) 1960 (18.6)   Urine (mL/kg/hr) 1275 (0.5)   Stool 0 (0)   Total Output 1275   Net +685       Stool Occurrence 7 x    - I/O DETAILED x24h    Total I/O In: 950 [I.V.:110; NG/GT:840] Out: -  - I/O THIS SHIFT    ASSESSMENT   eICURN Interventions  ICU Electrolyte Replacement Protocol criteria met. Labs Replaced per protocol. MD notified   ASSESSMENT: MAJOR ELECTROLYTE    Lorene Dy 12/12/2014, 6:11 AM

## 2014-12-12 NOTE — Consult Note (Signed)
Patient screened for [facility] Care Management services on behalf of his [employer] insurance. Spoke with patient and wife at bedside to offer and explain [facility] Care Management services. Consents obtained. Patient will receive post hospital discharge call and will be evaluated for monthly home visits when he eventually goes home. Noted patient may go to inpatient rehab. Will continue to follow. Left [facility] Care Management packet at bedside.  Marthenia Rolling, [facility] [phone]

## 2014-12-12 NOTE — Progress Notes (Signed)
PULMONARY / CRITICAL CARE MEDICINE   Name: Craig [name] MRN: [id number] DOB: 12/18/1940    ADMISSION DATE:  12/05/2014 CONSULTATION DATE:  12/05/14  REFERRING MD :  Dr. Coralyn Pear  CHIEF COMPLAINT:  Respiratory Failure  INITIAL PRESENTATION: [name], never smoker, transferred to [facility] on 1/22 with acute MG exacerbation & respiratory failure.  Decompensated requiring intubation 1/22 pm.  Tx to ICU.    STUDIES:  1/22  CTA Chest >> neg for PE, bilateral lower lobe atelectasis, scarring, calcified plaque in LAD, L circ, L thyroid nodule  SIGNIFICANT EVENTS: 1/22  Admit to [facility] with acute MG exacerbation, acute respiratory failure.  Intubated pm of 1/22, tx to ICU 1/22 started plasmapheresis 1/23 PSV yesterday 7 hours, tired out, oozing from R IJ and blood tinged urine 1/25 Extubated. Tolerating initially. Subjective dyspnea without overt distress. PRN BiPAP 1/26, 1/27 Requiring intermittent NPPV but well supported on it. Overall stronger.  1/27 Transfer to SDU ordered. 1/28 Mestinon started by Neurology. C diff studies sent due to diarrhea without other symptoms. C diff PCR positive. Metronidazole treatment initiated 1/29 NIF markedly improved. Transfer to med-surg ordered. TRH to resume primary duties as of AM 1/30.   SUBJECTIVE:  No longer requesting BiPAP. No distress. No new complaints  VITAL SIGNS: Temp:  [97.4 F (36.3 C)-98 F (36.7 C)] 97.6 F (36.4 C) (01/29 1229) Pulse Rate:  [60-89] 77 (01/29 1200) Resp:  [16-25] 25 (01/29 1200) BP: (104-129)/(48-73) 127/67 mmHg (01/29 1200) SpO2:  [92 %-100 %] 94 % (01/29 1200) FiO2 (%):  [60 %] 60 % (01/29 0200) Weight:  [105.416 kg (232 lb 6.4 oz)] 105.416 kg (232 lb 6.4 oz) (01/29 0430)   HEMODYNAMICS:     VENTILATOR SETTINGS: Vent Mode:  [-]  FiO2 (%):  [60 %] 60 %   INTAKE / OUTPUT:  Intake/Output Summary (Last 24 hours) at 12/12/14 1312 Last data filed at 12/12/14 1200  Gross per 24 hour  Intake   2070 ml  Output   1725 ml   Net    345 ml    PHYSICAL EXAMINATION: General: NAD Neuro: R lid lag persists, mild-mod decrease in BUE strength - improved HEENT: NCAT Cardiovascular: IRIR, rate controlled Lungs:  Few rhonchi bilaterally Abdomen:  BS+, soft, nontender  Ext: no edema  LABS: I have reviewed all of today's lab results. Relevant abnormalities are discussed in the A/P section  CXR: NNF  ASSESSMENT / PLAN:  NEUROLOGIC A:   Acute exacerbation of myasthenia P:   Minimize sedatives/analgesics Possible further plasmapharesis per Neurology Cont steroids per Neurology PT following CIR consult - plan discharge to CIR when acute hospitalization needs have resolved  PULMONARY ETT 1/22 >> 1/25 A: Acute neuromuscular respiratory failure, much improved P:   Cont to monitor closely in ICU/SDU level Cont supp O2 as needed Cont PRN BiPAP  CARDIOVASCULAR L CW Port  (chronic) R IJ HD Cath 1/22 >>  A:  Hypertension, controlled Chronic Atrial Fibrillation Bradycardia 1/23, resolved P:  Continue amiodarone 100 mg QD Cont amlodipine at reduced dose Cont holding metoprolol due to previous bradycardia Cont hemodynamic monitoring  Holding home lasix, metolazone   RENAL A:  AKI, resolved Hypernatremia, resolved Hypokalemia, recurrent P:   Monitor BMET intermittently Monitor I/Os Correct electrolytes as indicated  GASTROINTESTINAL A:   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 with bulbar symptoms H/O GERD, chronic PPI use P:   SUP: pantoprazole per tube - change to PO after NGT removed Cont TFs per NGT SLP eval ordered  1/29  HEM/ONC A:   Metastatic Bladder Cancer Mild thrombocytopenia P:  Cont full anticoagulation for AF Monitor CBC intermittently Transfuse per usual guidelines  INFECTIOUS  A:   Steroid induced leukocytosis Positive C diff PCR - ? True positive P:    UA 1/22 >> neg  Sputum 1/22 >> NEG C diff 1/28 >> POS  Metronidazole 1/28 >> (14 days planned per protocol)  Monitor  temp, WBC count Micro and abx as above  ENDOCRINE A:  DM  2, controlled P:   Holding starlix, metformin  Cont SSI Q4 - change to ACHS if/when PO diet started  Transfer to med-surg ordered. TRH to resume primary duties as of AM 1/30 and PCCM to sign off. Discussed with Dr Marton Redwood, MD ; [facility] [phone].  After 5:30 PM or weekends, call [phone]

## 2014-12-12 NOTE — Progress Notes (Signed)
Occupational Therapy Treatment Patient Details Name: Craig [name] MRN: [id number] DOB: 05-17-1941 Today's Date: 12/12/2014    History of present illness Craig [name] is a 74 y.o. 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 Patient having progressive weakness over the past month becoming worse in past week. Pt with acute MG exacerbation & respiratory failure. Decompensated requiring intubation 1/22 pm, extubated 12/08/14, currently on bipap PRN. Pt now with C-diff.   OT comments  Focus of session on seated ADL at EOB and UE exercise in supine. Pt with improvement in B shoulder strength.  Instructed wife in shoulder exercises in bed and how to grade exercise to increase difficulty.  Instructed pt to perform UE exercises 3x per day and in the importance of OOB to chair over the weekend. Pt is highly motivated to return home to see his grandchildren.  Follow Up Recommendations  CIR;Supervision/Assistance - 24 hour    Equipment Recommendations  Tub/shower bench    Recommendations for Other Services      Precautions / Restrictions Precautions Precautions: Fall       Mobility Bed Mobility Overal bed mobility: Needs Assistance Bed Mobility: Supine to Sit;Sit to Supine     Supine to sit: Min assist Sit to supine: Mod assist   General bed mobility comments: assist for trunk and to scoot hips to EOB with pad, assist for LEs when returning to supine  Transfers                      Balance     Sitting balance-Leahy Scale: Fair                             ADL Overall ADL's : Needs assistance/impaired Eating/Feeding: NPO   Grooming: Wash/dry face;Brushing hair;Oral care;Sitting;Minimal assistance                                         Vision                     Perception     Praxis      Cognition   Behavior During Therapy:  (labile) Overall Cognitive Status: Within Functional Limits for tasks assessed                       Extremity/Trunk Assessment               Exercises General Exercises - Upper Extremity Shoulder Flexion: Strengthening;Both;10 reps;AAROM;Supine Shoulder Extension: AROM;Both;10 reps;Supine Elbow Flexion: AROM;Both;10 reps;Supine Elbow Extension: AROM;Strengthening;Both;10 reps;Supine   Shoulder Instructions       General Comments      Pertinent Vitals/ Pain       Pain Assessment: No/denies pain  Home Living                                          Prior Functioning/Environment              Frequency Min 2X/week     Progress Toward Goals  OT Goals(current goals can now be found in the care  plan section)  Progress towards OT goals: Progressing toward goals  Acute Rehab OT Goals Patient Stated Goal: return home  Plan Discharge plan remains appropriate    Co-evaluation                 End of Session     Activity Tolerance Patient limited by fatigue   Patient Left in bed;with call bell/phone within reach;with family/visitor present   Nurse Communication Mobility status        Time: 4496-7591 OT Time Calculation (min): 27 min  Charges: OT General Charges $OT Visit: 1 Procedure OT Treatments $Self Care/Home Management : 8-22 mins $Therapeutic Exercise: 8-22 mins  Malka So 12/12/2014, 4:15 PM  [phone]

## 2014-12-12 NOTE — Progress Notes (Signed)
NIF -20, FVC .80

## 2014-12-12 NOTE — Evaluation (Signed)
Clinical/Bedside Swallow Evaluation Patient Details  Name: Craig [name] MRN: [id number] Date of Birth: 1941-04-16  Today's Date: 12/12/2014 Time: SLP Start Time (ACUTE ONLY): 1344 SLP Stop Time (ACUTE ONLY): 1405 SLP Time Calculation (min) (ACUTE ONLY): 21 min  Past Medical History:  Past Medical History  Diagnosis Date  . Atrial fibrillation, chronic     on Xarelto   . HTN (hypertension)   . CHF (congestive heart failure)   . Diabetes mellitus   . 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 Abdominal wall metastasis removal     HPI:  [name], never smoker, transferred to [facility] on 1/22 with acute MG exacerbation & respiratory failure. Decompensated requiring intubation 1/22-1/25.   Assessment / Plan / Recommendation Clinical Impression  Pt has generalized oral weakness which limits his ability to orally manipulate POs. He reports difficulty with lingual coordination and propulsion with single ice chips, ultimately allowing the ice chip to melt and the thin liquid to then slide posteriorly into his pharynx. Up to 5 swallows are noted per ice chip, with immediate throat clearing following each trial. Pt is not yet ready for PO diet - recommend to continue NPO with alternative means for nutrition and medications. SLP to follow for PO readiness, although suspect pt will likely require objective testing prior to diet initiation.     Aspiration Risk  Severe    Diet Recommendation NPO;Alternative means - temporary   Medication Administration: Via alternative means    Other  Recommendations Oral Care Recommendations: Oral care Q4 per protocol   Follow Up Recommendations  Inpatient Rehab    Frequency and Duration min 3x week  1 week   Pertinent Vitals/Pain n/a    SLP Swallow Goals     Swallow Study Prior Functional Status       General Date of Onset:  12/05/14 HPI: [name], never smoker, transferred to [facility] on 1/22 with acute MG exacerbation & respiratory failure. Decompensated requiring intubation 1/22-1/25. Type of Study: Bedside swallow evaluation Previous Swallow Assessment: none in chart Diet Prior to this Study: NPO;Panda Temperature Spikes Noted: No Respiratory Status: Nasal cannula History of Recent Intubation: Yes Length of Intubations (days): 4 days Date extubated: 12/08/14 Behavior/Cognition: Alert;Cooperative;Pleasant mood Oral Cavity - Dentition: Adequate natural dentition;Missing dentition Self-Feeding Abilities: Total assist Patient Positioning: Upright in bed Baseline Vocal Quality: Low vocal intensity Volitional Swallow: Able to elicit    Oral/Motor/Sensory Function Overall Oral Motor/Sensory Function: Impaired (generalized weakness)   Ice Chips Ice chips: Impaired Presentation: Spoon Oral Phase Impairments: Reduced lingual movement/coordination;Impaired anterior to posterior transit Pharyngeal Phase Impairments: Decreased hyoid-laryngeal movement;Throat Clearing - Immediate;Other (comments) (multiple swallows (up to 5 per ice chip))   Thin Liquid Thin Liquid: Not tested    Nectar Thick Nectar Thick Liquid: Not tested   Honey Thick Honey Thick Liquid: Not tested   Puree Puree: Not tested   Solid    Solid: Not tested      Germain Osgood, M.A. CCC-SLP [phone]  Germain Osgood 12/12/2014,2:17 PM

## 2014-12-12 NOTE — Progress Notes (Signed)
Subjective: Much improved.   Exam: Filed Vitals:   12/12/14 1400  BP:   Pulse:   Temp: 97.4 F (36.3 C)  Resp:    Gen: In bed, NAD MS: Awake, alert, interactive and appropriate ID:[id number], he has improved EOM, but still restricted in the right eye.  Motor: 3/5 neck flexion, weakness of bilateral arm raising. Good strength in his legs.  Sensory:intact to LT  Impression: 74 yo M with MG and improvement following PLEX.   Recommendations: 1) Will continue to monitor as improvement can continue following PLEX.  2) continue mestinon.   Roland Rack, MD [facility] [phone]  If 7pm- 7am, please page neurology on call as listed in [place].

## 2014-12-12 NOTE — Progress Notes (Signed)
NUTRITION FOLLOW UP  Intervention:   Change formula to Vital AF 1.2. Start Vital AF 1.2 @ 50 ml/hr via NGT and increase by 10 ml every 4 hours to goal rate of 65 ml/hr.   Tube feeding regimen provides 1872 kcal (100% of needs), 117 grams of protein, and 1264 ml of H2O.   TF+ free water flushes (200 ml q 4 hrs) will provide 2464 ml of water daily.    Nutrition Dx:   Inadequate oral intake related to inability to eat as evidenced by NPO/Vent status; ongoing- extubated but remains NPO  Goal:   Enteral nutrition to provide 60-70% of estimated calorie needs (22-25 kcals/kg ideal body weight) and 100% of estimated protein needs, based on ASPEN guidelines for hypocaloric, high protein feeding in critically ill obese individuals; discontinued  Goal:   Pt to meet >/= 90% of their estimated nutrition needs; unmet  Monitor:   Vent status, TF rate/tolerance, weight trend, labs  Assessment:   [name], never smoker, transferred to [facility] on 1/22 with acute MG exacerbation & respiratory failure. Decompensated requiring intubation 1/22 pm. Hx of diabetes, HTN, and CHF.   Pt was extubated 1/25. Remains on TF via NGT due to dysphagia. Per MD note, SLP eval deferred until further clinical improvement. Pt is currently receiving Vital High Protein @ 40 ml/hr with Pro-stat QID. Tube feeding regimen provides 1360 kcal (71% of needs), 144 grams of protein, and 806 ml of H2O. Free water flushes, 200 ml every 4 hours, provides an additional 1200 ml of water, for a total of 2006 ml of water daily.  Per nursing notes, gastric residuals have been 0-60 ml. Pt denies any nausea or abdominal pain. His weight has dropped 6 lbs in the past week. Increasing TF's to better meet re-estimated energy and protein needs.   Labs: high sodium, low potassium, elevated glucose  Height: Ht Readings from Last 1 Encounters:  12/05/14 5\' 7"  (1.702 m)    Weight Status:   Wt Readings from Last 1 Encounters:  12/12/14 232 lb 6.4  oz (105.416 kg)    Re-estimated needs:  Kcal: 1850-2100 Protein: 105-115 grams Fluid: 2.6 L/day  Skin: +2 generalized edema  Diet Order:   NPO   Intake/Output Summary (Last 24 hours) at 12/12/14 0943 Last data filed at 12/12/14 0900  Gross per 24 hour  Intake   1980 ml  Output   1750 ml  Net    230 ml    Last BM: 1/28   Labs:   Recent Labs Lab 12/06/14 0357  12/09/14 0400 12/10/14 0359 12/11/14 1932 12/12/14 0425  NA 149*  < > 145 140 147* 149*  K 3.2*  < > 3.1* 4.1 3.4* 3.3*  CL 114*  < > 103 108 102 114*  CO2 26  < > 33* 28  --  30  BUN 26*  < > 26* 33* 38* 33*  CREATININE 1.33  < > 0.70 0.84 0.90 0.82  CALCIUM 9.2  < > 9.6 8.9  --  9.7  MG 1.7  --   --   --   --   --   PHOS 2.7  --   --   --   --   --   GLUCOSE 189*  < > 141* 139* 253* 179*  < > = values in this interval not displayed.  CBG (last 3)   Recent Labs  12/11/14 1924 12/11/14 2341 12/12/14 0400  GLUCAP 234* 176* 158*  Scheduled Meds: . amiodarone  100 mg Per Tube Daily  . amLODipine  5 mg Per Tube Daily  . antiseptic oral rinse  7 mL Mouth Rinse QID  . chlorhexidine  15 mL Mouth Rinse BID  . feeding supplement (PRO-STAT SUGAR FREE 64)  30 mL Per Tube QID  . feeding supplement (VITAL HIGH PROTEIN)  1,000 mL Per Tube Q24H  . free water  200 mL Per Tube Q4H  . insulin aspart  0-20 Units Subcutaneous 6 times per day  . metroNIDAZOLE  500 mg Oral 3 times per day  . pantoprazole sodium  40 mg Per Tube Daily  . potassium chloride  20 mEq Per Tube Q4H  . predniSONE  60 mg Oral Q breakfast  . pyridostigmine  60 mg Per NG tube QID  . rivaroxaban  20 mg Oral Q supper  . sodium chloride  10-40 mL Intracatheter Q12H    Continuous Infusions: . sodium chloride 10 mL/hr at 12/11/14 2200  . citrate dextrose      Pryor Ochoa RD, LDN Inpatient Clinical Dietitian Pager: [phone] After Hours Pager: [phone]

## 2014-12-13 LAB — BASIC METABOLIC PANEL
Anion gap: 4 — ABNORMAL LOW (ref 5–15)
BUN: 27 mg/dL — AB (ref 6–23)
CO2: 28 mmol/L (ref 19–32)
Calcium: 9.7 mg/dL (ref 8.4–10.5)
Chloride: 115 mmol/L — ABNORMAL HIGH (ref 96–112)
Creatinine, Ser: 0.8 mg/dL (ref 0.50–1.35)
GFR calc non Af Amer: 86 mL/min — ABNORMAL LOW (ref >90)
GLUCOSE: 141 mg/dL — AB (ref 70–99)
Potassium: 3.3 mmol/L — ABNORMAL LOW (ref 3.5–5.1)
Sodium: 147 mmol/L — ABNORMAL HIGH (ref 135–145)

## 2014-12-13 LAB — GLUCOSE, CAPILLARY
GLUCOSE-CAPILLARY: 267 mg/dL — AB (ref 70–99)
Glucose-Capillary: 132 mg/dL — ABNORMAL HIGH (ref 70–99)
Glucose-Capillary: 126 mg/dL — ABNORMAL HIGH (ref 70–99)
Glucose-Capillary: 231 mg/dL — ABNORMAL HIGH (ref 70–99)

## 2014-12-13 LAB — RETICULOCYTES
RBC.: 5.06 MIL/uL (ref 4.22–5.81)
Retic Count, Absolute: 65.8 10*3/uL (ref 19.0–186.0)
Retic Ct Pct: 1.3 % (ref 0.4–3.1)

## 2014-12-13 LAB — CBC
HEMATOCRIT: 47.4 % (ref 39.0–52.0)
HEMOGLOBIN: 16.2 g/dL (ref 13.0–17.0)
MCH: 32 pg (ref 26.0–34.0)
MCHC: 34.2 g/dL (ref 30.0–36.0)
MCV: 93.7 fL (ref 78.0–100.0)
Platelets: 160 10*3/uL (ref 150–400)
RBC: 5.06 MIL/uL (ref 4.22–5.81)
RDW: 13.6 % (ref 11.5–15.5)
WBC: 22.8 10*3/uL — ABNORMAL HIGH (ref 4.0–10.5)

## 2014-12-13 LAB — POCT I-STAT, CHEM 8
BUN: 27 mg/dL — AB (ref 6–23)
CALCIUM ION: 1.13 mmol/L (ref 1.13–1.30)
CHLORIDE: 105 mmol/L (ref 96–112)
Creatinine, Ser: 0.9 mg/dL (ref 0.50–1.35)
GLUCOSE: 164 mg/dL — AB (ref 70–99)
HCT: 50 % (ref 39.0–52.0)
Hemoglobin: 17 g/dL (ref 13.0–17.0)
Potassium: 3.2 mmol/L — ABNORMAL LOW (ref 3.5–5.1)
Sodium: 151 mmol/L — ABNORMAL HIGH (ref 135–145)
TCO2: 23 mmol/L (ref 0–100)

## 2014-12-13 MED ORDER — ACD FORMULA A 0.73-2.45-2.2 GM/100ML VI SOLN
Status: AC
Start: 2014-12-13 — End: 2014-12-13
  Filled 2014-12-13: qty 500

## 2014-12-13 MED ORDER — ACD FORMULA A 0.73-2.45-2.2 GM/100ML VI SOLN
500.0000 mL | Status: DC
Start: 2014-12-13 — End: 2014-12-13
  Administered 2014-12-13: 500 mL via INTRAVENOUS
  Filled 2014-12-13: qty 500

## 2014-12-13 MED ORDER — ACD FORMULA A 0.73-2.45-2.2 GM/100ML VI SOLN
Status: AC
  Filled 2014-12-13: qty 500

## 2014-12-13 MED ORDER — POTASSIUM CHLORIDE CRYS ER 20 MEQ PO TBCR: 40.0000 meq | EXTENDED_RELEASE_TABLET | Freq: Once | ORAL | Status: DC

## 2014-12-13 MED ORDER — SODIUM CHLORIDE 0.9 % IV SOLN
INTRAVENOUS | Status: AC
  Administered 2014-12-13 (×3): via INTRAVENOUS_CENTRAL
  Filled 2014-12-13 (×3): qty 200

## 2014-12-13 MED ORDER — CALCIUM GLUCONATE 10 % IV SOLN
4.0000 g | Freq: Once | INTRAVENOUS | Status: AC
  Administered 2014-12-13: 4 g via INTRAVENOUS
  Filled 2014-12-13: qty 40

## 2014-12-13 MED ORDER — POTASSIUM CHLORIDE 20 MEQ/15ML (10%) PO SOLN
40.0000 meq | Freq: Once | ORAL | Status: AC
  Administered 2014-12-13: 40 meq via ORAL
  Filled 2014-12-13 (×2): qty 30

## 2014-12-13 MED ORDER — ANTICOAGULANT SODIUM CITRATE 4% (200MG/5ML) IV SOLN
5.0000 mL | Freq: Once | Status: AC
  Administered 2014-12-13: 5 mL
  Filled 2014-12-13 (×2): qty 250

## 2014-12-13 MED ORDER — WHITE PETROLATUM GEL
Status: AC
  Administered 2014-12-14: 0.2
  Filled 2014-12-13: qty 1

## 2014-12-13 MED ORDER — DIPHENHYDRAMINE HCL 25 MG PO CAPS: 25.0000 mg | ORAL_CAPSULE | Freq: Four times a day (QID) | ORAL | Status: DC | PRN

## 2014-12-13 MED ORDER — CALCIUM CARBONATE ANTACID 500 MG PO CHEW
2.0000 | CHEWABLE_TABLET | ORAL | Status: DC
Start: 2014-12-13 — End: 2014-12-13

## 2014-12-13 MED ORDER — ACETAMINOPHEN 325 MG PO TABS: 650.0000 mg | ORAL_TABLET | ORAL | Status: DC | PRN

## 2014-12-13 NOTE — Progress Notes (Signed)
Speech Language Pathology Treatment: Dysphagia  Patient Details Name: Craig [name] MRN: [id number] DOB: 05/21/1941 Today's Date: 12/13/2014 Time: 1751-0258 SLP Time Calculation (min) (ACUTE ONLY): 22 min  Assessment / Plan / Recommendation Clinical Impression  Dysphagia treatment provided today to assess PO readiness and provide education. Pt tolerated oral care with suction prior to PO intake. Pt continues to demonstrate immediate s/s of aspiration- today with ~50% of trials with ice chips. Also attempted very small amount of puree today; pt reported that "it got stuck"- was able to cough/ clear throat and material was suctioned from mouth. Given these findings, pt continues to be at severe risk of aspiration with all PO intake. Recommend continuing strict NPO, meds via alternative means, with frequent oral care AT LEAST 4 times a day- it seems that pt's speech today was affected by extremely dry mouth. Will continue to follow for PO readiness.   HPI HPI: [name], never smoker, transferred to [facility] on 1/22 with acute MG exacerbation & respiratory failure. Decompensated requiring intubation 1/22-1/25.   Pertinent Vitals Pain Assessment: No/denies pain  SLP Plan  Continue with current plan of care    Recommendations Diet recommendations: NPO Medication Administration: Via alternative means              Oral Care Recommendations: Oral care Q4 per protocol Follow up Recommendations: Inpatient Rehab Plan: Continue with current plan of care    GO     Kern Reap, MA, CCC-SLP 12/13/2014, 10:55 AM

## 2014-12-13 NOTE — Progress Notes (Signed)
Pt has home CPAP mask in room but states he does not wish to wear tonight. Pt encouraged to call RT if pt changes mind. No distress noted at this time.

## 2014-12-13 NOTE — Progress Notes (Signed)
vc .79 and NIF -20.  Pt had good effort

## 2014-12-13 NOTE — Progress Notes (Addendum)
[facility] PROGRESS NOTE  [name] XWR:[id number] DOB: 07/06/1941 DOA: 12/05/2014 PCP: Ann Held, MD  Brief narrative 74 year old male with  past medical history of myasthenia gravis, metastatic bladder cancer to abdominal wall status post chemotherapy, history of A. fib on chronic anticoagulation who was transferred from [facility] with progressive weakness for the past 1 month . Patient was started on 60 mg oral prednisone in addition to Mestinon therapy by his neurologist despite which his symptoms continue to worsen with difficulty swallowing and progressive shortness of breath. Patient was transferred to [facility] and admitted to  ICU for acute  respiratory failure secondary to acute Myasthenia crisis requiring intubation on 12/05/2014. Started on plasmapheresis on admission. He was successfully extubated on 1/25 followed by when necessary BiPAP. Since then he has been requiring intermittent NPPV and clinically improved. Patient has residual dysphagia, speech impairment, right eye ptosis and proximal upper extremity weakness .Patient started on Mestinon on 1/28 along with prednisone. Neurology closely following. Slow improvement in NIF.Transferred to telemetry.      Assessment/Plan: Acute exacerbation of myasthenia gravis Patient receiving daily plasmapheresis. Continue steroid and Mestinon. Continue PT eval. D/c to CIR  once medically stable As per wife he has increased his abnormality today. Will verify with neurology. Proximal muscle strength appears to be improving. -Discussed with neurology on the phone and reassured that patient has shown significant improvement over the last few days (improved strength, improved extraocular Muscle movement , improved speech and resolved diplopia) Continue close monitoring of VC and NIF.  Dysphagia Secondary to myasthenia gravis flareup with bulbar symptoms Currently receiving NG feeds. Continue PPI Speech and swallow  following.  C. difficile diarrhea Diarrhea improved. On empiric Flagyl with plan on 14 day course.( since 1/28--)  Leukocytosis Likely related to steroid and  C. difficile. Continue to monitor  Hypokalemia Replenished  Type 2 diabetes mellitus Stable. Holding home medication. Continue sliding scale insulin   Metastatic bladder cancer.  chemotherapy as outpatient.  Atrial fibrillation On Xarelto at home which has been resumed Continue amiodarone    Hypertension Blood pressure stable. Continue amlodipine.   Diet: Tube feeds DVT prophylaxis: Xarelto  Code Status: Full code   Family Communication: wife at bedside  Disposition Plan: currently inpt. Possible inpt rehab upon discharge.   Consultants:  PCCM  Neurology   renal  Procedures:  intubation on 1/22  plasmapharesis  Antibiotics:  Flagyl 1/28--  HPI/Subjective: Patient seen and examined. Denies any diarrhea today. Reports breathing to be better. Still has weakness on his upper extremity proximal muscles.  Objective: Filed Vitals:   12/13/14 1315  BP: 97/64  Pulse: 89  Temp:   Resp: 20    Intake/Output Summary (Last 24 hours) at 12/13/14 1334 Last data filed at 12/13/14 1130  Gross per 24 hour  Intake  13399 ml  Output   1650 ml  Net  11749 ml   Filed Weights   12/10/14 0500 12/12/14 0430 12/13/14 1115  Weight: 106.6 kg (235 lb 0.2 oz) 105.416 kg (232 lb 6.4 oz) 105.8 kg (233 lb 4 oz)    Exam:   General:  Elderly male lying in bed appears fatigued  HEENT: NG in place, right eye ptosis, moist oral mucosa  Chest: Clear to auscultation bilaterally, no added sounds  CVS: Normal S1 and S2, no murmurs rub or gallop  Abdomen: Soft, nondistended, nontender, bowel sounds present  Extremities: Warm, no edema  CNS: Alert and oriented, good movement of EOM, slurred speech,unable to move  arms above the shoulder but able to keep it in position when elevated ( this seems to have improved  compared to yesterday), good strength in bilateral lower extremities and normal sensations.  Data Reviewed: Basic Metabolic Panel:  Recent Labs Lab 12/08/14 0352 12/09/14 0400 12/10/14 0359 12/11/14 1932 12/12/14 0425 12/13/14 0351 12/13/14 1123  NA 150* 145 140 147* 149* 147* 151*  K 4.0 3.1* 4.1 3.4* 3.3* 3.3* 3.2*  CL 117* 103 108 102 114* 115* 105  CO2 29 33* 28  --  30 28  --   GLUCOSE 150* 141* 139* 253* 179* 141* 164*  BUN 37* 26* 33* 38* 33* 27* 27*  CREATININE 0.87 0.70 0.84 0.90 0.82 0.80 0.90  CALCIUM 9.1 9.6 8.9  --  9.7 9.7  --    Liver Function Tests: No results for input(s): AST, ALT, ALKPHOS, BILITOT, PROT, ALBUMIN in the last 168 hours. No results for input(s): LIPASE, AMYLASE in the last 168 hours. No results for input(s): AMMONIA in the last 168 hours. CBC:  Recent Labs Lab 12/07/14 0400  12/08/14 0352 12/09/14 0400 12/11/14 1932 12/12/14 0425 12/13/14 1101 12/13/14 1123  WBC 18.4*  --  15.6* 16.6*  --  24.2* 22.8*  --   NEUTROABS 15.7*  --   --   --   --   --   --   --   HGB 13.2  < > 14.2 15.9 16.0 15.5 16.2 17.0  HCT 38.9*  < > 42.5 46.4 47.0 45.3 47.4 50.0  MCV 93.1  --  95.1 93.4  --  91.5 93.7  --   PLT 161  --  109* 115*  --  115* 160  --   < > = values in this interval not displayed. Cardiac Enzymes: No results for input(s): CKTOTAL, CKMB, CKMBINDEX, TROPONINI in the last 168 hours. BNP (last 3 results) No results for input(s): PROBNP in the last 8760 hours. CBG:  Recent Labs Lab 12/12/14 1646 12/12/14 1958 12/12/14 2327 12/13/14 0356 12/13/14 0738  GLUCAP 200* 180* 155* 132* 126*    Recent Results (from the past 240 hour(s))  MRSA PCR Screening     Status: None   Collection Time: 12/05/14  6:01 PM  Result Value Ref Range Status   MRSA by PCR NEGATIVE NEGATIVE Final    Comment:        The GeneXpert MRSA Assay (FDA approved for NASAL specimens only), is one component of a comprehensive MRSA colonization surveillance  program. It is not intended to diagnose MRSA infection nor to guide or monitor treatment for MRSA infections.   Culture, respiratory (NON-Expectorated)     Status: None   Collection Time: 12/06/14  3:55 AM  Result Value Ref Range Status   Specimen Description TRACHEAL ASPIRATE  Final   Special Requests Immunocompromised  Final   Gram Stain   Final    MODERATE WBC PRESENT, PREDOMINANTLY PMN FEW SQUAMOUS EPITHELIAL CELLS PRESENT MODERATE GRAM POSITIVE COCCI IN PAIRS IN CHAINS Performed at [facility]    Culture   Final    Non-Pathogenic Oropharyngeal-type Flora Isolated. Performed at [facility]    Report Status 12/08/2014 FINAL  Final  Clostridium Difficile by PCR     Status: Abnormal   Collection Time: 12/11/14  7:58 AM  Result Value Ref Range Status   C difficile by pcr POSITIVE (A) NEGATIVE Final    Comment: CRITICAL RESULT CALLED TO, READ BACK BY AND VERIFIED WITH: Burt Ek RN 11:00 12/11/14 (  [email or web address])      Studies: No results found.  Scheduled Meds: . amiodarone  100 mg Per Tube Daily  . amLODipine  5 mg Per Tube Daily  . anticoagulant sodium citrate  5 mL Intracatheter Once  . antiseptic oral rinse  7 mL Mouth Rinse QID  . calcium carbonate  2 tablet Oral Q3H  . calcium gluconate IVPB  4 g Intravenous Once  . chlorhexidine  15 mL Mouth Rinse BID  . citrate dextrose      . free water  200 mL Per Tube Q4H  . insulin aspart  0-20 Units Subcutaneous 6 times per day  . metroNIDAZOLE  500 mg Oral 3 times per day  . pantoprazole sodium  40 mg Per Tube Daily  . potassium chloride  40 mEq Oral Once  . predniSONE  60 mg Oral Q breakfast  . pyridostigmine  60 mg Per NG tube QID  . rivaroxaban  20 mg Oral Q supper  . sodium chloride  10-40 mL Intracatheter Q12H   Continuous Infusions: . sodium chloride 10 mL/hr at 12/12/14 1000  . citrate dextrose    . citrate dextrose    . feeding supplement (VITAL AF 1.2 CAL) 1,000 mL (12/13/14 0754)     Time  spent: 35 minutes    Stephanos Fan, [facility] Pager [phone] If 7PM-7AM, please contact night-coverage at [email or web address], password [facility] 12/13/2014, 1:34 PM  LOS: 8 days

## 2014-12-13 NOTE — Progress Notes (Signed)
Subjective: Much improved.   Exam: Filed Vitals:   12/13/14 1325  BP: 101/33  Pulse: 81  Temp: 97.2 F (36.2 C)  Resp: 16   Gen: In bed, NAD MS: Awake, alert, interactive and appropriate OI:ZTIWP, he has improved EOM, but still restricted in the right eye.  Motor: 3/5 neck flexion, weakness of bilateral arm raising but improved compared to previous days. Good strength in his legs.  Sensory:intact to LT  Impression: 74 yo M with MG and improvement following PLEX. Continues to improve. His last exchange will be today.   Recommendations: 1) Will continue to monitor as improvement can continue following PLEX.  2) continue mestinon.   Roland Rack, MD [facility] [phone]  If 7pm- 7am, please page neurology on call as listed in [place].

## 2014-12-14 DIAGNOSIS — E870 Hyperosmolality and hypernatremia: Secondary | ICD-10-CM

## 2014-12-14 LAB — GLUCOSE, CAPILLARY
GLUCOSE-CAPILLARY: 140 mg/dL — AB (ref 70–99)
GLUCOSE-CAPILLARY: 198 mg/dL — AB (ref 70–99)
GLUCOSE-CAPILLARY: 250 mg/dL — AB (ref 70–99)
Glucose-Capillary: 137 mg/dL — ABNORMAL HIGH (ref 70–99)
Glucose-Capillary: 167 mg/dL — ABNORMAL HIGH (ref 70–99)
Glucose-Capillary: 206 mg/dL — ABNORMAL HIGH (ref 70–99)

## 2014-12-14 LAB — BASIC METABOLIC PANEL
Anion gap: 6 (ref 5–15)
BUN: 29 mg/dL — ABNORMAL HIGH (ref 6–23)
CALCIUM: 8.6 mg/dL (ref 8.4–10.5)
CHLORIDE: 115 mmol/L — AB (ref 96–112)
CO2: 25 mmol/L (ref 19–32)
CREATININE: 0.88 mg/dL (ref 0.50–1.35)
GFR, EST NON AFRICAN AMERICAN: 83 mL/min — AB (ref >90)
GLUCOSE: 201 mg/dL — AB (ref 70–99)
POTASSIUM: 3.7 mmol/L (ref 3.5–5.1)
Sodium: 146 mmol/L — ABNORMAL HIGH (ref 135–145)

## 2014-12-14 MED ORDER — FAMOTIDINE 40 MG/5ML PO SUSR
20.0000 mg | Freq: Every day | ORAL | Status: DC
  Administered 2014-12-14 – 2014-12-17 (×4): 20 mg
  Filled 2014-12-14 (×4): qty 2.5

## 2014-12-14 MED ORDER — PYRIDOSTIGMINE BROMIDE 60 MG PO TABS
90.0000 mg | ORAL_TABLET | Freq: Four times a day (QID) | ORAL | Status: DC
  Administered 2014-12-14 – 2014-12-17 (×12): 90 mg via NASOGASTRIC
  Filled 2014-12-14 (×14): qty 1.5

## 2014-12-14 NOTE — Progress Notes (Signed)
NIF: -20, VC-.80

## 2014-12-14 NOTE — Progress Notes (Addendum)
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 fib on chronic anticoagulation who was transferred from [facility] with progressive weakness for the past 1 month . Patient was started on 60 mg oral prednisone in addition to Mestinon therapy by his neurologist despite which his symptoms continue to worsen with difficulty swallowing and progressive shortness of breath. Patient was transferred to [facility] [place] and admitted to  ICU for acute  respiratory failure secondary to acute Myasthenia crisis requiring intubation on 12/05/2014. Started on plasmapheresis on admission. He was successfully extubated on 1/25 followed by when necessary BiPAP. Since then he has been requiring intermittent NPPV and clinically improved. Patient has residual dysphagia, speech impairment, right eye ptosis and proximal upper extremity weakness .Patient started on Mestinon on 1/28 along with prednisone. Neurology closely following. Slow improvement in NIF.Transferred to telemetry.      Assessment/Plan: Acute exacerbation of myasthenia gravis Patient received daily plasmapheresis until 1/30.Marland Kitchen Continue steroid and Mestinon. Continue PT eval. D/c to CIR  once medically stable, hopefully in the next few days. -rt eye ptosis EOM movement have much improved. Proximal muscle strenght slowly improving. Speech slightly better today.  Continue close monitoring of VC and NIF.  Dysphagia Secondary to myasthenia gravis flareup with bulbar symptoms Currently receiving NG feeds. Continue PPI Speech and swallow following regularly .  C. difficile diarrhea Diarrhea resolved.  On empiric Flagyl with plan on 14 day course.( since 1/28--)  Leukocytosis Likely related to steroid and  C. difficile.  Monitor.  Hypokalemia Replenished  Hypernatremia  possibly from dehydration. Adjust tube feeds and monitor.    Type 2 diabetes mellitus Stable. Holding home medication. Continue sliding scale insulin   Metastatic bladder cancer.  follows with oncologist in [place].  Last chemo in October 2015  Atrial fibrillation On Xarelto at home which has been resumed Continue amiodarone    Hypertension Blood pressure stable. Continue amlodipine.   Diet: Tube feeds DVT prophylaxis: Xarelto  Code Status: Full code   Family Communication: none at bedside  Disposition Plan: currently inpt.  inpt rehab upon discharge.   Consultants:  PCCM  Neurology   renal  Procedures:  intubation on 1/22  plasmapharesis completed on 1/30  Antibiotics:  Flagyl 1/28--  HPI/Subjective: Patient seen and examined. Feels tired. No further diarrhea. Denies any pain or SOB  Objective: Filed Vitals:   12/14/14 0604  BP: 108/65  Pulse: 98  Temp: 97.8 F (36.6 C)  Resp: 19    Intake/Output Summary (Last 24 hours) at 12/14/14 0906 Last data filed at 12/14/14 2426  Gross per 24 hour  Intake 1957.34 ml  Output   1700 ml  Net 257.34 ml   Filed Weights   12/13/14 1115 12/13/14 1325 12/14/14 0604  Weight: 105.8 kg (233 lb 4 oz) 106.2 kg (234 lb 2.1 oz) 106.1 kg (233 lb 14.5 oz)    Exam:   General:  Elderly male lying in bed appears fatigued  HEENT: NG in place, right eye ptosis resolved. , moist oral mucosa, rt IJ for plasmapheresis.  Chest: Clear to auscultation bilaterally, no added sounds, left sided port A cath  CVS: Normal S1 and S2, no murmurs rub or gallop  Abdomen: Soft, nondistended, nontender, bowel sounds present  Extremities: Warm, no edema  CNS: Alert and oriented, good movement of EOM,  slurred speech,( improved from yesterday) unable to move arms above the shoulder but able to keep it in position when elevated, good strength in bilateral lower extremities  and normal sensations.  Data Reviewed: Basic Metabolic Panel:  Recent Labs Lab 12/09/14 0400 12/10/14 0359 12/11/14 1932 12/12/14 0425 12/13/14 0351 12/13/14 1123 12/14/14 0500  NA 145 140 147* 149* 147* 151* 146*  K 3.1* 4.1 3.4* 3.3* 3.3* 3.2* 3.7  CL 103 108 102 114* 115* 105 115*  CO2 33* 28  --  30 28  --  25  GLUCOSE 141* 139* 253* 179* 141* 164* 201*  BUN 26* 33* 38* 33* 27* 27* 29*  CREATININE 0.70 0.84 0.90 0.82 0.80 0.90 0.88  CALCIUM 9.6 8.9  --  9.7 9.7  --  8.6   Liver Function Tests: No results for input(s): AST, ALT, ALKPHOS, BILITOT, PROT, ALBUMIN in the last 168 hours. No results for input(s): LIPASE, AMYLASE in the last 168 hours. No results for input(s): AMMONIA in the last 168 hours. CBC:  Recent Labs Lab 12/08/14 0352 12/09/14 0400 12/11/14 1932 12/12/14 0425 12/13/14 1101 12/13/14 1123  WBC 15.6* 16.6*  --  24.2* 22.8*  --   HGB 14.2 15.9 16.0 15.5 16.2 17.0  HCT 42.5 46.4 47.0 45.3 47.4 50.0  MCV 95.1 93.4  --  91.5 93.7  --   PLT 109* 115*  --  115* 160  --    Cardiac Enzymes: No results for input(s): CKTOTAL, CKMB, CKMBINDEX, TROPONINI in the last 168 hours. BNP (last 3 results) No results for input(s): PROBNP in the last 8760 hours. CBG:  Recent Labs Lab 12/13/14 1607 12/13/14 2000 12/14/14 0029 12/14/14 0440 12/14/14 0748  GLUCAP 267* 231* 140* 137* 167*    Recent Results (from the past 240 hour(s))  MRSA PCR Screening     Status: None   Collection Time: 12/05/14  6:01 PM  Result Value Ref Range Status   MRSA by PCR NEGATIVE NEGATIVE Final    Comment:        The GeneXpert MRSA Assay (FDA approved for NASAL specimens only), is one component of a comprehensive MRSA colonization surveillance program. It is not intended to diagnose MRSA infection nor to guide or monitor treatment for MRSA infections.   Culture, respiratory (NON-Expectorated)     Status: None   Collection Time: 12/06/14  3:55 AM  Result Value Ref Range  Status   Specimen Description TRACHEAL ASPIRATE  Final   Special Requests Immunocompromised  Final   Gram Stain   Final    MODERATE WBC PRESENT, PREDOMINANTLY PMN FEW SQUAMOUS EPITHELIAL CELLS PRESENT MODERATE GRAM POSITIVE COCCI IN PAIRS IN CHAINS Performed at [facility]    Culture   Final    Non-Pathogenic Oropharyngeal-type Flora Isolated. Performed at [facility]    Report Status 12/08/2014 FINAL  Final  Clostridium Difficile by PCR     Status: Abnormal   Collection Time: 12/11/14  7:58 AM  Result Value Ref Range Status   C difficile by pcr POSITIVE (A) NEGATIVE Final    Comment: CRITICAL RESULT CALLED TO, READ BACK BY AND VERIFIED WITH: S. MARSH RN 11:00 12/11/14 ([email or web address])      Studies: No results found.  Scheduled Meds: . amiodarone  100 mg Per Tube Daily  . amLODipine  5 mg Per Tube Daily  . antiseptic oral rinse  7 mL Mouth Rinse QID  . chlorhexidine  15 mL Mouth Rinse BID  . free water  200  mL Per Tube Q4H  . insulin aspart  0-20 Units Subcutaneous 6 times per day  . metroNIDAZOLE  500 mg Oral 3 times per day  . pantoprazole sodium  40 mg Per Tube Daily  . predniSONE  60 mg Oral Q breakfast  . pyridostigmine  60 mg Per NG tube QID  . rivaroxaban  20 mg Oral Q supper  . sodium chloride  10-40 mL Intracatheter Q12H   Continuous Infusions: . sodium chloride 10 mL/hr at 12/12/14 1000  . citrate dextrose    . feeding supplement (VITAL AF 1.2 CAL) 1,000 mL (12/14/14 7829)     Time spent: 35 minutes    Craig [name], [facility] Pager [phone] If 7PM-7AM, please contact night-coverage at [email or web address], password [facility] 12/14/2014, 9:06 AM  LOS: 9 days

## 2014-12-14 NOTE — Progress Notes (Signed)
1L VC and -20 NIF

## 2014-12-15 ENCOUNTER — Other Ambulatory Visit: Payer: Self-pay

## 2014-12-15 DIAGNOSIS — D696 Thrombocytopenia, unspecified: Secondary | ICD-10-CM

## 2014-12-15 DIAGNOSIS — A0472 Enterocolitis due to Clostridium difficile, not specified as recurrent: Secondary | ICD-10-CM | POA: Diagnosis not present

## 2014-12-15 LAB — CBC
HEMATOCRIT: 40.1 % (ref 39.0–52.0)
Hemoglobin: 14 g/dL (ref 13.0–17.0)
MCH: 31.9 pg (ref 26.0–34.0)
MCHC: 34.9 g/dL (ref 30.0–36.0)
MCV: 91.3 fL (ref 78.0–100.0)
Platelets: 112 10*3/uL — ABNORMAL LOW (ref 150–400)
RBC: 4.39 MIL/uL (ref 4.22–5.81)
RDW: 13.8 % (ref 11.5–15.5)
WBC: 20.7 10*3/uL — ABNORMAL HIGH (ref 4.0–10.5)

## 2014-12-15 LAB — GLUCOSE, CAPILLARY
GLUCOSE-CAPILLARY: 261 mg/dL — AB (ref 70–99)
GLUCOSE-CAPILLARY: 139 mg/dL — AB (ref 70–99)
Glucose-Capillary: 119 mg/dL — ABNORMAL HIGH (ref 70–99)
Glucose-Capillary: 138 mg/dL — ABNORMAL HIGH (ref 70–99)
Glucose-Capillary: 146 mg/dL — ABNORMAL HIGH (ref 70–99)
Glucose-Capillary: 180 mg/dL — ABNORMAL HIGH (ref 70–99)
Glucose-Capillary: 216 mg/dL — ABNORMAL HIGH (ref 70–99)

## 2014-12-15 MED ORDER — CALCIUM CARBONATE ANTACID 500 MG PO CHEW: 2.0000 | CHEWABLE_TABLET | ORAL | Status: AC

## 2014-12-15 MED ORDER — SODIUM CHLORIDE 0.9 % IV SOLN
INTRAVENOUS | Status: AC
  Administered 2014-12-16 (×3): via INTRAVENOUS_CENTRAL
  Filled 2014-12-15 (×3): qty 200

## 2014-12-15 MED ORDER — DIPHENHYDRAMINE HCL 25 MG PO CAPS: 25.0000 mg | ORAL_CAPSULE | Freq: Four times a day (QID) | ORAL | Status: DC | PRN

## 2014-12-15 MED ORDER — ACETAMINOPHEN 325 MG PO TABS: 650.0000 mg | ORAL_TABLET | ORAL | Status: DC | PRN

## 2014-12-15 MED ORDER — ACD FORMULA A 0.73-2.45-2.2 GM/100ML VI SOLN
500.0000 mL | Status: DC
  Administered 2014-12-16: 500 mL via INTRAVENOUS
  Filled 2014-12-15: qty 500

## 2014-12-15 MED ORDER — CALCIUM CARBONATE ANTACID 500 MG PO CHEW
CHEWABLE_TABLET | ORAL | Status: AC
  Filled 2014-12-15: qty 4

## 2014-12-15 MED ORDER — HEPARIN SODIUM (PORCINE) 1000 UNIT/ML IJ SOLN
1000.0000 [IU] | Freq: Once | INTRAMUSCULAR | Status: DC
  Filled 2014-12-15: qty 1

## 2014-12-15 MED ORDER — SODIUM CHLORIDE 0.9 % IV SOLN
4.0000 g | Freq: Once | INTRAVENOUS | Status: AC
  Administered 2014-12-16: 4 g via INTRAVENOUS
  Filled 2014-12-15: qty 40

## 2014-12-15 MED ORDER — ACD FORMULA A 0.73-2.45-2.2 GM/100ML VI SOLN
Status: AC
  Administered 2014-12-16: 500 mL via INTRAVENOUS
  Filled 2014-12-15: qty 1000

## 2014-12-15 NOTE — Progress Notes (Signed)
Inpatient Diabetes Program Recommendations  AACE/ADA: New Consensus Statement on Inpatient Glycemic Control (2013)  Target Ranges:  Prepandial:   less than 140 mg/dL      Peak postprandial:   less than 180 mg/dL (1-2 hours)      Critically ill patients:  140 - 180 mg/dL   Results for Craig [name], Craig [name] (MRN [id number]) as of 12/15/2014 13:19  Ref. Range 12/14/2014 07:48 12/14/2014 12:14 12/14/2014 15:55 12/14/2014 19:38 12/15/2014 00:08 12/15/2014 04:24 12/15/2014 08:00 12/15/2014 12:14  Glucose-Capillary Latest Range: 70-99 mg/dL 167 (H) 206 (H) 250 (H) 198 (H) 138 (H) 146 (H) 119 (H) 216 (H)    Diabetes history: DM2 Outpatient Diabetes medications: Starlix 120 mg tid, Metformin 500 mg bid Current orders for Inpatient glycemic control: Novolog 0-20 units (Resistant scale) Q4 hrs  Inpatient Diabetes Program Recommendations Correction (SSI): Please consider reducing correction scale to Novolog 0-15 units (Moderate scale) Q4 hrs with tube feeds. Insulin - Tube Feed Coverage:  While patient is receiving steroids. Please consider ordering Novolog 3 units Q4hrs for tube feed coverage in addition to the correction scale (Hold tube feed coverage if tube feeds are held for any reason).  Thanks,  Tama Headings RN, MSN, [facility] Inpatient Diabetes Coordinator Team Pager [phone]

## 2014-12-15 NOTE — Progress Notes (Addendum)
NEURO HOSPITALIST PROGRESS NOTE   SUBJECTIVE:                                                                                                                        Looks comfortable in bed but said that swallowing is still a problem, and gets short of breath easily when taking. Completed PLEX 5 days. NIF -20, VC 1.1L  Tolerating well mestinon 90 mg 4 times daily.  Prednisone 60 mg qd.  OBJECTIVE:                                                                                                                           Vital signs in last 24 hours: Temp:  [97.6 F (36.4 C)-97.9 F (36.6 C)] 97.9 F (36.6 C) (02/01 0518) Pulse Rate:  [79-88] 79 (02/01 0518) Resp:  [19-20] 19 (02/01 0518) BP: (110-126)/(58-67) 124/58 mmHg (02/01 0518) SpO2:  [96 %] 96 % (02/01 0518) Weight:  [108.9 kg (240 lb 1.3 oz)] 108.9 kg (240 lb 1.3 oz) (02/01 0518)  Intake/Output from previous day: 01/31 0701 - 02/01 0700 In: 8315 [P.O.:120; I.V.:10; NG/GT:1650] Out: 1500 [Urine:1500] Intake/Output this shift: Total I/O In: -  Out: 200 [Urine:200] Nutritional status:    Past Medical History  Diagnosis Date  . Atrial fibrillation, chronic     on Xarelto   . HTN (hypertension)   . CHF (congestive heart failure)   . Diabetes mellitus   . Bladder cancer metastasized to intra-abdominal lymph nodes     abdominal wall metastases     Physical exam: pleasant male in no apparent distress. Head: normocephalic. Neck: supple, no bruits, no JVD. Cardiac: no murmurs. Lungs: clear. Abdomen: soft, no tender, no mass. Extremities: no edema. Skin: no rash  Neurologic Exam:  General: Mental Status: Alert, oriented, thought content appropriate.  Speech fluent without evidence of aphasia.  Able to follow 3 step commands without difficulty. Cranial Nerves: II: Discs flat bilaterally; Visual fields grossly normal, pupils equal, round, reactive to light and  accommodation III,IV, VI: right  Eyelid ptosis, EOM impaired right eye. V,VII: smile symmetric, facial light touch sensation normal bilaterally VIII: hearing normal bilaterally IX,X: gag reflex present XI: bilateral shoulder shrug XII: midline tongue extension without atrophy or  fasciculations Motor: Neck flexors 3/5. Biceps 3/5, deltoids and triceps 4/5. Marland KitchenGood strength in his legs.  Sensory: intact to LT Deep Tendon Reflexes:  1+ ALL OVER Plantars: Right: downgoing   Left: downgoing Cerebellar: normal finger-to-nose,  heel-to-shin no tested Gait:  Unable to test   Lab Results: No results found for: CHOL Lipid Panel No results for input(s): CHOL, TRIG, HDL, CHOLHDL, VLDL, LDLCALC in the last 72 hours.  Studies/Results: No results found.  MEDICATIONS                                                                                                                        Scheduled: . amiodarone  100 mg Per Tube Daily  . amLODipine  5 mg Per Tube Daily  . antiseptic oral rinse  7 mL Mouth Rinse QID  . chlorhexidine  15 mL Mouth Rinse BID  . famotidine  20 mg Per Tube Daily  . free water  200 mL Per Tube Q4H  . insulin aspart  0-20 Units Subcutaneous 6 times per day  . metroNIDAZOLE  500 mg Oral 3 times per day  . predniSONE  60 mg Oral Q breakfast  . pyridostigmine  90 mg Per NG tube QID  . rivaroxaban  20 mg Oral Q supper  . sodium chloride  10-40 mL Intracatheter Q12H    ASSESSMENT/PLAN:                                                                                                           74 y/o with MG exacerbation s/p 5 days PLEX. Suboptimal improvement. Will consider one more day PLEX or IVIG x 3 days. Continue mestinon and prednisone. Will follow up.  Dorian Pod, MD [facility] [phone]  12/15/2014, 9:23 AM

## 2014-12-15 NOTE — Progress Notes (Signed)
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 fib on chronic anticoagulation who was transferred from [facility] with progressive weakness for the past 1 month . Patient was started on 60 mg oral prednisone in addition to Mestinon therapy by his neurologist despite which his symptoms continue to worsen with difficulty swallowing and progressive shortness of breath. Patient was transferred to [facility] and admitted to  ICU for acute  respiratory failure secondary to acute Myasthenia crisis requiring intubation on 12/05/2014. Started on plasmapheresis on admission. He was successfully extubated on 1/25 followed by when necessary BiPAP. Since then he has been requiring intermittent NPPV and clinically improved. Patient has residual dysphagia, speech impairment, right eye ptosis and proximal upper extremity weakness .Patient started on Mestinon on 1/28 along with prednisone. Neurology closely following. Slow improvement in NIF.Transferred to medical floor.      Assessment/Plan: Acute exacerbation of myasthenia gravis Patient received daily plasmapheresis until 1/30.Marland Kitchen Continue steroid and Mestinon. Neurology following. Continue PT eval. D/c to CIR  once medically stable, hopefully in the next few days. -rt eye ptosis EOM movement have much improved. Proximal muscle strenght slowly improving. Speech impairment persists.  Continue close monitoring of VC and NIF.( improving NIF -20 and VC 1.1 L today)   Dysphagia Secondary to myasthenia gravis flareup with bulbar symptoms Currently receiving NG feeds. Continue PPI Speech and swallow following regularly .  C. difficile diarrhea Diarrhea resolved.  On empiric Flagyl with plan on 14 day course.( since  1/28--)  Leukocytosis Likely related to steroid and  C. difficile. Improving.   Hypokalemia Replenished  Hypernatremia  possibly from dehydration. Adjust tube feeds and monitor.    Type 2 diabetes mellitus Stable. Holding home medication. Continue sliding scale insulin   Metastatic bladder cancer.  follows with oncologist in [place].  Last chemo in October 2015  Atrial fibrillation On Xarelto at home which has been resumed Continue amiodarone    Hypertension Blood pressure stable. Continue amlodipine.   Thrombocytopenia  mild. ?Possibly due to acute illness. Monitor.   Diet: Tube feeds  DVT prophylaxis: Xarelto  Code Status: Full code   Family Communication: wife at bedside  Disposition Plan: currently inpt.  inpt rehab upon discharge. Possibly  over next few days   Consultants:  PCCM  Neurology   renal  Procedures:  intubation on 1/22  plasmapharesis completed on 1/30  Antibiotics:  Flagyl 1/28--  HPI/Subjective: Patient seen and examined. Feels somewhat better. . No further diarrhea. Denies any pain or SOB  Objective: Filed Vitals:   12/15/14 0518  BP: 124/58  Pulse: 79  Temp: 97.9 F (36.6 C)  Resp: 19    Intake/Output Summary (Last 24 hours) at 12/15/14 0850 Last data filed at 12/15/14 4193  Gross per 24 hour  Intake   1780 ml  Output   1700 ml  Net     80 ml   Filed Weights   12/13/14 1325 12/14/14 0604 12/15/14 0518  Weight: 106.2 kg (234 lb 2.1 oz) 106.1 kg (233 lb 14.5 oz) 108.9 kg (240 lb 1.3 oz)    Exam:   General:  Elderly male lying in bed appears fatigued  HEENT: NG in place, mild   right eye ptosis . , moist oral mucosa, rt IJ for plasmapheresis.  Chest: Clear to auscultation bilaterally,  no added sounds, left sided port A cath  CVS: Normal S1 and S2, no murmurs rub or gallop  Abdomen: Soft, nondistended, nontender, bowel sounds present  Extremities: Warm, no edema  CNS: Alert and oriented, good  movement of EOM, slurred speech,( unchanged) unable to move arms above the shoulder but able to keep it in position when elevated, good strength in bilateral lower extremities and normal sensations.  Data Reviewed: Basic Metabolic Panel:  Recent Labs Lab 12/09/14 0400 12/10/14 0359 12/11/14 1932 12/12/14 0425 12/13/14 0351 12/13/14 1123 12/14/14 0500  NA 145 140 147* 149* 147* 151* 146*  K 3.1* 4.1 3.4* 3.3* 3.3* 3.2* 3.7  CL 103 108 102 114* 115* 105 115*  CO2 33* 28  --  30 28  --  25  GLUCOSE 141* 139* 253* 179* 141* 164* 201*  BUN 26* 33* 38* 33* 27* 27* 29*  CREATININE 0.70 0.84 0.90 0.82 0.80 0.90 0.88  CALCIUM 9.6 8.9  --  9.7 9.7  --  8.6   Liver Function Tests: No results for input(s): AST, ALT, ALKPHOS, BILITOT, PROT, ALBUMIN in the last 168 hours. No results for input(s): LIPASE, AMYLASE in the last 168 hours. No results for input(s): AMMONIA in the last 168 hours. CBC:  Recent Labs Lab 12/09/14 0400 12/11/14 1932 12/12/14 0425 12/13/14 1101 12/13/14 1123 12/15/14 0428  WBC 16.6*  --  24.2* 22.8*  --  20.7*  HGB 15.9 16.0 15.5 16.2 17.0 14.0  HCT 46.4 47.0 45.3 47.4 50.0 40.1  MCV 93.4  --  91.5 93.7  --  91.3  PLT 115*  --  115* 160  --  112*   Cardiac Enzymes: No results for input(s): CKTOTAL, CKMB, CKMBINDEX, TROPONINI in the last 168 hours. BNP (last 3 results) No results for input(s): PROBNP in the last 8760 hours. CBG:  Recent Labs Lab 12/14/14 1555 12/14/14 1938 12/15/14 0008 12/15/14 0424 12/15/14 0800  GLUCAP 250* 198* 138* 146* 119*    Recent Results (from the past 240 hour(s))  MRSA PCR Screening     Status: None   Collection Time: 12/05/14  6:01 PM  Result Value Ref Range Status   MRSA by PCR NEGATIVE NEGATIVE Final    Comment:        The GeneXpert MRSA Assay (FDA approved for NASAL specimens only), is one component of a comprehensive MRSA colonization surveillance program. It is not intended to diagnose MRSA infection nor  to guide or monitor treatment for MRSA infections.   Culture, respiratory (NON-Expectorated)     Status: None   Collection Time: 12/06/14  3:55 AM  Result Value Ref Range Status   Specimen Description TRACHEAL ASPIRATE  Final   Special Requests Immunocompromised  Final   Gram Stain   Final    MODERATE WBC PRESENT, PREDOMINANTLY PMN FEW SQUAMOUS EPITHELIAL CELLS PRESENT MODERATE GRAM POSITIVE COCCI IN PAIRS IN CHAINS Performed at [facility]    Culture   Final    Non-Pathogenic Oropharyngeal-type Flora Isolated. Performed at [facility]    Report Status 12/08/2014 FINAL  Final  Clostridium Difficile by PCR     Status: Abnormal   Collection Time: 12/11/14  7:58 AM  Result Value Ref Range Status   C difficile by pcr POSITIVE (A) NEGATIVE Final    Comment: CRITICAL RESULT CALLED TO, READ BACK BY AND VERIFIED WITH: S. MARSH RN 11:00 12/11/14 ([email or web address])      Studies: No results found.  Scheduled Meds: . amiodarone  100  mg Per Tube Daily  . amLODipine  5 mg Per Tube Daily  . antiseptic oral rinse  7 mL Mouth Rinse QID  . chlorhexidine  15 mL Mouth Rinse BID  . famotidine  20 mg Per Tube Daily  . free water  200 mL Per Tube Q4H  . insulin aspart  0-20 Units Subcutaneous 6 times per day  . metroNIDAZOLE  500 mg Oral 3 times per day  . predniSONE  60 mg Oral Q breakfast  . pyridostigmine  90 mg Per NG tube QID  . rivaroxaban  20 mg Oral Q supper  . sodium chloride  10-40 mL Intracatheter Q12H   Continuous Infusions: . sodium chloride 10 mL/hr at 12/12/14 1000  . citrate dextrose    . feeding supplement (VITAL AF 1.2 CAL) 1,000 mL (12/15/14 0600)     Time spent: 35 minutes    Aragon Scarantino, [place]  [facility] Pager [phone] If 7PM-7AM, please contact night-coverage at [email or web address], password [facility] 12/15/2014, 8:50 AM  LOS: 10 days

## 2014-12-15 NOTE — Progress Notes (Signed)
NIF -20, VC 1.1L Pt shows good effort

## 2014-12-15 NOTE — Progress Notes (Signed)
NIF -22 VC .8L

## 2014-12-15 NOTE — Progress Notes (Signed)
Physical Therapy Treatment Patient Details Name: Craig [name] MRN: [id number] DOB: [birth year] Today's Date: 12/15/2014    History of Present Illness [name] is a 74 y.o. 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 Patient having progressive weakness over the past month becoming worse in past week. Pt with acute MG exacerbation & respiratory failure. Decompensated requiring intubation 1/22 pm, extubated 12/08/14, currently on bipap PRN. Pt now with C-diff.    PT Comments    Patient progressing well with mobility. Improved ambulation distance today requiring 1 seated rest break due to dyspnea and fatigue. Sa02 dropped to 88% on 3L 02 [place]. Education provided on pursed lip breathing. Encouraged pt to use spirometer daily. Continues to be appropriate for CIR. Speech seems to be improving however still difficult to understand at times.    Follow Up Recommendations  CIR     Equipment Recommendations  Other (comment)    Recommendations for Other Services       Precautions / Restrictions Precautions Precautions: Fall Restrictions Weight Bearing Restrictions: No    Mobility  Bed Mobility               General bed mobility comments: Received sitting in chair upon PT arrival.  Transfers Overall transfer level: Needs assistance Equipment used: Rolling walker (2 wheeled) Transfers: Sit to/from Stand Sit to Stand: Min assist         General transfer comment: Min A to rise from chair. Stood from chair x4.   Ambulation/Gait Ambulation/Gait assistance: Min guard;+2 safety/equipment Ambulation Distance (Feet): 125 Feet (x2 bouts) Assistive device: Rolling walker (2 wheeled) Gait Pattern/deviations: Step-through pattern;Wide base of support;Trunk flexed;Decreased step length  - left;Decreased step length - right     General Gait Details: VC's for upright posture and RW proximity. Dyspnea and fatigue present requiring 1 seated rest break. Sa02 88% on 3L 02 [place]. Cues for pursed lip breathing.    Stairs            Wheelchair Mobility    Modified Rankin (Stroke Patients Only)       Balance Overall balance assessment: Needs assistance Sitting-balance support: Feet supported;No upper extremity supported Sitting balance-Leahy Scale: Fair Sitting balance - Comments: Trunk musculature fatigues quickly requiring need for back support.    Standing balance support: During functional activity Standing balance-Leahy Scale: Poor                      Cognition Arousal/Alertness: Awake/alert Behavior During Therapy: WFL for tasks assessed/performed Overall Cognitive Status: Within Functional Limits for tasks assessed                      Exercises      General Comments General comments (skin integrity, edema, etc.): Pt possibly to start another round of plasmaphoresis today??      Pertinent Vitals/Pain Pain Assessment: No/denies pain    Home Living                      Prior Function            PT Goals (current goals can now be found in the care plan section) Progress towards PT goals: Progressing toward goals    Frequency  Min 3X/week    PT Plan Current plan remains appropriate    Co-evaluation  End of Session Equipment Utilized During Treatment: Gait belt;Oxygen Activity Tolerance: Patient tolerated treatment well Patient left: in chair;with call bell/phone within reach;with family/visitor present     Time: 1356-1430 PT Time Calculation (min) (ACUTE ONLY): 34 min  Charges:  $Gait Training: 23-37 mins                    G CodesCandy Sledge A 25-Dec-2014, 3:00 PM  Candy Sledge, [place], DPT [phone]

## 2014-12-15 NOTE — Progress Notes (Signed)
Speech Language Pathology Treatment: Dysphagia  Patient Details Name: Craig [name] MRN: [id number] DOB: Jan 07, 1941 Today's Date: 12/15/2014 Time: 4098-1191 SLP Time Calculation (min) (ACUTE ONLY): 28 min  Assessment / Plan / Recommendation Clinical Impression  Pt continues to show slow progress despite reporting that he feels "weaker" today than he did yesterday. SLP provided Total A for oral care prior to administration of ice chip trials. Pt with intermittent throat clearing and coughing post-swallow as well as c/o difficulty clearing ice chips from pharynx, although this only occurred on approximately 40% of trials. With Mod A from SLP, pt was able to cough and clear moderate amounts of thick secretions which were then orally suctioned. After 8 trials, pt reported that he was fatigued and declined further PO. Recommend to maintain NPO with nutrition and medications via NGT with continued swallowing therapy with SLP.   HPI HPI: [name], never smoker, transferred to [facility] on 1/22 with acute MG exacerbation & respiratory failure. Decompensated requiring intubation 1/22-1/25.   Pertinent Vitals Pain Assessment: No/denies pain  SLP Plan  Continue with current plan of care    Recommendations Diet recommendations: NPO Medication Administration: Via alternative means       Oral Care Recommendations: Oral care Q4 per protocol Follow up Recommendations: Inpatient Rehab Plan: Continue with current plan of care    GO     Germain Osgood, M.A. CCC-SLP [phone]  Germain Osgood 12/15/2014, 2:05 PM

## 2014-12-15 NOTE — Progress Notes (Signed)
Noted pt for another PLEX today. I have insurance approval for an inpt rehab admission when pt medically ready. Pt is aware. Wife not present during my visit. [phone]

## 2014-12-16 DIAGNOSIS — R1310 Dysphagia, unspecified: Secondary | ICD-10-CM

## 2014-12-16 DIAGNOSIS — E876 Hypokalemia: Secondary | ICD-10-CM

## 2014-12-16 LAB — POCT I-STAT, CHEM 8
BUN: 18 mg/dL (ref 6–23)
Calcium, Ion: 1.13 mmol/L (ref 1.13–1.30)
Chloride: 102 mmol/L (ref 96–112)
Creatinine, Ser: 0.7 mg/dL (ref 0.50–1.35)
Glucose, Bld: 134 mg/dL — ABNORMAL HIGH (ref 70–99)
HCT: 42 % (ref 39.0–52.0)
Hemoglobin: 14.3 g/dL (ref 13.0–17.0)
Potassium: 3 mmol/L — ABNORMAL LOW (ref 3.5–5.1)
SODIUM: 146 mmol/L — AB (ref 135–145)
TCO2: 24 mmol/L (ref 0–100)

## 2014-12-16 LAB — BASIC METABOLIC PANEL
ANION GAP: 4 — AB (ref 5–15)
BUN: 13 mg/dL (ref 6–23)
CO2: 29 mmol/L (ref 19–32)
CREATININE: 0.61 mg/dL (ref 0.50–1.35)
Calcium: 8.6 mg/dL (ref 8.4–10.5)
Chloride: 111 mmol/L (ref 96–112)
GFR calc non Af Amer: >90 mL/min (ref >90)
Glucose, Bld: 150 mg/dL — ABNORMAL HIGH (ref 70–99)
POTASSIUM: 3.2 mmol/L — AB (ref 3.5–5.1)
SODIUM: 144 mmol/L (ref 135–145)

## 2014-12-16 LAB — GLUCOSE, CAPILLARY
Glucose-Capillary: 130 mg/dL — ABNORMAL HIGH (ref 70–99)
Glucose-Capillary: 137 mg/dL — ABNORMAL HIGH (ref 70–99)
Glucose-Capillary: 172 mg/dL — ABNORMAL HIGH (ref 70–99)
Glucose-Capillary: 256 mg/dL — ABNORMAL HIGH (ref 70–99)
Glucose-Capillary: 174 mg/dL — ABNORMAL HIGH (ref 70–99)

## 2014-12-16 LAB — CBC
HCT: 38.8 % — ABNORMAL LOW (ref 39.0–52.0)
Hemoglobin: 13.1 g/dL (ref 13.0–17.0)
MCH: 31.4 pg (ref 26.0–34.0)
MCHC: 33.8 g/dL (ref 30.0–36.0)
MCV: 93 fL (ref 78.0–100.0)
Platelets: 128 10*3/uL — ABNORMAL LOW (ref 150–400)
RBC: 4.17 MIL/uL — AB (ref 4.22–5.81)
RDW: 13.7 % (ref 11.5–15.5)
WBC: 18.5 10*3/uL — AB (ref 4.0–10.5)

## 2014-12-16 MED ORDER — POTASSIUM CHLORIDE 20 MEQ/15ML (10%) PO SOLN
40.0000 meq | ORAL | Status: AC
  Administered 2014-12-16 (×2): 40 meq via ORAL
  Filled 2014-12-16 (×2): qty 30

## 2014-12-16 MED ORDER — POTASSIUM CHLORIDE CRYS ER 20 MEQ PO TBCR: 40.0000 meq | EXTENDED_RELEASE_TABLET | ORAL | Status: DC

## 2014-12-16 NOTE — Progress Notes (Signed)
RT Note:  Attempted NIF and VC.  Patient off floor in dialysis.

## 2014-12-16 NOTE — Progress Notes (Signed)
Speech Language Pathology Treatment: Dysphagia  Patient Details Name: Craig [name] MRN: [id number] DOB: 1941/04/22 Today's Date: 12/16/2014 Time: 1941-7408 SLP Time Calculation (min) (ACUTE ONLY): 13 min  Assessment / Plan / Recommendation Clinical Impression  Pt lethargic this afternoon but agreeable to speech therapy in order to attempt clearing secretions. SLP provided oral care via suction with 4 ice chip trials to loosen secretions. Pt initially with wet vocal quality prior to initiation of POs, which cleared after strong, reflexive cough x1 with expectoration of small amount of thick secretions. Pt also had immediate throat clearing after each ice chip. Pt declined further trials saying that it was upsetting his stomach - of note, he reports feeling upset to his stomach after ice chips on previous date as well.   HPI HPI: [name], never smoker, transferred to [facility] on 1/22 with acute MG exacerbation & respiratory failure. Decompensated requiring intubation 1/22-1/25.   Pertinent Vitals Pain Assessment: No/denies pain  SLP Plan  Continue with current plan of care    Recommendations Diet recommendations: NPO Medication Administration: Via alternative means              Oral Care Recommendations: Oral care Q4 per protocol Follow up Recommendations: Inpatient Rehab Plan: Continue with current plan of care    GO     Germain Osgood, M.A. CCC-SLP [phone]  Germain Osgood 12/16/2014, 4:44 PM

## 2014-12-16 NOTE — Progress Notes (Signed)
RT unable to obtain a NIF and VC.  Equipment is unavailable at this time.

## 2014-12-16 NOTE — Progress Notes (Signed)
RT Note:  Attempted NIF, Equipment broken.

## 2014-12-16 NOTE — Care Management (Signed)
12-16-14 IM from Medicare given. Fiore Detjen RN BSN  

## 2014-12-16 NOTE — Progress Notes (Signed)
Occupational Therapy Treatment Patient Details Name: Craig [name] MRN: [id number] DOB: 06/30/1941 Today's Date: 12/16/2014    History of present illness Craig [name] is a 74 y.o. 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 Patient having progressive weakness over the past month becoming worse in past week. Pt with acute MG exacerbation & respiratory failure. Decompensated requiring intubation 1/22 pm, extubated 12/08/14, currently on bipap PRN. Pt now with C-diff.   OT comments  Pt only able to tolerate minimal AAROM bil. UEs today due to lethargy.  He reports he had a procedure in middle of night and did not sleep.  Pt unable to sustain arousal to participate in further activity.  Encouraged pt to attempt OOB later today with nursing staff.   Follow Up Recommendations  CIR;Supervision/Assistance - 24 hour    Equipment Recommendations  Tub/shower bench    Recommendations for Other Services      Precautions / Restrictions Precautions Precautions: Fall       Mobility Bed Mobility                  Transfers                      Balance                                   ADL                                         General ADL Comments: Pt very fatigued today - reports he had a procedure done in middle of night and did not sleep.  He was initially agreeable to working with OT, but was unable to maintain arousal while performing UE exercises.  Encouraged pt to attempt OOB later today with nursing staff      Vision                     Perception     Praxis      Cognition   Behavior During Therapy: Flat affect Overall Cognitive Status: Within Functional Limits for tasks assessed                        Extremity/Trunk Assessment               Exercises General Exercises - Upper Extremity Shoulder Flexion: AAROM;Right;Left;10 reps;Supine Shoulder Horizontal ABduction: AAROM;Right;Left;10 reps;Supine Shoulder Horizontal ADduction: AAROM;Right;Left;10 reps;Supine   Shoulder Instructions       General Comments      Pertinent Vitals/ Pain       Pain Assessment: No/denies pain  Home Living                                          Prior Functioning/Environment              Frequency Min 2X/week     Progress Toward Goals  OT Goals(current goals can now be found in the care plan section)  Progress towards OT goals: Not progressing toward goals -  comment (lethargy this date due to procedure)  ADL Goals Pt Will Perform Grooming: with min assist;standing Pt Will Perform Upper Body Bathing: with min assist;sitting Pt Will Perform Lower Body Bathing: with min assist;sit to/from stand Pt Will Perform Upper Body Dressing: with min assist;sitting Pt Will Perform Lower Body Dressing: with min assist;sit to/from stand Pt Will Transfer to Toilet: with min assist;ambulating;bedside commode Pt Will Perform Toileting - Clothing Manipulation and hygiene: with min assist;sit to/from stand Pt/caregiver will Perform Home Exercise Program: Increased strength;Both right and left upper extremity Additional ADL Goal #1: Pt will generalize energy conservation strategies in ADL and mobility with min verbal cues.  Plan Discharge plan remains appropriate    Co-evaluation                 End of Session Equipment Utilized During Treatment: Oxygen   Activity Tolerance Patient limited by lethargy   Patient Left in bed;with call bell/phone within reach;with family/visitor present   Nurse Communication Mobility status        Time: 1240-1250 OT Time Calculation (min): 10 min  Charges: OT Treatments $Therapeutic Exercise: 8-22 mins  Carli Lefevers  M 12/16/2014, 1:05 PM

## 2014-12-16 NOTE — Progress Notes (Signed)
[facility] PROGRESS NOTE  Craig [name] IRS:[id number] DOB: 09-Jul-1941 DOA: 12/05/2014 PCP: Ann Held, MD  Interim summary 74 year old male with  past medical history of myasthenia gravis, metastatic bladder cancer to abdominal wall status post chemotherapy, history of A. fib on chronic anticoagulation who was transferred from [facility] with progressive weakness for the past 1 month . Patient was started on 60 mg oral prednisone in addition to Mestinon therapy by his neurologist despite which his symptoms continue to worsen with difficulty swallowing and progressive shortness of breath. Patient was transferred to [facility] and admitted to  ICU for acute  respiratory failure secondary to acute Myasthenia crisis requiring intubation on 12/05/2014. Started on plasmapheresis on admission. He was successfully extubated on 1/25 followed by when necessary BiPAP. Since then he has been requiring intermittent NPPV and clinically improved. Patient has residual dysphagia, speech impairment, right eye ptosis and proximal upper extremity weakness .Patient started on Mestinon on 1/28 along with prednisone. Neurology closely following. Slow improvement in NIF.Transferred to medical floor.      Assessment/Plan: Acute exacerbation of myasthenia gravis Patient received daily plasmapheresis until 1/30, then another exchange last night.  .. Continue steroid and Mestinon. Neurology following. My need 3 days of IVIG if symptoms not much improved. Continue PT eval. D/c to CIR  once medically stable, hopefully in the next 2 days.  -rt eye ptosis EOM movement have much improved. Proximal muscle strenght slowly improving. Speech impairment persists.  Continue close monitoring of VC and NIF.( not checked today yet)   Dysphagia Secondary to myasthenia gravis flareup with bulbar symptoms Currently receiving NG tube feeds.  Speech and swallow following regularly .  C. difficile diarrhea Diarrhea  resolved.  On empiric Flagyl with plan on 14 day course.( completes on 12/24/2014)  Leukocytosis Likely related to steroid and  C. difficile. Improving.   Hypokalemia Replenish  Hypernatremia  possibly from dehydration. Adjusted tube feeds    Type 2 diabetes mellitus Stable. Holding home medication. Continue sliding scale insulin   Metastatic bladder cancer.  follows with oncologist in [place].  Last chemo in October 2015  Atrial fibrillation On Xarelto at home which has been resumed. Continue amiodarone.    Hypertension Blood pressure stable. Continue amlodipine.   Thrombocytopenia  mild. ?Possibly due to acute illness. improving. Monitor on pepcid.  Diet: Tube feeds  DVT prophylaxis: Xarelto  Code Status: Full code   Family Communication: wife at bedside  Disposition Plan: currently inpt.  inpt rehab upon discharge. Possibly in the next 48 hrs   Consultants:  PCCM  Neurology   renal  Procedures:  intubation on 1/22  plasmapharesis until 1/30 and on 2/1  Antibiotics:  Flagyl 1/28--until 2/10  HPI/Subjective: Patient seen and examined. Still has speech difficulty. UE strength slightly improved.   Objective: Filed Vitals:   12/16/14 0540  BP: 115/57  Pulse: 72  Temp: 97.9 F (36.6 C)  Resp: 21    Intake/Output Summary (Last 24 hours) at 12/16/14 1342 Last data filed at 12/16/14 1025  Gross per 24 hour  Intake 2623.17 ml  Output    975 ml  Net 1648.17 ml   Filed Weights   12/13/14 1325 12/14/14 0604 12/15/14 0518  Weight: 106.2 kg (234 lb 2.1 oz) 106.1 kg (233 lb 14.5 oz) 108.9 kg (240 lb 1.3 oz)    Exam:   General:  Elderly male lying in bed appears fatigued  HEENT: NG in place, mild right eye ptosis, no pallor , moist oral mucosa,  rt IJ for plasmapheresis.  Chest: Clear to auscultation bilaterally, no added sounds, left sided port A cath  CVS: Normal S1 and S2, no murmurs rub or gallop  Abdomen: Soft, nondistended,  nontender, bowel sounds present  musculoskeletal: Warm, no edema.  CNS: Alert and oriented, good movement of EOM, slurred speech,( unchanged) . Some improvement on proximal UE strength.  good strength in bilateral lower extremities and normal sensations.  Data Reviewed: Basic Metabolic Panel:  Recent Labs Lab 12/10/14 0359  12/12/14 0425 12/13/14 0351 12/13/14 1123 12/14/14 0500 12/16/14 0041 12/16/14 0510  NA 140  < > 149* 147* 151* 146* 146* 144  K 4.1  < > 3.3* 3.3* 3.2* 3.7 3.0* 3.2*  CL 108  < > 114* 115* 105 115* 102 111  CO2 28  --  30 28  --  25  --  29  GLUCOSE 139*  < > 179* 141* 164* 201* 134* 150*  BUN 33*  < > 33* 27* 27* 29* 18 13  CREATININE 0.84  < > 0.82 0.80 0.90 0.88 0.70 0.61  CALCIUM 8.9  --  9.7 9.7  --  8.6  --  8.6  < > = values in this interval not displayed. Liver Function Tests: No results for input(s): AST, ALT, ALKPHOS, BILITOT, PROT, ALBUMIN in the last 168 hours. No results for input(s): LIPASE, AMYLASE in the last 168 hours. No results for input(s): AMMONIA in the last 168 hours. CBC:  Recent Labs Lab 12/12/14 0425 12/13/14 1101 12/13/14 1123 12/15/14 0428 12/16/14 0041 12/16/14 0510  WBC 24.2* 22.8*  --  20.7*  --  18.5*  HGB 15.5 16.2 17.0 14.0 14.3 13.1  HCT 45.3 47.4 50.0 40.1 42.0 38.8*  MCV 91.5 93.7  --  91.3  --  93.0  PLT 115* 160  --  112*  --  128*   Cardiac Enzymes: No results for input(s): CKTOTAL, CKMB, CKMBINDEX, TROPONINI in the last 168 hours. BNP (last 3 results) No results for input(s): PROBNP in the last 8760 hours. CBG:  Recent Labs Lab 12/15/14 1951 12/15/14 2326 12/16/14 0342 12/16/14 0744 12/16/14 1215  GLUCAP 180* 139* 130* 137* 172*    Recent Results (from the past 240 hour(s))  Clostridium Difficile by PCR     Status: Abnormal   Collection Time: 12/11/14  7:58 AM  Result Value Ref Range Status   C difficile by pcr POSITIVE (A) NEGATIVE Final    Comment: CRITICAL RESULT CALLED TO, READ BACK  BY AND VERIFIED WITH: S. MARSH RN 11:00 12/11/14 ([email or web address])      Studies: No results found.  Scheduled Meds: . amiodarone  100 mg Per Tube Daily  . amLODipine  5 mg Per Tube Daily  . antiseptic oral rinse  7 mL Mouth Rinse QID  . chlorhexidine  15 mL Mouth Rinse BID  . famotidine  20 mg Per Tube Daily  . free water  200 mL Per Tube Q4H  . insulin aspart  0-20 Units Subcutaneous 6 times per day  . metroNIDAZOLE  500 mg Oral 3 times per day  . predniSONE  60 mg Oral Q breakfast  . pyridostigmine  90 mg Per NG tube QID  . rivaroxaban  20 mg Oral Q supper  . sodium chloride  10-40 mL Intracatheter Q12H   Continuous Infusions: . sodium chloride 10 mL/hr at 12/12/14 1000  . citrate dextrose    . feeding supplement (VITAL AF 1.2 CAL) 1,000 mL (12/16/14 0545)  Time spent: 35 minutes    Louellen Molder  [facility] Pager [phone] If 7PM-7AM, please contact night-coverage at [email or web address], password [facility] 12/16/2014, 1:42 PM  LOS: 11 days

## 2014-12-16 NOTE — Progress Notes (Signed)
NUTRITION FOLLOW UP  Intervention:   Continue Vital AF 1.2 @ 65 ml/hr.   Tube feeding regimen provides 1872 kcal (100% of needs), 117 grams of protein, and 1264 ml of H2O.   TF+ free water flushes (200 ml q 4 hrs) will provide 2464 ml of water daily.    Nutrition Dx:   Inadequate oral intake related to inability to eat as evidenced by NPO/Vent status; ongoing- extubated but remains NPO  Goal:   Pt to meet >/= 90% of their estimated nutrition needs; met  Monitor:   TF rate/tolerance, weight trend, labs  Assessment:   73 y/o M, never smoker, transferred [facility] 1[facility]22 with acute MG exacerbation & respiratory failure. Decompensated requiring intubation 1/22 pm. Hx of diabetes, HTN, and CHF.   Reviewed SLP notes. SLP has continued to follow pt due to dyspahgia; pt is making slow progress, but recommendations continue with NPO due to high risk of aspiration and weakness. Spoke with RN who reports that pt remains strict NPO with continued TF via NGT. Regimen remains Vital AF 1.2 @ 65 ml/hr and 200 ml flush every 4 hours which provides 1872 kcal (100% of needs), 117 grams of protein, and2464 ml of water daily. She reports he continues to tolerate feedings well with minimal to no residuals.  Discharge disposition is for CIR once medically stable. Per rehab admissions, pt with insurance authorization for rehab.  Labs reviewed. K: 3.2, Glucose: 150, CBGS: 130-139.   Height: Ht Readings from Last 1 Encounters:  12/05/14 5' 7" (1.702 m)    Weight Status:   Wt Readings from Last 1 Encounters:  12/15/14 240 lb 1.3 oz (108.9 kg)   12/12/14 232 lb 6.4 oz (105.416 kg)   Re-estimated needs:  Kcal: 1800-2000 Protein: 94-104 grams Fluid: >1.5 L  Skin: Intact  Diet Order:  NPO   Intake/Output Summary (Last 24 hours) at 12/16/14 1153 Last data filed at 12/16/14 1025  Gross per 24 hour  Intake 2623.17 ml  Output    975 ml  Net 1648.17 ml    Last BM: 12/15/14   Labs:   Recent  Labs Lab 12/13/14 0351  12/14/14 0500 12/16/14 0041 12/16/14 0510  NA 147*  < > 146* 146* 144  K 3.3*  < > 3.7 3.0* 3.2*  CL 115*  < > 115* 102 111  CO2 28  --  25  --  29  BUN 27*  < > 29* 18 13  CREATININE 0.80  < > 0.88 0.70 0.61  CALCIUM 9.7  --  8.6  --  8.6  GLUCOSE 141*  < > 201* 134* 150*  < > = values in this interval not displayed.  CBG (last 3)   Recent Labs  12/15/14 2326 12/16/14 0342 12/16/14 0744  GLUCAP 139* 130* 137*    Scheduled Meds: . amiodarone  100 mg Per Tube Daily  . amLODipine  5 mg Per Tube Daily  . antiseptic oral rinse  7 mL Mouth Rinse QID  . chlorhexidine  15 mL Mouth Rinse BID  . famotidine  20 mg Per Tube Daily  . free water  200 mL Per Tube Q4H  . insulin aspart  0-20 Units Subcutaneous 6 times per day  . metroNIDAZOLE  500 mg Oral 3 times per day  . predniSONE  60 mg Oral Q breakfast  . pyridostigmine  90 mg Per NG tube QID  . rivaroxaban  20 mg Oral Q supper  . sodium chloride    10-40 mL Intracatheter Q12H    Continuous Infusions: . sodium chloride 10 mL/hr at 12/12/14 1000  . citrate dextrose    . feeding supplement (VITAL AF 1.2 CAL) 1,000 mL (12/16/14 0545)    Jacobs Golab A. Jimmye Norman, RD, LDN, CDE Pager: [phone] After hours Pager: [phone]

## 2014-12-16 NOTE — Progress Notes (Signed)
Discussed with Dr. Clementeen Graham, pt not medically ready for inpt rehab admission per his discussion with Neuro today. I will follow up tomorrow. [phone]

## 2014-12-16 NOTE — Progress Notes (Signed)
PT Cancellation Note  Patient Details Name: Craig [name] MRN: [id number] DOB: [birth year]   Cancelled Treatment:    Reason Eval/Treat Not Completed: Patient declined, no reason specified Pt hypokalemic today and very drowsy due to having a procedure done all night (from 12 am-4 am per pt report). Pt politely declined participating in therapy due to being tired with difficulty staying aroused during discussion. Will follow up next available time.   Candy Sledge A 12/16/2014, 2:53 PM Candy Sledge, [place], DPT [phone]

## 2014-12-16 NOTE — Progress Notes (Signed)
Unable to obtain NIF/VC, equipment unavailable.  RT to monitor and assess as needed.

## 2014-12-17 ENCOUNTER — Inpatient Hospital Stay (HOSPITAL_COMMUNITY)
Admission: RE | Admit: 2014-12-17 | Discharge: 2014-12-27 | DRG: 057 | Disposition: A | Discharge status: Other Acute Inpatient Hospital | Payer: PPO | Source: Intra-hospital | Attending: Physical Medicine & Rehabilitation | Admitting: Physical Medicine & Rehabilitation

## 2014-12-17 DIAGNOSIS — Z794 Long term (current) use of insulin: Secondary | ICD-10-CM

## 2014-12-17 DIAGNOSIS — Z8551 Personal history of malignant neoplasm of bladder: Secondary | ICD-10-CM | POA: Diagnosis not present

## 2014-12-17 DIAGNOSIS — Z7952 Long term (current) use of systemic steroids: Secondary | ICD-10-CM | POA: Diagnosis not present

## 2014-12-17 DIAGNOSIS — I482 Chronic atrial fibrillation: Secondary | ICD-10-CM | POA: Diagnosis not present

## 2014-12-17 DIAGNOSIS — G7001 Myasthenia gravis with (acute) exacerbation: Secondary | ICD-10-CM | POA: Diagnosis present

## 2014-12-17 DIAGNOSIS — A047 Enterocolitis due to Clostridium difficile: Secondary | ICD-10-CM | POA: Diagnosis not present

## 2014-12-17 DIAGNOSIS — R079 Chest pain, unspecified: Secondary | ICD-10-CM

## 2014-12-17 DIAGNOSIS — C786 Secondary malignant neoplasm of retroperitoneum and peritoneum: Secondary | ICD-10-CM | POA: Diagnosis not present

## 2014-12-17 DIAGNOSIS — G7000 Myasthenia gravis without (acute) exacerbation: Secondary | ICD-10-CM | POA: Diagnosis not present

## 2014-12-17 DIAGNOSIS — Z7901 Long term (current) use of anticoagulants: Secondary | ICD-10-CM

## 2014-12-17 DIAGNOSIS — I10 Essential (primary) hypertension: Secondary | ICD-10-CM | POA: Diagnosis not present

## 2014-12-17 DIAGNOSIS — E876 Hypokalemia: Secondary | ICD-10-CM | POA: Diagnosis not present

## 2014-12-17 DIAGNOSIS — I509 Heart failure, unspecified: Secondary | ICD-10-CM | POA: Diagnosis not present

## 2014-12-17 DIAGNOSIS — R001 Bradycardia, unspecified: Secondary | ICD-10-CM

## 2014-12-17 DIAGNOSIS — A0472 Enterocolitis due to Clostridium difficile, not specified as recurrent: Secondary | ICD-10-CM | POA: Insufficient documentation

## 2014-12-17 DIAGNOSIS — Z79899 Other long term (current) drug therapy: Secondary | ICD-10-CM | POA: Diagnosis not present

## 2014-12-17 DIAGNOSIS — E46 Unspecified protein-calorie malnutrition: Secondary | ICD-10-CM | POA: Diagnosis not present

## 2014-12-17 DIAGNOSIS — R61 Generalized hyperhidrosis: Secondary | ICD-10-CM | POA: Insufficient documentation

## 2014-12-17 DIAGNOSIS — R1312 Dysphagia, oropharyngeal phase: Secondary | ICD-10-CM

## 2014-12-17 DIAGNOSIS — R1314 Dysphagia, pharyngoesophageal phase: Secondary | ICD-10-CM | POA: Insufficient documentation

## 2014-12-17 DIAGNOSIS — R1310 Dysphagia, unspecified: Secondary | ICD-10-CM | POA: Insufficient documentation

## 2014-12-17 DIAGNOSIS — E119 Type 2 diabetes mellitus without complications: Secondary | ICD-10-CM | POA: Diagnosis not present

## 2014-12-17 DIAGNOSIS — R05 Cough: Secondary | ICD-10-CM | POA: Diagnosis not present

## 2014-12-17 LAB — GLUCOSE, CAPILLARY
GLUCOSE-CAPILLARY: 129 mg/dL — AB (ref 70–99)
GLUCOSE-CAPILLARY: 132 mg/dL — AB (ref 70–99)
GLUCOSE-CAPILLARY: 202 mg/dL — AB (ref 70–99)
Glucose-Capillary: 168 mg/dL — ABNORMAL HIGH (ref 70–99)
Glucose-Capillary: 160 mg/dL — ABNORMAL HIGH (ref 70–99)
Glucose-Capillary: 232 mg/dL — ABNORMAL HIGH (ref 70–99)

## 2014-12-17 MED ORDER — ARTIFICIAL TEARS OP OINT
TOPICAL_OINTMENT | OPHTHALMIC | Status: DC | PRN
Start: 2014-12-17 — End: 2014-12-27

## 2014-12-17 MED ORDER — AMLODIPINE BESYLATE 5 MG PO TABS
5.0000 mg | ORAL_TABLET | Freq: Every day | ORAL | Status: DC
  Administered 2014-12-18 – 2014-12-25 (×8): 5 mg
  Filled 2014-12-17 (×10): qty 1

## 2014-12-17 MED ORDER — ACETAMINOPHEN 325 MG PO TABS
325.0000 mg | ORAL_TABLET | ORAL | Status: DC | PRN
  Administered 2014-12-17 – 2014-12-25 (×6): 650 mg
  Filled 2014-12-17 (×6): qty 2

## 2014-12-17 MED ORDER — FLEET ENEMA 7-19 GM/118ML RE ENEM: 1.0000 | ENEMA | Freq: Once | RECTAL | Status: AC | PRN

## 2014-12-17 MED ORDER — FLORANEX PO PACK
1.0000 g | PACK | Freq: Three times a day (TID) | ORAL | Status: DC
  Administered 2014-12-17 – 2014-12-25 (×24): 1 g
  Filled 2014-12-17 (×30): qty 1

## 2014-12-17 MED ORDER — FREE WATER: 200.0000 mL | Status: DC

## 2014-12-17 MED ORDER — ARTIFICIAL TEARS OP OINT: TOPICAL_OINTMENT | OPHTHALMIC | Status: DC | PRN

## 2014-12-17 MED ORDER — ATORVASTATIN CALCIUM 40 MG PO TABS: 40.0000 mg | ORAL_TABLET | Freq: Every day | ORAL | Status: DC

## 2014-12-17 MED ORDER — METOPROLOL TARTRATE 25 MG PO TABS: 12.5000 mg | ORAL_TABLET | Freq: Every day | ORAL | Status: DC

## 2014-12-17 MED ORDER — ALUM & MAG HYDROXIDE-SIMETH 200-200-20 MG/5ML PO SUSP: 30.0000 mL | ORAL | Status: DC | PRN

## 2014-12-17 MED ORDER — INSULIN ASPART 100 UNIT/ML ~~LOC~~ SOLN
0.0000 [IU] | SUBCUTANEOUS | Status: DC
  Administered 2014-12-17 (×2): 7 [IU] via SUBCUTANEOUS
  Administered 2014-12-18: 3 [IU] via SUBCUTANEOUS
  Administered 2014-12-18: 7 [IU] via SUBCUTANEOUS
  Administered 2014-12-18: 3 [IU] via SUBCUTANEOUS
  Administered 2014-12-18: 7 [IU] via SUBCUTANEOUS
  Administered 2014-12-18: 4 [IU] via SUBCUTANEOUS
  Administered 2014-12-19 (×2): 3 [IU] via SUBCUTANEOUS
  Administered 2014-12-19: 7 [IU] via SUBCUTANEOUS
  Administered 2014-12-19: 4 [IU] via SUBCUTANEOUS
  Administered 2014-12-19: 3 [IU] via SUBCUTANEOUS
  Administered 2014-12-19: 7 [IU] via SUBCUTANEOUS
  Administered 2014-12-20: 3 [IU] via SUBCUTANEOUS
  Administered 2014-12-20 (×4): 4 [IU] via SUBCUTANEOUS
  Administered 2014-12-20: 11 [IU] via SUBCUTANEOUS
  Administered 2014-12-21: 4 [IU] via SUBCUTANEOUS
  Administered 2014-12-21: 7 [IU] via SUBCUTANEOUS
  Administered 2014-12-21: 4 [IU] via SUBCUTANEOUS
  Administered 2014-12-21 (×2): 3 [IU] via SUBCUTANEOUS
  Administered 2014-12-22: 4 [IU] via SUBCUTANEOUS
  Administered 2014-12-22: 7 [IU] via SUBCUTANEOUS
  Administered 2014-12-22 (×2): 4 [IU] via SUBCUTANEOUS
  Administered 2014-12-22: 3 [IU] via SUBCUTANEOUS
  Administered 2014-12-23: 4 [IU] via SUBCUTANEOUS
  Administered 2014-12-23: 3 [IU] via SUBCUTANEOUS
  Administered 2014-12-23: 4 [IU] via SUBCUTANEOUS
  Administered 2014-12-23: 7 [IU] via SUBCUTANEOUS
  Administered 2014-12-23: 3 [IU] via SUBCUTANEOUS
  Administered 2014-12-24: 4 [IU] via SUBCUTANEOUS
  Administered 2014-12-24 (×2): 3 [IU] via SUBCUTANEOUS
  Administered 2014-12-24: 4 [IU] via SUBCUTANEOUS
  Administered 2014-12-24 (×2): 3 [IU] via SUBCUTANEOUS
  Administered 2014-12-25: 4 [IU] via SUBCUTANEOUS
  Administered 2014-12-25 (×2): 3 [IU] via SUBCUTANEOUS
  Administered 2014-12-25: 4 [IU] via SUBCUTANEOUS
  Administered 2014-12-25 (×2): 3 [IU] via SUBCUTANEOUS
  Administered 2014-12-26: 2 [IU] via SUBCUTANEOUS

## 2014-12-17 MED ORDER — AMIODARONE HCL 100 MG PO TABS: 100.0000 mg | ORAL_TABLET | Freq: Every day | ORAL | Status: DC

## 2014-12-17 MED ORDER — FREE WATER
200.0000 mL | Status: DC
  Administered 2014-12-17 – 2014-12-26 (×49): 200 mL

## 2014-12-17 MED ORDER — CHLORHEXIDINE GLUCONATE 0.12 % MT SOLN: 15.0000 mL | Freq: Two times a day (BID) | OROMUCOSAL | Status: DC

## 2014-12-17 MED ORDER — RIVAROXABAN 20 MG PO TABS
20.0000 mg | ORAL_TABLET | Freq: Every day | ORAL | Status: DC
  Administered 2014-12-17 – 2014-12-24 (×8): 20 mg via ORAL
  Filled 2014-12-17 (×9): qty 1

## 2014-12-17 MED ORDER — POTASSIUM CHLORIDE 20 MEQ/15ML (10%) PO SOLN
20.0000 meq | Freq: Two times a day (BID) | ORAL | Status: DC
  Administered 2014-12-17 – 2014-12-19 (×4): 20 meq via ORAL
  Filled 2014-12-17 (×6): qty 15

## 2014-12-17 MED ORDER — INSULIN ASPART 100 UNIT/ML ~~LOC~~ SOLN: 0.0000 [IU] | SUBCUTANEOUS | Status: DC

## 2014-12-17 MED ORDER — CETYLPYRIDINIUM CHLORIDE 0.05 % MT LIQD: 7.0000 mL | Freq: Four times a day (QID) | OROMUCOSAL | Status: DC

## 2014-12-17 MED ORDER — PYRIDOSTIGMINE BROMIDE 60 MG PO TABS
90.0000 mg | ORAL_TABLET | Freq: Four times a day (QID) | ORAL | Status: DC
  Administered 2014-12-17 – 2014-12-25 (×34): 90 mg via NASOGASTRIC
  Filled 2014-12-17 (×39): qty 1.5

## 2014-12-17 MED ORDER — CHLORHEXIDINE GLUCONATE 0.12 % MT SOLN
15.0000 mL | Freq: Two times a day (BID) | OROMUCOSAL | Status: DC
  Administered 2014-12-17 – 2014-12-26 (×16): 15 mL via OROMUCOSAL
  Filled 2014-12-17 (×20): qty 15

## 2014-12-17 MED ORDER — BISACODYL 10 MG RE SUPP: 10.0000 mg | Freq: Every day | RECTAL | Status: DC | PRN

## 2014-12-17 MED ORDER — PYRIDOSTIGMINE BROMIDE 60 MG PO TABS: 90.0000 mg | ORAL_TABLET | Freq: Four times a day (QID) | ORAL | Status: DC

## 2014-12-17 MED ORDER — PREDNISONE 20 MG PO TABS: 60.0000 mg | ORAL_TABLET | Freq: Every day | ORAL | Status: DC

## 2014-12-17 MED ORDER — ALBUTEROL SULFATE (2.5 MG/3ML) 0.083% IN NEBU
2.5000 mg | INHALATION_SOLUTION | RESPIRATORY_TRACT | Status: DC | PRN
  Administered 2014-12-18 – 2014-12-21 (×3): 2.5 mg via RESPIRATORY_TRACT
  Filled 2014-12-17 (×3): qty 3

## 2014-12-17 MED ORDER — METRONIDAZOLE 500 MG PO TABS
500.0000 mg | ORAL_TABLET | Freq: Three times a day (TID) | ORAL | Status: DC
  Administered 2014-12-17: 500 mg via ORAL
  Filled 2014-12-17 (×5): qty 1

## 2014-12-17 MED ORDER — SODIUM CHLORIDE 0.9 % IJ SOLN
10.0000 mL | INTRAMUSCULAR | Status: DC | PRN
  Administered 2014-12-17: 10 mL
  Administered 2014-12-19: 20 mL
  Administered 2014-12-19 – 2014-12-22 (×3): 10 mL
  Administered 2014-12-24 – 2014-12-25 (×2): 20 mL
  Filled 2014-12-17 (×7): qty 40

## 2014-12-17 MED ORDER — VITAL AF 1.2 CAL PO LIQD: 1000.0000 mL | ORAL | Status: DC

## 2014-12-17 MED ORDER — VITAL AF 1.2 CAL PO LIQD
1000.0000 mL | ORAL | Status: DC
  Administered 2014-12-17 – 2014-12-18 (×2): 1000 mL
  Filled 2014-12-17 (×5): qty 1000

## 2014-12-17 MED ORDER — FAMOTIDINE 40 MG/5ML PO SUSR
20.0000 mg | Freq: Every day | ORAL | Status: DC
  Administered 2014-12-18 – 2014-12-26 (×9): 20 mg
  Filled 2014-12-17 (×10): qty 2.5

## 2014-12-17 MED ORDER — PREDNISONE 50 MG PO TABS
60.0000 mg | ORAL_TABLET | Freq: Every day | ORAL | Status: DC
  Administered 2014-12-18 – 2014-12-25 (×8): 60 mg via ORAL
  Filled 2014-12-17 (×10): qty 1

## 2014-12-17 MED ORDER — SACCHAROMYCES BOULARDII 250 MG PO CAPS
250.0000 mg | ORAL_CAPSULE | Freq: Two times a day (BID) | ORAL | Status: DC
  Administered 2014-12-17 – 2014-12-25 (×17): 250 mg via ORAL
  Filled 2014-12-17 (×22): qty 1

## 2014-12-17 MED ORDER — METRONIDAZOLE 500 MG PO TABS: 500.0000 mg | ORAL_TABLET | Freq: Three times a day (TID) | ORAL | Status: DC

## 2014-12-17 MED ORDER — CALCIUM POLYCARBOPHIL 625 MG PO TABS
1250.0000 mg | ORAL_TABLET | Freq: Two times a day (BID) | ORAL | Status: DC
  Administered 2014-12-17 – 2014-12-25 (×17): 1250 mg
  Filled 2014-12-17 (×20): qty 2

## 2014-12-17 MED ORDER — AMIODARONE HCL 100 MG PO TABS
100.0000 mg | ORAL_TABLET | Freq: Every day | ORAL | Status: DC
Start: 2014-12-18 — End: 2014-12-27
  Administered 2014-12-18 – 2014-12-25 (×8): 100 mg
  Filled 2014-12-17 (×10): qty 1

## 2014-12-17 MED ORDER — CHOLESTYRAMINE LIGHT 4 G PO PACK
4.0000 g | PACK | Freq: Every day | ORAL | Status: DC
  Administered 2014-12-18 – 2014-12-26 (×9): 4 g via ORAL
  Filled 2014-12-17 (×9): qty 1

## 2014-12-17 MED ORDER — CETYLPYRIDINIUM CHLORIDE 0.05 % MT LIQD
7.0000 mL | Freq: Four times a day (QID) | OROMUCOSAL | Status: DC
  Administered 2014-12-17 – 2014-12-25 (×21): 7 mL via OROMUCOSAL

## 2014-12-17 NOTE — Progress Notes (Signed)
Report called to [facility] on 4MW. Patient going to room [id number].

## 2014-12-17 NOTE — PMR Pre-admission (Signed)
PMR Admission Coordinator Pre-Admission Assessment  Patient: Craig [name] is an 74 y.o., male MRN: [id number] DOB: 05-20-1941 Height: _0  (170.2 cm) Weight: 107.4 kg (236 lb 12.4 oz)              Insurance Information HMO:     PPO: yes     PCP:      IPA:      80/20:      OTHER: Medicare advantage plan PRIMARY: [employer]      Policy#: [id number]      Subscriber: pt CM Name: Craig [name]      Phone#: [phone]     Fax#: [phone] Pre-Cert#: [id number]      Employer: retired  Craig [name] does not need insurance updates sent for she has access to chart Benefits:  Phone #: [phone]     Name: 12/11/14 Eff. Date: 11/14/14     Deduct: none      Out of Pocket Max: $3100      Life Max: none CIR: $225 copay per admit then $75 per day days 2 through 5      SNF: no copay days 1-20; $140 copay per day days 21-100 Outpatient: $10 copay per visit     Co-Pay: no visit limits Home Health: $10 copay per visit      Co-Pay: no visit limit DME: 80%     Co-Pay: 20% Providers: in network  SECONDARY: none        Medicaid Application Date:       Case Manager:  Disability Application Date:       Case Worker:   Emergency Facilities manager Information    Name Relation Home Work Mobile   [name],Craig Spouse [phone]     Siegfried, Vieth   [phone]     Current Medical History  Patient Admitting Diagnosis: Myasthenia Gravis with crisis  History of Present Illness: Craig [name] is a 74 y.o. male with history of A fib, metastatic bladder cancer, recent PNA, MG who has had worsening over past months. He was evaluated by neurology 3 days PTA and was placed on steroids with increase in mestinon due to difficulty swallowing and SOB. Patient continued to worsen and was admitted via [facility] on 12/05/14 with SOB, swallowing difficulty, difficulty hold head up with droopy eyelids and congestion. s. He was intubated due to respiratory distress and started on IV solumedrol as well as plasmapheresis per  input by Dr. Doy Mince. He has responded to treatment and tolerated extubation on 01/25 with use of BIPAP prn. He is able to suction oral secretions and is currently NPO with tube feeds for nutritional support. Pt received a total of 6 PLEX treatments last treatment 12/16/14.  If no significant improvement in Myasthenia gravis symptoms within the next few days, neurology wants to be contacted who may consider IV IG.   Past Medical History  Past Medical History  Diagnosis Date  . Atrial fibrillation, chronic     on Xarelto   . HTN (hypertension)   . CHF (congestive heart failure)   . Diabetes mellitus   . 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  Prior Rehab/Hospitalizations: none  Current Medications   Current facility-administered medications:  .  0.9 %  sodium chloride infusion, , Intravenous, Continuous, Juanito Doom, MD, Last Rate: 10 mL/hr at 12/12/14 1000 .  albuterol (PROVENTIL) (2.5 MG/3ML) 0.083% nebulizer solution 2.5 mg,  2.5 mg, Nebulization, Q3H PRN, Donita Brooks, NP .  amiodarone (PACERONE) tablet 100 mg, 100 mg, Per Tube, Daily, Wilhelmina Mcardle, MD, 100 mg at 12/17/14 1021 .  amLODipine (NORVASC) tablet 5 mg, 5 mg, Per Tube, Daily, Wilhelmina Mcardle, MD, 5 mg at 12/17/14 1021 .  antiseptic oral rinse (CPC / CETYLPYRIDINIUM CHLORIDE 0.05%) solution 7 mL, 7 mL, Mouth Rinse, QID, Juanito Doom, MD, 7 mL at 12/17/14 0511 .  artificial tears (LACRILUBE) ophthalmic ointment, , Both Eyes, Q4H PRN, Juanito Doom, MD .  chlorhexidine (PERIDEX) 0.12 % solution 15 mL, 15 mL, Mouth Rinse, BID, Juanito Doom, MD, 15 mL at 12/17/14 0800 .  citrate dextrose (ACD-A anticoagulant) solution 500 mL, 500 mL, Intravenous, Continuous, Alexis Goodell, MD, 500 mL at 12/16/14 0154 .  famotidine (PEPCID) 40 MG/5ML suspension 20 mg, 20 mg, Per Tube, Daily, Nishant Dhungel, MD, 20  mg at 12/17/14 1021 .  feeding supplement (VITAL AF 1.2 CAL) liquid 1,000 mL, 1,000 mL, Per Tube, Continuous, Baird Lyons, RD, Last Rate: 65 mL/hr at 12/17/14 0550, 1,000 mL at 12/17/14 0550 .  fentaNYL (SUBLIMAZE) injection 12.5-25 mcg, 12.5-25 mcg, Intravenous, Q2H PRN, Wilhelmina Mcardle, MD, 25 mcg at 12/15/14 0100 .  free water 200 mL, 200 mL, Per Tube, Q4H, Wilhelmina Mcardle, MD, 200 mL at 12/17/14 1022 .  insulin aspart (novoLOG) injection 0-20 Units, 0-20 Units, Subcutaneous, 6 times per day, Donita Brooks, NP, 4 Units at 12/17/14 0800 .  metroNIDAZOLE (FLAGYL) tablet 500 mg, 500 mg, Oral, 3 times per day, Raylene Miyamoto, MD, 500 mg at 12/17/14 0550 .  predniSONE (DELTASONE) tablet 60 mg, 60 mg, Oral, Q breakfast, Wilhelmina Mcardle, MD, 60 mg at 12/17/14 0800 .  pyridostigmine (MESTINON) tablet 90 mg, 90 mg, Per NG tube, QID, Roland Rack, MD, 90 mg at 12/17/14 1021 .  rivaroxaban (XARELTO) tablet 20 mg, 20 mg, Oral, Q supper, Wilhelmina Mcardle, MD, 20 mg at 12/16/14 1735 .  sodium chloride 0.9 % injection 10-40 mL, 10-40 mL, Intracatheter, Q12H, Juanito Doom, MD, 10 mL at 12/16/14 1106 .  sodium chloride 0.9 % injection 10-40 mL, 10-40 mL, Intracatheter, PRN, Juanito Doom, MD, 10 mL at 12/17/14 0855  Patients Current Diet: NPO with panda feeds  Precautions / Restrictions Precautions Precautions: Fall Restrictions Weight Bearing Restrictions: No   Prior Activity Level Community (5-7x/wk): active with cane, driving self adls, etc Home Assistive Devices / Equipment Home Assistive Devices/Equipment: Cane (specify quad or straight), CBG Meter, Grab bars in shower Home Equipment: Walker - 2 wheels, Cane - single point, Bedside commode  Prior Functional Level Prior Function Level of Independence: Independent with assistive device(s) Comments: ambulated with cane, bathed and dressed self, fixed easy meals, wife drives  Current Functional Level Cognition  Overall  Cognitive Status: Within Functional Limits for tasks assessed Orientation Level: Oriented X4    Extremity Assessment (includes Sensation/Coordination)  Upper Extremity Assessment: RUE deficits/detail, LUE deficits/detail RUE Deficits / Details: 2/5 shoulder, 4+/5 elbow to hand RUE Coordination: decreased fine motor, decreased gross motor LUE Deficits / Details: 3/5 shoulder, 4+/5 elbow to hand LUE Coordination: decreased fine motor, decreased gross motor  Lower Extremity Assessment: Defer to PT evaluation RLE Deficits / Details: hip flex 3/5, knee flex/ ext 3/5 weakness began with MG flare, LLE WFL    ADLs  Overall ADL's : Needs assistance/impaired Eating/Feeding: NPO Grooming: Wash/dry face, Brushing hair, Oral care, Sitting, Minimal assistance Upper Body  Bathing: Maximal assistance, Sitting Lower Body Bathing: Maximal assistance, Sit to/from stand Upper Body Dressing : Maximal assistance, Sitting Lower Body Dressing: Maximal assistance, Sit to/from stand General ADL Comments: Pt very fatigued today - reports he had a procedure done in middle of night and did not sleep.  He was initially agreeable to working with OT, but was unable to maintain arousal while performing UE exercises.  Encouraged pt to attempt OOB later today with nursing staff    Mobility  Overal bed mobility: Needs Assistance Bed Mobility: Supine to Sit, Sit to Supine Supine to sit: Min assist Sit to supine: Mod assist General bed mobility comments: Received sitting in chair upon PT arrival.    Transfers  Overall transfer level: Needs assistance Equipment used: Rolling walker (2 wheeled) Transfers: Sit to/from Stand Sit to Stand: Min assist Stand pivot transfers: +2 physical assistance, +2 safety/equipment, Min assist General transfer comment: Min A to rise from chair. Stood from chair x4.     Ambulation / Gait / Stairs / Wheelchair Mobility  Ambulation/Gait Ambulation/Gait assistance: Min guard, +2  safety/equipment Ambulation Distance (Feet): 125 Feet (x2 bouts) Assistive device: Rolling walker (2 wheeled) Gait Pattern/deviations: Step-through pattern, Wide base of support, Trunk flexed, Decreased step length - left, Decreased step length - right Gait velocity interpretation: Below normal speed for age/gender General Gait Details: VC's for upright posture and RW proximity. Dyspnea and fatigue present requiring 1 seated rest break. Sa02 88% on 3L 02 [place]. Cues for pursed lip breathing.     Posture / Balance Dynamic Sitting Balance Sitting balance - Comments: Trunk musculature fatigues quickly requiring need for back support.  Balance Overall balance assessment: Needs assistance Sitting-balance support: Feet supported, No upper extremity supported Sitting balance-Leahy Scale: Fair Sitting balance - Comments: Trunk musculature fatigues quickly requiring need for back support.  Standing balance support: During functional activity Standing balance-Leahy Scale: Poor Standing balance comment: walker and min assist for static standing.    Special needs/care consideration BiPAP/CPAP pt uses at home but has not use after initial ICU stay due to difficuoty wearing when he can't lie on his side as he does at home.  Bowel mgmt: loose BMs due to CDiff positive and tube feeds. Likely also his mestinon Bladder mgmt: urinal O2 at 2 liters nasal cannula Yonker suctions self orally Diabetic mgmt yes THN, [place], met with pt and wife 12/12/14 and plans to follow up after d/c. [phone]   Previous Home Environment Living Arrangements: Spouse/significant other  Lives With: Spouse Available Help at Discharge: Family, Available 24 hours/day Type of Home: House Home Layout: One level Home Access: Stairs to enter Entrance Stairs-Rails: Right Entrance Stairs-Number of Steps: 3 Bathroom Shower/Tub: Chiropodist: Standard Bathroom Accessibility: Yes How Accessible: Accessible via  walker [place]: No Additional Comments: wife reports that if he goes home, they will probably need tub bench  Discharge Living Setting Plans for Discharge Living Setting: Patient's home, Lives with (comment), Other (Comment) (wife) Type of Home at Discharge: House Discharge Home Layout: One level Discharge Home Access: Stairs to enter Entrance Stairs-Rails: Right Entrance Stairs-Number of Steps: 3 Discharge Bathroom Shower/Tub: Tub/shower unit Discharge Bathroom Toilet: Standard Discharge Bathroom Accessibility: Yes How Accessible: Accessible via walker Does the patient have any problems obtaining your medications?: No  Social/Family/Support Systems Patient Roles: Spouse, Parent Contact Information: Luster Landsberg, wife Anticipated Caregiver: wife Anticipated Caregiver's Contact Information: see above Ability/Limitations of Caregiver: no limitations Caregiver Availability: 24/7 Discharge Plan Discussed with Primary Caregiver: Yes  Is Caregiver In Agreement with Plan?: Yes Does Caregiver/Family have Issues with Lodging/Transportation while Pt is in Rehab?: No  Goals/Additional Needs Patient/Family Goal for Rehab: Mod I to supervision with PT and OT, supervision to min assist SLP Expected length of stay: ELOS 16-22 days Dietary Needs: NPO with panda feeds at present Pt/Family Agrees to Admission and willing to participate: Yes Program Orientation Provided & Reviewed with Pt/Caregiver Including Roles  & Responsibilities: Yes  Decrease burden of Care through IP rehab admission: n/a  Possible need for SNF placement upon discharge:not anticipated  Patient Condition: This patient's medical and functional status has changed since the consult dated: 1'27/2016 in which the Rehabilitation Physician determined and documented that the patient's condition is appropriate for intensive rehabilitative care in an inpatient rehabilitation facility. See "History of Present Illness"  (above) for medical update. Functional changes are: overall min to mod assist with PT and OT. Totoal assist swallowing with SLP. NPO with panda feeds. Patient's medical and functional status update has been discussed with the Rehabilitation physician and patient remains appropriate for inpatient rehabilitation. Will admit to inpatient rehab today.  Preadmission Screen Completed By:  Cleatrice Burke, 12/17/2014 11:30 AM ______________________________________________________________________   Discussed status with Dr. Naaman Plummer on 12/17/2014 at 1129 and received telephone approval for admission today.  Admission Coordinator:  Cleatrice Burke, time 8307 Date 12/17/2014.

## 2014-12-17 NOTE — Discharge Summary (Signed)
Physician Discharge Summary  Craig [name] WVP:[id number] DOB: 10-14-1941 DOA: 12/05/2014  PCP: Ann Held, MD  Admit date: 12/05/2014 Discharge date: 12/17/2014  Time spent: greater than 30 minutes  Recommendations for Follow-up at [facility]:  1. If no significant improvement in Myasthenia gravis symptoms within the next few days, please contact neurology, who may consider IV Ig. 2. Monitor BMET tomorrow. 3. Continue speech therapy to start diet when clinically appropriate 4. Monitor platelet count periodically.  Discharge Diagnoses:  Principal Problem:   Myasthenia gravis in crisis Active Problems:   Bladder cancer   HTN (hypertension)   A-fib   Chronic anticoagulation   Acute respiratory failure with hypoxia   Acute exacerbation of myasthenia gravis   Hypernatremia   Clostridium difficile diarrhea  Disposition: to inpatient rehab  Discharge Condition: stable  Diet recommendation: currently NPO, getting NG tube feeds  Filed Weights   12/14/14 0604 12/15/14 0518 12/17/14 0410  Weight: 106.1 kg (233 lb 14.5 oz) 108.9 kg (240 lb 1.3 oz) 107.4 kg (236 lb 12.4 oz)    History of present illness/Hospital Course:  74 year old male with past medical history of myasthenia gravis, metastatic bladder cancer to abdominal wall status post chemotherapy, history of A. fib on chronic anticoagulation who was transferred from [facility] with progressive weakness for the past 1 month . Patient was started on 60 mg oral prednisone in addition to Mestinon therapy by his neurologist despite which his symptoms continue to worsen with difficulty swallowing and progressive shortness of breath. Patient was transferred to [facility] [place] and admitted to ICU for acute respiratory failure secondary to acute Myasthenia crisis requiring intubation on 12/05/2014. Started on plasmapheresis on admission. He was successfully extubated on 1/25 followed by when necessary BiPAP. Patient has residual dysphagia,  speech impairment, right eye ptosis and proximal upper extremity weakness.  Patient started on Mestinon on 1/28 along with prednisone. Neurology closely following. Slow improvement in NIF.  Acute exacerbation of myasthenia gravis Neurology consulted.  Patient received plasmapheresis for 6 treatments total, prednisone and Mestinon. PT eval recommends [facility]. Neurology recommends transfer to [facility] today.   -rt eye ptosis EOM movement have much improved. Proximal muscle strenght slowly improving. Speech impairment and swallow difficulty persists.  Needs Continued close monitoring of VC and NIF.  If no significant clinical improvement within the next few days, please notify neurology, and they may consider IVIG at [facility].   Dysphagia Secondary to myasthenia gravis flareup with bulbar symptoms Currently receiving NG tube feeds.  Speech and swallow following regularly .  C. difficile diarrhea Diarrhea resolved. On empiric Flagyl with plan on 14 day course.( completes on 12/24/2014)  Hypokalemia Corrected.  Please monitor at [facility].   Hypernatremia  free water added to tube feeds. Please monitor at [facility].   Type 2 diabetes mellitus Stable. Holding home medication. Continue sliding scale insulin  Metastatic bladder cancer.  follows with oncologist in [place]. Last chemo in October 2015  Atrial fibrillation On Xarelto at home which has been resumed. Continue amiodarone, metoprolol resumed at lower dose.   Hypertension Controlled. On lower dose of metoprolol. Amlodipine held.   Thrombocytopenia mild. ?Possibly due to acute illness. improving. Monitor   Code Status: Full code Family Communication: wife at bedside  Consultants:  PCCM  Neurology  Nephrology  Procedures:  intubation on 1/22  plasmapharesis x6  Antibiotics:  Flagyl 1/28-- 2/10  Discharge Exam: Filed Vitals:   12/17/14 0410  BP: 117/57  Pulse: 77  Temp: 98.2 F (36.8 C)  Resp: 22  General: In chair.  Answers questions.  HEENT: NG in place. Right eye proptosis.  Cardiovascular: regular rate rhythm without murmurs gallops or rubs  Respiratory: clear to auscultation bilaterally without wheezes rhonchi or rales Abdomen soft nontender nondistended Extremities no clubbing cyanosis or edema   Discharge Instructions   Discharge Instructions    Walk with assistance    Complete by:  As directed           Current Discharge Medication List    START taking these medications   Details  antiseptic oral rinse (CPC / CETYLPYRIDINIUM CHLORIDE 0.05%) 0.05 % LIQD solution 7 mLs by Mouth Rinse route QID. Refills: 0    artificial tears (LACRILUBE) OINT ophthalmic ointment Place into both eyes every 4 (four) hours as needed for dry eyes. Refills: 0    chlorhexidine (PERIDEX) 0.12 % solution 15 mLs by Mouth Rinse route 2 (two) times daily. Qty: 120 mL, Refills: 0    insulin aspart (NOVOLOG) 100 UNIT/ML injection Inject 0-20 Units into the skin every 4 (four) hours. Qty: 10 mL, Refills: 11    metroNIDAZOLE (FLAGYL) 500 MG tablet Take 1 tablet (500 mg total) by mouth every 8 (eight) hours. Through 12/24/14, then stop    Nutritional Supplements (FEEDING SUPPLEMENT, VITAL AF 1.2 CAL,) LIQD Place 1,000 mLs into feeding tube continuous.    Water For Irrigation, Sterile (FREE WATER) SOLN Place 200 mLs into feeding tube every 4 (four) hours.      CONTINUE these medications which have CHANGED   Details  amiodarone (PACERONE) 100 MG tablet Place 1 tablet (100 mg total) into feeding tube daily.    atorvastatin (LIPITOR) 40 MG tablet Place 1 tablet (40 mg total) into feeding tube daily.    metoprolol tartrate (LOPRESSOR) 25 MG tablet Place 0.5 tablets (12.5 mg total) into feeding tube daily.    predniSONE (DELTASONE) 20 MG tablet Place 3 tablets (60 mg total) into feeding tube daily with breakfast.    pyridostigmine (MESTINON) 60 MG tablet 1.5 tablets (90 mg total) by Per NG tube route 4 (four) times  daily.      CONTINUE these medications which have NOT CHANGED   Details  ipratropium (ATROVENT HFA) 17 MCG/ACT inhaler Inhale 1 puff into the lungs every 6 (six) hours as needed for wheezing.    rivaroxaban (XARELTO) 20 MG TABS tablet Take 20 mg by mouth daily with supper.      STOP taking these medications     acetaminophen (TYLENOL) 650 MG CR tablet      amLODipine (NORVASC) 10 MG tablet      Ascorbic Acid (VITAMIN C) 1000 MG tablet      Boswellia-Glucosamine-Vit D (GLUCOSAMINE COMPLEX PO)      furosemide (LASIX) 80 MG tablet      IRON PO      metFORMIN (GLUCOPHAGE) 500 MG tablet      metolazone (ZAROXOLYN) 2.5 MG tablet      Multiple Vitamin (MULTIVITAMIN WITH MINERALS) TABS tablet      nateglinide (STARLIX) 120 MG tablet      Omega-3 Fatty Acids (FISH OIL) 1000 MG CAPS      pantoprazole (PROTONIX) 40 MG tablet      potassium chloride SA (K-DUR,KLOR-CON) 20 MEQ tablet      zinc gluconate 50 MG tablet        No Known Allergies    The results of significant diagnostics from this hospitalization (including imaging, microbiology, ancillary and laboratory) are listed below for reference.  Significant Diagnostic Studies: Dg Chest Port 1 View  12/11/2014   CLINICAL DATA:  Follow-up of respiratory failure, history of CHF, myasthenia gravis, and bladder malignancy  EXAM: PORTABLE CHEST - 1 VIEW  COMPARISON:  Portable chest x-ray of December 09, 2014  FINDINGS: Significant change in positioning is noted on today's study. There is minimal increased density above the dome of the right hemidiaphragm consistent with atelectasis. On the left the retrocardiac region is dense. The left lateral costophrenic angle is excluded from the study. The heart is top-normal in size. The pulmonary vascularity is not engorged. The right internal jugular venous catheter tip projects over the proximal third of the SVC. The Port-A-Cath appliance tip projects over the middle third of the SVC. The  esophagogastric tube tip projects below the inferior margin of the image.  IMPRESSION: Bibasilar atelectasis has developed. There is no evidence of CHF. No large pleural effusion is demonstrated. The support tubes and lines are in appropriate position.   Electronically Signed   By: David  Martinique   On: 12/11/2014 08:01   Dg Chest Port 1 View  12/09/2014   CLINICAL DATA:  Respiratory failure.  EXAM: PORTABLE CHEST - 1 VIEW  COMPARISON:  December 08, 2014.  FINDINGS: Stable cardiomediastinal silhouette. Endotracheal tube has been removed. Nasogastric tube is seen entering stomach. Stable left subclavian Port-A-Cath is noted with distal tip at expected position of cavoatrial junction. Stable right internal jugular catheter is noted with tip in expected position of the SVC. No pneumothorax or significant pleural effusion is noted. Minimal bibasilar subsegmental atelectasis is noted. Bony thorax is intact.  IMPRESSION: Minimal bibasilar subsegmental atelectasis. Endotracheal tube has been removed. Otherwise stable support apparatus.   Electronically Signed   By: Sabino Dick M.D.   On: 12/09/2014 08:03   Dg Chest Port 1 View  12/08/2014   CLINICAL DATA:  Respiratory failure.  Hypoxia.  EXAM: PORTABLE CHEST - 1 VIEW  COMPARISON:  12/07/2014.  FINDINGS: Endotracheal tube has been withdrawn, its tip is 2.8 cm above the carina. Right IJ line, power port catheter in stable position. Bibasilar subsegmental atelectasis and/or infiltrates again noted these findings have progressed. Small left pleural effusion. No pneumothorax. No acute osseous abnormality.  IMPRESSION: 1. Endotracheal tube has been withdrawn, its tip is 2.8 cm above the carina. Right IJ line and power port catheter stable position. 2. Interim progression of bibasilar subsegmental atelectasis and/or infiltrates. Small left pleural effusion cannot be excluded.   Electronically Signed   By: Marcello Moores  Register   On: 12/08/2014 07:49   Dg Chest Port 1  View  12/07/2014   CLINICAL DATA:  Acute respiratory failure with hypoxia  EXAM: PORTABLE CHEST - 1 VIEW  COMPARISON:  12/06/2014  FINDINGS: Endotracheal tube with tip at the carina or right mainstem bronchus orifice. Left subclavian porta catheter and right IJ catheter are in stable position. Gastric suction tube continues into the stomach.  Worsening retrocardiac aeration, likely atelectasis. No edema, effusion, or pneumothorax. Heart size and mediastinal contours are stable.  These results were called by telephone at the time of interpretation on 12/07/2014 at 6:21 am to [place] , who verbally acknowledged these results.  IMPRESSION: 1. Endotracheal tube tip at the carina or proximal right mainstem. Recommend retraction by 3 cm. 2. New retrocardiac atelectasis, possibly exacerbated by #1.   Electronically Signed   By: Jorje Guild M.D.   On: 12/07/2014 06:21   Dg Chest Port 1 View  12/06/2014   CLINICAL DATA:  Acute respiratory failure  EXAM: PORTABLE CHEST - 1 VIEW  COMPARISON:  12/05/2014  FINDINGS: Mildly low endotracheal tube, tip 1.5 cm above the carina. Left subclavian porta catheter and right IJ central line are in unchanged position. A gastric suction tube reaches the stomach at least.  Unchanged hypoaeration with bibasilar atelectasis, more extensive on the left. Cannot exclude a trace left pleural effusion. No pneumothorax.  IMPRESSION: 1. Lower endotracheal tube, tip 1.5 cm above the carina. 2. Unchanged bibasilar atelectasis.   Electronically Signed   By: Jorje Guild M.D.   On: 12/06/2014 06:21   Dg Chest Port 1 View  12/05/2014   CLINICAL DATA:  Central line placement.  Initial encounter.  EXAM: PORTABLE CHEST - 1 VIEW  COMPARISON:  Chest radiograph performed earlier today at 11:20 a.m.  FINDINGS: The patient's endotracheal tube is seen ending 2 cm above the carina. A left-sided chest port is noted ending about the distal SVC. An enteric tube is noted extending overlying  the body of the stomach. A right IJ sheath is seen ending overlying the mid SVC.  Lung expansion is mildly improved. Mild left basilar opacity may reflect atelectasis or possibly mild pneumonia. No pleural effusion or pneumothorax is seen.  The cardiomediastinal silhouette is mildly enlarged. No acute osseous abnormalities are identified.  IMPRESSION: 1. Right IJ sheath seen ending about the mid SVC. 2. Endotracheal tube noted ending 2 cm above the carina. 3. Enteric tube seen extending overlying the body of the stomach. 4. Lungs mildly hypoexpanded. Mild left basilar airspace opacity may reflect atelectasis or possibly mild pneumonia, slightly better characterized than on the prior study. 5. Mild cardiomegaly.   Electronically Signed   By: Garald Balding M.D.   On: 12/05/2014 20:43    Microbiology: Recent Results (from the past 240 hour(s))  Clostridium Difficile by PCR     Status: Abnormal   Collection Time: 12/11/14  7:58 AM  Result Value Ref Range Status   C difficile by pcr POSITIVE (A) NEGATIVE Final    Comment: CRITICAL RESULT CALLED TO, READ BACK BY AND VERIFIED WITH: S. MARSH RN 11:00 12/11/14 ([email or web address])      Labs: Basic Metabolic Panel:  Recent Labs Lab 12/12/14 0425 12/13/14 0351 12/13/14 1123 12/14/14 0500 12/16/14 0041 12/16/14 0510  NA 149* 147* 151* 146* 146* 144  K 3.3* 3.3* 3.2* 3.7 3.0* 3.2*  CL 114* 115* 105 115* 102 111  CO2 30 28  --  25  --  29  GLUCOSE 179* 141* 164* 201* 134* 150*  BUN 33* 27* 27* 29* 18 13  CREATININE 0.82 0.80 0.90 0.88 0.70 0.61  CALCIUM 9.7 9.7  --  8.6  --  8.6   Liver Function Tests: No results for input(s): AST, ALT, ALKPHOS, BILITOT, PROT, ALBUMIN in the last 168 hours. No results for input(s): LIPASE, AMYLASE in the last 168 hours. No results for input(s): AMMONIA in the last 168 hours. CBC:  Recent Labs Lab 12/12/14 0425 12/13/14 1101 12/13/14 1123 12/15/14 0428 12/16/14 0041 12/16/14 0510  WBC 24.2* 22.8*  --  20.7*  --   18.5*  HGB 15.5 16.2 17.0 14.0 14.3 13.1  HCT 45.3 47.4 50.0 40.1 42.0 38.8*  MCV 91.5 93.7  --  91.3  --  93.0  PLT 115* 160  --  112*  --  128*   Cardiac Enzymes: No results for input(s): CKTOTAL, CKMB, CKMBINDEX, TROPONINI in the last 168 hours. BNP: BNP (last 3 results) No results for input(s):  BNP in the last 8760 hours.  ProBNP (last 3 results) No results for input(s): PROBNP in the last 8760 hours.  CBG:  Recent Labs Lab 12/16/14 1622 12/16/14 2014 12/17/14 0027 12/17/14 0412 12/17/14 0747  GLUCAP 256* 174* 129* 132* 168*       Signed:  Connor Meacham L  [facility] 12/17/2014, 10:56 AM

## 2014-12-17 NOTE — Progress Notes (Signed)
Meredith Staggers, MD Physician Signed Physical Medicine and Rehabilitation Consult Note 12/10/2014 2:53 PM  Related encounter: Admission (Current) from 12/05/2014 in [facility] Collapse All        Physical Medicine and Rehabilitation Consult  Reason for Consult: MG crises Referring Physician: Dr. Alva Garnet.    HPI: Craig [name] is a 74 y.o. male with history of A fib, metastatic bladder cancer, recent PNA, MG who has had worsening over past months. He was evaluated by neurology 3 days PTA and was placed on steroids with increase in mestinon due to difficulty swallowing and SOB. Patient continued to worsen and was admitted via [facility] on 12/05/14 with SOB, swallowing difficulty, difficulty hold head up with droopy eyelids and congestion. s. He was intubated due to respiratory distress and started on IV solumedrol as well as plasmapheresis per input by Dr. Doy Mince. He has responded to treatment and tolerated extubation on 01/25 with use of BIPAP prn. He is able to suction oral secretions and is currently NPO with tube feeds for nutritional support. Therapy initiated today and patient noted to have right sided as well as truncal weakness impacting mobility. CIR recommended by MD and rehab team.    Review of Systems  Unable to perform ROS: other      Past Medical History  Diagnosis Date  . Atrial fibrillation, chronic     on Xarelto   . HTN (hypertension)   . CHF (congestive heart failure)   . Diabetes mellitus   . 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 Abdominal wall metastasis removal      History reviewed. No pertinent family history.    Social History: reports that he has never smoked. He does not have any smokeless tobacco history on file. He reports that he does not drink alcohol  or use illicit drugs.    Allergies: No Known Allergies    Medications Prior to Admission  Medication Sig Dispense Refill  . acetaminophen (TYLENOL) 650 MG CR tablet Take 650 mg by mouth 2 (two) times daily as needed for pain.    Marland Kitchen amiodarone (PACERONE) 100 MG tablet Take 100 mg by mouth daily.    Marland Kitchen amLODipine (NORVASC) 10 MG tablet Take 10 mg by mouth daily.    . Ascorbic Acid (VITAMIN C) 1000 MG tablet Take 1,000 mg by mouth daily.    Marland Kitchen atorvastatin (LIPITOR) 40 MG tablet Take 40 mg by mouth daily.    . [place]-Glucosamine-Vit D (GLUCOSAMINE COMPLEX PO) Take 1 tablet by mouth daily.    . furosemide (LASIX) 80 MG tablet Take 80 mg by mouth daily.    Marland Kitchen ipratropium (ATROVENT HFA) 17 MCG/ACT inhaler Inhale 1 puff into the lungs every 6 (six) hours as needed for wheezing.    . IRON PO Take 1 tablet by mouth daily. 65mg     . metFORMIN (GLUCOPHAGE) 500 MG tablet Take 500 mg by mouth 2 (two) times daily with a meal.    . metolazone (ZAROXOLYN) 2.5 MG tablet Take 2.5 mg by mouth daily as needed. Only takes if he gains extra 5 pounds to help with the lasix per wife    . metoprolol tartrate (LOPRESSOR) 25 MG tablet Take 25 mg by mouth daily.    . Multiple Vitamin (MULTIVITAMIN WITH MINERALS) TABS tablet Take 1 tablet by mouth daily.    Marland Kitchen  nateglinide (STARLIX) 120 MG tablet Take 120 mg by mouth 3 (three) times daily with meals.    . Omega-3 Fatty Acids (FISH OIL) 1000 MG CAPS Take 1 capsule by mouth daily.    . pantoprazole (PROTONIX) 40 MG tablet Take 40 mg by mouth daily.    . potassium chloride SA (K-DUR,KLOR-CON) 20 MEQ tablet Take 20 mEq by mouth 3 (three) times daily.    . predniSONE (DELTASONE) 20 MG tablet Take 20 mg by mouth 3 (three) times daily.    Marland Kitchen pyridostigmine (MESTINON) 60 MG tablet Take 60 mg by mouth 3 (three) times daily.    . rivaroxaban (XARELTO) 20 MG TABS tablet Take 20 mg  by mouth daily with supper.    . zinc gluconate 50 MG tablet Take 50 mg by mouth daily.      Home: Home Living Family/patient expects to be discharged to:: Private residence Living Arrangements: Spouse/significant other Available Help at Discharge: Family, Available 24 hours/day Type of Home: House Home Access: Stairs to enter Technical brewer of Steps: 3 Entrance Stairs-Rails: Right Home Layout: One Ford: Environmental consultant - 2 wheels, Cane - single point, Bedside commode Additional Comments: wife reports that if he goes home, they will probably need tub bench  Functional History: Prior Function Level of Independence: Independent with assistive device(s) Comments: ambulated with cane, bathed and dressed self, fixed easy meals, wife drives Functional Status:  Mobility: Bed Mobility Overal bed mobility: Needs Assistance Bed Mobility: Supine to Sit Supine to sit: Min assist General bed mobility comments: pivoted pt to prevent laying him flat due to breathing issues. Pt able to slide legs to EOB, min A to rotatie hips and slide hips fwd to EOB Transfers Overall transfer level: Needs assistance Equipment used: 2 person hand held assist Transfers: Sit to/from Stand, Stand Pivot Transfers Sit to Stand: Min assist, +2 safety/equipment, +2 physical assistance Stand pivot transfers: +2 physical assistance, +2 safety/equipment, Min assist General transfer comment: pt fatigued once sitting but motivated to get OOB. Min HHA on each side for sit to stand and pivot. Cervical flexion maintained, small pivot steps with feet. Pt lacked energy to do more than this today. Ambulation/Gait General Gait Details: unable today    ADL:    Cognition: Cognition Overall Cognitive Status: Within Functional Limits for tasks assessed Orientation Level: Appropriate for developmental age, Oriented to person, Oriented to place, Oriented to situation Cognition Arousal/Alertness:  Awake/alert Behavior During Therapy: WFL for tasks assessed/performed Overall Cognitive Status: Within Functional Limits for tasks assessed  Blood pressure 113/74, pulse 71, temperature 97.5 F (36.4 C), temperature source Oral, resp. rate 20, height 5\' 7"  (1.702 m), weight 106.6 kg (235 lb 0.2 oz), SpO2 96 %. Physical Exam  Nursing note and vitals reviewed. Constitutional: He appears well-developed and well-nourished.  Fatigued. Resting on BIPAP but able to participate in exam. Panda in place with dry blood on nares.  HENT:  Head: Normocephalic and atraumatic.  Eyes: Left conjunctiva is injected.  Right ptosis.  Cardiovascular: Normal rate and regular rhythm.  Respiratory: Effort normal. No respiratory distress. He has no wheezes. He has rhonchi.  GI: Soft. Bowel sounds are normal. He exhibits no distension. There is no tenderness.  Musculoskeletal: He exhibits no edema or tenderness.  Neurological: He is alert.  Facial diplegia. Poor oral-motor control. Soft voice. Ptosis bilaterally, left more than right. Followed motor commands without difficulty. Reasonable insight and awareness. Sensory exam intact to PP and LT. Strength 3/5 UE's bilaterally. LE's 3/5 hf,  ke, 3+ ankles.  Skin: Skin is warm and dry.  Psychiatric: He has a normal mood and affect. His behavior is normal.     Lab Results Last 24 Hours    Results for orders placed or performed during the hospital encounter of 12/05/14 (from the past 24 hour(s))  Glucose, capillary Status: Abnormal   Collection Time: 12/09/14 4:02 PM  Result Value Ref Range   Glucose-Capillary 238 (H) 70 - 99 mg/dL   Comment 1 Notify RN   Glucose, capillary Status: Abnormal   Collection Time: 12/09/14 7:25 PM  Result Value Ref Range   Glucose-Capillary 249 (H) 70 - 99 mg/dL   Comment 1 Capillary Sample    Comment 2 Documented in Chart    Comment 3 Notify RN   Glucose, capillary Status:  Abnormal   Collection Time: 12/09/14 11:34 PM  Result Value Ref Range   Glucose-Capillary 200 (H) 70 - 99 mg/dL   Comment 1 Capillary Sample    Comment 2 Documented in Chart    Comment 3 Notify RN   Glucose, capillary Status: Abnormal   Collection Time: 12/10/14 3:48 AM  Result Value Ref Range   Glucose-Capillary 142 (H) 70 - 99 mg/dL   Comment 1 Capillary Sample    Comment 2 Documented in Chart    Comment 3 Notify RN   BMET in AM Status: Abnormal   Collection Time: 12/10/14 3:59 AM  Result Value Ref Range   Sodium 140 135 - 145 mmol/L   Potassium 4.1 3.5 - 5.1 mmol/L   Chloride 108 96 - 112 mmol/L   CO2 28 19 - 32 mmol/L   Glucose, Bld 139 (H) 70 - 99 mg/dL   BUN 33 (H) 6 - 23 mg/dL   Creatinine, Ser 0.84 0.50 - 1.35 mg/dL   Calcium 8.9 8.4 - 10.5 mg/dL   GFR calc non Af Amer 84 (L) >90 mL/min   GFR calc Af Amer >90 >90 mL/min   Anion gap 4 (L) 5 - 15  Glucose, capillary Status: Abnormal   Collection Time: 12/10/14 8:09 AM  Result Value Ref Range   Glucose-Capillary 145 (H) 70 - 99 mg/dL   Comment 1 Notify RN   Glucose, capillary Status: Abnormal   Collection Time: 12/10/14 11:49 AM  Result Value Ref Range   Glucose-Capillary 157 (H) 70 - 99 mg/dL   Comment 1 Notify RN       Imaging Results (Last 48 hours)    Dg Chest Port 1 View  12/09/2014 CLINICAL DATA: Respiratory failure. EXAM: PORTABLE CHEST - 1 VIEW COMPARISON: December 08, 2014. FINDINGS: Stable cardiomediastinal silhouette. Endotracheal tube has been removed. Nasogastric tube is seen entering stomach. Stable left subclavian Port-A-Cath is noted with distal tip at expected position of cavoatrial junction. Stable right internal jugular catheter is noted with tip in expected position of the SVC. No pneumothorax or significant pleural effusion is noted. Minimal bibasilar  subsegmental atelectasis is noted. Bony thorax is intact. IMPRESSION: Minimal bibasilar subsegmental atelectasis. Endotracheal tube has been removed. Otherwise stable support apparatus. Electronically Signed By: Sabino Dick M.D. On: 12/09/2014 08:03     Assessment/Plan: Diagnosis: Myasthenia gravis with crisis 1. Does the need for close, 24 hr/day medical supervision in concert with the patient's rehab needs make it unreasonable for this patient to be served in a less intensive setting? Yes 2. Co-Morbidities requiring supervision/potential complications: htn, afib, severe dysphagia 3. Due to bladder management, bowel management, safety, skin/wound care, disease management, medication administration, pain management and  patient education, does the patient require 24 hr/day rehab nursing? Yes 4. Does the patient require coordinated care of a physician, rehab nurse, PT (1-2 hrs/day, 5 days/week), OT (1-2 hrs/day, 5 days/week) and SLP (1-2 hrs/day, 5 days/week) to address physical and functional deficits in the context of the above medical diagnosis(es)? Yes Addressing deficits in the following areas: balance, endurance, locomotion, strength, transferring, bowel/bladder control, bathing, dressing, feeding, grooming, toileting, speech, swallowing and psychosocial support 5. Can the patient actively participate in an intensive therapy program of at least 3 hrs of therapy per day at least 5 days per week? Yes 6. The potential for patient to make measurable gains while on inpatient rehab is excellent 7. Anticipated functional outcomes upon discharge from inpatient rehab are modified independent and supervision with PT, modified independent and supervision with OT, modified independent, supervision and min assist with SLP. 8. Estimated rehab length of stay to reach the above functional goals is: 16-22 days 9. Does the patient have adequate social supports and living environment to accommodate these  discharge functional goals? Yes 10. Anticipated D/C setting: Home 11. Anticipated post D/C treatments: HH therapy and Outpatient therapy 12. Overall Rehab/Functional Prognosis: excellent  RECOMMENDATIONS: This patient's condition is appropriate for continued rehabilitative care in the following setting: CIR Patient has agreed to participate in recommended program. Yes Note that insurance prior authorization may be required for reimbursement for recommended care.  Comment: Rehab Admissions Coordinator to follow up.  Thanks,  Meredith Staggers, MD, [facility]     12/10/2014       Revision History     Date/Time User Provider Type Action   12/11/2014 9:12 AM Meredith Staggers, MD Physician Sign   12/10/2014 5:16 PM Bary Leriche, PA-C Physician Assistant Pend   View Details Report       Routing History     Date/Time From To Method   12/11/2014 9:12 AM Meredith Staggers, MD Meredith Staggers, MD In Basket

## 2014-12-17 NOTE — Progress Notes (Signed)
Cleatrice Burke, RN Rehab Admission Coordinator Signed Physical Medicine and Rehabilitation PMR Pre-admission 12/17/2014 8:37 AM  Related encounter: Admission (Current) from 12/05/2014 in [facility]   PMR Admission Coordinator Pre-Admission Assessment  Patient: Craig [name] is an 74 y.o., male MRN: [id number] DOB: Feb 13, 1941 Height: _0  (170.2 cm) Weight: 107.4 kg (236 lb 12.4 oz)  Insurance Information HMO: PPO: yes PCP: IPA: 80/20: OTHER: Medicare advantage plan PRIMARY: [employer] Policy#: [id number] Subscriber: pt CM Name: Enrique Sack Phone#: [phone] Fax#: [phone] Pre-Cert#: [id number] Employer: retired Enrique Sack does not need insurance updates sent for she has access to chart Benefits: Phone #: [phone] Name: 12/11/14 Eff. Date: 11/14/14 Deduct: none Out of Pocket Max: $3100 Life Max: none CIR: $225 copay per admit then $75 per day days 2 through 5 SNF: no copay days 1-20; $140 copay per day days 21-100 Outpatient: $10 copay per visit Co-Pay: no visit limits Home Health: $10 copay per visit Co-Pay: no visit limit DME: 80% Co-Pay: 20% Providers: in network  SECONDARY: none   Medicaid Application Date: Case Manager:  Disability Application Date: Case Worker:   Emergency Facilities manager Information    Name Relation Home Work Mobile   Saperstein,Martha Spouse [phone]     Therron, Sells   [phone]     Current Medical History  Patient Admitting Diagnosis: Myasthenia Gravis with crisis  History of Present Illness: Craig [name] is a 74 y.o. male with history of A fib, metastatic bladder cancer, recent PNA, MG who has  had worsening over past months. He was evaluated by neurology 3 days PTA and was placed on steroids with increase in mestinon due to difficulty swallowing and SOB. Patient continued to worsen and was admitted via [facility] on 12/05/14 with SOB, swallowing difficulty, difficulty hold head up with droopy eyelids and congestion. s. He was intubated due to respiratory distress and started on IV solumedrol as well as plasmapheresis per input by Dr. Doy Mince. He has responded to treatment and tolerated extubation on 01/25 with use of BIPAP prn. He is able to suction oral secretions and is currently NPO with tube feeds for nutritional support. Pt received a total of 6 PLEX treatments last treatment 12/16/14.  If no significant improvement in Myasthenia gravis symptoms within the next few days, neurology wants to be contacted who may consider IV IG.   Past Medical History  Past Medical History  Diagnosis Date  . Atrial fibrillation, chronic     on Xarelto   . HTN (hypertension)   . CHF (congestive heart failure)   . Diabetes mellitus   . 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  Prior Rehab/Hospitalizations: none  Current Medications   Current facility-administered medications:  . 0.9 % sodium chloride infusion, , Intravenous, Continuous, Juanito Doom, MD, Last Rate: 10 mL/hr at 12/12/14 1000 . albuterol (PROVENTIL) (2.5 MG/3ML) 0.083% nebulizer solution 2.5 mg, 2.5 mg, Nebulization, Q3H PRN, Donita Brooks, NP . amiodarone (PACERONE) tablet 100 mg, 100 mg, Per Tube, Daily, Wilhelmina Mcardle, MD, 100 mg at 12/17/14 1021 . amLODipine (NORVASC) tablet 5 mg, 5 mg, Per Tube, Daily, Wilhelmina Mcardle, MD, 5 mg at 12/17/14 1021 . antiseptic oral rinse (CPC / CETYLPYRIDINIUM CHLORIDE 0.05%) solution 7 mL, 7 mL, Mouth Rinse, QID, Juanito Doom, MD, 7 mL at 12/17/14 0511 .  artificial tears (LACRILUBE) ophthalmic ointment, , Both Eyes, Q4H PRN, Juanito Doom, MD . chlorhexidine (PERIDEX) 0.12 % solution 15 mL, 15 mL, Mouth Rinse, BID, Juanito Doom, MD, 15 mL at 12/17/14 0800 . citrate dextrose (ACD-A anticoagulant) solution 500 mL, 500 mL, Intravenous, Continuous, Alexis Goodell, MD, 500 mL at 12/16/14 0154 . famotidine (PEPCID) 40 MG/5ML suspension 20 mg, 20 mg, Per Tube, Daily, Nishant Dhungel, MD, 20 mg at 12/17/14 1021 . feeding supplement (VITAL AF 1.2 CAL) liquid 1,000 mL, 1,000 mL, Per Tube, Continuous, Baird Lyons, RD, Last Rate: 65 mL/hr at 12/17/14 0550, 1,000 mL at 12/17/14 0550 . fentaNYL (SUBLIMAZE) injection 12.5-25 mcg, 12.5-25 mcg, Intravenous, Q2H PRN, Wilhelmina Mcardle, MD, 25 mcg at 12/15/14 0100 . free water 200 mL, 200 mL, Per Tube, Q4H, Wilhelmina Mcardle, MD, 200 mL at 12/17/14 1022 . insulin aspart (novoLOG) injection 0-20 Units, 0-20 Units, Subcutaneous, 6 times per day, Donita Brooks, NP, 4 Units at 12/17/14 0800 . metroNIDAZOLE (FLAGYL) tablet 500 mg, 500 mg, Oral, 3 times per day, Raylene Miyamoto, MD, 500 mg at 12/17/14 0550 . predniSONE (DELTASONE) tablet 60 mg, 60 mg, Oral, Q breakfast, Wilhelmina Mcardle, MD, 60 mg at 12/17/14 0800 . pyridostigmine (MESTINON) tablet 90 mg, 90 mg, Per NG tube, QID, Roland Rack, MD, 90 mg at 12/17/14 1021 . rivaroxaban (XARELTO) tablet 20 mg, 20 mg, Oral, Q supper, Wilhelmina Mcardle, MD, 20 mg at 12/16/14 1735 . sodium chloride 0.9 % injection 10-40 mL, 10-40 mL, Intracatheter, Q12H, Juanito Doom, MD, 10 mL at 12/16/14 1106 . sodium chloride 0.9 % injection 10-40 mL, 10-40 mL, Intracatheter, PRN, Juanito Doom, MD, 10 mL at 12/17/14 0855  Patients Current Diet: NPO with panda feeds  Precautions / Restrictions Precautions Precautions: Fall Restrictions Weight Bearing Restrictions: No   Prior Activity Level Community (5-7x/wk): active with cane, driving self adls,  etc Home Assistive Devices / Equipment Home Assistive Devices/Equipment: Cane (specify quad or straight), CBG Meter, Grab bars in shower Home Equipment: Walker - 2 wheels, Cane - single point, Bedside commode  Prior Functional Level Prior Function Level of Independence: Independent with assistive device(s) Comments: ambulated with cane, bathed and dressed self, fixed easy meals, wife drives  Current Functional Level Cognition  Overall Cognitive Status: Within Functional Limits for tasks assessed Orientation Level: Oriented X4   Extremity Assessment (includes Sensation/Coordination)  Upper Extremity Assessment: RUE deficits/detail, LUE deficits/detail RUE Deficits / Details: 2/5 shoulder, 4+/5 elbow to hand RUE Coordination: decreased fine motor, decreased gross motor LUE Deficits / Details: 3/5 shoulder, 4+/5 elbow to hand LUE Coordination: decreased fine motor, decreased gross motor  Lower Extremity Assessment: Defer to PT evaluation RLE Deficits / Details: hip flex 3/5, knee flex/ ext 3/5 weakness began with MG flare, LLE WFL    ADLs  Overall ADL's : Needs assistance/impaired Eating/Feeding: NPO Grooming: Wash/dry face, Brushing hair, Oral care, Sitting, Minimal assistance Upper Body Bathing: Maximal assistance, Sitting Lower Body Bathing: Maximal assistance, Sit to/from stand Upper Body Dressing : Maximal assistance, Sitting Lower Body Dressing: Maximal assistance, Sit to/from stand General ADL Comments: Pt very fatigued today - reports he had a procedure done in middle of night and did not sleep. He was initially agreeable to working with OT, but was unable to maintain arousal while performing UE exercises. Encouraged pt to attempt OOB later today with nursing staff    Mobility  Overal bed mobility: Needs Assistance Bed Mobility: Supine to Sit, Sit to Supine  Supine to sit: Min assist Sit to supine: Mod assist General bed mobility comments: Received sitting in  chair upon PT arrival.    Transfers  Overall transfer level: Needs assistance Equipment used: Rolling walker (2 wheeled) Transfers: Sit to/from Stand Sit to Stand: Min assist Stand pivot transfers: +2 physical assistance, +2 safety/equipment, Min assist General transfer comment: Min A to rise from chair. Stood from chair x4.     Ambulation / Gait / Stairs / Wheelchair Mobility  Ambulation/Gait Ambulation/Gait assistance: Min guard, +2 safety/equipment Ambulation Distance (Feet): 125 Feet (x2 bouts) Assistive device: Rolling walker (2 wheeled) Gait Pattern/deviations: Step-through pattern, Wide base of support, Trunk flexed, Decreased step length - left, Decreased step length - right Gait velocity interpretation: Below normal speed for age/gender General Gait Details: VC's for upright posture and RW proximity. Dyspnea and fatigue present requiring 1 seated rest break. Sa02 88% on 3L 02 [place]. Cues for pursed lip breathing.     Posture / Balance Dynamic Sitting Balance Sitting balance - Comments: Trunk musculature fatigues quickly requiring need for back support.  Balance Overall balance assessment: Needs assistance Sitting-balance support: Feet supported, No upper extremity supported Sitting balance-Leahy Scale: Fair Sitting balance - Comments: Trunk musculature fatigues quickly requiring need for back support.  Standing balance support: During functional activity Standing balance-Leahy Scale: Poor Standing balance comment: walker and min assist for static standing.    Special needs/care consideration BiPAP/CPAP pt uses at home but has not use after initial ICU stay due to difficuoty wearing when he can't lie on his side as he does at home.  Bowel mgmt: loose BMs due to CDiff positive and tube feeds. Likely also his mestinon Bladder mgmt: urinal O2 at 2 liters nasal cannula Yonker suctions self orally Diabetic mgmt yes THN, Atika, met with pt and wife 12/12/14 and plans  to follow up after d/c. [phone]   Previous Home Environment Living Arrangements: Spouse/significant other Lives With: Spouse Available Help at Discharge: Family, Available 24 hours/day Type of Home: House Home Layout: One level Home Access: Stairs to enter Entrance Stairs-Rails: Right Entrance Stairs-Number of Steps: 3 Bathroom Shower/Tub: Chiropodist: Standard Bathroom Accessibility: Yes How Accessible: Accessible via walker [place]: No Additional Comments: wife reports that if he goes home, they will probably need tub bench  Discharge Living Setting Plans for Discharge Living Setting: Patient's home, Lives with (comment), Other (Comment) (wife) Type of Home at Discharge: House Discharge Home Layout: One level Discharge Home Access: Stairs to enter Entrance Stairs-Rails: Right Entrance Stairs-Number of Steps: 3 Discharge Bathroom Shower/Tub: Tub/shower unit Discharge Bathroom Toilet: Standard Discharge Bathroom Accessibility: Yes How Accessible: Accessible via walker Does the patient have any problems obtaining your medications?: No  Social/Family/Support Systems Patient Roles: Spouse, Parent Contact Information: Luster Landsberg, wife Anticipated Caregiver: wife Anticipated Caregiver's Contact Information: see above Ability/Limitations of Caregiver: no limitations Caregiver Availability: 24/7 Discharge Plan Discussed with Primary Caregiver: Yes Is Caregiver In Agreement with Plan?: Yes Does Caregiver/Family have Issues with Lodging/Transportation while Pt is in Rehab?: No  Goals/Additional Needs Patient/Family Goal for Rehab: Mod I to supervision with PT and OT, supervision to min assist SLP Expected length of stay: ELOS 16-22 days Dietary Needs: NPO with panda feeds at present Pt/Family Agrees to Admission and willing to participate: Yes Program Orientation Provided & Reviewed with Pt/Caregiver Including Roles & Responsibilities:  Yes  Decrease burden of Care through IP rehab admission: n/a  Possible need for SNF placement upon discharge:not anticipated  Patient Condition:  This patient's medical and functional status has changed since the consult dated: 1'27/2016 in which the Rehabilitation Physician determined and documented that the patient's condition is appropriate for intensive rehabilitative care in an inpatient rehabilitation facility. See "History of Present Illness" (above) for medical update. Functional changes are: overall min to mod assist with PT and OT. Totoal assist swallowing with SLP. NPO with panda feeds. Patient's medical and functional status update has been discussed with the Rehabilitation physician and patient remains appropriate for inpatient rehabilitation. Will admit to inpatient rehab today.  Preadmission Screen Completed By: Cleatrice Burke, 12/17/2014 11:30 AM ______________________________________________________________________  Discussed status with Dr. Naaman Plummer on 12/17/2014 at 1129 and received telephone approval for admission today.  Admission Coordinator: Cleatrice Burke, time 1188 Date 12/17/2014.          Cosigned by: Meredith Staggers, MD at 12/17/2014 12:08 PM  Revision History     Date/Time User Provider Type Action   12/17/2014 12:08 PM Meredith Staggers, MD Physician Cosign   12/17/2014 11:47 AM Cleatrice Burke, RN Rehab Admission Coordinator Sign   12/17/2014 11:32 AM Cleatrice Burke, RN Rehab Admission Coordinator Sign   View Details Report

## 2014-12-17 NOTE — Progress Notes (Signed)
Pt. Performed 1L on the vital capacity and -37 on the NIF.

## 2014-12-17 NOTE — H&P (Signed)
Expand All Collapse All      Physical Medicine and Rehabilitation Admission H&P  CC: MG crises.   HPI: Craig [name] is a 74 y.o. male with history of A fib, metastatic bladder cancer, recent PNA, MG who has had worsening over past months. He was evaluated by neurology 3 days PTA and was placed on steroids with increase in mestinon due to difficulty swallowing and SOB. Patient continued to worsen and was admitted via [place] on 12/05/14 with SOB, swallowing difficulty, difficulty hold head up with droopy eyelids and congestion. s. He was intubated due to respiratory distress and started on IV solumedrol as well as plasmapheresis per input by Dr. Doy Mince. He has responded to treatment and tolerated extubation on 01/25 with use of BIPAP prn. He is able to suction oral secretions and is currently NPO with tube feeds for nutritional support. He was treated with PLEX X 6 days---last on 12/15/14 with reports of improvement in SOB. Neurology recommends IVIG X 3 if patient declines or does not show further improvement.   ST has been working of trials of ice chips. NIF/VC not checked in past couple of days as equipment unavailable. He was started on flagyl for c diff colitis with diarrhea and completes treatment on 02/10. Reactive leucocytosis is resolving. Hypernatremia and hypokalemia resolving with treatment and Xarelto resumed. Therapy ongoing with recommendations for CIR. Patient felt to have completed his therapy for MG crises and cleared for comprehensive inpatient rehab program.    Review of Systems  HENT: Negative for hearing loss.  Eyes: Negative for blurred vision and double vision.  Respiratory: Positive for cough. Negative for sputum production, shortness of breath and wheezing.  Cardiovascular: Negative for chest pain and palpitations.  Gastrointestinal: Positive for diarrhea. Negative for heartburn and nausea.   Bowel urgency and frequency PTA due to meds.  Genitourinary:  Positive for urgency and frequency.   Incontinent of bladder.  Musculoskeletal: Negative for myalgias.  Neurological: Positive for focal weakness (RUE ). Negative for headaches.      Past Medical History  Diagnosis Date  . Atrial fibrillation, chronic     on Xarelto   . HTN (hypertension)   . CHF (congestive heart failure)   . Diabetes mellitus   . 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 Abdominal wall metastasis removal      History reviewed. No pertinent family history.    Social History: Married. Independent PTA--completed outpatient PT last fall. He does not use tobacco, alcohol or illicit drugs.    Allergies: No Known Allergies    Medications Prior to Admission  Medication Sig Dispense Refill  . acetaminophen (TYLENOL) 650 MG CR tablet Take 650 mg by mouth 2 (two) times daily as needed for pain.    Marland Kitchen amiodarone (PACERONE) 100 MG tablet Take 100 mg by mouth daily.    Marland Kitchen amLODipine (NORVASC) 10 MG tablet Take 10 mg by mouth daily.    . Ascorbic Acid (VITAMIN C) 1000 MG tablet Take 1,000 mg by mouth daily.    . [place]-Glucosamine-Vit D (GLUCOSAMINE COMPLEX PO) Take 1 tablet by mouth daily.    . furosemide (LASIX) 80 MG tablet Take 80 mg by mouth daily.    Marland Kitchen ipratropium (ATROVENT HFA) 17 MCG/ACT inhaler Inhale 1 puff into the lungs every 6 (six) hours as needed for wheezing.    . IRON PO Take 1 tablet by mouth  daily. 54m    . metFORMIN (GLUCOPHAGE) 500 MG tablet Take 500 mg by mouth 2 (two) times daily with a meal.    . metolazone (ZAROXOLYN) 2.5 MG tablet Take 2.5 mg by mouth daily as needed. Only takes if he gains extra 5 pounds to help with the lasix per wife    . Multiple Vitamin (MULTIVITAMIN WITH MINERALS) TABS tablet Take 1 tablet by mouth daily.    .  nateglinide (STARLIX) 120 MG tablet Take 120 mg by mouth 3 (three) times daily with meals.    . Omega-3 Fatty Acids (FISH OIL) 1000 MG CAPS Take 1 capsule by mouth daily.    . pantoprazole (PROTONIX) 40 MG tablet Take 40 mg by mouth daily.    . potassium chloride SA (K-DUR,KLOR-CON) 20 MEQ tablet Take 20 mEq by mouth 3 (three) times daily.    . predniSONE (DELTASONE) 20 MG tablet Take 20 mg by mouth 3 (three) times daily.    .Marland Kitchenpyridostigmine (MESTINON) 60 MG tablet Take 60 mg by mouth 3 (three) times daily.    . rivaroxaban (XARELTO) 20 MG TABS tablet Take 20 mg by mouth daily with supper.    . zinc gluconate 50 MG tablet Take 50 mg by mouth daily.    . [DISCONTINUED] atorvastatin (LIPITOR) 40 MG tablet Take 40 mg by mouth daily.    . [DISCONTINUED] metoprolol tartrate (LOPRESSOR) 25 MG tablet Take 25 mg by mouth daily.      Home: Home Living Family/patient expects to be discharged to:: Private residence Living Arrangements: Spouse/significant other Available Help at Discharge: Family, Available 24 hours/day Type of Home: House Home Access: Stairs to enter ETechnical brewerof Steps: 3 Entrance Stairs-Rails: Right Home Layout: One lCedar WEnvironmental consultant- 2 wheels, Cane - single point, Bedside commode Additional Comments: wife reports that if he goes home, they will probably need tub bench Lives With: Spouse  Functional History: Prior Function Level of Independence: Independent with assistive device(s) Comments: ambulated with cane, bathed and dressed self, fixed easy meals, wife drives  Functional Status:  Mobility: Bed Mobility Overal bed mobility: Needs Assistance Bed Mobility: Supine to Sit, Sit to Supine Supine to sit: Min assist Sit to supine: Mod assist General bed mobility comments: Received sitting in chair upon PT arrival. Transfers Overall transfer level: Needs assistance Equipment used: Rolling walker  (2 wheeled) Transfers: Sit to/from Stand Sit to Stand: Min assist Stand pivot transfers: +2 physical assistance, +2 safety/equipment, Min assist General transfer comment: Min A to rise from chair.  Ambulation/Gait Ambulation/Gait assistance: Min guard, +2 safety/equipment Ambulation Distance (Feet): 300 Feet Assistive device: Rolling walker (2 wheeled) Gait Pattern/deviations: Step-through pattern, Decreased stance time - left, Decreased step length - right, Decreased step length - left, Trunk flexed, Wide base of support Gait velocity interpretation: at or above normal speed for age/gender General Gait Details: Vc's for upright posture. Pt. stated he could hold his neck up.     ADL: ADL Overall ADL's : Needs assistance/impaired Eating/Feeding: NPO Grooming: Wash/dry face, Brushing hair, Oral care, Sitting, Minimal assistance Upper Body Bathing: Maximal assistance, Sitting Lower Body Bathing: Maximal assistance, Sit to/from stand Upper Body Dressing : Maximal assistance, Sitting Lower Body Dressing: Maximal assistance, Sit to/from stand General ADL Comments: Pt very fatigued today - reports he had a procedure done in middle of night and did not sleep. He was initially agreeable to working with OT, but was unable to maintain arousal while performing UE exercises. Encouraged pt to attempt OOB  later today with nursing staff  Cognition: Cognition Overall Cognitive Status: Within Functional Limits for tasks assessed Orientation Level: Oriented X4 Cognition Arousal/Alertness: Lethargic Behavior During Therapy: Flat affect Overall Cognitive Status: Within Functional Limits for tasks assessed  Physical Exam: Blood pressure 117/57, pulse 77, temperature 98.2 F (36.8 C), temperature source Oral, resp. rate 22, height 5' 7"  (1.702 m), weight 107.4 kg (236 lb 12.4 oz), SpO2 93 %. Physical Exam Nursing note and vitals reviewed. Constitutional: He appears well-developed and  well-nourished.  Fatigued. Panda in place left nare.  HENT: secretions which he attempts to cough up--has problems when they enter the oral cavity as far as secreting them is concerned. Head: Normocephalic and atraumatic.  Eyes: Left conjunctiva is injected.  Right ptosis improving---lids more symmetrical  Cardiovascular: Normal rate and regular rhythm.  Respiratory: Effort normal. No respiratory distress. He has no wheezes. He has rhonchi.  GI: Soft. Bowel sounds are normal. He exhibits no distension. There is no tenderness.  Musculoskeletal: He exhibits no edema or tenderness.  Neurological: He is alert.  Facial diplegia. Poor oral-motor control but improving. Articulation and phonation better. Ptosis bilaterally, left more than right. Followed motor commands without difficulty. Reasonable insight and awareness. Sensory exam intact to PP and LT. Strength 4/5 UE's bilaterally prox to distal. LE's 3+/5 hf, 4/5ke, 4 ankles.  Skin: Skin is warm and dry.  Psychiatric: He has a normal mood and affect. His behavior is normal. Good insight and awareness.     Lab Results Last 48 Hours    Results for orders placed or performed during the hospital encounter of 12/05/14 (from the past 48 hour(s))  Glucose, capillary Status: Abnormal   Collection Time: 12/15/14 5:13 PM  Result Value Ref Range   Glucose-Capillary 261 (H) 70 - 99 mg/dL  Glucose, capillary Status: Abnormal   Collection Time: 12/15/14 7:51 PM  Result Value Ref Range   Glucose-Capillary 180 (H) 70 - 99 mg/dL   Comment 1 Notify RN   Glucose, capillary Status: Abnormal   Collection Time: 12/15/14 11:26 PM  Result Value Ref Range   Glucose-Capillary 139 (H) 70 - 99 mg/dL   Comment 1 Notify RN   I-STAT, chem 8 Status: Abnormal   Collection Time: 12/16/14 12:41 AM  Result Value Ref Range   Sodium 146 (H) 135 - 145 mmol/L   Potassium 3.0 (L) 3.5 - 5.1  mmol/L   Chloride 102 96 - 112 mmol/L   BUN 18 6 - 23 mg/dL   Creatinine, Ser 0.70 0.50 - 1.35 mg/dL   Glucose, Bld 134 (H) 70 - 99 mg/dL   Calcium, Ion 1.13 1.13 - 1.30 mmol/L   TCO2 24 0 - 100 mmol/L   Hemoglobin 14.3 13.0 - 17.0 g/dL   HCT 42.0 39.0 - 52.0 %  Glucose, capillary Status: Abnormal   Collection Time: 12/16/14 3:42 AM  Result Value Ref Range   Glucose-Capillary 130 (H) 70 - 99 mg/dL   Comment 1 Notify RN   CBC Status: Abnormal   Collection Time: 12/16/14 5:10 AM  Result Value Ref Range   WBC 18.5 (H) 4.0 - 10.5 K/uL   RBC 4.17 (L) 4.22 - 5.81 MIL/uL   Hemoglobin 13.1 13.0 - 17.0 g/dL   HCT 38.8 (L) 39.0 - 52.0 %   MCV 93.0 78.0 - 100.0 fL   MCH 31.4 26.0 - 34.0 pg   MCHC 33.8 30.0 - 36.0 g/dL   RDW 13.7 11.5 - 15.5 %   Platelets 128 (L) 150 -  400 K/uL  Basic metabolic panel Status: Abnormal   Collection Time: 12/16/14 5:10 AM  Result Value Ref Range   Sodium 144 135 - 145 mmol/L   Potassium 3.2 (L) 3.5 - 5.1 mmol/L   Chloride 111 96 - 112 mmol/L    Comment: DELTA CHECK NOTED   CO2 29 19 - 32 mmol/L   Glucose, Bld 150 (H) 70 - 99 mg/dL   BUN 13 6 - 23 mg/dL   Creatinine, Ser 0.61 0.50 - 1.35 mg/dL   Calcium 8.6 8.4 - 10.5 mg/dL   GFR calc non Af Amer >90 >90 mL/min   GFR calc Af Amer >90 >90 mL/min    Comment: (NOTE) The eGFR has been calculated using the CKD EPI equation. This calculation has not been validated in all clinical situations. eGFR's persistently <90 mL/min signify possible Chronic Kidney Disease.    Anion gap 4 (L) 5 - 15  Glucose, capillary Status: Abnormal   Collection Time: 12/16/14 7:44 AM  Result Value Ref Range   Glucose-Capillary 137 (H) 70 - 99 mg/dL  Glucose, capillary Status: Abnormal   Collection Time: 12/16/14 12:15 PM  Result Value Ref  Range   Glucose-Capillary 172 (H) 70 - 99 mg/dL  Glucose, capillary Status: Abnormal   Collection Time: 12/16/14 4:22 PM  Result Value Ref Range   Glucose-Capillary 256 (H) 70 - 99 mg/dL  Glucose, capillary Status: Abnormal   Collection Time: 12/16/14 8:14 PM  Result Value Ref Range   Glucose-Capillary 174 (H) 70 - 99 mg/dL  Glucose, capillary Status: Abnormal   Collection Time: 12/17/14 12:27 AM  Result Value Ref Range   Glucose-Capillary 129 (H) 70 - 99 mg/dL  Glucose, capillary Status: Abnormal   Collection Time: 12/17/14 4:12 AM  Result Value Ref Range   Glucose-Capillary 132 (H) 70 - 99 mg/dL  Glucose, capillary Status: Abnormal   Collection Time: 12/17/14 7:47 AM  Result Value Ref Range   Glucose-Capillary 168 (H) 70 - 99 mg/dL  Glucose, capillary Status: Abnormal   Collection Time: 12/17/14 12:04 PM  Result Value Ref Range   Glucose-Capillary 160 (H) 70 - 99 mg/dL      Imaging Results (Last 48 hours)    No results found.       Medical Problem List and Plan: 1. Functional deficits secondary to MG crises 2. DVT Prophylaxis/Anticoagulation: Pharmaceutical: Xarelto 3. Pain Management: Tylenol prn 4. Mood: Team to provide ego support. LCSW to follow for evaluation and support.  5. Neuropsych: This patient is capable of making decisions on her own behalf. 6. Skin/Wound Care: routine pressure relief measures. Maintain adequate nutrition and hydration status.  7. Fluids/Electrolytes/Nutrition: Continue TF. Will add questran for bulking. Continue water flushes every 4 hours. Will check lytes in am.  8. C diff colitis: On flagyl D# 6/14. Will add probiotics/fiber.  Change formula? 9. A fib: Monitor heart rate every 8 hours. Continue amiodarone and Xarelto.  10. Diarrhea: Exacerbated by tube feeds and c diff. Will add [place] daily. May need to change tube feeds to one with  fiber. Will consult RD for input.    11. Hypokalemia: Add supplement. Recheck levels in am.  12. MG crises: On prednisone 60 mg/daily and mestinon 90 mg qid (increased few days PTA). Monitor for recovery past PLEX.  13. Continued oro-pharyngeal dysphagia---NGT for now -I am hopeful that with improving neuro status in other bulbar muscles, we'll see the same here -may have to consider G-tube however   Post Admission Physician Evaluation:  1. Functional deficits secondary to MG crises. 2. Patient is admitted to receive collaborative, interdisciplinary care between the physiatrist, rehab nursing staff, and therapy team. 3. Patient's level of medical complexity and substantial therapy needs in context of that medical necessity cannot be provided at a lesser intensity of care such as a SNF. 4. Patient has experienced substantial functional loss from his/her baseline which was documented above under the "Functional History" and "Functional Status" headings. Judging by the patient's diagnosis, physical exam, and functional history, the patient has potential for functional progress which will result in measurable gains while on inpatient rehab. These gains will be of substantial and practical use upon discharge in facilitating mobility and self-care at the household level. 5. Physiatrist will provide 24 hour management of medical needs as well as oversight of the therapy plan/treatment and provide guidance as appropriate regarding the interaction of the two. 6. 24 hour rehab nursing will assist with bladder management, bowel management, safety, skin/wound care, disease management, medication administration, pain management and patient education and help integrate therapy concepts, techniques,education, etc. 7. PT will assess and treat for/with: Lower extremity strength, range of motion, stamina, balance, functional mobility, safety, adaptive techniques and equipment, NMR, family ed, community  reintegration. Goals are: mod I. 8. OT will assess and treat for/with: ADL's, functional mobility, safety, upper extremity strength, adaptive techniques and equipment, NMR, ego support, leisure skills, family ed. Goals are: mod I to supervision. Therapy may not yet proceed with showering this patient. 9. SLP will assess and treat for/with: communication, speech, swallowing. Goals are: mod I to min assist. 10. Case Management and Social Worker will assess and treat for psychological issues and discharge planning. 11. Team conference will be held weekly to assess progress toward goals and to determine barriers to discharge. 12. Patient will receive at least 3 hours of therapy per day at least 5 days per week. 13. ELOS: 8-13 days  14. Prognosis: excellent     Meredith Staggers, MD, [facility] Physical Medicine & Rehabilitation 12/17/2014

## 2014-12-17 NOTE — Progress Notes (Signed)
Physical Therapy Treatment Patient Details Name: Cy Bresee MRN: [id number] DOB: [birth year] Today's Date: 12/17/2014    History of Present Illness Wilbon Obenchain is a 74 y.o. male with a past medical history of myasthenia gravis, history metastatic bladder cancer to abdominal wall, status post chemotherapy, history of atrial fibrillation chronic anticoagulation, presenting as a transfer from [facility] on 12/05/14. Patient having progressive weakness over the past month becoming worse in past week. Pt with acute MG exacerbation & respiratory failure. Decompensated requiring intubation 1/22 pm, extubated 12/08/14, currently on bipap PRN. Pt now with C-diff.    PT Comments    Pt. Has postural deficits i.e trunk flexed and neck flexed due to weakness in musculature. Needs RW for balance in static standing and functional activity. Relies on O2 during ambulation to avoid shortness of breath. Would be a good candidate for SNF for ongoing therapy needs upon d/c. Pt. Would benefit from balance and strengthening for generalized weakness.   Follow Up Recommendations  CIR     Equipment Recommendations  Other (comment)    Recommendations for Other Services OT consult;Rehab consult     Precautions / Restrictions Precautions Precautions: Fall Restrictions Weight Bearing Restrictions: No    Mobility  Bed Mobility               General bed mobility comments: Received sitting in chair upon PT arrival.  Transfers Overall transfer level: Needs assistance Equipment used: Rolling walker (2 wheeled) Transfers: Sit to/from Stand Sit to Stand: Min assist Stand pivot transfers: +2 physical assistance;+2 safety/equipment;Min assist       General transfer comment: Min A to rise from chair.   Ambulation/Gait Ambulation/Gait assistance: Min guard;+2 safety/equipment Ambulation Distance (Feet): 300 Feet Assistive device: Rolling walker (2 wheeled) Gait Pattern/deviations: Step-through  pattern;Decreased stance time - left;Decreased step length - right;Decreased step length - left;Trunk flexed;Wide base of support   Gait velocity interpretation: at or above normal speed for age/gender General Gait Details: Vc's for upright posture. Pt. stated he could hold his neck up.    Stairs            Wheelchair Mobility    Modified Rankin (Stroke Patients Only)       Balance Overall balance assessment: Needs assistance         Standing balance support: During functional activity;Bilateral upper extremity supported Standing balance-Leahy Scale: Poor Standing balance comment: walker and min a for static standing                    Cognition Arousal/Alertness: Lethargic Behavior During Therapy: Flat affect Overall Cognitive Status: Within Functional Limits for tasks assessed                      Exercises      General Comments        Pertinent Vitals/Pain Pain Assessment: No/denies pain    Home Living                      Prior Function            PT Goals (current goals can now be found in the care plan section) Progress towards PT goals: Progressing toward goals    Frequency  Min 3X/week    PT Plan Current plan remains appropriate    Co-evaluation             End of Session Equipment Utilized During Treatment: Gait belt;Oxygen Activity Tolerance: Patient tolerated treatment  well Patient left: in chair;with call bell/phone within reach;with family/visitor present     Time: 1130-1155 PT Time Calculation (min) (ACUTE ONLY): 25 min  Charges:                       G Codes:      Jodi Geralds, [place] 12/17/2014, 12:20 PM

## 2014-12-17 NOTE — Progress Notes (Signed)
NEURO HOSPITALIST PROGRESS NOTE   SUBJECTIVE:                                                                                                                        Out of bed. Wife at the bedside. Stated that he is getting stronger but can not close his mouth completely and tires easily. Received an extra cycle of PLEX yesterday for a total of 6 days. Unable to obtain NIF/VC, equipment unavailable. Tolerating well mestinon 90 mg 4 times daily. Prednisone 60 mg qd. OBJECTIVE:                                                                                                                           Vital signs in last 24 hours: Temp:  [98.1 F (36.7 C)-99.7 F (37.6 C)] 98.2 F (36.8 C) (02/03 0410) Pulse Rate:  [77] 77 (02/03 0410) Resp:  [22-24] 22 (02/03 0410) BP: (109-117)/(56-69) 117/57 mmHg (02/03 0410) SpO2:  [93 %-97 %] 93 % (02/03 0410) Weight:  [107.4 kg (236 lb 12.4 oz)] 107.4 kg (236 lb 12.4 oz) (02/03 0410)  Intake/Output from previous day: 02/02 0701 - 02/03 0700 In: 1001.3 [NG/GT:1001.3] Out: 850 [Urine:850] Intake/Output this shift: Total I/O In: -  Out: 400 [Urine:400] Nutritional status:    Past Medical History  Diagnosis Date  . Atrial fibrillation, chronic     on Xarelto   . HTN (hypertension)   . CHF (congestive heart failure)   . Diabetes mellitus   . 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 Head: normocephalic. Neck: supple, no bruits, no JVD. Cardiac: no murmurs. Lungs: clear. Abdomen: soft, no tender, no mass. Extremities: no edema. Skin: no rash  Neurologic Exam:  General: Mental Status: Alert, oriented, thought content appropriate.Dysarthria. Able to follow 3 step commands without difficulty. Cranial Nerves: II: Discs flat bilaterally; Visual fields grossly normal, pupils equal, round, reactive  to light and accommodation III,IV, VI: right Eyelid ptosis, EOM impaired right eye. V,VII: smile symmetric, facial light touch sensation normal bilaterally VIII: hearing normal bilaterally IX,X: gag reflex present XI: bilateral shoulder shrug XII: midline tongue extension without atrophy or  fasciculations Motor: Neck flexors 3/5. Can not open his mouth completely.  Biceps 3/5, deltoids and triceps 4/5. Marland KitchenGood strength in his legs.  Sensory: intact to LT Deep Tendon Reflexes:  1+ ALL OVER Plantars: Right: downgoingLeft: downgoing Cerebellar: normal finger-to-nose, heel-to-shin no tested Gait:  Unable to test  Lab Results: No results found for: CHOL Lipid Panel No results for input(s): CHOL, TRIG, HDL, CHOLHDL, VLDL, LDLCALC in the last 72 hours.  Studies/Results: No results found.  MEDICATIONS                                                                                                                        Scheduled: . amiodarone  100 mg Per Tube Daily  . amLODipine  5 mg Per Tube Daily  . antiseptic oral rinse  7 mL Mouth Rinse QID  . chlorhexidine  15 mL Mouth Rinse BID  . famotidine  20 mg Per Tube Daily  . free water  200 mL Per Tube Q4H  . insulin aspart  0-20 Units Subcutaneous 6 times per day  . metroNIDAZOLE  500 mg Oral 3 times per day  . predniSONE  60 mg Oral Q breakfast  . pyridostigmine  90 mg Per NG tube QID  . rivaroxaban  20 mg Oral Q supper  . sodium chloride  10-40 mL Intracatheter Q12H    ASSESSMENT/PLAN:                                                                                                           74 y/o with MG exacerbation s/p 6 days PLEX. Some improvement. Had his last PLEX yesterday, and thus will allow more time to decide whether or not a cycle of IVIG x 3 days is warranted.. Patient is safe to go to rehab at this time, but neurology should be notify in case of patient decline or lack of further  clinical improvement in which case will treat with IVIG. Continue mestinon and prednisone.  Dorian Pod, MD [facility] [phone]  12/17/2014, 10:09 AM

## 2014-12-17 NOTE — Progress Notes (Signed)
I met with pt and his wife at bedside. I have insurance approval and will admit pt to inpt rehab today. [phone]

## 2014-12-17 NOTE — Progress Notes (Signed)
Patient arrived onto 4 MW in bed assisted by nurse tech, and wife at the bedside. Discuss information packet with patient and patient's wife verbalized understanding of safety plan, and agreement. Patient verbalized understanding, will continue to monitor.

## 2014-12-18 ENCOUNTER — Inpatient Hospital Stay (HOSPITAL_COMMUNITY): Payer: PPO | Admitting: Speech Pathology

## 2014-12-18 ENCOUNTER — Inpatient Hospital Stay (HOSPITAL_COMMUNITY): Payer: PPO | Admitting: Occupational Therapy

## 2014-12-18 ENCOUNTER — Inpatient Hospital Stay (HOSPITAL_COMMUNITY): Payer: PPO

## 2014-12-18 LAB — CBC WITH DIFFERENTIAL/PLATELET
BASOS ABS: 0 10*3/uL (ref 0.0–0.1)
BASOS PCT: 0 % (ref 0–1)
Eosinophils Absolute: 0.1 10*3/uL (ref 0.0–0.7)
Eosinophils Relative: 0 % (ref 0–5)
HEMATOCRIT: 38.2 % — AB (ref 39.0–52.0)
Hemoglobin: 13.1 g/dL (ref 13.0–17.0)
LYMPHS PCT: 11 % — AB (ref 12–46)
Lymphs Abs: 2.1 10*3/uL (ref 0.7–4.0)
MCH: 31.7 pg (ref 26.0–34.0)
MCHC: 34.3 g/dL (ref 30.0–36.0)
MCV: 92.5 fL (ref 78.0–100.0)
MONO ABS: 1.2 10*3/uL — AB (ref 0.1–1.0)
Monocytes Relative: 6 % (ref 3–12)
NEUTROS PCT: 83 % — AB (ref 43–77)
Neutro Abs: 16 10*3/uL — ABNORMAL HIGH (ref 1.7–7.7)
Platelets: 175 10*3/uL (ref 150–400)
RBC: 4.13 MIL/uL — ABNORMAL LOW (ref 4.22–5.81)
RDW: 13.7 % (ref 11.5–15.5)
WBC: 19.3 10*3/uL — ABNORMAL HIGH (ref 4.0–10.5)

## 2014-12-18 LAB — COMPREHENSIVE METABOLIC PANEL
ALBUMIN: 3.5 g/dL (ref 3.5–5.2)
ALK PHOS: 36 U/L — AB (ref 39–117)
ALT: 33 U/L (ref 0–53)
ANION GAP: 5 (ref 5–15)
AST: 32 U/L (ref 0–37)
BILIRUBIN TOTAL: 0.8 mg/dL (ref 0.3–1.2)
BUN: 13 mg/dL (ref 6–23)
CO2: 34 mmol/L — AB (ref 19–32)
CREATININE: 0.55 mg/dL (ref 0.50–1.35)
Calcium: 9 mg/dL (ref 8.4–10.5)
Chloride: 102 mmol/L (ref 96–112)
GFR calc non Af Amer: >90 mL/min (ref >90)
Glucose, Bld: 132 mg/dL — ABNORMAL HIGH (ref 70–99)
Potassium: 3.4 mmol/L — ABNORMAL LOW (ref 3.5–5.1)
Sodium: 141 mmol/L (ref 135–145)
TOTAL PROTEIN: 4.4 g/dL — AB (ref 6.0–8.3)

## 2014-12-18 LAB — GLUCOSE, CAPILLARY
GLUCOSE-CAPILLARY: 258 mg/dL — AB (ref 70–99)
Glucose-Capillary: 114 mg/dL — ABNORMAL HIGH (ref 70–99)
Glucose-Capillary: 136 mg/dL — ABNORMAL HIGH (ref 70–99)
Glucose-Capillary: 139 mg/dL — ABNORMAL HIGH (ref 70–99)
Glucose-Capillary: 175 mg/dL — ABNORMAL HIGH (ref 70–99)
Glucose-Capillary: 232 mg/dL — ABNORMAL HIGH (ref 70–99)

## 2014-12-18 MED ORDER — JEVITY 1.2 CAL PO LIQD
1000.0000 mL | ORAL | Status: DC
  Administered 2014-12-18 – 2014-12-25 (×10): 1000 mL
  Filled 2014-12-18 (×18): qty 1000

## 2014-12-18 MED ORDER — PRO-STAT SUGAR FREE PO LIQD
30.0000 mL | Freq: Every day | ORAL | Status: AC
  Administered 2014-12-18: 30 mL
  Filled 2014-12-18: qty 30

## 2014-12-18 MED ORDER — METRONIDAZOLE 50 MG/ML ORAL SUSPENSION
500.0000 mg | Freq: Three times a day (TID) | ORAL | Status: AC
  Administered 2014-12-18 – 2014-12-25 (×24): 500 mg
  Filled 2014-12-18 (×28): qty 10

## 2014-12-18 NOTE — Evaluation (Signed)
Physical Therapy Assessment and Plan  Patient Details  Name: Craig [name] MRN: [id number] Date of Birth: [birth year]  PT Diagnosis: Abnormal posture, Abnormality of gait, Pain, Difficulty walking and Muscle weakness Rehab Potential: Good ELOS: 10-12 days   Today's Date: 12/18/2014 PT Individual Time: 1000-1100 PT Individual Time Calculation (min): 60 min    Problem List:  Patient Active Problem List   Diagnosis Date Noted  . MG, crisis (myasthenia gravis) 12/17/2014  . Dysphagia, pharyngoesophageal phase   . Enteritis due to Clostridium difficile   . Clostridium difficile diarrhea 12/15/2014  . Hypernatremia   . Acute exacerbation of myasthenia gravis   . Myasthenia gravis in crisis 12/05/2014  . Bladder cancer 12/05/2014  . HTN (hypertension) 12/05/2014  . A-fib 12/05/2014  . Chronic anticoagulation 12/05/2014  . Acute respiratory failure with hypoxia 12/05/2014    Past Medical History:  Past Medical History  Diagnosis Date  . Atrial fibrillation, chronic     on Xarelto   . HTN (hypertension)   . CHF (congestive heart failure)   . Diabetes mellitus   . 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 Abdominal wall metastasis removal      Assessment & Plan Clinical Impression: Patient is a 74 y.o. year old male with recent admission to the hospital with history of A fib, metastatic bladder cancer, recent PNA, MG who has had worsening over past months. He was evaluated by neurology 3 days PTA and was placed on steroids with increase in mestinon due to difficulty swallowing and SOB. Patient continued to worsen and was admitted via [place] on 12/05/14 with SOB, swallowing difficulty, difficulty hold head up with droopy eyelids and congestion. s. He was intubated due to respiratory distress and started on IV solumedrol as well as plasmapheresis per input by Dr.  Doy Mince. He has responded to treatment and tolerated extubation on 01/25 with use of BIPAP prn. He is able to suction oral secretions and is currently NPO with tube feeds for nutritional support. He was treated with PLEX X 6 days---last on 12/15/14 with reports of improvement in SOB. Neurology recommends IVIG X 3 if patient declines or does not show further improvement.   ST has been working of trials of ice chips. NIF/VC not checked in past couple of days as equipment unavailable. He was started on flagyl for c diff colitis with diarrhea and completes treatment on 02/10. Reactive leucocytosis is resolving. Hypernatremia and hypokalemia resolving with treatment and Xarelto resumed. Therapy ongoing with recommendations for CIR. Patient felt to have completed his therapy for MG crises and cleared for comprehensive inpatient rehab program. Patient transferred to [place] on 12/17/2014 .   Patient currently requires min A with transfers, mod A with stairs, and min A +2 (for equipment management) for gait secondary to muscle weakness and muscle joint tightness, decreased cardiorespiratoy endurance and decreased oxygen support and decreased sitting balance, decreased standing balance, decreased postural control and decreased balance strategies.  Prior to hospitalization, patient was modified independent  with mobility and lived with Spouse in a House home.  Home access is 3Stairs to enter.  Patient will benefit from skilled PT intervention to maximize safe functional mobility, minimize fall risk and decrease caregiver burden for planned discharge home with 24 hour supervision.  Anticipate patient will benefit from follow up [place] at discharge.  PT - End of Session Activity Tolerance: Decreased  this session Endurance Deficit: Yes Endurance Deficit Description: becomes SOB with exertion though O2 remained > 90% during activity PT Assessment Rehab Potential (ACUTE/IP ONLY): Good PT Patient demonstrates impairments  in the following area(s): Balance;Edema;Endurance;Motor;Pain;Skin Integrity PT Transfers Functional Problem(s): Bed Mobility;Bed to Chair;Car;Furniture PT Locomotion Functional Problem(s): Ambulation;Stairs PT Plan PT Intensity: Minimum of 1-2 x/day ,45 to 90 minutes PT Frequency: 5 out of 7 days PT Duration Estimated Length of Stay: 10-12 days PT Treatment/Interventions: Ambulation/gait training;Balance/vestibular training;Community reintegration;Discharge planning;Disease management/prevention;DME/adaptive equipment instruction;Functional mobility training;Neuromuscular re-education;Pain management;Patient/family education;Psychosocial support;Skin care/wound management;Splinting/orthotics;Stair training;Therapeutic Activities;Therapeutic Exercise;UE/LE Strength taining/ROM;UE/LE Coordination activities;Wheelchair propulsion/positioning PT Transfers Anticipated Outcome(s): mod I basic; S car PT Locomotion Anticipated Outcome(s): S gait (S due to possibility of needing O2 at d/c) PT Recommendation Follow Up Recommendations: Home health PT Patient destination: Home Equipment Recommended: To be determined Equipment Details: Pt [facility] has [facility]  Skilled Therapeutic Intervention Individual treatment initiated with focus on overall activity tolerance, oriented to rehab unit and goals for PT POC, and addressed functional transfers, gait, and stair negotiation. Pt requires overall min A for mobility at this time except a second person is needed for gait training due to O2 and pole for tube feeds management. Pt requires cueing throughout session to hold head up and extend at trunk especially during gait or standing. Stair negotiation practiced with bilateral rails and with 1 rail to simulate home entry (min A with rails, min to mod A with 1 rail). Transferred to recliner at end of session with BLE elevated for comfort.   PT Evaluation Precautions/Restrictions Precautions Precautions: Fall;Other  (comment) Precaution Comments: 3L O2; NG tube Restrictions Weight Bearing Restrictions: No Other Position/Activity Restrictions: HOB elevated due to continuous tube feeds. Pain Denies pain. C/o discomfort in neck/head due to weakness and keeping it flexed.  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 Used SPC PTA on days he "wasn't feeling great" Cognition Overall Cognitive Status: Within Functional Limits for tasks assessed Arousal/Alertness: Awake/alert Orientation Level: Oriented X4 Safety/Judgment: Appears intact Sensation Sensation Light Touch: Appears Intact Proprioception: Appears Intact Coordination Gross Motor Movements are Fluid and Coordinated: Yes Fine Motor Movements are Fluid and Coordinated: Yes Finger Nose Finger Test: weak Motor  Motor Motor: Abnormal postural alignment and control Motor - Skilled Clinical Observations: bilateral UE weakness right>left, decr strength in head and neck   Locomotion  Ambulation Ambulation/Gait Assistance: 4: Min assist;1: +2 Total assist (+2 for O2 and Tube Feeds pole management)  Trunk/Postural Assessment  Cervical Assessment Cervical Assessment: Exceptions to [facility] Cervical AROM Overall Cervical AROM Comments: decr ability to turn to his left (maybe limited by central line) Cervical Strength Overall Cervical Strength Comments: head flexed forward and difficulty sustaining upright posture/ position  Thoracic Assessment Thoracic Assessment:  Exceptions to [facility] Thoracic Strength Overall Thoracic Strength Comments: flexed posture while seated Lumbar Assessment Lumbar Assessment: Exceptions to [facility] (sits in posterior pelvic tilt) Postural Control Postural Control: Deficits on evaluation Head Control:  remains forward flexed; requires cues to achieve upright position  Balance Balance Balance Assessed: Yes Static Sitting Balance Static Sitting - Level of Assistance: 5: Stand by assistance Dynamic Sitting  Balance Dynamic Sitting - Balance Support: Feet supported;During functional activity Dynamic Sitting - Level of Assistance: 5: Stand by assistance Static Standing Balance Static Standing - Level of Assistance: 4: Min assist Dynamic Standing Balance Dynamic Standing - Level of Assistance: 4: Min assist Extremity Assessment  RUE Assessment RUE Assessment: Exceptions to [facility] RUE AROM (degrees) RUE Overall AROM Comments: shoulder 90 flexion ; elbow and hand WFL RUE Strength RUE Overall Strength Comments: 3-/5  difficulty with sustaining during a functional activity against gravity LUE Assessment LUE Assessment: Exceptions to WFL LUE AROM (degrees) LUE Overall AROM Comments: WFL  LUE Strength LUE Overall Strength Comments: 3+/5 RLE Assessment RLE Assessment: Exceptions to [facility] (dec muscular endurance; grossly 3+ to 4-/5) LLE Assessment LLE Assessment: Exceptions to [facility] (dec muscular endurance; grossly 3+ to 4-/5)  FIM:  FIM - Bed/Chair Transfer Bed/Chair Transfer Assistive Devices: Arm rests Bed/Chair Transfer: 4: Chair or W/C > Bed: Min A (steadying Pt. > 75%) FIM - Locomotion: Wheelchair Locomotion: Wheelchair: 1: Travels less than 50 ft with minimal assistance (Pt.>75%) FIM - Locomotion: Ambulation Locomotion: Ambulation Assistive Devices: Other (comment) (HHA) Ambulation/Gait Assistance: 4: Min assist;1: +2 Total assist (+2 for O2 and Tube Feeds pole management) Locomotion: Ambulation: 1: Two helpers FIM -  Locomotion: Stairs Locomotion: Scientist, physiological: Insurance account manager - 1 Locomotion: Stairs: 1: Up and Down < 4 stairs with moderate assistance (Pt: 50 - 74%)   Refer to Care Plan for Long Term Goals  Recommendations for other services: None  Discharge Criteria: Patient will be discharged from PT if patient refuses treatment 3 consecutive times without medical reason, if treatment goals not met, if there is a change in medical status, if patient makes no progress towards goals or if patient is discharged from hospital.  The above assessment, treatment plan, treatment alternatives and goals were discussed and mutually agreed upon: by patient and by family  Allayne Gitelman 12/18/2014, 12:26 PM

## 2014-12-18 NOTE — Progress Notes (Signed)
[place] PHYSICAL MEDICINE & REHABILITATION     PROGRESS NOTE    Subjective/Complaints: Had some problems breathing last night---mostly secretions, did better after breathing treatment. Would like to resume BIPAP--uses at home. Pt/wife don't know home settings  Objective: Vital Signs: Blood pressure 120/68, pulse 72, temperature 97.7 F (36.5 C), temperature source Oral, resp. rate 17, weight 100.9 kg (222 lb 7.1 oz), SpO2 99 %. No results found.  Recent Labs  12/16/14 0510 12/18/14 0500  WBC 18.5* 19.3*  HGB 13.1 13.1  HCT 38.8* 38.2*  PLT 128* 175    Recent Labs  12/16/14 0510 12/18/14 0500  NA 144 141  K 3.2* 3.4*  CL 111 102  GLUCOSE 150* 132*  BUN 13 13  CREATININE 0.61 0.55  CALCIUM 8.6 9.0   CBG (last 3)   Recent Labs  12/18/14 0006 12/18/14 0357 12/18/14 0817  GLUCAP 114* 136* 139*    Wt Readings from Last 3 Encounters:  12/18/14 100.9 kg (222 lb 7.1 oz)  12/17/14 107.4 kg (236 lb 12.4 oz)  05/03/10 131.543 kg (290 lb)    Physical Exam:  Nursing note and vitals reviewed. Constitutional: He appears well-developed and well-nourished.  Fatigued. Panda in place left nare.  HENT: secretions which he attempts to cough up--has problems when they enter the oral cavity as far as secreting them is concerned. Head: Normocephalic and atraumatic.  Eyes: Left conjunctiva somewhat injected.  Right ptosis improving---lids more symmetrical  Cardiovascular: Normal rate and regular rhythm.  Respiratory: Effort normal. No respiratory distress. He has no wheezes. He has rhonchi.  GI: Soft. Bowel sounds are normal. He exhibits no distension. There is no tenderness.  Musculoskeletal: He exhibits no edema or tenderness.  Neurological: He is alert.  Facial diplegia. Poor oral-motor control--a little more severe today. Articulation and phonation worse than yesterday. Ptosis bilaterally, left more than right. Followed motor commands without difficulty.  Reasonable insight and awareness. Sensory exam intact to PP and LT. Strength 4/5 UE's bilaterally prox to distal. LE's 3+/5 hf, 4/5ke, 4 ankles.  Skin: Skin is warm and dry.  Psychiatric: He has a normal mood and affect. His behavior is normal. Good insight and awareness  Assessment/Plan: 1. 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 Physiatrist is providing close team supervision and 24 hour management of active medical problems listed below. Physiatrist and rehab team continue to assess barriers to discharge/monitor patient progress toward functional and medical goals. FIM:                                   Medical Problem List and Plan: 1. Functional deficits secondary to MG crises 2. DVT Prophylaxis/Anticoagulation: Pharmaceutical: Xarelto 3. Pain Management: Tylenol prn 4. Mood: Team to provide ego support. LCSW to follow for evaluation and support.  5. Neuropsych: This patient is capable of making decisions on her own behalf. 6. Skin/Wound Care: routine pressure relief measures. Maintain adequate nutrition and hydration status.  7. Fluids/Electrolytes/Nutrition: Continue TF. Added questran for bulking. Continue water flushes every 4 hours.    8. C diff colitis: On flagyl D# 7/14. added probiotics/fiber. Change formula? 9. A fib: Monitor heart rate every 8 hours. Continue amiodarone and Xarelto.  10. Diarrhea: Exacerbated by tube feeds and c diff. Will add [place] daily. May need to change tube feeds to one with fiber. Will consult  RD for input.  11. Hypokalemia: Add supplement. Level slightly improved today.  12. MG crises: On prednisone 60 mg/daily and mestinon 90 mg qid (increased few days PTA). Monitor for recovery past PLEX.  13. Continued oro-pharyngeal dysphagia---NGT for now -I am hopeful that with improving neuro status in  other bulbar muscles, we'll see the same here -may have to consider G-tube however 14. Respiratory: uses BIPAP at home. Will order BIPAP with autotitrate pending wife bringing in home settings  -nebs prn  -suction, coughing, IS    LOS (Days) 1 A FACE TO FACE EVALUATION WAS PERFORMED  Andranik Jeune T 12/18/2014 8:26 AM

## 2014-12-18 NOTE — Progress Notes (Signed)
Inpatient [place] Individual Statement of Services  Patient Name:  Craig [name]  Date:  12/18/2014  Welcome to the [facility].  Our goal is to provide you with an individualized program based on your diagnosis and situation, designed to meet your specific needs.  With this comprehensive rehabilitation program, you will be expected to participate in at least 3 hours of rehabilitation therapies Monday-Friday, with modified therapy programming on the weekends.  Your rehabilitation program will include the following services:  Physical Therapy (PT), Occupational Therapy (OT), Speech Therapy (ST), 24 hour per day rehabilitation nursing, Case Management (Social Worker), Rehabilitation Medicine, Nutrition Services and Pharmacy Services  Weekly team conferences will be held on Tuesdays to discuss your progress.  Your Social Worker will talk with you frequently to get your input and to update you on team discussions.  Team conferences with you and your family in attendance may also be held.  Expected length of stay:  10-12 days  Overall anticipated outcome:  Modified Independent with some supervision needed Depending on your progress and recovery, your program may change. Your Social Worker will coordinate services and will keep you informed of any changes. Your Social Worker's name and contact numbers are listed  below.  The following services may also be recommended but are not provided by the [facility] will be made to provide these services after discharge if needed.  Arrangements include referral to agencies that provide these services.  Your insurance has been verified to be:  [employer] Your primary doctor is:  Dr. Ann Held  Pertinent information will be shared with your doctor and your insurance company.  Social  Worker:  Alfonse Alpers, LCSW  [phone] or ([phone]  Information discussed with and copy given to patient by: Trey Sailors, 12/18/2014, 10:33 PM

## 2014-12-18 NOTE — Procedures (Signed)
NIF=(-40) & VC =(0.6L) complete with good effort.

## 2014-12-18 NOTE — Progress Notes (Signed)
Social Work Assessment and Plan  Patient Details  Name: Craig [name] MRN: [id number] Date of Birth: May 25, 1941  Today's Date: 12/18/2014  Problem List:  Patient Active Problem List   Diagnosis Date Noted  . MG, crisis (myasthenia gravis) 12/17/2014  . Dysphagia, pharyngoesophageal phase   . Enteritis due to Clostridium difficile   . Clostridium difficile diarrhea 12/15/2014  . Hypernatremia   . Acute exacerbation of myasthenia gravis   . Myasthenia gravis in crisis 12/05/2014  . Bladder cancer 12/05/2014  . HTN (hypertension) 12/05/2014  . A-fib 12/05/2014  . Chronic anticoagulation 12/05/2014  . Acute respiratory failure with hypoxia 12/05/2014   Past Medical History:  Past Medical History  Diagnosis Date  . Atrial fibrillation, chronic     on Xarelto   . HTN (hypertension)   . CHF (congestive heart failure)   . Diabetes mellitus   . 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 Abdominal wall metastasis removal     Social History:  reports that he has never smoked. He does not have any smokeless tobacco history on file. He reports that he does not drink alcohol or use illicit drugs.  Family / Support Systems Marital Status: Married How Long?: 60 years Patient Roles: Spouse, Parent Spouse/Significant Other: Wing Schoch - wife - [phone] Children: 2 dtrs and 1 son - Kmari Halter - son - [phone] Other Supports: extended family Anticipated Caregiver: wife Ability/Limitations of Caregiver: no limitations Caregiver Availability: 24/7 Family Dynamics: close, supportive wife & family  Social History Preferred language: English Religion:  Read: Yes Write: Yes Employment Status: Retired Date Retired/Disabled/Unemployed: 2006 Age Retired: 63 Public relations account executive Issues: none reported Guardian/Conservator: N/A    Abuse/Neglect Physical Abuse: Denies Verbal Abuse: Denies Sexual Abuse: Denies Exploitation of patient/patient's resources: Denies Self-Neglect: Denies  Emotional Status Pt's affect, behavior and adjustment status: Pt reports feeling well emotionally and mentally and that he is staying positive and has not experienced any signs/symptoms of anxiety/depression. Recent Psychosocial Issues: none reported Psychiatric History: none reported Substance Abuse History: none reported  Patient / Family Perceptions, Expectations & Goals Pt/Family understanding of illness & functional limitations: Pt/wife have a good understanding of pt's condition and do not have any unanswered questions.  Wife has already seen an improvement in pt's condition. Premorbid pt/family roles/activities: Pt/wife have a large yard and like doing yard work together.  They also enjoy spending time with their 4 grandchildren. Anticipated changes in roles/activities/participation: Pt wants to get better so that he can spend time with his grandchildren. Pt/family expectations/goals: Pt wants to go home.  Getting home to his grandchildren is very motivating to him.  Community [employer] Agencies: None Premorbid Home Care/DME Agencies: Other (Comment) (Pt and his wife have used [facility] in the past.  Pt is currently receiving oxygen from  [place] [facility], but need to switch to [place] due to pt's new insurance.) Transportation available at discharge: family  Discharge Planning Living Arrangements: Spouse/significant other Support Systems: Spouse/significant other, Children, Other relatives Type of Residence: Private residence Insurance Resources: Multimedia programmer (specify) ([employer]) Financial Resources: Radio broadcast assistant Screen Referred: No Money Management: Patient, Spouse Does the patient have any problems obtaining your medications?: No Home  Management: Pt's wife is used to managing the inside of the home.  She shared outside chores with pt, but feels this is manageable.  Patient/Family Preliminary Plans: Pt plans to return to his home with his wife. Barriers to Discharge: Steps Social Work Anticipated Follow Up Needs: HH/OP Expected length of stay: 10-12 days  Clinical Impression CSW met with pt and his wife to introduce self and role of CSW, as well as to complete assessment.  Pt and wife were very open and friendly with CSW.  Pt was tired during visit due to a poor night sleep, but he was able to fully participate in assessment.  Pt's wife is very supportive and she plans to assist pt in anyway she needs to at home.  Most of pt's goals are set for mod I with some supervision level goals.  Pt has good family support.  Pt needs to change home oxygen companies and they want a shower aide as recommended by OT.  CSW explained that CSW will help with both things.  Wife is encouraged that pt is already doing better than he was a few days ago.  CSW will continue to follow and assist as needed.  Mekiyah Gladwell, Silvestre Mesi 12/18/2014, 11:11 PM

## 2014-12-18 NOTE — Progress Notes (Signed)
INITIAL NUTRITION ASSESSMENT  DOCUMENTATION CODES Per approved criteria  -Obesity Unspecified   INTERVENTION: D/C Vital AF 1.2  Initiate Jevity 1.2 via NGT @ 25 ml/hr and increase by 70ml every 4 hours to goal rate of 75 ml/20 hr.  Initiate 30 ml ProStat x 1 daily  Tube feeding regimen provides 1900 kcal (100% of needs), 98 grams of protein, and 1215 ml of H20  Continue free water flushes.  Will continue to monitor.  NUTRITION DIAGNOSIS: Inadequate oral intake related to inability to eat as evidenced by NPO status; ongoing  Goal: Pt to meet >/= 90% of estimated energy requirements; ongoing  Monitor:  TF rate/tolerance, weight, labs, I/Os  Reason for Assessment: Consult for assessment of nutrition requirements/needs  74 y.o. male  Admitting Dx: MG, crisis (myasthenia gravis)  ASSESSMENT: 75 y/o male transferred to [facility] on 1/22 with acute MG exacerbation and respiratory failure. Decompensation requiring intubation 1/22 pm. Pt has hx of HTN, CHF, DM2, A. 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 Pt c diff positive. He is able to suction oral secretions and is currently NPO with tube feeds for nutritional support. Pt currently receiving continuous Vital AF 1.2 at 65 mL/hr, 200 mL free water, providing 1872 kcal, 117 grams of protein, and 1264 ml of H20.  Pt reported no nausea, vomiting; stool this morning was loose. Wife was at bedside upon DI assessment. Pt has been tolerating enteral formula well with 0-5 ml residuals; however, will try Jevity 1.2 which provides fiber to help with diarrhea. Talked with pt and wife about wt status; energy needs have not changed, despite 14 lb wt difference from 2/3 to 2/4 (?).   Will follow up on TF tolerance  Labs: hypokalemia, elevated CO2 and glucose  Height: Ht Readings from Last 1 Encounters:  12/05/14 5\' 7"  (1.702 m)    Weight: Wt Readings from Last 1 Encounters:  12/18/14 222 lb 7.1 oz (100.9 kg)     Ideal Body Weight: 148 lbs (67.3 kg)  % Ideal Body Weight: 150%  Wt Readings from Last 10 Encounters:  12/18/14 222 lb 7.1 oz (100.9 kg)  12/17/14 236 lb 12.4 oz (107.4 kg)  05/03/10 290 lb (131.543 kg)    Usual Body Weight: unkown  % Usual Body Weight: -  BMI: 34.9 kg/(m^2) obesity I  Estimated Nutritional Needs: Kcal: 1750-1950 Protein: 90-105 grams Fluid: >1.5 L  Skin: intact  Diet Order: NPO   EDUCATION NEEDS: -No education needs identified at this time   Intake/Output Summary (Last 24 hours) at 12/18/14 1335 Last data filed at 12/18/14 1227  Gross per 24 hour  Intake     10 ml  Output   1025 ml  Net  -1015 ml    Last BM: 2/4 (loose)  Labs:   Recent Labs Lab 12/14/14 0500 12/16/14 0041 12/16/14 0510 12/18/14 0500  NA 146* 146* 144 141  K 3.7 3.0* 3.2* 3.4*  CL 115* 102 111 102  CO2 25  --  29 34*  BUN 29* 18 13 13   CREATININE 0.88 0.70 0.61 0.55  CALCIUM 8.6  --  8.6 9.0  GLUCOSE 201* 134* 150* 132*    CBG (last 3)   Recent Labs  12/18/14 0357 12/18/14 0817 12/18/14 1143  GLUCAP 136* 139* 232*    Scheduled Meds: . amiodarone  100 mg Per Tube Daily  . amLODipine  5 mg Per Tube Daily  . antiseptic oral rinse  7 mL Mouth Rinse QID  . chlorhexidine  15 mL Mouth Rinse BID  . cholestyramine light  4 g Oral Q1200  . famotidine  20 mg Per Tube Daily  . free water  200 mL Per Tube Q4H  . insulin aspart  0-20 Units Subcutaneous 6 times per day  . lactobacillus  1 g Per Tube TID WC  . metroNIDAZOLE  500 mg Per Tube 3 times per day  . polycarbophil  1,250 mg Per Tube BID  . potassium chloride  20 mEq Oral BID  . predniSONE  60 mg Oral Q breakfast  . pyridostigmine  90 mg Per NG tube QID  . rivaroxaban  20 mg Oral Q supper  . saccharomyces boulardii  250 mg Oral BID    Continuous Infusions: . feeding supplement (VITAL AF 1.2 CAL) 1,000 mL (12/18/14 2952)    Past Medical History  Diagnosis Date  . Atrial fibrillation, chronic      on Xarelto   . HTN (hypertension)   . CHF (congestive heart failure)   . Diabetes mellitus   . 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 Abdominal wall metastasis removal      Wynona Dove, MS Dietetic Intern Pager: [phone] --------------------  RD read, reviewed, and agree with dietetic intern's note. Appropriate changes have been made.  Kallie Locks, MS, RD, LDN Pager # [phone] After hours/ weekend pager # [phone]

## 2014-12-18 NOTE — Evaluation (Signed)
Speech Language Pathology Assessment and Plan  Patient Details  Name: Craig [name] MRN: [id number] Date of Birth: 07-Jun-1941  SLP Diagnosis: Dysarthria;Cognitive Impairments  Rehab Potential: Good ELOS: 10-12 days per team, however, dysphagia goals will require an extended amount of time     Today's Date: 12/18/2014 SLP Individual Time: 1430-1530 SLP Individual Time Calculation (min): 60 min   Problem List:  Patient Active Problem List   Diagnosis Date Noted  . MG, crisis (myasthenia gravis) 12/17/2014  . Dysphagia, pharyngoesophageal phase   . Enteritis due to Clostridium difficile   . Clostridium difficile diarrhea 12/15/2014  . Hypernatremia   . Acute exacerbation of myasthenia gravis   . Myasthenia gravis in crisis 12/05/2014  . Bladder cancer 12/05/2014  . HTN (hypertension) 12/05/2014  . A-fib 12/05/2014  . Chronic anticoagulation 12/05/2014  . Acute respiratory failure with hypoxia 12/05/2014   Past Medical History:  Past Medical History  Diagnosis Date  . Atrial fibrillation, chronic     on Xarelto   . HTN (hypertension)   . CHF (congestive heart failure)   . Diabetes mellitus   . 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 Abdominal wall metastasis removal      Assessment / Plan / Recommendation Clinical Impression Patient is a 74 y.o. male with history of A fib, metastatic bladder cancer, recent PNA, MG who with worsening over past months. He was evaluated by neurology 3 days PTA and was placed on steroids with increase in mestinon due to difficulty swallowing and SOB. Patient continued to worsen and was admitted via [name] on 12/05/14 with SOB, swallowing difficulty, difficulty hold head up with droopy eyelids and congestion. He was intubated due to respiratory distress and started on IV solumedrol as well as plasmapheresis per input by Dr.  Doy Mince.  He has responded to treatment and tolerated extubation on 01/25 with use of BIPAP prn. He is able to suction oral secretions and is currently NPO with tube feeds for nutritional support. He was treated with PLEX X 6 days---last on 12/15/14 with reports of improvement in SOB.  Neurology recommends IVIG X 3 if patient declines or does not show further improvement. ST has been working of trials of ice chips. NIF/VC not checked in past couple of days as equipment unavailable. He was started on flagyl for c diff colitis with diarrhea and completes treatment on 02/10. Reactive leucocytosis is resolving.  Hypernatremia and hypokalemia resolving with treatment and  Xarelto resumed.  Therapy ongoing with recommendations for CIR.  Patient felt to have completed his therapy for MG crises and cleared for comprehensive inpatient rehab program. Patient admitted on 12/17/14 and administered a cognitive-linguistic evaluation and BSE. Patient demonstrates generalized oral weakness which impacts his overall speech intelligibility and ability to orally manipulate and transit ice chip trials. The patient allowed ice chip trials to melt and the thin liquid to then slide posteriorly into his pharynx. Patient then demonstrated an immediate cough and was able to orally expectorate secretions. Recommend patient remain NPO and will require an objective test prior to possible diet initiation. Patient would benefit from skilled SLP intervention to maximize his speech intelligibility and swallowing function.    Skilled Therapeutic Interventions          Administered a cognitive-linguistic evaluation and BSE. Please see above for details.   SLP Assessment  Patient will need skilled Speech Lanaguage  Pathology Services during CIR admission    Recommendations  Diet Recommendations: NPO;Alternative means - temporary Medication Administration: Via alternative means Oral Care Recommendations: Oral care Q4 per protocol Patient  destination: Home Follow up Recommendations: Outpatient SLP;Home Health SLP;24 hour supervision/assistance Equipment Recommended: To be determined (May need suction )    SLP Frequency 5 out of 7 days   SLP Treatment/Interventions Environmental controls;Dysphagia/aspiration precaution training;Cueing hierarchy;Functional tasks;Internal/external aids;Patient/family education;Therapeutic Activities    Pain   Prior Functioning    Short Term Goals: Week 1: SLP Short Term Goal 1 (Week 1): Patient will keep his oral cavity closed to increase mandibular strength and overall breath support for 60 seconds at a time in 50% of opportunities with Mod A multimodal cues.  SLP Short Term Goal 2 (Week 1): Patient will utilize diaphragmatic breathing to increase vocal intensity and speech intelligibility at the phrase level  to 90% accuracy with Supervision multimodal cues.  SLP Short Term Goal 3 (Week 1): Patient will consume trials of ice chips with minimal overt s/s of aspiration with 50% of trials with Min A multimodal cues.   See FIM for current functional status Refer to Care Plan for Long Term Goals  Recommendations for other services: None  Discharge Criteria: Patient will be discharged from SLP if patient refuses treatment 3 consecutive times without medical reason, if treatment goals not met, if there is a change in medical status, if patient makes no progress towards goals or if patient is discharged from hospital.  The above assessment, treatment plan, treatment alternatives and goals were discussed and mutually agreed upon: by patient and by family  [name] 12/18/2014, 4:33 PM

## 2014-12-18 NOTE — Progress Notes (Signed)
Pt. States he wears bipap each night at home but he wants to wait til his wife can bring his mask. Pt. States he prefers to wear his own mask. RT informed the RN to make note.

## 2014-12-18 NOTE — Progress Notes (Signed)
Pt had good effort.  VC 1L,  NIF -32

## 2014-12-18 NOTE — Procedures (Signed)
Pt placed on BIPAP 18/8 with home circuit and mask.

## 2014-12-18 NOTE — Evaluation (Signed)
Occupational Therapy Assessment and Plan  Patient Details  Name: Craig [name] MRN: [id number] Date of Birth: 04/26/1941  OT Diagnosis: muscle weakness (generalized) Rehab Potential: Rehab Potential (ACUTE ONLY): Good ELOS: 10-12 days   Today's Date: 12/18/2014 OT Individual Time: 0900-1000 OT Individual Time Calculation (min): 60 min     Problem List:  Patient Active Problem List   Diagnosis Date Noted  . MG, crisis (myasthenia gravis) 12/17/2014  . Dysphagia, pharyngoesophageal phase   . Enteritis due to Clostridium difficile   . Clostridium difficile diarrhea 12/15/2014  . Hypernatremia   . Acute exacerbation of myasthenia gravis   . Myasthenia gravis in crisis 12/05/2014  . Bladder cancer 12/05/2014  . HTN (hypertension) 12/05/2014  . A-fib 12/05/2014  . Chronic anticoagulation 12/05/2014  . Acute respiratory failure with hypoxia 12/05/2014    Past Medical History:  Past Medical History  Diagnosis Date  . Atrial fibrillation, chronic     on Xarelto   . HTN (hypertension)   . CHF (congestive heart failure)   . Diabetes mellitus   . 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 Abdominal wall metastasis removal      Assessment & Plan Clinical Impression: Patient is a 74 y.o. year old male history of A fib, metastatic bladder cancer, recent PNA, MG who has had worsening over past months. He was evaluated by neurology 3 days PTA and was placed on steroids with increase in mestinon due to difficulty swallowing and SOB. Patient continued to worsen and was admitted via [facility] on 12/05/14 with SOB, swallowing difficulty, difficulty hold head up with droopy eyelids and congestion. s. He was intubated due to respiratory distress and started on IV solumedrol as well as plasmapheresis per input by Dr. Doy Mince. He has responded to treatment and tolerated  extubation on 01/25 with use of BIPAP prn. He is able to suction oral secretions and is currently NPO with tube feeds for nutritional support. He was treated with PLEX X 6 days---last on 12/15/14 with reports of improvement in SOB. Neurology recommends IVIG X 3 if patient declines or does not show further improvement.   ST has been working of trials of ice chips. NIF/VC not checked in past couple of days as equipment unavailable. He was started on flagyl for c diff colitis with diarrhea and completes treatment on 02/10. Reactive leucocytosis is resolving. Hypernatremia and hypokalemia resolving with treatment and Xarelto resumed. Patient transferred to CIR on 12/17/2014 .    Patient currently requires mod-max  with basic self-care skills and min A for functional mobility secondary to muscle weakness and decr activity tolerance, decreased cardiorespiratoy endurance and decreased oxygen support, abnormal posture  and decreased standing balance, decreased postural control and decreased balance strategies.  Prior to hospitalization, patient could complete ADL with independent .  Patient will benefit from skilled intervention to decrease level of assist with basic self-care skills and increase independence with basic self-care skills prior to discharge home with care partner.  Anticipate patient will require A with IADLs and supervision for functional ambulation if require supplemental O2 and follow up home health.  OT - End of Session Activity Tolerance: Tolerates 10 - 20 min activity with multiple rests Endurance Deficit: Yes OT Assessment Rehab Potential (ACUTE ONLY): Good OT Patient demonstrates impairments in the following area(s): Balance;Endurance;Motor;Nutrition;Safety;Skin Integrity OT Basic ADL's Functional Problem(s): Eating;Grooming;Bathing;Dressing;Toileting OT Transfers Functional Problem(s): Toilet;Tub/Shower  OT Additional Impairment(s): Fuctional Use of Upper Extremity OT Plan OT  Intensity: Minimum of 1-2 x/day, 45 to 90 minutes OT Frequency: 5 out of 7 days OT Duration/Estimated Length of Stay: 10-12 days OT Treatment/Interventions: Medical illustrator training;Community reintegration;Discharge planning;DME/adaptive equipment instruction;Disease mangement/prevention;Neuromuscular re-education;Patient/family education;Self Care/advanced ADL retraining;Therapeutic Exercise;UE/LE Coordination activities;UE/LE Strength taining/ROM;Visual/perceptual remediation/compensation;Therapeutic Activities;Skin care/wound managment;Functional mobility training;Psychosocial support OT Self Feeding Anticipated Outcome(s): supervision OT Basic Self-Care Anticipated Outcome(s): mod I  OT Toileting Anticipated Outcome(s): mod I  OT Bathroom Transfers Anticipated Outcome(s): mod I  OT Recommendation Patient destination: Home Follow Up Recommendations: Home health OT Equipment Recommended: To be determined   Skilled Therapeutic Intervention OT eval initiated with pt and pt's wife with OT goals, purpose and role discussed. Self care training with focus on bed mobility, basic transfers, standing balance, sit to stand, activity tolerance, standing tolerance, achieving and maintaining head and trunk posture in upright position. Pt had been incontinent of bowel when arrived. Pt able to come to EOB with min  A with bed rail and extra time. Pt able to perform sit to stand with min  A and maintain static standing balance with right UE support on IV pole while OT performed hygiene and removed soiled liens. Transferred into w/c via stand pivot and transitioned to sink. Pt able to maintain standing position for ~2 min before sitting. Pt very limited in performing grooming and LB dressing due to significant UE right>left weakness and difficulty maintaining muscle contraction. Pt left in w/c with wife present waiting for PT to arrive.   OT Evaluation Precautions/Restrictions  Precautions Precautions:  Fall Restrictions Weight Bearing Restrictions: No General Chart Reviewed: Yes Family/Caregiver Present: Yes (wife present) Vital Signs Therapy Vitals BP: 120/68 mmHg Pain Pain Assessment Pain Assessment: No/denies pain Home Living/Prior Functioning Home Living Available Help at Discharge: Family, Available 24 hours/day Type of Home: House Home Access: Stairs to enter Technical brewer of Steps: 3 Entrance Stairs-Rails: Right Home Layout: One level, Laundry or work area in basement (basement)  Lives With: Spouse ADL ADL ADL Comments: see FIM Vision/Perception  Vision- History Baseline Vision/History: Wears glasses Wears Glasses: Reading only Patient Visual Report: Other (comment) (ptosis bilaterally left >right) Vision- Assessment Vision Assessment?: No apparent visual deficits;Yes  Cognition Overall Cognitive Status: Within Functional Limits for tasks assessed Arousal/Alertness: Awake/alert Orientation Level: Oriented X4 Safety/Judgment: Appears intact Sensation Sensation Light Touch: Appears Intact Proprioception: Appears Intact Coordination Fine Motor Movements are Fluid and Coordinated: Yes Finger Nose Finger Test: weak Motor  Motor Motor: Abnormal postural alignment and control Motor - Skilled Clinical Observations: bilateral UE weakness right>left, decr strength in head and neck Mobility  Bed Mobility Bed Mobility: Supine to Sit (with HOB elevated to 32 degrees due to tub feed) Supine to Sit: 4: Min assist;Other (comment) Transfers Transfers: Sit to Stand;Stand to Sit Sit to Stand: 4: Min assist Stand to Sit: 4: Min assist  Trunk/Postural Assessment  Cervical Assessment Cervical Assessment: Exceptions to [facility] Cervical AROM Overall Cervical AROM Comments: decr ability to turn to his left (maybe limited by central line) Cervical Strength Overall Cervical Strength Comments: head flexed forward and difficulty sustaining upright posture/ position   Thoracic Assessment Thoracic Assessment: Exceptions to [facility] (flexed posture) Thoracic Strength Overall Thoracic Strength Comments: difficulty maintaining upright posture due to fatigue Lumbar Assessment Lumbar Assessment:  (posterior pelvic tilt ) Postural Control Postural Control: Deficits on evaluation Head Control:  remains forward flexed; requires cues to achieve upright position  Balance Dynamic Sitting Balance Dynamic Sitting - Balance Support: Feet  supported;During functional activity Dynamic Sitting - Level of Assistance: 5: Stand by assistance Static Standing Balance Static Standing - Level of Assistance: 5: Stand by assistance;4: Min assist Extremity/Trunk Assessment RUE Assessment RUE Assessment: Exceptions to [facility] RUE AROM (degrees) RUE Overall AROM Comments: shoulder 90 flexion ; elbow and hand WFL RUE Strength RUE Overall Strength Comments: 3-/5  difficulty with sustaining during a functional activity against gravity LUE Assessment LUE Assessment: Exceptions to WFL LUE AROM (degrees) LUE Overall AROM Comments: WFL  LUE Strength LUE Overall Strength Comments: 3+/5  FIM:  FIM - Eating Eating Activity: 1: Helper performs IV, parenteral, or tube feeding FIM - Grooming Grooming Steps: Wash, rinse, dry face;Wash, rinse, dry hands;Oral care, brush teeth, clean dentures;Brush, comb hair;Shave or apply make-up Grooming: 2: Patient completes 1 of 4 or 2 of 5 steps FIM - Bathing Bathing Steps Patient Completed: Chest;Abdomen;Right upper leg;Left upper leg Bathing: 2: Max-Patient completes 3-4 50f10 parts or 25-49% FIM - Upper Body Dressing/Undressing Upper body dressing/undressing: 1: Total-Patient completed less than 25% of tasks FIM - Lower Body Dressing/Undressing Lower body dressing/undressing steps patient completed: Don/Doff left shoe Lower body dressing/undressing: 1: Total-Patient completed less than 25% of tasks FIM - BBuyer, retailDevices: HOB elevated Bed/Chair Transfer: 4: Supine > Sit: Min A (steadying Pt. > 75%/lift 1 leg);4: Bed > Chair or W/C: Min A (steadying Pt. > 75%) (HOB remained at 32 degrees due to tube feed)   Refer to Care Plan for Long Term Goals  Recommendations for other services: None  Discharge Criteria: Patient will be discharged from OT if patient refuses treatment 3 consecutive times without medical reason, if treatment goals not met, if there is a change in medical status, if patient makes no progress towards goals or if patient is discharged from hospital.  The above assessment, treatment plan, treatment alternatives and goals were discussed and mutually agreed upon: by patient  SNicoletta Ba2/02/2015, 11:00 AM

## 2014-12-18 NOTE — Discharge Instructions (Signed)
Information on my medicine - XARELTO (Rivaroxaban)  This medication education was reviewed with me or my healthcare representative as part of my discharge preparation.  The pharmacist that spoke with me during my hospital stay was:  Steffanie Dunn, PharmD  Why was Xarelto prescribed for you? Xarelto was prescribed for you to reduce the risk of a blood clot forming that can cause a stroke if you have a medical condition called atrial fibrillation (a type of irregular heartbeat).  What do you need to know about xarelto ? Take your Xarelto ONCE DAILY at the same time every day with your evening meal. If you have difficulty swallowing the tablet whole, you may crush it and mix in applesauce just prior to taking your dose.  Take Xarelto exactly as prescribed by your doctor and DO NOT stop taking Xarelto without talking to the doctor who prescribed the medication.  Stopping without other stroke prevention medication to take the place of Xarelto may increase your risk of developing a clot that causes a stroke.  Refill your prescription before you run out.  After discharge, you should have regular check-up appointments with your healthcare provider that is prescribing your Xarelto.  In the future your dose may need to be changed if your kidney function or weight changes by a significant amount.  What do you do if you miss a dose? If you are taking Xarelto ONCE DAILY and you miss a dose, take it as soon as you remember on the same day then continue your regularly scheduled once daily regimen the next day. Do not take two doses of Xarelto at the same time or on the same day.   Important Safety Information A possible side effect of Xarelto is bleeding. You should call your healthcare provider right away if you experience any of the following: ? Bleeding from an injury or your nose that does not stop. ? Unusual colored urine (red or dark brown) or unusual colored stools (red or black). ? Unusual  bruising for unknown reasons. ? A serious fall or if you hit your head (even if there is no bleeding).  Some medicines may interact with Xarelto and might increase your risk of bleeding while on Xarelto. To help avoid this, consult your healthcare provider or pharmacist prior to using any new prescription or non-prescription medications, including herbals, vitamins, non-steroidal anti-inflammatory drugs (NSAIDs) and supplements.  This website has more information on Xarelto: [email or web address]

## 2014-12-19 ENCOUNTER — Encounter (HOSPITAL_COMMUNITY): Payer: PPO | Admitting: Occupational Therapy

## 2014-12-19 ENCOUNTER — Inpatient Hospital Stay (HOSPITAL_COMMUNITY): Payer: PPO

## 2014-12-19 ENCOUNTER — Inpatient Hospital Stay (HOSPITAL_COMMUNITY): Payer: PPO | Admitting: Speech Pathology

## 2014-12-19 ENCOUNTER — Encounter (HOSPITAL_COMMUNITY): Payer: Self-pay | Admitting: *Deleted

## 2014-12-19 LAB — BASIC METABOLIC PANEL
Anion gap: 5 (ref 5–15)
BUN: 16 mg/dL (ref 6–23)
CALCIUM: 8.7 mg/dL (ref 8.4–10.5)
CO2: 30 mmol/L (ref 19–32)
Chloride: 108 mmol/L (ref 96–112)
Creatinine, Ser: 0.6 mg/dL (ref 0.50–1.35)
GFR calc Af Amer: >90 mL/min (ref >90)
GLUCOSE: 169 mg/dL — AB (ref 70–99)
Potassium: 3.1 mmol/L — ABNORMAL LOW (ref 3.5–5.1)
Sodium: 143 mmol/L (ref 135–145)

## 2014-12-19 LAB — GLUCOSE, CAPILLARY
Glucose-Capillary: 127 mg/dL — ABNORMAL HIGH (ref 70–99)
Glucose-Capillary: 136 mg/dL — ABNORMAL HIGH (ref 70–99)
Glucose-Capillary: 144 mg/dL — ABNORMAL HIGH (ref 70–99)
Glucose-Capillary: 153 mg/dL — ABNORMAL HIGH (ref 70–99)
Glucose-Capillary: 212 mg/dL — ABNORMAL HIGH (ref 70–99)
Glucose-Capillary: 232 mg/dL — ABNORMAL HIGH (ref 70–99)

## 2014-12-19 MED ORDER — POTASSIUM CHLORIDE 20 MEQ/15ML (10%) PO SOLN
20.0000 meq | Freq: Three times a day (TID) | ORAL | Status: DC
  Administered 2014-12-19 – 2014-12-26 (×21): 20 meq via ORAL
  Filled 2014-12-19 (×24): qty 15

## 2014-12-19 NOTE — IPOC Note (Addendum)
Overall Plan of Care [facility]) Patient Details Name: Martrell Eguia MRN: [id number] DOB: November 02, 1941  Admitting Diagnosis: [facility] Problems: Principal Problem:   MG, crisis (myasthenia gravis) Active Problems:   Dysphagia, pharyngoesophageal phase   Enteritis due to Clostridium difficile     Functional Problem List: Nursing Bowel, Medication Management, Safety, Skin Integrity, Pain  PT Balance, Edema, Endurance, Motor, Pain, Skin Integrity  OT Balance, Endurance, Motor, Nutrition, Safety, Skin Integrity  SLP    TR Activity tolerance, functional mobility, balance, safety, pain       Basic ADL's: OT Eating, Grooming, Bathing, Dressing, Toileting     Advanced  ADL's: OT       Transfers: PT Bed Mobility, Bed to Chair, Car, Manufacturing systems engineer, Metallurgist: PT Ambulation, Stairs     Additional Impairments: OT Fuctional Use of Upper Extremity  SLP Swallowing, Communication expression    TR      Anticipated Outcomes Item Anticipated Outcome  Self Feeding supervision  Swallowing  Mod A    Basic self-care  mod I   Toileting  mod I    Bathroom Transfers mod I   Bowel/Bladder  LBM 2/3 Continent of bladder   Transfers  mod I basic; S car  Locomotion  S gait (S due to possibility of needing O2 at d/c)  Communication  Supervision   Cognition     Pain  <3  Safety/Judgment  Supervision   Therapy Plan: PT Intensity: Minimum of 1-2 x/day ,45 to 90 minutes PT Frequency: 5 out of 7 days PT Duration Estimated Length of Stay: 10-12 days OT Intensity: Minimum of 1-2 x/day, 45 to 90 minutes OT Frequency: 5 out of 7 days OT Duration/Estimated Length of Stay: 10-12 days SLP Intensity: Minumum of 1-2 x/day, 30 to 90 minutes SLP Frequency: 5 out of 7 days SLP Duration/Estimated Length of Stay: 10-12 days per team, however, dysphagia goals will require an extended amount of time   TR Duration/ELOS: 2 weeks TR Frequency:  Min 1 time per  week >20 minutes        Team Interventions: Nursing Interventions Patient/Family Education, Bowel Management, Pain Management, Medication Management, Disease Management/Prevention, Skin Care/Wound Management  PT interventions Ambulation/gait training, Training and development officer, Community reintegration, Discharge planning, Disease management/prevention, DME/adaptive equipment instruction, Functional mobility training, Neuromuscular re-education, Pain management, Patient/family education, Psychosocial support, Skin care/wound management, Splinting/orthotics, Stair training, Therapeutic Activities, Therapeutic Exercise, UE/LE Strength taining/ROM, UE/LE Coordination activities, Wheelchair propulsion/positioning  OT Interventions Training and development officer, Academic librarian, Discharge planning, Engineer, drilling, Disease mangement/prevention, Neuromuscular re-education, Patient/family education, Self Care/advanced ADL retraining, Therapeutic Exercise, UE/LE Coordination activities, UE/LE Strength taining/ROM, Visual/perceptual remediation/compensation, Therapeutic Activities, Skin care/wound managment, Functional mobility training, Psychosocial support  SLP Interventions Environmental controls, Dysphagia/aspiration precaution training, Cueing hierarchy, Functional tasks, Internal/external aids, Patient/family education, Therapeutic Activities  TR Interventions Recreation/leisure participation, Balance/Vestibular training, functional mobility, therapeutic activities, UE/LE strength/coordination, w/c mobility, community reintegration, pt/family education, adaptive equipment instruction/use, discharge planning, psychosocial support  SW/CM Interventions Discharge Planning, Barrister's clerk, Patient/Family Education    Team Discharge Planning: Destination: PT-Home ,OT- Home , SLP-Home Projected Follow-up: PT-Home health PT, OT-  Home health OT, SLP-Outpatient SLP, Home Health  SLP, 24 hour supervision/assistance Projected Equipment Needs: PT-To be determined, OT- To be determined, SLP-To be determined (May need suction ) Equipment Details: PT-Pt already has SPC, OT-  Patient/family involved in discharge planning: PT- Patient, Family member/caregiver,  OT-Patient, Family member/caregiver, SLP-Patient  MD ELOS: 10-12 days Medical Rehab Prognosis:  Excellent  Assessment: The patient has been admitted for CIR therapies with the diagnosis of Myasthenia Gravis crisis. The team will be addressing functional mobility, strength, stamina, balance, safety, adaptive techniques and equipment, self-care, bowel and bladder mgt, patient and caregiver education, speech, swallowing, communication, ego-support. Goals have been set at mod I for basic self-care and transfers, supervision for gait, supervision for communication and ?sup to min assist for swallowing depending on recovery.    Meredith Staggers, MD, FAAPMR      See Team Conference Notes for weekly updates to the plan of care

## 2014-12-19 NOTE — Progress Notes (Signed)
Speech Language Pathology Daily Session Note  Patient Details  Name: Craig [name] MRN: [id number] Date of Birth: 10/04/1941  Today's Date: 12/19/2014 SLP Individual Time: 1530-1600 SLP Individual Time Calculation (min): 30 min  Short Term Goals: Week 1: SLP Short Term Goal 1 (Week 1): Patient will keep his oral cavity closed to increase mandibular strength and overall breath support for 60 seconds at a time in 50% of opportunities with Mod A multimodal cues.  SLP Short Term Goal 2 (Week 1): Patient will utilize diaphragmatic breathing to increase vocal intensity and speech intelligibility at the phrase level  to 90% accuracy with Supervision multimodal cues.  SLP Short Term Goal 3 (Week 1): Patient will consume trials of ice chips with minimal overt s/s of aspiration with 50% of trials with Min A multimodal cues.   Skilled Therapeutic Interventions: Skilled treatment session focused on addressing dysphagia goals. Upon SLP entering room patient reporting that he was feeling SOB with difficulty breathing.  SLP facilitated session by providing Max multimodal cues to bring suspected pharyngeal congestion to oral cavity so SLP could perform oral suction.  Despite attempts patient remained congested, RN notified.  Patient appeared fatigued and as a result required Max multimodal cues for bilabial seal and use of speech intelligibility strategies at the phrase level.  Wife able to recall goals of care and recommendations for carryover.  Continue with current plan of care.   FIM:  Comprehension Comprehension: 5-Understands complex 90% of the time/Cues < 10% of the time Expression Expression Mode: Verbal Expression: 5-Expresses basic 90% of the time/requires cueing < 10% of the time. Social Interaction Social Interaction: 7-Interacts appropriately with others - No medications needed. Problem Solving Problem Solving: 6-Solves complex problems: With extra time Memory Memory: 5-Requires cues to use  assistive device FIM - Eating Eating Activity: 1: Helper feeds patient  Pain Pain Assessment Pain Assessment: No/denies pain  Therapy/Group: Individual Therapy  Carmelia Roller., [place]  [place] 12/19/2014, 4:33 PM

## 2014-12-19 NOTE — Progress Notes (Signed)
Occupational Therapy Session Note  Patient Details  Name: Craig [name] MRN: [id number] Date of Birth: February 07, 1941  Today's Date: 12/19/2014 OT Individual Time: 0800-0900 OT Individual Time Calculation (min): 60 min    Short Term Goals: Week 1:  OT Short Term Goal 1 (Week 1): Pt will transfer to BSC/ toilet with supervision OT Short Term Goal 2 (Week 1): Pt will perform 3/3 toileting tasks with steady  A OT Short Term Goal 3 (Week 1): Pt will perform toothbrushing (with suction) with setup OT Short Term Goal 4 (Week 1): Pt will obtain own clothing in prep for ADL tasks with steady A (in standing). OT Short Term Goal 5 (Week 1): Pt will thread LB clothing with setup   Skilled Therapeutic Interventions/Progress Updates:    1:1 self care retraining at sink level including bathing and dressing. Pt able to come to EOB with bed rail with supervision.  HOB was elevated due to NG precautions. Pt able to ambulate to bathroom with HHA with min A with 2nd person managing tube feeding pole and O2 tank. Pt able to participate in 2/3 toileting steps with steady A and was continent of bowel and bladder but required extra time to void. Ambulated to sink with HHA with min A to sink. Pt with c/o increased fatigue compared to yesterday but able to continue to rest breaks through out session. Pt able to wash both UEs with min A for support at elbow against gravity. Introduced foto stool to prop feet up to don and doff socks and thread pants. Pt was successful with extra time and praise.  Pt continues to require A to don shirt due to mangement of feeding tube. At end of session pt declined trying ot brush his own teeth with suction toothbrush due to fatigue and required total A. Pt able to tolerate just being on 2 liters of O2 throughout session with sats ranging from 93-98%.  Pt didn't feel he would tolerate 1 liter because at times he still feels SOB. Left in room with RN and wife.  Wife observed session and continue  to discuss POC and goals towards d/c.  Therapy Documentation Precautions:  Precautions Precautions: Fall, Other (comment) Precaution Comments: 3L O2; NG tube Restrictions Weight Bearing Restrictions: No Other Position/Activity Restrictions: HOB elevated due to continuous tube feeds. Pain:  no c/o pain in the session  ADL: ADL ADL Comments: see FIM  See FIM for current functional status  Therapy/Group: Individual Therapy  W[facility]en [facility] 12/19/2014, 11:48 AM

## 2014-12-19 NOTE — Progress Notes (Signed)
Physical Therapy Session Note  Patient Details  Name: Craig [name] MRN: [id number] Date of Birth: 21-Jan-1941  Today's Date: 12/19/2014 PT Individual Time: 1030-1130 PT Individual Time Calculation (min): 60 min   Short Term Goals: Week 1:  PT Short Term Goal 1 (Week 1): Pt will be able to demonstrate bed mobility with S PT Short Term Goal 2 (Week 1): Pt will be able to transfer with S for basic surfaces PT Short Term Goal 3 (Week 1): Pt will be able to go up/down 3 steps with 1 rail (R) with min A  Skilled Therapeutic Interventions/Progress Updates:    Session focused on functional transfers, endurance, strengthening, gait training, and stair negotiation. Pt able to propel w/c with BUE for functional endurance and strengthening down to therapy gym with 3 rest breaks at S level. Introduced [facility] for gait training as pt used this AD for gait PTA with overall min A (+2 needed for management of pole and O2 tank) with cues for upright posture, gait speed (tends to take quick steps during turns with some unsteadyness noted) including dynamic gait through obstacle course navigating turns to simulate home environment. Stair negotiation in preparation for home entry with B and 1 hand rail with min A x 3 reps with seated breaks in between. Simulated car transfer to lower SUV height with min A overall and cues for technique and safety with tubes/lines management. O2 sats remained >= 90% during all activity with rest breaks as needed due to fatigue. Pt's wife and daughter present for session as well.   Therapy Documentation Precautions:  Precautions Precautions: Fall, Other (comment) Precaution Comments: O2 via [place]; NG tube Restrictions Weight Bearing Restrictions: No Other Position/Activity Restrictions: HOB elevated due to continuous tube feeds.  Pain:   Denies pain. C/o being tired today.  See FIM for current functional status  Therapy/Group: Individual Therapy  [name] 12/19/2014,  12:18 PM

## 2014-12-19 NOTE — Progress Notes (Signed)
Pt had good effort. VC .7 and NIF -35

## 2014-12-19 NOTE — Progress Notes (Signed)
Performed Nif with Vital Capacity on patient. Results Nif=-30 with Vital Capacity on .8L. Placed patient on BIPAP for the night with IPAP set at 18cm and EPAP set at 8cm. Oxygen set at 3lpm

## 2014-12-19 NOTE — Progress Notes (Signed)
Speech Language Pathology Daily Session Note  Patient Details  Name: Craig [name] MRN: [id number] Date of Birth: 07-Sep-1941  Today's Date: 12/19/2014 SLP Individual Time: 1135-1205 SLP Individual Time Calculation (min): 30 min  Short Term Goals: Week 1: SLP Short Term Goal 1 (Week 1): Patient will keep his oral cavity closed to increase mandibular strength and overall breath support for 60 seconds at a time in 50% of opportunities with Mod A multimodal cues.  SLP Short Term Goal 2 (Week 1): Patient will utilize diaphragmatic breathing to increase vocal intensity and speech intelligibility at the phrase level  to 90% accuracy with Supervision multimodal cues.  SLP Short Term Goal 3 (Week 1): Patient will consume trials of ice chips with minimal overt s/s of aspiration with 50% of trials with Min A multimodal cues.   Skilled Therapeutic Interventions: Skilled treatment session focused on speech and dysphagia goals. SLP facilitated session by providing supervision verbal cues for use of an increased vocal intensity and overt-articulation at the phrase level to increase speech intelligibility to ~95%.  Patient also performed diaphragmatic breathing exercises with Mod A multimodal cues and was able to sustain phonation of /ah/ for ~6 seconds in 1 of 3 trials. Patient required increased cueing (Min A) to maintain adequate head posture for PO intake, suspect due to fatigue but demonstrated increased ability to keep his oral cavity closed throughout the session. SLP performed thorough oral care via suction toothbrush and patient consumed trials of ice chips with encouragement and demonstrated overt cough/throat clear with 70% of trials.  Patient declined further trials due to fatigue. Patient left upright in recliner with all needs within reach. Continue with current plan of care.    FIM:  Comprehension Comprehension: 5-Understands complex 90% of the time/Cues < 10% of the time Expression Expression Mode:  Verbal Expression: 5-Expresses basic 90% of the time/requires cueing < 10% of the time. Social Interaction Social Interaction: 7-Interacts appropriately with others - No medications needed. Problem Solving Problem Solving: 6-Solves complex problems: With extra time Memory Memory: 5-Requires cues to use assistive device FIM - Eating Eating Activity: 1: Helper feeds patient  Pain Pain Assessment Pain Assessment: No/denies pain  Therapy/Group: Individual Therapy  Dorann Davidson 12/19/2014, 2:19 PM

## 2014-12-19 NOTE — Progress Notes (Signed)
PHYSICAL MEDICINE & REHABILITATION     PROGRESS NOTE    Subjective/Complaints: Had a much better night. BIPAP worked well. Secretions seem better. Denies pain A  review of systems has been performed and if not noted above is otherwise negative.  Objective: Vital Signs: Blood pressure 106/58, pulse 65, temperature 98.1 F (36.7 C), temperature source Axillary, resp. rate 17, weight 103.9 kg (229 lb 0.9 oz), SpO2 96 %. No results found.  Recent Labs  12/18/14 0500  WBC 19.3*  HGB 13.1  HCT 38.2*  PLT 175    Recent Labs  12/18/14 0500 12/19/14 0615  NA 141 143  K 3.4* 3.1*  CL 102 108  GLUCOSE 132* 169*  BUN 13 16  CREATININE 0.55 0.60  CALCIUM 9.0 8.7   CBG (last 3)   Recent Labs  12/19/14 0002 12/19/14 0403 12/19/14 0755  GLUCAP 127* 136* 144*    Wt Readings from Last 3 Encounters:  12/19/14 103.9 kg (229 lb 0.9 oz)  12/17/14 107.4 kg (236 lb 12.4 oz)  05/03/10 131.543 kg (290 lb)    Physical Exam:  Nursing note and vitals reviewed. Constitutional: He appears well-developed and well-nourished.  Fatigued. Panda in place left nare.  HENT: secretions which he attempts to cough up--has problems when they enter the oral cavity as far as secreting them is concerned. Head: Normocephalic and atraumatic.  Eyes: Left conjunctiva somewhat injected.  Right ptosis improving---lids more symmetrical  Cardiovascular: Normal rate and regular rhythm.  Respiratory: Effort normal. No respiratory distress. He has no wheezes. He has rhonchi.  GI: Soft. Bowel sounds are normal. He exhibits no distension. There is no tenderness.  Musculoskeletal: He exhibits no edema or tenderness.  Neurological: He is alert.  Facial diplegia. Poor oral-motor control--a little more severe today. Articulation and phonation worse than yesterday. Ptosis bilaterally, left more than right. Followed motor commands without difficulty. Reasonable insight and awareness. Sensory  exam intact to PP and LT. Strength 4/5 UE's bilaterally prox to distal. LE's 3+/5 hf, 4/5ke, 4 ankles.  Skin: Skin is warm and dry.  Psychiatric: He has a normal mood and affect. His behavior is normal. Good insight and awareness  Assessment/Plan: 1. 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 Physiatrist is providing close team supervision and 24 hour management of active medical problems listed below. Physiatrist and rehab team continue to assess barriers to discharge/monitor patient progress toward functional and medical goals. FIM: FIM - Bathing Bathing Steps Patient Completed: Chest, Abdomen, Right upper leg, Left upper leg Bathing: 2: Max-Patient completes 3-4 46f 10 parts or 25-49%  FIM - Upper Body Dressing/Undressing Upper body dressing/undressing: 1: Total-Patient completed less than 25% of tasks FIM - Lower Body Dressing/Undressing Lower body dressing/undressing steps patient completed: Don/Doff left shoe Lower body dressing/undressing: 1: Total-Patient completed less than 25% of tasks  FIM - Musician Devices: Grab bar or rail for support Toileting: 4: Steadying assist (Pulls down brief while holding on to rail for steadying assist.)  FIM - Radio producer Devices: Elevated toilet seat  FIM - Control and instrumentation engineer Devices: HOB elevated Bed/Chair Transfer: 5: Supine > Sit: Supervision (verbal cues/safety issues) (Use of bed rail for support)  FIM - Locomotion: Wheelchair Locomotion: Wheelchair: 1: Travels less than 50 ft with minimal assistance (Pt.>75%) FIM - Locomotion: Ambulation Locomotion: Ambulation Assistive Devices: Other (comment) (HHA) Ambulation/Gait Assistance: 4: Min assist,  1: +2 Total assist (+2 for O2 and Tube Feeds pole management) Locomotion: Ambulation: 1: Two  helpers  Comprehension Comprehension Mode: Auditory Comprehension: 5-Understands complex 90% of the time/Cues < 10% of the time  Expression Expression Mode: Verbal Expression: 5-Expresses basic 90% of the time/requires cueing < 10% of the time.  Social Interaction Social Interaction: 7-Interacts appropriately with others - No medications needed.  Problem Solving Problem Solving: 6-Solves complex problems: With extra time  Memory Memory: 5-Recognizes or recalls 90% of the time/requires cueing < 10% of the time   Medical Problem List and Plan: 1. Functional deficits secondary to MG crises 2. DVT Prophylaxis/Anticoagulation: Pharmaceutical: Xarelto 3. Pain Management: Tylenol prn 4. Mood: Team to provide ego support. LCSW to follow for evaluation and support.  5. Neuropsych: This patient is capable of making decisions on her own behalf. 6. Skin/Wound Care: routine pressure relief measures. Maintain adequate nutrition and hydration status.  7. Fluids/Electrolytes/Nutrition: Continue TF. Added questran for bulking. Continue water flushes every 4 hours.    8. C diff colitis: On flagyl D# 7/14. added probiotics/fiber. Change formula? 9. A fib: Monitor heart rate every 8 hours. Continue amiodarone and Xarelto.  10. Diarrhea: Exacerbated by tube feeds and c diff. Will add [place] daily. May need to change tube feeds to one with fiber. Will consult RD for input.  11. Hypokalemia: Add supplement. Level slightly improved today.  12. MG crises: On prednisone 60 mg/daily and mestinon 90 mg qid (increased few days PTA). Monitor for recovery past PLEX.  13. Continued oro-pharyngeal dysphagia---NGT for now -I am hopeful that with improving neuro status in other bulbar muscles, we'll see the same here -may have to consider G-tube however 14. Respiratory: BIPAP worked well. On home settings  -nebs prn  -suction, coughing, IS  -oxygen 2-3L    LOS (Days) 2 A FACE TO FACE  EVALUATION WAS PERFORMED  Bane Hagy T 12/19/2014 8:56 AM

## 2014-12-20 ENCOUNTER — Inpatient Hospital Stay (HOSPITAL_COMMUNITY): Payer: PPO | Admitting: *Deleted

## 2014-12-20 ENCOUNTER — Inpatient Hospital Stay (HOSPITAL_COMMUNITY): Payer: PPO

## 2014-12-20 ENCOUNTER — Inpatient Hospital Stay (HOSPITAL_COMMUNITY): Payer: PPO | Admitting: Speech Pathology

## 2014-12-20 LAB — GLUCOSE, CAPILLARY
GLUCOSE-CAPILLARY: 129 mg/dL — AB (ref 70–99)
GLUCOSE-CAPILLARY: 157 mg/dL — AB (ref 70–99)
GLUCOSE-CAPILLARY: 166 mg/dL — AB (ref 70–99)
GLUCOSE-CAPILLARY: 188 mg/dL — AB (ref 70–99)
GLUCOSE-CAPILLARY: 262 mg/dL — AB (ref 70–99)
Glucose-Capillary: 165 mg/dL — ABNORMAL HIGH (ref 70–99)
Glucose-Capillary: 94 mg/dL (ref 70–99)

## 2014-12-20 NOTE — Progress Notes (Signed)
Physical Therapy Session Note  Patient Details  Name: Craig [name] MRN: [id number] Date of Birth: 04-25-1941  Today's Date: 12/20/2014 PT Individual Time: 0900-1000 PT Individual Time Calculation (min): 60 min   Short Term Goals: Week 1:  PT Short Term Goal 1 (Week 1): Pt will be able to demonstrate bed mobility with S PT Short Term Goal 2 (Week 1): Pt will be able to transfer with S for basic surfaces PT Short Term Goal 3 (Week 1): Pt will be able to go up/down 3 steps with 1 rail (R) with min A  Skilled Therapeutic Interventions/Progress Updates:  1:1. Pt received semi-reclined in bed, ready for therapy. Focus this session on activity tolerance, functional transfers, balance, postural control as well as functional w/c propulsion and ambulation.  Pt req supervision for t/f sup>sit EOB with use of hospital bed functions and consistent min A for multiple t/f sit<>stand or SPT with use of SPC. Pt req intermittent encouragement to attempt things before asking wife for assistance, such as to put on shoes.   Pt engaged in assembling 2 complex pipe tree diagrams, to target endurance- standing for 6min each time, balance with intermittent single UE support, head/trunk extension by having to look up at diagram. Pt req initial min cues for problem solving and understanding directions. Pt with overall excellent tolerance, req close(S)-occasional min guard A.   Pt able to propel w/c 175x1' with B UE and supervision with overall good endurance, but slow pace. Pt able to amb 175'x1 back to room at end of session with use of SPC and min Ax1 person, however, +2 present to manage IV pole + O2 tank. Gait characterized by overall flexed posture, cued for head extension as able.  Pt moving very slowly overall, req short intermittent rest breaks due to fatigue. SaO2 >95% throughout session on 2L [name]. Pt left sitting in recliner at end of session with all needs in reach and wife in room.   Therapy  Documentation Precautions:  Precautions Precautions: Fall, Other (comment) Precaution Comments: O2 via [place]; NG tube Restrictions Weight Bearing Restrictions: No Other Position/Activity Restrictions: HOB elevated due to continuous tube feeds.  See FIM for current functional status  Therapy/Group: Individual Therapy  Gilmore Laroche 12/20/2014, 12:14 PM

## 2014-12-20 NOTE — Progress Notes (Signed)
Speech Language Pathology Daily Session Note  Patient Details  Name: Craig [name] MRN: [id number] Date of Birth: [birth year]  Today's Date: 12/20/2014 SLP Individual Time: 1050-1140 SLP Individual Time Calculation (min): 50 min  Short Term Goals: Week 1: SLP Short Term Goal 1 (Week 1): Patient will keep his oral cavity closed to increase mandibular strength and overall breath support for 60 seconds at a time in 50% of opportunities with Mod A multimodal cues.  SLP Short Term Goal 2 (Week 1): Patient will utilize diaphragmatic breathing to increase vocal intensity and speech intelligibility at the phrase level  to 90% accuracy with Supervision multimodal cues.  SLP Short Term Goal 3 (Week 1): Patient will consume trials of ice chips with minimal overt s/s of aspiration with 50% of trials with Min A multimodal cues.   Skilled Therapeutic Interventions:  Pt was seen for skilled ST targeting dysphagia goals.  Upon arrival, pt was partially reclined in easy chair, awake, lethargic, but agreeable to participate in [name].  Pt initially presented with relatively clear vocal quality with decreased intensity.  Per wife's report, pt was able to orally expectorate secretions during PT session today.  Pt also exhibited x1 instance of orally expectorating a small amount of thin clear secretions during today's session.  SLP encouraged pt to continue to attempt to expectorate secretions as able.   SLP facilitated the session with trials of ice chips following oral care to continue working towards initiation of a PO diet.  Pt exhibited an immediate cough on approximately 25% of trials, with coughing occurrences increasing as trials progressed due to fatigue.  Pt exhibited good awareness of s/s of decreased toleration and declined further trials.  SLP also provided skilled education related to diaphragmatic breathing to improve vocal intensity during speech with pt able to return demonstration with mod-max assist multimodal  cuing.  Continue per current plan of care.    FIM:  Comprehension Comprehension Mode: Auditory Comprehension: 5-Understands basic 90% of the time/requires cueing < 10% of the time Expression Expression Mode: Verbal Expression: 5-Expresses basic 90% of the time/requires cueing < 10% of the time. Social Interaction Social Interaction: 6-Interacts appropriately with others with medication or extra time (anti-anxiety, antidepressant). Problem Solving Problem Solving: 5-Solves basic 90% of the time/requires cueing < 10% of the time Memory Memory: 5-Requires cues to use assistive device FIM - Eating Eating Activity: 1: Helper feeds patient (with trials )  Pain Pain Assessment Pain Assessment: No/denies pain  Therapy/Group: Individual Therapy   Windell Moulding, M.A. CCC-SLP  Karey Suthers, Selinda Orion 12/20/2014, 4:01 PM

## 2014-12-20 NOTE — Progress Notes (Signed)
Good pt effort. VC .8 and NIF -35

## 2014-12-20 NOTE — Progress Notes (Addendum)
Placed patient on BIPAP via FFM, IPAP 18, EPAP 8 with 2 lpm O2 bleed in.  Patient tolerating well at this time.  VC 0.8L  NIF -38   Patient has good effort.

## 2014-12-20 NOTE — Progress Notes (Signed)
Occupational Therapy Session Note  Patient Details  Name: Craig [name] MRN: [id number] Date of Birth: 1941/03/11  Today's Date: 12/20/2014 OT Individual Time: 1300-1400 OT Individual Time Calculation (min): 60 min    Short Term Goals: Week 1:  OT Short Term Goal 1 (Week 1): Pt will transfer to BSC/ toilet with supervision OT Short Term Goal 2 (Week 1): Pt will perform 3/3 toileting tasks with steady  A OT Short Term Goal 3 (Week 1): Pt will perform toothbrushing (with suction) with setup OT Short Term Goal 4 (Week 1): Pt will obtain own clothing in prep for ADL tasks with steady A (in standing). OT Short Term Goal 5 (Week 1): Pt will thread LB clothing with setup   Skilled Therapeutic Interventions/Progress Updates:    Pt received semi-reclined in bed, ready for therapy. Focus this session on activity tolerance, functional transfers, balance, postural control as well as functional w/c propulsion and ambulation.Pt. Still in hospital gown.  Donned pants with max assist.  Donned shoes with mod assist.  Pt. On 2 liter oxygen and NG tube feeding.   Pt propelled wc to ADL kitchen.  Ambulated in kitchen to prepare grilled cheese.  Pt. Able to reach in cabinet and get frying pan.  Moved around with [place] or counter tops with SBA and 1 small LOB but had stepping response and able to regain.  He stood for 15 minutes with 2 rest breaks.  After rest breaks, ambulated and stood at sink and washed dishes.  Propelled wc to room.  Oxygen= 96%.  Pt. Transferred to recliner and rested.  Wife present during session and assisted pt as needed.    Therapy Documentation Precautions:  Precautions Precautions: Fall, Other (comment) Precaution Comments: O2 via [place]; NG tube Restrictions Weight Bearing Restrictions: No Other Position/Activity Restrictions: HOB elevated due to continuous tube feeds.      Pain: Pain Assessment Pain Assessment: No/denies pain ADL: ADL ADL Comments: see FIM       See FIM for  current functional status  Therapy/Group: Individual Therapy  Lisa Roca 12/20/2014, 5:32 PM

## 2014-12-20 NOTE — Progress Notes (Signed)
Physical Therapy Session Note  Patient Details  Name: Craig [name] MRN: [id number] Date of Birth: Sep 14, 1941  Today's Date: 12/20/2014 PT Individual Time: 1410-1440 PT Individual Time Calculation (min): 30 min   Short Term Goals: Week 1:  PT Short Term Goal 1 (Week 1): Pt will be able to demonstrate bed mobility with S PT Short Term Goal 2 (Week 1): Pt will be able to transfer with S for basic surfaces PT Short Term Goal 3 (Week 1): Pt will be able to go up/down 3 steps with 1 rail (R) with min A  Skilled Therapeutic Interventions/Progress Updates:  1:1. Pt received sitting in recliner, ready for therapy. Focus this session on safety during functional transfers, functional w/c propulsion and B UE/LE strength. Pt req min guard A for multiple SPT this session recliner<>w/c<>NuStep with intermittent cueing for hand placement. Pt with overall good tolerance to w/c propulsion 175'x1 with B UE, demonstrating steady pace. Pt utilized NuStep to target B UE/LE strength, level 4x65minutes with consistent pace in 40s. Pt transported back to room at end of session via w/c due to time management. Pt left sitting in recliner with all needs in reach, wife in rom.   Therapy Documentation Precautions:  Precautions Precautions: Fall, Other (comment) Precaution Comments: O2 via ; NG tube Restrictions Weight Bearing Restrictions: No Other Position/Activity Restrictions: HOB elevated due to continuous tube feeds.  See FIM for current functional status  Therapy/Group: Individual Therapy  Gilmore Laroche 12/20/2014, 3:22 PM

## 2014-12-20 NOTE — Progress Notes (Signed)
[place] PHYSICAL MEDICINE & REHABILITATION     PROGRESS NOTE    Subjective/Complaints: No problems reported, breathing well with Bi Pap Denies pain A  review of systems has been performed and if not noted above is otherwise negative.  Objective: Vital Signs: Blood pressure 100/56, pulse 59, temperature 98.6 F (37 C), temperature source Axillary, resp. rate 17, height 5\' 7"  (1.702 m), weight 101.8 kg (224 lb 6.9 oz), SpO2 97 %. No results found.  Recent Labs  12/18/14 0500  WBC 19.3*  HGB 13.1  HCT 38.2*  PLT 175    Recent Labs  12/18/14 0500 12/19/14 0615  NA 141 143  K 3.4* 3.1*  CL 102 108  GLUCOSE 132* 169*  BUN 13 16  CREATININE 0.55 0.60  CALCIUM 9.0 8.7   CBG (last 3)   Recent Labs  12/19/14 2011 12/20/14 0004 12/20/14 0417  GLUCAP 212* 129* 157*    Wt Readings from Last 3 Encounters:  12/20/14 101.8 kg (224 lb 6.9 oz)  12/17/14 107.4 kg (236 lb 12.4 oz)  05/03/10 131.543 kg (290 lb)    Physical Exam:  Nursing note and vitals reviewed. Constitutional: He appears well-developed and well-nourished.  Fatigued. Panda in place left nare.  HENT: BiBap mask secured Head: Normocephalic and atraumatic.  Eyes: Left conjunctiva somewhat injected.  No  ptosis lids more symmetrical  Cardiovascular: Normal rate and regular rhythm.  Respiratory: Effort normal. No respiratory distress. He has no wheezes. He has rhonchi.  GI: Soft. Bowel sounds are normal. He exhibits no distension. There is no tenderness.  Musculoskeletal: He exhibits no edema or tenderness.  Neurological: He is alert.  Facial diplegia.. Followed motor commands without difficulty. Reasonable insight and awareness. Sensory exam intact to PP and LT. Strength 4/5 UE's bilaterally prox to distal. LE's4-/5 hf, 4/5ke, 4 ankles.  Skin: Skin is warm and dry.  Psychiatric: He has a normal mood and affect. His behavior is normal. Good insight and awareness  Assessment/Plan: 1.  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 Physiatrist is providing close team supervision and 24 hour management of active medical problems listed below. Physiatrist and rehab team continue to assess barriers to discharge/monitor patient progress toward functional and medical goals. FIM: FIM - Bathing Bathing Steps Patient Completed: Chest, Abdomen, Right upper leg, Left upper leg, Front perineal area (Pt required assist for thoughness of B UEs) Bathing: 3: Mod-Patient completes 5-7 10f 10 parts or 50-74%  FIM - Upper Body Dressing/Undressing Upper body dressing/undressing: 1: Total-Patient completed less than 25% of tasks FIM - Lower Body Dressing/Undressing Lower body dressing/undressing steps patient completed: Thread/unthread right underwear leg, Don/Doff right sock, Don/Doff left sock, Pull pants up/down, Thread/unthread left pants leg, Thread/unthread left underwear leg, Thread/unthread right pants leg, Don/Doff left shoe, Don/Doff right shoe Lower body dressing/undressing: 4: Min-Patient completed 75 plus % of tasks  FIM - Musician Devices: Grab bar or rail for support Toileting: 1: Total-Patient completed zero steps, helper did all 3 (Pulls down brief while holding on to rail for steadying assist.)  FIM - Air cabin crew Transfers: 1-Two helpers (HHA for ambulation; +2 for management of Iv/O2 tank)  FIM - Control and instrumentation engineer Devices: HOB elevated Bed/Chair Transfer: 5: Supine > Sit: Supervision (verbal cues/safety issues) (Use of bed rail for support)  FIM - Locomotion: Wheelchair Locomotion: Wheelchair: 2: Travels 50 - 149 ft with supervision, cueing or  coaxing FIM - Locomotion: Ambulation Locomotion: Ambulation Assistive Devices: Journalist, newspaper Ambulation/Gait Assistance: 4: Min assist, 1: +2 Total  assist (+2 for pole and O2 management) Locomotion: Ambulation: 1: Two helpers  Comprehension Comprehension Mode: Auditory Comprehension: 5-Understands basic 90% of the time/requires cueing < 10% of the time  Expression Expression Mode: Verbal Expression: 5-Expresses basic 90% of the time/requires cueing < 10% of the time.  Social Interaction Social Interaction: 7-Interacts appropriately with others - No medications needed.  Problem Solving Problem Solving: 6-Solves complex problems: With extra time  Memory Memory: 5-Requires cues to use assistive device   Medical Problem List and Plan: 1. Functional deficits secondary to MG crises 2. DVT Prophylaxis/Anticoagulation: Pharmaceutical: Xarelto 3. Pain Management: Tylenol prn 4. Mood: Team to provide ego support. LCSW to follow for evaluation and support.  5. Neuropsych: This patient is capable of making decisions on her own behalf. 6. Skin/Wound Care: routine pressure relief measures. Maintain adequate nutrition and hydration status.  7. Fluids/Electrolytes/Nutrition: Continue TF. Added questran for bulking. Continue water flushes every 4 hours.    8. C diff colitis: On flagyl D# 7/14. added probiotics/fiber. Change formula? 9. A fib: Monitor heart rate every 8 hours. Continue amiodarone and Xarelto.  10. Diarrhea: Exacerbated by tube feeds and c diff. Will add [place] daily. May need to change tube feeds to one with fiber. Will consult RD for input.  11. Hypokalemia: Add supplement. Level slightly improved today.  12. MG crises: On prednisone 60 mg/daily and mestinon 90 mg qid (increased few days PTA). Monitor for recovery past PLEX.  13. Continued oro-pharyngeal dysphagia---NGT for now -I am hopeful that with improving neuro status in other bulbar muscles, we'll see the same here -may have to consider G-tube however 14. Respiratory: BIPAP worked well. On home settings  -nebs prn  -suction, coughing, IS  -oxygen  2-3L    LOS (Days) 3 A FACE TO FACE EVALUATION WAS PERFORMED  Daniela Hernan E 12/20/2014 6:45 AM

## 2014-12-21 ENCOUNTER — Inpatient Hospital Stay (HOSPITAL_COMMUNITY): Payer: PPO | Admitting: *Deleted

## 2014-12-21 LAB — GLUCOSE, CAPILLARY
GLUCOSE-CAPILLARY: 143 mg/dL — AB (ref 70–99)
Glucose-Capillary: 124 mg/dL — ABNORMAL HIGH (ref 70–99)
Glucose-Capillary: 178 mg/dL — ABNORMAL HIGH (ref 70–99)
Glucose-Capillary: 226 mg/dL — ABNORMAL HIGH (ref 70–99)
Glucose-Capillary: 173 mg/dL — ABNORMAL HIGH (ref 70–99)

## 2014-12-21 NOTE — Progress Notes (Signed)
Assisted patient with I.S., able to get to 250. Needs reminders to use I.S. And flutter valve. Scattered rhonchi throughout. Congested, weak cough. O2 at 2L/m via [place]. CPAP at night. Panda tube in place, positive placement checked via auscultation. BLE's with pitting edema. Generalized edema elsewhere. [place] at 70ml/hr with water flushes. Left chest  porta cath accessed. Right IJ cath. Craig [name] A

## 2014-12-21 NOTE — Progress Notes (Signed)
NIF -30, FVC 0.8 with good pt effort.

## 2014-12-21 NOTE — Progress Notes (Signed)
VC 1.2, NIF -35, Good effort by patient.

## 2014-12-21 NOTE — Progress Notes (Signed)
Occupational Therapy Session Note  Patient Details  Name: Craig [name] MRN: [id number] Date of Birth: 12-26-1940  Today's Date: 12/21/2014 OT Individual Time:  -   0900-1000  (60 min)  Short Term Goals: Week 1:  OT Short Term Goal 1 (Week 1): Pt will transfer to BSC/ toilet with supervision OT Short Term Goal 2 (Week 1): Pt will perform 3/3 toileting tasks with steady  A OT Short Term Goal 3 (Week 1): Pt will perform toothbrushing (with suction) with setup OT Short Term Goal 4 (Week 1): Pt will obtain own clothing in prep for ADL tasks with steady A (in standing). OT Short Term Goal 5 (Week 1): Pt will thread LB clothing with setup       Skilled Therapeutic Interventions/Progress Updates:    Skilled OT intervention with treatment focus on the following: functional mobility, transfers, standing balance, endurance, energy conservation.  Pt. Lying in bed.  Went from supine to EOB with SBA plus HOB up and bed rails.  Ambulated to sink to perform standing and sitting activities during bathing and dressing.  Oxygen= 94% through out session with 2liters of o2.  Removed oxygen for 4 minutes and oxygen was 94 %.  Ambulated back to recliner and placed all needs in reach.  Wife present during session.    Therapy Documentation Precautions:  Precautions Precautions: Fall, Other (comment) Precaution Comments: O2 via [place]; NG tube Restrictions Weight Bearing Restrictions: No Other Position/Activity Restrictions: HOB elevated due to continuous tube feeds.      Pain:  none   ADL: ADL ADL Comments: see FIM     See FIM for current functional status  Therapy/Group: Individual Therapy  Lisa Roca 12/21/2014, 7:43 AM

## 2014-12-21 NOTE — Progress Notes (Signed)
[place] PHYSICAL MEDICINE & REHABILITATION     PROGRESS NOTE    Subjective/Complaints: Pt slept well , no issues noted per RN A  review of systems has been performed and if not noted above is otherwise negative.  Objective: Vital Signs: Blood pressure 102/63, pulse 55, temperature 97.9 F (36.6 C), temperature source Oral, resp. rate 18, height 5\' 7"  (1.702 m), weight 105.2 kg (231 lb 14.8 oz), SpO2 97 %. No results found. No results for input(s): WBC, HGB, HCT, PLT in the last 72 hours.  Recent Labs  12/19/14 0615  NA 143  K 3.1*  CL 108  GLUCOSE 169*  BUN 16  CREATININE 0.60  CALCIUM 8.7   CBG (last 3)   Recent Labs  12/20/14 2028 12/20/14 2355 12/21/14 0322  GLUCAP 166* 94 124*    Wt Readings from Last 3 Encounters:  12/21/14 105.2 kg (231 lb 14.8 oz)  12/17/14 107.4 kg (236 lb 12.4 oz)  05/03/10 131.543 kg (290 lb)    Physical Exam:  Nursing note and vitals reviewed. Constitutional: He appears well-developed and well-nourished.  Fatigued. Panda in place left nare.  HENT: BiBap mask secured Head: Normocephalic and atraumatic.  Eyes: Left conjunctiva somewhat injected.  Right eye ptosis  Cardiovascular: Normal rate and regular rhythm.  Respiratory: Effort normal. No respiratory distress. He has no wheezes. He has rhonchi.  GI: Soft. Bowel sounds are normal. He exhibits no distension. There is no tenderness.  Musculoskeletal: He exhibits no edema or tenderness.  Neurological: He is alert.  Facial diplegia.. Followed motor commands without difficulty. Reasonable insight and awareness. Sensory exam intact to PP and LT. Strength 4/5 UE's bilaterally prox to distal. LE's4-/5 hf, 4/5ke, 4 ankles.  Skin: Skin is warm and dry.  Psychiatric: He has a normal mood and affect. His behavior is normal. Good insight and awareness  Assessment/Plan: 1. 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 Physiatrist is providing close team supervision and 24 hour management of active medical problems listed below. Physiatrist and rehab team continue to assess barriers to discharge/monitor patient progress toward functional and medical goals. FIM: FIM - Bathing Bathing Steps Patient Completed: Chest, Abdomen, Right upper leg, Left upper leg, Front perineal area (Pt required assist for thoughness of B UEs) Bathing: 3: Mod-Patient completes 5-7 55f 10 parts or 50-74%  FIM - Upper Body Dressing/Undressing Upper body dressing/undressing: 1: Total-Patient completed less than 25% of tasks FIM - Lower Body Dressing/Undressing Lower body dressing/undressing steps patient completed: Thread/unthread right underwear leg, Don/Doff right sock, Don/Doff left sock, Pull pants up/down, Thread/unthread left pants leg, Thread/unthread left underwear leg, Thread/unthread right pants leg, Don/Doff left shoe, Don/Doff right shoe Lower body dressing/undressing: 4: Min-Patient completed 75 plus % of tasks  FIM - Musician Devices: Grab bar or rail for support Toileting: 1: Total-Patient completed zero steps, helper did all 3 (Pulls down brief while holding on to rail for steadying assist.)  FIM - Air cabin crew Transfers: 1-Two helpers (HHA for ambulation; +2 for management of Iv/O2 tank)  FIM - Control and instrumentation engineer Devices: HOB elevated, Bed rails, Arm rests, Aeronautical engineer Transfer: 5: Supine > Sit: Supervision (verbal cues/safety issues), 4: Bed > Chair or W/C: Min A (steadying Pt. > 75%), 4: Chair or W/C > Bed: Min A (steadying Pt. > 75%)  FIM - Locomotion: Wheelchair Locomotion: Wheelchair: 5: Travels 150 ft or more: maneuvers  on rugs and over door sills with supervision, cueing or coaxing FIM - Locomotion: Ambulation Locomotion: Ambulation Assistive Devices: Research scientist (physical sciences) Ambulation/Gait Assistance: 4: Min assist, 1: +2 Total assist (+2 for management of IV pole) Locomotion: Ambulation: 1: Two helpers  Comprehension Comprehension Mode: Auditory Comprehension: 5-Understands basic 90% of the time/requires cueing < 10% of the time  Expression Expression Mode: Verbal Expression: 5-Expresses basic 90% of the time/requires cueing < 10% of the time.  Social Interaction Social Interaction: 6-Interacts appropriately with others with medication or extra time (anti-anxiety, antidepressant).  Problem Solving Problem Solving: 5-Solves basic 90% of the time/requires cueing < 10% of the time  Memory Memory: 5-Requires cues to use assistive device   Medical Problem List and Plan: 1. Functional deficits secondary to MG crises 2. DVT Prophylaxis/Anticoagulation: Pharmaceutical: Xarelto 3. Pain Management: Tylenol prn 4. Mood: Team to provide ego support. LCSW to follow for evaluation and support.  5. Neuropsych: This patient is capable of making decisions on her own behalf. 6. Skin/Wound Care: routine pressure relief measures. Maintain adequate nutrition and hydration status.  7. Fluids/Electrolytes/Nutrition: Continue TF. Added questran for bulking. Continue water flushes every 4 hours.    8. C diff colitis: On flagyl D# 9/14. added probiotics/fiber. Change formula? 9. A fib: Monitor heart rate every 8 hours. Continue amiodarone and Xarelto.  10. Diarrhea: Exacerbated by tube feeds and c diff. Will add [place] daily. May need to change tube feeds to one with fiber. Will consult RD for input.  11. Hypokalemia: Add supplement. Level slightly improved today.  12. MG crises: On prednisone 60 mg/daily and mestinon 90 mg qid (increased few days PTA). Monitor for recovery past PLEX.  13. Continued oro-pharyngeal dysphagia---NGT for now -I am hopeful that with improving neuro status in other bulbar muscles, -may have to consider G-tube however 14.  Respiratory: BIPAP worked well. On home settings  -nebs prn  -suction, coughing, IS  -oxygen 2-3L    LOS (Days) 4 A FACE TO FACE EVALUATION WAS PERFORMED  Charlett Blake 12/21/2014 7:57 AM

## 2014-12-22 ENCOUNTER — Inpatient Hospital Stay (HOSPITAL_COMMUNITY): Payer: PPO

## 2014-12-22 ENCOUNTER — Encounter (HOSPITAL_COMMUNITY): Payer: PPO

## 2014-12-22 ENCOUNTER — Inpatient Hospital Stay (HOSPITAL_COMMUNITY): Payer: PPO | Admitting: Speech Pathology

## 2014-12-22 LAB — GLUCOSE, CAPILLARY
Glucose-Capillary: 106 mg/dL — ABNORMAL HIGH (ref 70–99)
Glucose-Capillary: 126 mg/dL — ABNORMAL HIGH (ref 70–99)
Glucose-Capillary: 160 mg/dL — ABNORMAL HIGH (ref 70–99)

## 2014-12-22 MED ORDER — GUAIFENESIN-DM 100-10 MG/5ML PO SYRP: 10.0000 mL | ORAL_SOLUTION | ORAL | Status: DC | PRN

## 2014-12-22 MED ORDER — ALBUTEROL SULFATE (2.5 MG/3ML) 0.083% IN NEBU
2.5000 mg | INHALATION_SOLUTION | RESPIRATORY_TRACT | Status: DC | PRN
  Administered 2014-12-22: 2.5 mg via RESPIRATORY_TRACT
  Filled 2014-12-22 (×2): qty 3

## 2014-12-22 MED ORDER — GUAIFENESIN-DM 100-10 MG/5ML PO SYRP
5.0000 mL | ORAL_SOLUTION | ORAL | Status: DC | PRN
  Administered 2014-12-22 – 2014-12-25 (×7): 5 mL
  Filled 2014-12-22 (×7): qty 5

## 2014-12-22 MED ORDER — ALBUTEROL SULFATE (2.5 MG/3ML) 0.083% IN NEBU
2.5000 mg | INHALATION_SOLUTION | Freq: Two times a day (BID) | RESPIRATORY_TRACT | Status: DC
  Administered 2014-12-22 – 2014-12-24 (×5): 2.5 mg via RESPIRATORY_TRACT
  Filled 2014-12-22 (×5): qty 3

## 2014-12-22 MED ORDER — PHENOL 1.4 % MT LIQD
1.0000 | OROMUCOSAL | Status: DC | PRN
  Filled 2014-12-22: qty 177

## 2014-12-22 NOTE — Progress Notes (Addendum)
Occupational Therapy Session Note Patient Details  Name: Craig [name] MRN: [id number] Date of Birth: 1941-09-12  Today's Date: 12/22/2014 OT Individual Time: 0800-0900 OT Individual Time Calculation (min): 60 min    Short Term Goals: Week 1: OT Short Term Goal 1 (Week 1): Pt will transfer to BSC/ toilet with supervision OT Short Term Goal 2 (Week 1): Pt will perform 3/3 toileting tasks with steady A OT Short Term Goal 3 (Week 1): Pt will perform toothbrushing (with suction) with setup OT Short Term Goal 4 (Week 1): Pt will obtain own clothing in prep for ADL tasks with steady A (in standing). OT Short Term Goal 5 (Week 1): Pt will thread LB clothing with setup   Skilled Therapeutic Interventions/Progress Updates:   Pt in bed with HOB elevated upon arrival. Pt transferred supine>EOB with supervision and HOB raised, and  EOB>w/c with supervision. He completed UB bathing task seated at sink with set-up, and bathed LB with min assist to wash feet. Pt able to thread B UE into shirt, however, required mod A to pull shirt overhead/manage shirt 2/2 tube management. Pt dressed LB with min A with use of small step stool to place B feet upon for easier access during LB dressing task. He was able to thread L LE into underwear and pants, requiring min A to thread R LE, stating he felt weak in his R LE, and was unable to lift LE in order to thread independently. Pt demonstrated good static and dynamic functional standing balance to pull underwear/pants up with supervision. Pt requested therapist complete oral hygiene routine with use of suction tooth brush.  Pt ambulated from sink> recliner, ~10 ft, with use of [place] and close supervision.  Kinesiotape applied to back of pt's neck to facilitate neck extension needed for good  functional posture. Pt reported good feel and fit of tape.  Pt's O2 level remained 97% on 2L O2 throughout session.  Pt reported discomfort in back of R calf. Limb not warm to touch.  Pt reported pain had deminshed by end of session.  Pt educated upon dressing order for R weaker side, and cues for maintaining good functional posture.  Pt left sitting up in recliner with all needs in reach and wife present at end of session.   Therapy Documentation Precautions:  Precautions Precautions: Fall, Other (comment) Precaution Comments: O2 via [place]; NG tube Restrictions Weight Bearing Restrictions: No Other Position/Activity Restrictions: HOB elevated due to continuous tube feeds.      Pain: Pain Assessment Pain Assessment: No/denies pain ADL: ADL ADL Comments: see FIM       See FIM for current functional status  Therapy/Group: Individual Therapy   Klohe Lovering Kailan, OTR/L

## 2014-12-22 NOTE — Progress Notes (Signed)
Physical Therapy Session Note  Patient Details  Name: Craig [name] MRN: [id number] Date of Birth: 1941/01/09  Today's Date: 12/22/2014 PT Individual Time: 1030-1130 PT Individual Time Calculation (min): 60 min   Short Term Goals: Week 1:  PT Short Term Goal 1 (Week 1): Pt will be able to demonstrate bed mobility with S PT Short Term Goal 2 (Week 1): Pt will be able to transfer with S for basic surfaces PT Short Term Goal 3 (Week 1): Pt will be able to go up/down 3 steps with 1 rail (R) with min A  Skilled Therapeutic Interventions/Progress Updates:   Co-treatment with recreational therapy with focus on addressing overall activity tolerance and endurance, functional standing balance and endurance activities with focus on activating upright trunk and head as well as functional strengthening and use of BUE (reaching for horseshoes outside BOS and placing on basketball hoop), dynamic gait through obstacle course to simulate community mobility and stair negotiation for home entry using [facility] for gait, and gait training with SPC. Pt performed all mobility and balance at steady to min A level using SPC appropriately. During dynamic gait obstacle course x 2 LOB with min A to recover and cues to slow down for safety especially during turns. Improved ability to perform stairs with 1 rail (used SPC in other hand) to simulate home entry. Rest breaks often due to decreased endurance and activity tolerance. See vitals below for O2 sats monitored throughout session no 1-2 L O2.   Pt disconnected from PEG tube feeds during this session.   Therapy Documentation Precautions:  Precautions Precautions: Fall, Other (comment) Precaution Comments: O2 via [place]; NG tube Restrictions Weight Bearing Restrictions: No Other Position/Activity Restrictions: HOB elevated due to continuous tube feeds.   Vital Signs: 2L O2 initially >95% with activity. Trialled on 1 L of O2 and remained > 94% with standing activity. Dropped  to 88% with gait but returned back to 93% within 20 seconds with cues for pursed lip breathing.  Pain:  Denies pain.   See FIM for current functional status  Therapy/Group: Individual Therapy and Co-Treatment with Recreational Therapy  Craig [name] [facility] 12/22/2014, 11:57 AM

## 2014-12-22 NOTE — Progress Notes (Signed)
Physical Therapy Session Note  Patient Details  Name: Craig [name] MRN: [id number] Date of Birth: 1941-10-13  Today's Date: 12/22/2014 PT Individual Time: 1500-1530 PT Individual Time Calculation (min): 30 min   Short Term Goals: Week 1:  PT Short Term Goal 1 (Week 1): Pt will be able to demonstrate bed mobility with S PT Short Term Goal 2 (Week 1): Pt will be able to transfer with S for basic surfaces PT Short Term Goal 3 (Week 1): Pt will be able to go up/down 3 steps with 1 rail (R) with min A  Skilled Therapeutic Interventions/Progress Updates:   Pt on [facility] upon therapist entering room. Assisted with hygiene but pt able to pull up pants and underwear with min A for balance and transfer back to recliner. Pt fatigued from the day. Focused on seated and standing therex for functional strengthening including heel/toe raises, LAQ with 3 second hold, standing marches, mini squats and standing hip abduction x 10 -15 reps each BLE. S to steady A for sit to stands throughout session. All needs in place at end of session with pt's wife in the room.  Therapy Documentation Precautions:  Precautions Precautions: Fall, Other (comment) Precaution Comments: O2 via [place]; NG tube Restrictions Weight Bearing Restrictions: No Other Position/Activity Restrictions: HOB elevated due to continuous tube feeds.  Pain: Pain Assessment Pain Assessment: No/denies pain  See FIM for current functional status  Therapy/Group: Individual Therapy  [name] [facility] 12/22/2014, 4:02 PM

## 2014-12-22 NOTE — Progress Notes (Signed)
Recreational Therapy Assessment and Plan  Patient Details  Name: Craig [name] MRN: [id number] Date of Birth: 1941/08/21 Today's Date: 12/22/2014  Rehab Potential: Good ELOS: 2 weeks   AssessmentProblem List:  Patient Active Problem List   Diagnosis Date Noted  . MG, crisis (myasthenia gravis) 12/17/2014  . Dysphagia, pharyngoesophageal phase   . Enteritis due to Clostridium difficile   . Clostridium difficile diarrhea 12/15/2014  . Hypernatremia   . Acute exacerbation of myasthenia gravis   . Myasthenia gravis in crisis 12/05/2014  . Bladder cancer 12/05/2014  . HTN (hypertension) 12/05/2014  . A-fib 12/05/2014  . Chronic anticoagulation 12/05/2014  . Acute respiratory failure with hypoxia 12/05/2014    Past Medical History:  Past Medical History  Diagnosis Date  . Atrial fibrillation, chronic     on Xarelto   . HTN (hypertension)   . CHF (congestive heart failure)   . Diabetes mellitus   . 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 Abdominal wall metastasis removal      Assessment & Plan Clinical Impression: Patient is a 74 y.o. year old male with recent admission to the hospital with history of A fib, metastatic bladder cancer, recent PNA, MG who has had worsening over past months. He was evaluated by neurology 3 days PTA and was placed on steroids with increase in mestinon due to difficulty swallowing and SOB. Patient continued to worsen and was admitted via [place] on 12/05/14 with SOB, swallowing difficulty, difficulty hold head up with droopy eyelids and congestion. s. He was intubated due to respiratory distress and started on IV solumedrol as well as plasmapheresis per input by Dr. Doy Mince. He has responded to treatment and tolerated extubation on 01/25 with use of BIPAP  prn. He is able to suction oral secretions and is currently NPO with tube feeds for nutritional support. He was treated with PLEX X 6 days---last on 12/15/14 with reports of improvement in SOB. Neurology recommends IVIG X 3 if patient declines or does not show further improvement. ST has been working of trials of ice chips. NIF/VC not checked in past couple of days as equipment unavailable. He was started on flagyl for c diff colitis with diarrhea and completes treatment on 02/10. Reactive leucocytosis is resolving. Hypernatremia and hypokalemia resolving with treatment and Xarelto resumed. Therapy ongoing with recommendations for CIR. Patient felt to have completed his therapy for MG crises and cleared for comprehensive inpatient rehab program. Patient transferred to [place] on 12/17/2014.   Pt presents with decreased activity tolerance, decreased oxygen support, decreased functional mobility, decreased balance Limiting pt's independence with leisure/community pursuits.   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 - Woodworking Premorbid leisure interest/no interest in trying: Joretta Bachelor care Other Leisure Interests: Television Leisure Participation Style: With Family/Friends Awareness of Community Resources: Good-identify 3 post discharge leisure resources Psychosocial / Spiritual Does patient have pets?: No Social interaction - Mood/Behavior: Cooperative Engineer, drilling for Education?: Yes Recreational Therapy Orientation Orientation -Reviewed with patient: Available activity resources Strengths/Weaknesses Patient Strengths/Abilities: Willingness to participate;Active premorbidly Patient weaknesses: Physical limitations TR Patient demonstrates impairments in the following area(s): Endurance;Motor;Sensory;Safety  Plan Rec Therapy Plan Is patient appropriate for Therapeutic  Recreation?: Yes Rehab Potential: Good Treatment times per week: Min 1 time per week >20 minutes Estimated Length of Stay: 2 weeks TR Treatment/Interventions: Adaptive equipment instruction;1:1  session;Balance/vestibular training;Functional mobility training;Community reintegration;Patient/family education;Therapeutic activities;Recreation/leisure participation;Therapeutic exercise;UE/LE Coordination activities;Wheelchair propulsion/positioning  Recommendations for other services: None  Discharge Criteria: Patient will be discharged from TR if patient refuses treatment 3 consecutive times without medical reason.  If treatment goals not met, if there is a change in medical status, if patient makes no progress towards goals or if patient is discharged from hospital.  The above assessment, treatment plan, treatment alternatives and goals were discussed and mutually agreed upon: by patient  [place] 12/22/2014, 3:00 PM

## 2014-12-22 NOTE — Progress Notes (Signed)
NIF -60. VC .70 with good patient effort.

## 2014-12-22 NOTE — Progress Notes (Signed)
Pt asked RT to come back at 2200 to place on CPAP. When RT arrived patient was not ready. Pt stated he would have the nurse call. RT will continue to monitor.

## 2014-12-22 NOTE — Progress Notes (Signed)
[place] PHYSICAL MEDICINE & REHABILITATION     PROGRESS NOTE    Subjective/Complaints: Sleeping better. Pain controlled. Feels that speech is stronger.throat is sore A  review of systems has been performed and if not noted above is otherwise negative.  Objective: Vital Signs: Blood pressure 104/45, pulse 61, temperature 98 F (36.7 C), temperature source Axillary, resp. rate 18, height 5\' 7"  (1.702 m), weight 106.1 kg (233 lb 14.5 oz), SpO2 96 %. No results found. No results for input(s): WBC, HGB, HCT, PLT in the last 72 hours. No results for input(s): NA, K, CL, GLUCOSE, BUN, CREATININE, CALCIUM in the last 72 hours.  Invalid input(s): CO CBG (last 3)   Recent Labs  12/21/14 2010 12/22/14 0004 12/22/14 0409  GLUCAP 173* 106* 160*    Wt Readings from Last 3 Encounters:  12/22/14 106.1 kg (233 lb 14.5 oz)  12/17/14 107.4 kg (236 lb 12.4 oz)  05/03/10 131.543 kg (290 lb)    Physical Exam:  Nursing note and vitals reviewed. Constitutional: He appears well-developed and well-nourished.  Panda in place left nare.  HENT: BiBap mask secured Head: Normocephalic and atraumatic.  Eyes: Left conjunctiva somewhat injected.  Ptosis better  Cardiovascular: Normal rate and regular rhythm.  Respiratory: Effort normal. No respiratory distress. He has no wheezes. He has rhonchi.  GI: Soft. Bowel sounds are normal. He exhibits no distension. There is no tenderness.  Musculoskeletal: He exhibits no edema or tenderness.  Neurological: He is alert.  Facial diplegia improving. Cough and oro-motor control is better.. Followed motor commands without difficulty. Reasonable insight and awareness. Sensory exam intact to PP and LT. Strength 4/5 UE's bilaterally prox to distal. LE's4-/5 hf, 4/5ke, 4 ankles.  Skin: Skin is warm and dry.  Psychiatric: He has a normal mood and affect. His behavior is normal. Good insight and awareness  Assessment/Plan: 1. Functional deficits  secondary to Myasthenia Gravis Crisis which require 3+ hours per day of interdisciplinary therapy in a comprehensive inpatient rehab setting. Physiatrist is providing close team supervision and 24 hour management of active medical problems listed below. Physiatrist and rehab team continue to assess barriers to discharge/monitor patient progress toward functional and medical goals. FIM: FIM - Bathing Bathing Steps Patient Completed: Chest, Abdomen, Right upper leg, Left upper leg, Front perineal area, Right Arm, Left Arm Bathing: 3: Mod-Patient completes 5-7 50f 10 parts or 50-74%  FIM - Upper Body Dressing/Undressing Upper body dressing/undressing: 0: Wears gown/pajamas-no public clothing FIM - Lower Body Dressing/Undressing Lower body dressing/undressing steps patient completed: Thread/unthread right underwear leg, Don/Doff right sock, Don/Doff left sock, Pull pants up/down, Thread/unthread left pants leg, Thread/unthread left underwear leg, Thread/unthread right pants leg, Don/Doff left shoe, Don/Doff right shoe Lower body dressing/undressing: 0: Wears gown/pajamas-no public clothing  FIM - Toileting Toileting steps completed by patient: Performs perineal hygiene Toileting Assistive Devices: Grab bar or rail for support Toileting: 2: Max-Patient completed 1 of 3 steps  FIM - Air cabin crew Transfers: 1-Two helpers (HHA for ambulation; +2 for management of Iv/O2 tank)  FIM - Engineer, site Assistive Devices: HOB elevated, Bed rails, Arm rests, Aeronautical engineer Transfer: 5: Supine > Sit: Supervision (verbal cues/safety issues), 4: Bed > Chair or W/C: Min A (steadying Pt. > 75%), 4: Chair or W/C > Bed: Min A (steadying Pt. > 75%)  FIM - Locomotion: Wheelchair Locomotion: Wheelchair: 5: Travels 150 ft or more: maneuvers on rugs and over door sills with supervision, cueing or coaxing FIM - Locomotion: Ambulation Locomotion: Ambulation  Assistive Devices: Research scientist (physical sciences) Ambulation/Gait Assistance: 4: Min assist, 1: +2 Total assist (+2 for management of IV pole) Locomotion: Ambulation: 1: Two helpers  Comprehension Comprehension Mode: Auditory Comprehension: 5-Understands complex 90% of the time/Cues < 10% of the time  Expression Expression Mode: Verbal Expression: 5-Expresses basic 90% of the time/requires cueing < 10% of the time.  Social Interaction Social Interaction: 6-Interacts appropriately with others with medication or extra time (anti-anxiety, antidepressant).  Problem Solving Problem Solving: 5-Solves complex 90% of the time/cues < 10% of the time  Memory Memory: 5-Requires cues to use assistive device   Medical Problem List and Plan: 1. Functional deficits secondary to MG crises 2. DVT Prophylaxis/Anticoagulation: Pharmaceutical: Xarelto 3. Pain Management: Tylenol prn 4. Mood: Team to provide ego support. LCSW to follow for evaluation and support.  5. Neuropsych: This patient is capable of making decisions on her own behalf. 6. Skin/Wound Care: routine pressure relief measures. Maintain adequate nutrition and hydration status.  7. Fluids/Electrolytes/Nutrition: Continue TF. Added questran for bulking. Continue water flushes every 4 hours.    8. C diff colitis: On flagyl D# 12/14. added probiotics/fiber. diarrhea 9. A fib: Monitor heart rate every 8 hours. Continue amiodarone and Xarelto.  10. Diarrhea: Exacerbated by tube feeds and c diff. Will add [place] daily. May need to change tube feeds to one with fiber. Will consult RD for input.  11. Hypokalemia: Add supplement. Level slightly improved today.  12. MG crises: On prednisone 60 mg/daily and mestinon 90 mg qid (increased few days PTA). Monitor for recovery past PLEX.  13. Continued oro-pharyngeal dysphagia---NGT for now -continue work with SLP---improvement, but still may need PEG 14. Respiratory: BIPAP working well. On home settings  -nebs prn  -suction,  coughing, IS  -oxygen 2-3L    LOS (Days) 5 A FACE TO FACE EVALUATION WAS PERFORMED  SWARTZ,ZACHARY T 12/22/2014 8:00 AM

## 2014-12-22 NOTE — Progress Notes (Signed)
Speech Language Pathology Daily Session Note  Patient Details  Name: Craig [name] MRN: [id number] Date of Birth: 1941-08-31  Today's Date: 12/22/2014 SLP Individual Time: 1300-1330 SLP Individual Time Calculation (min): 30 min  Short Term Goals: Week 1: SLP Short Term Goal 1 (Week 1): Patient will keep his oral cavity closed to increase mandibular strength and overall breath support for 60 seconds at a time in 50% of opportunities with Mod A multimodal cues.  SLP Short Term Goal 2 (Week 1): Patient will utilize diaphragmatic breathing to increase vocal intensity and speech intelligibility at the phrase level  to 90% accuracy with Supervision multimodal cues.  SLP Short Term Goal 3 (Week 1): Patient will consume trials of ice chips with minimal overt s/s of aspiration with 50% of trials with Min A multimodal cues.   Skilled Therapeutic Interventions: Skilled treatment session focused on dysphagia goals. Student facilitated session by presenting PO trials of ice chips following oral care. Patient was able to self-feed in 20% of instances. Patient demonstrated overt s/s of aspiration with 50% of trials, with overt s/s of aspiration increasing as trials progressed, suspect due to fatigue. Patient presented with less overt s/s of aspiration after brief rest period. Student also facilitated session by providing Supervision A for keeping oral cavity closed during therapy session and Min A multimodal cues to increase vocal intensity to maintain approximately 90% intelligibility at the phrase level in a quiet environment. Patient left in recliner with wife present and all needs within reach. Continue with current plan of care.   FIM:  Comprehension Comprehension Mode: Auditory Comprehension: 6-Follows complex conversation/direction: With extra time/assistive device Expression Expression Mode: Verbal Expression: 5-Expresses complex 90% of the time/cues < 10% of the time Social Interaction Social  Interaction: 6-Interacts appropriately with others with medication or extra time (anti-anxiety, antidepressant). Problem Solving Problem Solving: 6-Solves complex problems: With extra time Memory Memory: 6-More than reasonable amt of time  Pain    Therapy/Group: Individual Therapy  [name] 12/22/2014, 5:04 PM

## 2014-12-22 NOTE — Progress Notes (Signed)
Patient had a NIF of -24cm and VC 900cc with good patient effort

## 2014-12-23 ENCOUNTER — Encounter (HOSPITAL_COMMUNITY): Payer: PPO

## 2014-12-23 ENCOUNTER — Inpatient Hospital Stay (HOSPITAL_COMMUNITY): Payer: PPO

## 2014-12-23 ENCOUNTER — Inpatient Hospital Stay (HOSPITAL_COMMUNITY): Payer: PPO | Admitting: Speech Pathology

## 2014-12-23 LAB — GLUCOSE, CAPILLARY
GLUCOSE-CAPILLARY: 165 mg/dL — AB (ref 70–99)
GLUCOSE-CAPILLARY: 164 mg/dL — AB (ref 70–99)
Glucose-Capillary: 235 mg/dL — ABNORMAL HIGH (ref 70–99)
Glucose-Capillary: 187 mg/dL — ABNORMAL HIGH (ref 70–99)
Glucose-Capillary: 125 mg/dL — ABNORMAL HIGH (ref 70–99)
Glucose-Capillary: 241 mg/dL — ABNORMAL HIGH (ref 70–99)
Glucose-Capillary: 151 mg/dL — ABNORMAL HIGH (ref 70–99)

## 2014-12-23 NOTE — Progress Notes (Signed)
NIF -60cm, VC 1.55liters post albuterol tx. Great patient effort

## 2014-12-23 NOTE — Progress Notes (Signed)
RT note-Placed on cpap at this time, with 2 l/min of oxygen in line.

## 2014-12-23 NOTE — Progress Notes (Signed)
[place] PHYSICAL MEDICINE & REHABILITATION     PROGRESS NOTE    Subjective/Complaints: Had another good night. Breathing better. Less secretions. bipap working well A  review of systems has been performed and if not noted above is otherwise negative.  Objective: Vital Signs: Blood pressure 103/57, pulse 73, temperature 98 F (36.7 C), temperature source Axillary, resp. rate 18, height 5\' 7"  (1.702 m), weight 103.6 kg (228 lb 6.3 oz), SpO2 97 %. No results found. No results for input(s): WBC, HGB, HCT, PLT in the last 72 hours. No results for input(s): NA, K, CL, GLUCOSE, BUN, CREATININE, CALCIUM in the last 72 hours.  Invalid input(s): CO CBG (last 3)   Recent Labs  12/22/14 0004 12/22/14 0409 12/22/14 0806  GLUCAP 106* 160* 126*    Wt Readings from Last 3 Encounters:  12/23/14 103.6 kg (228 lb 6.3 oz)  12/17/14 107.4 kg (236 lb 12.4 oz)  05/03/10 131.543 kg (290 lb)    Physical Exam:  Nursing note and vitals reviewed. Constitutional: He appears well-developed and well-nourished.  Panda in place left nare.  HENT: BiBap mask secured Head: Normocephalic and atraumatic.  Eyes: Left conjunctiva somewhat injected.  Ptosis better  Cardiovascular: Normal rate and regular rhythm.  Respiratory: Effort normal. No respiratory distress. He has no wheezes. He has rhonchi.  GI: Soft. Bowel sounds are normal. He exhibits no distension. There is no tenderness.  Musculoskeletal: He exhibits no edema or tenderness.  Neurological: He is alert.  Facial diplegia improving. Cough and oro-motor control is better.. Followed motor commands without difficulty. Reasonable insight and awareness. Sensory exam intact to PP and LT. Strength 4/5 UE's bilaterally prox to distal. LE's4-/5 hf, 4/5ke, 4 ankles.  Skin: Skin is warm and dry.  Psychiatric: He has a normal mood and affect. His behavior is normal. Good insight and awareness  Assessment/Plan: 1. Functional deficits  secondary to Myasthenia Gravis Crisis which require 3+ hours per day of interdisciplinary therapy in a comprehensive inpatient rehab setting. Physiatrist is providing close team supervision and 24 hour management of active medical problems listed below. Physiatrist and rehab team continue to assess barriers to discharge/monitor patient progress toward functional and medical goals. FIM: FIM - Bathing Bathing Steps Patient Completed: Chest, Right Arm, Left Arm, Abdomen, Front perineal area, Right upper leg, Left upper leg Bathing: 3: Mod-Patient completes 5-7 12f 10 parts or 50-74%  FIM - Upper Body Dressing/Undressing Upper body dressing/undressing steps patient completed: Thread/unthread left sleeve of pullover shirt/dress, Put head through opening of pull over shirt/dress, Thread/unthread right sleeve of pullover shirt/dresss, Pull shirt over trunk Upper body dressing/undressing: 3: Mod-Patient completed 50-74% of tasks FIM - Lower Body Dressing/Undressing Lower body dressing/undressing steps patient completed: Thread/unthread left underwear leg, Pull underwear up/down, Thread/unthread left pants leg, Pull pants up/down, Don/Doff right sock, Don/Doff left sock, Don/Doff right shoe, Don/Doff left shoe, Fasten/unfasten left shoe Lower body dressing/undressing: 4: Min-Patient completed 75 plus % of tasks  FIM - Toileting Toileting steps completed by patient: Adjust clothing prior to toileting, Adjust clothing after toileting Toileting Assistive Devices: Grab bar or rail for support Toileting: 3: Mod-Patient completed 2 of 3 steps  FIM - Radio producer Devices: Recruitment consultant Transfers: 4-From toilet/BSC: Min A (steadying Pt. > 75%)  FIM - Bed/Chair Transfer Bed/Chair Transfer Assistive Devices: HOB elevated Bed/Chair Transfer: 5: Supine > Sit: Supervision (verbal cues/safety issues), 5: Bed > Chair or W/C: Supervision (verbal cues/safety issues)  FIM -  Locomotion: Wheelchair Locomotion: Wheelchair: 1:  Total Assistance/staff pushes wheelchair (Pt<25%) FIM - Locomotion: Ambulation Locomotion: Ambulation Assistive Devices: Cane - Straight Ambulation/Gait Assistance: 4: Min assist, 1: +2 Total assist (+2 for management of IV pole) Locomotion: Ambulation: 2: Travels 50 - 149 ft with minimal assistance (Pt.>75%)  Comprehension Comprehension Mode: Auditory Comprehension: 5-Understands complex 90% of the time/Cues < 10% of the time  Expression Expression Mode: Verbal Expression: 5-Expresses basic 90% of the time/requires cueing < 10% of the time.  Social Interaction Social Interaction: 6-Interacts appropriately with others with medication or extra time (anti-anxiety, antidepressant).  Problem Solving Problem Solving: 5-Solves complex 90% of the time/cues < 10% of the time  Memory Memory: 5-Requires cues to use assistive device   Medical Problem List and Plan: 1. Functional deficits secondary to MG crises 2. DVT Prophylaxis/Anticoagulation: Pharmaceutical: Xarelto 3. Pain Management: Tylenol prn 4. Mood: Team to provide ego support. LCSW to follow for evaluation and support.  5. Neuropsych: This patient is capable of making decisions on her own behalf. 6. Skin/Wound Care: routine pressure relief measures. Maintain adequate nutrition and hydration status.  7. Fluids/Electrolytes/Nutrition: Continue TF. Added questran for bulking. Continue water flushes every 4 hours.    8. C diff colitis: On flagyl D# 13/14. added probiotics/fiber. diarrhea 9. A fib: Monitor heart rate every 8 hours. Continue amiodarone and Xarelto.  10. Diarrhea: Exacerbated by tube feeds and c diff. Will add [place] daily. May need to change tube feeds to one with fiber. Will consult RD for input.  11. Hypokalemia: Add supplement. Level slightly improved today.  12. MG crises: On prednisone 60 mg/daily and mestinon 90 mg qid (increased few days PTA).  Monitor for recovery past PLEX.  13. Continued oro-pharyngeal dysphagia---NGT for now -continue work with SLP---improvement, but still may need PEG 14. Respiratory: BIPAP working well. On home settings  -nebs prn  -suction, coughing, IS  -oxygen 2-3L, decrease as possible    LOS (Days) 6 A FACE TO FACE EVALUATION WAS PERFORMED  SWARTZ,ZACHARY T 12/23/2014 7:48 AM

## 2014-12-23 NOTE — Progress Notes (Signed)
Speech Language Pathology Daily Session Note  Patient Details  Name: Craig [name] MRN: [id number] Date of Birth: 11/05/1941  Today's Date: 12/23/2014 SLP Individual Time: 1300-1400 SLP Individual Time Calculation (min): 60 min  Short Term Goals: Week 1: SLP Short Term Goal 1 (Week 1): Patient will keep his oral cavity closed to increase mandibular strength and overall breath support for 60 seconds at a time in 50% of opportunities with Mod A multimodal cues.  SLP Short Term Goal 2 (Week 1): Patient will utilize diaphragmatic breathing to increase vocal intensity and speech intelligibility at the phrase level  to 90% accuracy with Supervision multimodal cues.  SLP Short Term Goal 3 (Week 1): Patient will consume trials of ice chips with minimal overt s/s of aspiration with 50% of trials with Min A multimodal cues.   Skilled Therapeutic Interventions:  Pt was seen for skilled speech therapy targeting goals for speech intelligibility and dysphagia.  Upon arrival, pt was seated upright in recliner, awake, lethargic, but agreeable to participate in therapy.  Pt was noted with increased non-productive cough prior to initiation of PO trials.  Made RN aware who administered medications during sessions.  SLP facilitated the session with trials of ice chips to continue working towards initiation of PO diet.  Pt was noted with frequent cough although it was difficult to determine whether cough was directly related to PO intake or if it was the same non-productive cough that was noted prior to trials as frequency of coughing did not appear to increase or decrease with intake.  SLP also facilitated the session with mod multimodal cues for return demonstration of diaphragmatic breathing techniques to improve vocal intensity for speech intelligibility.  Pt then utilized the abovementioned techniques with supervision to improve vocal intensity during structured automatic sequences over three consecutive trials.   Continue per current plan of care.     FIM:  Comprehension Comprehension Mode: Auditory Comprehension: 5-Understands complex 90% of the time/Cues < 10% of the time Expression Expression Mode: Verbal Expression: 5-Expresses basic 90% of the time/requires cueing < 10% of the time. Social Interaction Social Interaction: 6-Interacts appropriately with others with medication or extra time (anti-anxiety, antidepressant). Problem Solving Problem Solving: 5-Solves complex 90% of the time/cues < 10% of the time Memory Memory: 5-Requires cues to use assistive device FIM - Eating Eating Activity: 1: Helper feeds patient  Pain Pain Assessment Pain Assessment: No/denies pain  Therapy/Group: Individual Therapy  Dalten Ambrosino, Selinda Orion 12/23/2014, 3:13 PM

## 2014-12-23 NOTE — Progress Notes (Signed)
Occupational Therapy Session Note  Patient Details  Name: Ulis Kaps MRN: [id number] Date of Birth: 1941/09/16  Today's Date: 12/23/2014 OT Individual Time: 0800-0900 OT Individual Time Calculation (min): 60 min    Short Term Goals: Week 1:  OT Short Term Goal 1 (Week 1): Pt will transfer to BSC/ toilet with supervision OT Short Term Goal 2 (Week 1): Pt will perform 3/3 toileting tasks with steady  A OT Short Term Goal 3 (Week 1): Pt will perform toothbrushing (with suction) with setup OT Short Term Goal 4 (Week 1): Pt will obtain own clothing in prep for ADL tasks with steady A (in standing). OT Short Term Goal 5 (Week 1): Pt will thread LB clothing with setup   Skilled Therapeutic Interventions/Progress Updates:    Pt engaged in BADL retraining including bathing and dressing with sit<>stand from w/c at sink. Pt amb with SPC from bed to sink to complete BADLs.  Pt utilized stool to prop feet on for bathing and dressing tasks. Pt required assistance with fastening shoes.  Wife was present during session and provided appropriate assistance/supervision.  Pt amb with SPC to recliner at end of session.  Focus on activity tolerance, sit<>stand, bed mobility, standing balance, BADLs, and safety awareness.  Therapy Documentation Precautions:  Precautions Precautions: Fall, Other (comment) Precaution Comments: O2 via ; NG tube Restrictions Weight Bearing Restrictions: No Other Position/Activity Restrictions: HOB elevated due to continuous tube feeds. Pain: Pain Assessment Pain Assessment: No/denies pain ADL: ADL ADL Comments: see FIM  See FIM for current functional status  Therapy/Group: Individual Therapy  Leroy Libman 12/23/2014, 9:00 AM

## 2014-12-23 NOTE — Progress Notes (Signed)
Pt placed on CPAP. Tolerating well at this time.

## 2014-12-23 NOTE — Progress Notes (Signed)
NIF -60 VC .7L with good patient effort.

## 2014-12-23 NOTE — Progress Notes (Signed)
Physical Therapy Session Note  Patient Details  Name: Craig [name] MRN: [id number] Date of Birth: March 01, 1941  Today's Date: 12/23/2014 PT Individual Time: 1000-1100 PT Individual Time Calculation (min): 60 min   Short Term Goals: Week 1:  PT Short Term Goal 1 (Week 1): Pt will be able to demonstrate bed mobility with S PT Short Term Goal 2 (Week 1): Pt will be able to transfer with S for basic surfaces PT Short Term Goal 3 (Week 1): Pt will be able to go up/down 3 steps with 1 rail (R) with min A  Skilled Therapeutic Interventions/Progress Updates:   Session focused on overall endurance and activity tolerance, functional transfers including bed in ADL apartment and couch to simulate home environment, gait with SPC for household mobility and for endurance on unit, and functional balance activities in standing with focus on posture, neck extension, balance, and overall endurance. Pt performs transfers at S to steady A level using SPC or or AD including furniture transfer off of couch (lower surface with steady A). Steady A for gait with SPC today with improved posture and gait speed noted. Also addressed household gait on carpeted surface in ADL apartment with close S/steady A for short distances. Tube feeds disconnected during this session and practiced bed mobility on regular bed at S to mod I level x 2 reps which pt and wife reports bed in there is close to their bed at home. Repeated balance activity from session yesterday with reaching for horseshoes and placing overhead on basketball hoop with improved endurance noted and ease of functional use of BUE. Also worked on balance activities with kicking a ball in standing and tapping ball using R and L side to address posture, balance, endurance, and cues to keep head up. Pt maintained sats >91% on 1 L O2 during session.   Therapy Documentation Precautions:  Precautions Precautions: Fall, Other (comment) Precaution Comments: O2 via [place]; NG  tube Restrictions Weight Bearing Restrictions: No Other Position/Activity Restrictions: HOB elevated due to continuous tube feeds. Pain: Pain Assessment Pain Assessment: No/denies pain Locomotion : Ambulation Ambulation/Gait Assistance: 4: Min guard    See FIM for current functional status  Therapy/Group: Individual Therapy  [name] [facility] 12/23/2014, 11:26 AM

## 2014-12-24 ENCOUNTER — Inpatient Hospital Stay (HOSPITAL_COMMUNITY): Payer: PPO | Admitting: Occupational Therapy

## 2014-12-24 ENCOUNTER — Encounter (HOSPITAL_COMMUNITY): Payer: PPO | Admitting: Occupational Therapy

## 2014-12-24 ENCOUNTER — Inpatient Hospital Stay (HOSPITAL_COMMUNITY): Payer: PPO | Admitting: Physical Therapy

## 2014-12-24 ENCOUNTER — Inpatient Hospital Stay (HOSPITAL_COMMUNITY): Payer: PPO | Admitting: Speech Pathology

## 2014-12-24 ENCOUNTER — Inpatient Hospital Stay (HOSPITAL_COMMUNITY): Payer: PPO

## 2014-12-24 LAB — GLUCOSE, CAPILLARY
GLUCOSE-CAPILLARY: 144 mg/dL — AB (ref 70–99)
GLUCOSE-CAPILLARY: 175 mg/dL — AB (ref 70–99)
Glucose-Capillary: 127 mg/dL — ABNORMAL HIGH (ref 70–99)
Glucose-Capillary: 144 mg/dL — ABNORMAL HIGH (ref 70–99)
Glucose-Capillary: 121 mg/dL — ABNORMAL HIGH (ref 70–99)
Glucose-Capillary: 136 mg/dL — ABNORMAL HIGH (ref 70–99)
Glucose-Capillary: 120 mg/dL — ABNORMAL HIGH (ref 70–99)
Glucose-Capillary: 186 mg/dL — ABNORMAL HIGH (ref 70–99)

## 2014-12-24 NOTE — Progress Notes (Signed)
NIF -50cmH20, and VC 1.2L.

## 2014-12-24 NOTE — Progress Notes (Signed)
Speech Language Pathology Weekly Progress and Session Note  Patient Details  Name: Craig [name] MRN: [id number] Date of Birth: [birth year]  Beginning of progress report period: December 17, 2014 End of progress report period: December 24, 2014  Today's Date: 12/24/2014 SLP Individual Time: 0938-1829 SLP Individual Time Calculation (min): 38 min  Short Term Goals: Week 1: SLP Short Term Goal 1 (Week 1): Patient will keep his oral cavity closed to increase mandibular strength and overall breath support for 60 seconds at a time in 50% of opportunities with Mod A multimodal cues.  SLP Short Term Goal 1 - Progress (Week 1): Met SLP Short Term Goal 2 (Week 1): Patient will utilize diaphragmatic breathing to increase vocal intensity and speech intelligibility at the phrase level  to 90% accuracy with Supervision multimodal cues.  SLP Short Term Goal 2 - Progress (Week 1): Progressing toward goal SLP Short Term Goal 3 (Week 1): Patient will consume trials of ice chips with minimal overt s/s of aspiration with 50% of trials with Min A multimodal cues.  SLP Short Term Goal 3 - Progress (Week 1): Met    New Short Term Goals: Week 2: SLP Short Term Goal 1 (Week 2): Patient will keep his oral cavity closed to increase mandibular strength and overall breath support for 60 seconds at a time in 50% of opportunities with Min A multimodal cues.  SLP Short Term Goal 2 (Week 2): Patient will utilize diaphragmatic breathing to increase vocal intensity and speech intelligibility at the phrase level  to 90% accuracy with Supervision multimodal cues.  SLP Short Term Goal 3 (Week 2): Patient will consume trials of ice chips with minimal overt s/s of aspiration with 75% of trials with Min A multimodal cues.   Weekly Progress Updates:  Pt has made functional gains this reporting period and has met 2 out of 3 short term goals. Pt has demonstrated improved mandibular strength and breath support for speech and is  tolerating trials of ice chips with minimal overt s/s of aspiration with min assist.  Pt continues to require overall min-mod assist to increased vocal intensity and improve speech intelligibility at the phrase level; however, he is demonstrating slow improvements in use of diaphragmatic breathing techniques.  Pt would continue to benefit from skilled ST while inpatient in order to maximize functional independence and reduce burden of care prior to discharge.  Pt may also benefit from an MBS in order to objectively determine readiness to initiate a PO diet.  Pt and family education is ongoing.      Intensity: Minumum of 1-2 x/day, 30 to 90 minutes Frequency: 3 to 5 out of 7 days Duration/Length of Stay: 10-12 days per team, however, dysphagia goals will require an extended amount of time  Treatment/Interventions: Environmental controls;Dysphagia/aspiration precaution training;Cueing hierarchy;Functional tasks;Internal/external aids;Patient/family education;Therapeutic Activities   Daily Session  Skilled Therapeutic Interventions: Pt was seen for skilled ST targeting cognitive goals.  Upon arrival, pt was seated upright in recliner with wife present.  Pt was awake, alert, and agreeable to participate in ST.  SLP facilitated the session with trials of honey thick liquids and purees to continue working towards initiation of a PO diet.  Pt was noted with coughing on >75% of trials which did not appear to improve with interventions for small bites/sips.  Pt would benefit from an MBS in order to objectively determine safest consistencies for potential diet initiation. Pt and pt's wife were updated on recommendations and in agreement with the treatment plan  for tomorrow.  Goals updated on this date to reflect current goals and progress in therapy.       FIM:  Comprehension Comprehension Mode: Auditory Comprehension: 5-Understands complex 90% of the time/Cues < 10% of the time Expression Expression Mode:  Verbal Expression: 5-Expresses basic 90% of the time/requires cueing < 10% of the time. Social Interaction Social Interaction: 6-Interacts appropriately with others with medication or extra time (anti-anxiety, antidepressant). Problem Solving Problem Solving: 5-Solves complex 90% of the time/cues < 10% of the time Memory Memory: 5-Recognizes or recalls 90% of the time/requires cueing < 10% of the time FIM - Eating Eating Activity: 4: Help with managing cup/glass (with trials )    Pain Pain Assessment Pain Assessment: No/denies pain  Therapy/Group: Individual Therapy  Taran Hable, Selinda Orion 12/24/2014, 3:44 PM

## 2014-12-24 NOTE — Progress Notes (Signed)
Physical Therapy Session Note  Patient Details  Name: Craig [name] MRN: [id number] Date of Birth: May 11, 1941  Today's Date: 12/24/2014 PT Individual Time: 1030-1100 PT Individual Time Calculation (min): 30 min   Short Term Goals: Week 1:  PT Short Term Goal 1 (Week 1): Pt will be able to demonstrate bed mobility with S PT Short Term Goal 2 (Week 1): Pt will be able to transfer with S for basic surfaces PT Short Term Goal 3 (Week 1): Pt will be able to go up/down 3 steps with 1 rail (R) with min A  Skilled Therapeutic Interventions/Progress Updates:    Gait Training: Pt received up in recliner chair - wife present entire session. PT instructs pt in sit to stand to wash hands at sink prior to leaving contact room and pt req steadying assist for balance due to one brake of recliner chair being off. PT instructs pt in ambulation 200' x 2 reps with SPC req CGA on first rep and up to min A on 2nd rep (returning to room) due to fatigue and trial of ambulating without supplemental oxygen. (SaO2 drops to 94% on RA after ambulating 200'). PT instructs pt in ascending/descending 5 stairs, then 10 stairs with R handrail and L SPC (on way up) req CGA for safety. Both sets of stairs included pt off of supplemental oxygen as a trial - SaO2 dropped to 93% at the lowest, but pt recovers to 95% with seated rest break.   Overall, pt was off supplemental oxygen for 20 minutes of PT session and SaO2 remained > 92% with stairs and ambulation activity. PT returned pt to 1 L O2/min in room, due to not being the primary therapist and therefore not weaning pt from oxygen. Without supplemental oxygen, pt tends to fatigue more quickly and balance worsens with fatigue. Continue with activity tolerance, standing balance reeducation, and gait training per POC.   Therapy Documentation Precautions:  Precautions Precautions: Fall, Other (comment) Precaution Comments: O2 via [place]; NG tube Restrictions Weight Bearing  Restrictions: No Other Position/Activity Restrictions: HOB elevated due to continuous tube feeds. Pain: Pain Assessment Pain Assessment: No/denies pain Locomotion : Ambulation Ambulation/Gait Assistance: 4: Min guard;4: Min assist   See FIM for current functional status  Therapy/Group: Individual Therapy  Charmagne Buhl M 12/24/2014, 12:54 PM

## 2014-12-24 NOTE — Progress Notes (Signed)
Occupational Therapy Session Note  Patient Details  Name: Craig [name] MRN: [id number] Date of Birth: [birth year]  Today's Date: 12/24/2014 OT Individual Time: 2035-5974 OT Individual Time Calculation (min): 30 min    Short Term Goals: Week 1:  OT Short Term Goal 1 (Week 1): Pt will transfer to BSC/ toilet with supervision OT Short Term Goal 2 (Week 1): Pt will perform 3/3 toileting tasks with steady  A OT Short Term Goal 3 (Week 1): Pt will perform toothbrushing (with suction) with setup OT Short Term Goal 4 (Week 1): Pt will obtain own clothing in prep for ADL tasks with steady A (in standing). OT Short Term Goal 5 (Week 1): Pt will thread LB clothing with setup   Skilled Therapeutic Interventions/Progress Updates:    Pt seen for 1:1 OT session with focus on BUE strengthening and activity tolerance. Pt received sitting in recliner chair reporting he had just returned from PT session. Pt declined leaving room for therapy due to fatigue however agreeable to participation in room. Engaged in [place] strengthening exercises using Level 2 theraband and cane. Completed shoulder flexion exercises with use of cane and pt achieving up to 80 degrees B shoulder flexion. Utilized theraband for chest, shoulder, and bicep strengthening. Pt completed 1 set of 5 reps of each exercise then progressed to 2 sets x10 reps of exercises. Therapist provided min A at elbow for support with exercises against gravity. At end of session, pt left sitting in recliner chair with all needs in reach.   Therapy Documentation Precautions:  Precautions Precautions: Fall, Other (comment) Precaution Comments: O2 via [place]; NG tube Restrictions Weight Bearing Restrictions: No Other Position/Activity Restrictions: HOB elevated due to continuous tube feeds. General:   Vital Signs:  Pain: Pain Assessment Pain Assessment: No/denies pain Pain Score: 0-No pain ADL: ADL ADL Comments: see FIM Exercises:   Other Treatments:     See FIM for current functional status  Therapy/Group: Individual Therapy  Dia Donate, Quillian Quince 12/24/2014, 12:12 PM

## 2014-12-24 NOTE — Progress Notes (Signed)
NUTRITION FOLLOW UP  DOCUMENTATION CODES Per approved criteria  -Obesity Unspecified   INTERVENTION: Continue Jevity 1.2 via NGT at goal rate of 75 ml/20 hr with 30 ml Prostat once daily to 1900 kcal (100% of needs), 98 grams of protein, and 1215 ml of H20.  Continue free water flushes.  Will continue to monitor.  NUTRITION DIAGNOSIS: Inadequate oral intake related to inability to eat as evidenced by NPO status; ongoing  Goal: Pt to meet >/= 90% of estimated energy requirements; met  Monitor:  TF rate/tolerance, weight, labs, I/Os  74 y.o. male  Admitting Dx: MG, crisis (myasthenia gravis)  ASSESSMENT: 74 y/o male transferred to [facility] on 1/22 with acute MG exacerbation and respiratory failure. Decompensation requiring intubation 1/22 pm. Pt has hx of HTN, CHF, DM2, A. 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 Pt c diff positive. He is able to suction oral secretions and is currently NPO with tube feeds for nutritional support.  Pt has been tolerating his tube feeding well with no other difficulties. Will continue with current orders. Per MD note, PEG still in consideration.  Labs and medications reviewed.  Height: Ht Readings from Last 1 Encounters:  12/19/14 _0  (1.702 m)    Weight: Wt Readings from Last 1 Encounters:  12/24/14 242 lb 11.6 oz (110.1 kg)    BMI: Body mass index is 38.01 kg/(m^2).  Re-Estimated Nutritional Needs: Kcal: 1750-1950 Protein: 90-105 grams Fluid: >1.5 L  Skin: +1 RLE edema, non-pitting LLE edema  Diet Order: NPO    Intake/Output Summary (Last 24 hours) at 12/24/14 1543 Last data filed at 12/24/14 1330  Gross per 24 hour  Intake    400 ml  Output    675 ml  Net   -275 ml    Last BM: 2/9  Labs:   Recent Labs Lab 12/18/14 0500 12/19/14 0615  NA 141 143  K 3.4* 3.1*  CL 102 108  CO2 34* 30  BUN 13 16  CREATININE 0.55 0.60  CALCIUM 9.0 8.7  GLUCOSE 132* 169*    CBG (last 3)   Recent Labs  12/24/14 0033 12/24/14 0344 12/24/14 0821  GLUCAP 127* 144* 121*    Scheduled Meds: . albuterol  2.5 mg Nebulization BID  . amiodarone  100 mg Per Tube Daily  . amLODipine  5 mg Per Tube Daily  . antiseptic oral rinse  7 mL Mouth Rinse QID  . chlorhexidine  15 mL Mouth Rinse BID  . cholestyramine light  4 g Oral Q1200  . famotidine  20 mg Per Tube Daily  . free water  200 mL Per Tube Q4H  . insulin aspart  0-20 Units Subcutaneous 6 times per day  . lactobacillus  1 g Per Tube TID WC  . metroNIDAZOLE  500 mg Per Tube 3 times per day  . polycarbophil  1,250 mg Per Tube BID  . potassium chloride  20 mEq Oral TID  . predniSONE  60 mg Oral Q breakfast  . pyridostigmine  90 mg Per NG tube QID  . rivaroxaban  20 mg Oral Q supper  . saccharomyces boulardii  250 mg Oral BID    Continuous Infusions: . feeding supplement (JEVITY 1.2 CAL) 1,000 mL (12/23/14 2151)    Past Medical History  Diagnosis Date  . Atrial fibrillation, chronic     on Xarelto   . HTN (hypertension)   . CHF (congestive heart failure)   . Diabetes mellitus   . 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 Abdominal wall metastasis removal       Kallie Locks, MS, RD, LDN Pager # [phone] After hours/ weekend pager # [phone]

## 2014-12-24 NOTE — Progress Notes (Signed)
[facility]ILITATION     PROGRESS NOTE    Subjective/Complaints: No complaints. Breathing well. No cough. Pain minimal A  review of systems has been performed and if not noted above is otherwise negative.  Objective: Vital Signs: Blood pressure 109/57, pulse 70, temperature 97.7 F (36.5 C), temperature source Oral, resp. rate 18, height 5\' 7"  (1.702 m), weight 110.1 kg (242 lb 11.6 oz), SpO2 97 %. No results found. No results for input(s): WBC, HGB, HCT, PLT in the last 72 hours. No results for input(s): NA, K, CL, GLUCOSE, BUN, CREATININE, CALCIUM in the last 72 hours.  Invalid input(s): CO CBG (last 3)   Recent Labs  12/23/14 2106 12/24/14 0033 12/24/14 0344  GLUCAP 151* 127* 144*    Wt Readings from Last 3 Encounters:  12/24/14 110.1 kg (242 lb 11.6 oz)  12/17/14 107.4 kg (236 lb 12.4 oz)  05/03/10 131.543 kg (290 lb)    Physical Exam:  Nursing note and vitals reviewed. Constitutional: He appears well-developed and well-nourished.  Panda in place left nare.  HENT: BiBap mask secured Head: Normocephalic and atraumatic.  Eyes: Left conjunctiva somewhat injected.  Ptosis better  Cardiovascular: Normal rate and regular rhythm.  Respiratory: Effort normal. No respiratory distress. He has no wheezes. He has rhonchi.  GI: Soft. Bowel sounds are normal. He exhibits no distension. There is no tenderness.  Musculoskeletal: He exhibits no edema or tenderness.  Neurological: He is alert.  Facial diplegia improving. Cough and oro-motor control is better.. Followed motor commands without difficulty. Reasonable insight and awareness. Sensory exam intact to PP and LT. Strength 4/5 UE's bilaterally prox to distal. LE's4-/5 hf, 4/5ke, 4 ankles.  Skin: Skin is warm and dry.  Psychiatric: He has a normal mood and affect. His behavior is normal. Good insight and awareness  Assessment/Plan: 1. Functional deficits secondary to Myasthenia Gravis  Crisis which require 3+ hours per day of interdisciplinary therapy in a comprehensive inpatient rehab setting. Physiatrist is providing close team supervision and 24 hour management of active medical problems listed below. Physiatrist and rehab team continue to assess barriers to discharge/monitor patient progress toward functional and medical goals. FIM: FIM - Bathing Bathing Steps Patient Completed: Chest, Right Arm, Left Arm, Abdomen, Front perineal area, Right upper leg, Left upper leg, Right lower leg (including foot), Left lower leg (including foot) Bathing: 4: Min-Patient completes 8-9 54f 10 parts or 75+ percent  FIM - Upper Body Dressing/Undressing Upper body dressing/undressing steps patient completed: Thread/unthread left sleeve of pullover shirt/dress, Put head through opening of pull over shirt/dress, Pull shirt over trunk Upper body dressing/undressing: 4: Min-Patient completed 75 plus % of tasks FIM - Lower Body Dressing/Undressing Lower body dressing/undressing steps patient completed: Thread/unthread left underwear leg, Pull underwear up/down, Thread/unthread right pants leg, Thread/unthread left pants leg, Pull pants up/down, Fasten/unfasten pants, Don/Doff right sock, Don/Doff left sock, Don/Doff right shoe, Don/Doff left shoe Lower body dressing/undressing: 4: Min-Patient completed 75 plus % of tasks  FIM - Toileting Toileting steps completed by patient: Adjust clothing prior to toileting, Adjust clothing after toileting Toileting Assistive Devices: Grab bar or rail for support Toileting: 3: Mod-Patient completed 2 of 3 steps  FIM - Radio producer Devices: Recruitment consultant Transfers: 4-From toilet/BSC: Min A (steadying Pt. > 75%)  FIM - Control and instrumentation engineer Devices: Best boy: 5: Supine > Sit: Supervision (verbal cues/safety issues), 5: Sit > Supine: Supervision (verbal cues/safety issues), 5: Bed  > Chair  or W/C: Supervision (verbal cues/safety issues), 5: Chair or W/C > Bed: Supervision (verbal cues/safety issues)  FIM - Locomotion: Wheelchair Locomotion: Wheelchair: 5: Travels 150 ft or more: maneuvers on rugs and over door sills with supervision, cueing or coaxing FIM - Locomotion: Ambulation Locomotion: Ambulation Assistive Devices: Journalist, newspaper Ambulation/Gait Assistance: 4: Min guard Locomotion: Ambulation: 4: Travels 150 ft or more with minimal assistance (Pt.>75%)  Comprehension Comprehension Mode: Auditory Comprehension: 5-Understands complex 90% of the time/Cues < 10% of the time  Expression Expression Mode: Verbal Expression: 5-Expresses basic 90% of the time/requires cueing < 10% of the time.  Social Interaction Social Interaction: 6-Interacts appropriately with others with medication or extra time (anti-anxiety, antidepressant).  Problem Solving Problem Solving Mode: Asleep Problem Solving: 5-Solves complex 90% of the time/cues < 10% of the time  Memory Memory Mode: Asleep Memory: 5-Requires cues to use assistive device   Medical Problem List and Plan: 1. Functional deficits secondary to MG crises 2. DVT Prophylaxis/Anticoagulation: Pharmaceutical: Xarelto 3. Pain Management: Tylenol prn 4. Mood: Team to provide ego support. LCSW to follow for evaluation and support.  5. Neuropsych: This patient is capable of making decisions on her own behalf. 6. Skin/Wound Care: routine pressure relief measures. Maintain adequate nutrition and hydration status.  7. Fluids/Electrolytes/Nutrition: Continue TF. Added questran for bulking. Continue water flushes every 4 hours.    8. C diff colitis: On flagyl D# 14/14. added probiotics/fiber. 9. A fib: Monitor heart rate every 8 hours. Continue amiodarone and Xarelto.  10. Diarrhea: Exacerbated by tube feeds and c diff. Will add [place] daily. May need to change tube feeds to one with fiber. Will consult RD for  input.  11. Hypokalemia: Add supplement. Level slightly improved today.  12. MG crises: On prednisone 60 mg/daily and mestinon 90 mg qid.   13. Continued oro-pharyngeal dysphagia---NGT for now -continue work with SLP---will shoot for MBS on Friday---PEG still in consideration 14. Respiratory: BIPAP working well. On home settings  -nebs prn  -suction, coughing, IS  -continue to wean oxygen to off    LOS (Days) 7 A FACE TO FACE EVALUATION WAS PERFORMED  Graham Doukas T 12/24/2014 7:59 AM

## 2014-12-24 NOTE — Progress Notes (Signed)
Occupational Therapy Session Note  Patient Details  Name: Craig [name] MRN: [id number] Date of Birth: July 18, 1941  Today's Date: 12/24/2014 OT Individual Time: 0900-1000 OT Individual Time Calculation (min): 60 min    Short Term Goals: Week 1:  OT Short Term Goal 1 (Week 1): Pt will transfer to BSC/ toilet with supervision OT Short Term Goal 2 (Week 1): Pt will perform 3/3 toileting tasks with steady  A OT Short Term Goal 3 (Week 1): Pt will perform toothbrushing (with suction) with setup OT Short Term Goal 4 (Week 1): Pt will obtain own clothing in prep for ADL tasks with steady A (in standing). OT Short Term Goal 5 (Week 1): Pt will thread LB clothing with setup   Skilled Therapeutic Interventions/Progress Updates:    Pt seen for ADL and functional ambulation/ endurance retraining. Pt transferred supine>EOB with supervision and increased time. He ambulated to chair at sink using [place] and supervision. He had 1 LOB episode, however, able to self-correct. Pt able to complete UB and LB bathing with set-up. He stood to complete pericare/buttock hygiene, holding onto counter for support. Pt demonstrated good static standing balance when standing to pull up underwear/pants.Pt initiated hair brushing task with encouragement, before asking for assist.  Pt then ambulated into bathroom for toileting task. Pt required mod A sit>stand from toilet. He then ambulated to sink for standing hand wasing task. Pt and wife educated on bathroom/ home modifications such as grab bars. Pt completed oral care with suctioning toothbrush, and requested therapist to assist for throughness.  Pt demonstrated good functional endurance throughout session. O2 level remained >93 on 1.5LO2.  Education provided on energy conservation, home modifications, and importance of participation with ADLs.   Therapy Documentation Precautions:  Precautions Precautions: Fall, Other (comment) Precaution Comments: O2 via [place]; NG  tube Restrictions Weight Bearing Restrictions: No Other Position/Activity Restrictions: HOB elevated due to continuous tube feeds. General:   Vital Signs:  Pain: Pain Assessment Pain Assessment: No/denies pain Pain Score: 0-No pain ADL: ADL ADL Comments: see FIM  See FIM for current functional status  Therapy/Group: Individual Therapy  Rhylen, Reyce Lubeck C 12/24/2014, 12:10 PM

## 2014-12-24 NOTE — Patient Care Conference (Signed)
Inpatient RehabilitationTeam Conference and Plan of Care Update Date: 12/23/2014   Time: 2:45 PM    Patient Name: Craig [name]      Medical Record Number: [id number]  Date of Birth: [birth year] Sex: Male         Room/Bed: 4M08C/4M08C-01 Payor Info: Payor: [employer] / Plan: [employer] / Product Type: *No Product type* /    Admitting Diagnosis: MYASTHENIA GRAVIS  Admit Date/Time:  12/17/2014  3:02 PM Admission Comments: No comment available   Primary Diagnosis:  MG, crisis (myasthenia gravis) Principal Problem: MG, crisis (myasthenia gravis)  Patient Active Problem List   Diagnosis Date Noted  . MG, crisis (myasthenia gravis) 12/17/2014  . Dysphagia, pharyngoesophageal phase   . Enteritis due to Clostridium difficile   . Clostridium difficile diarrhea 12/15/2014  . Hypernatremia   . Acute exacerbation of myasthenia gravis   . Myasthenia gravis in crisis 12/05/2014  . Bladder cancer 12/05/2014  . HTN (hypertension) 12/05/2014  . A-fib 12/05/2014  . Chronic anticoagulation 12/05/2014  . Acute respiratory failure with hypoxia 12/05/2014    Expected Discharge Date: Expected Discharge Date: 12/30/14  Team Members Present: Physician leading conference: Dr. Alger Simons Social Worker Present: Alfonse Alpers, LCSW Nurse Present: Elliot Cousin, RN PT Present: Raylene Everts, PT;Jess Anastasia Fiedler, PT OT Present: Salome Spotted, OT;Donnie Panik Tamala Julian, OT SLP Present: Gunnar Fusi, SLP PPS Coordinator present : Daiva Nakayama, RN, CRRN     Current Status/Progress Goal Weekly Team Focus  Medical   myasthenia gravis, getting stronger. symptoms reducing.  improve stamina, endurance  swallowing, weaning oxygen   Bowel/Bladder   Continent of bowel and bladder. LBM 12/21/14  Pt to remain continent of bowel and bladder  Monitor   Swallow/Nutrition/ Hydration   NPO with NG tube, Min A with trials   Participate in dysphagia treatment with Min A  trials of ice chips, etc, objective  swallow test sometime this week    ADL's   bathing/dresdsing-mod A; functional transfers-min A; decreased activity tolerance  mod I overall  activity tolerance, BADLs, family education, functinal transfers, standig balance   Mobility   min A overall  S to mod I overall  endurance, activity tolerance, strengthening, gait, stairs   Communication             Safety/Cognition/ Behavioral Observations  Supervision-Min A  Mod I  incresaed vocal intensity, over-articulation, diaphragmatic breathing    Pain   No c/o pain  <3  Monitor   Skin   Bruising to BLE  No additional skin breakdown  Assist with turn q 2hrs    Rehab Goals Patient on target to meet rehab goals: Yes Rehab Goals Revised: None *See Care Plan and progress notes for long and short-term goals.  Barriers to Discharge: prfound bulbar dysfunctions    Possible Resolutions to Barriers:  wean oxygen. dysphagia    Discharge Planning/Teaching Needs:  Pt plans to go home with his wife at d/c.  She feels comfortable with the care pt may need at home.  Pt's wife is present daily for therapies and can participate in family education as needed.   Team Discussion:  Pt is progressing, although endurance remains low and pt fatigues easily.  Pt is doing well with PT and they have been able to wean his O2 to 1L.  If pt is off O2, he will meet modified independent goals.  Otherwise, he will need supervision to assist with the O2.  Pt's swallowing will be the main focus in addition to increasing endurance  through to d/c.  MD approved for tube feedings to be held so that pt can shower.  ST will perform swallowing study later in the week.  Revisions to Treatment Plan:  None   Continued Need for Acute Rehabilitation Level of Care: The patient requires daily medical management by a physician with specialized training in physical medicine and rehabilitation for the following conditions: Daily direction of a multidisciplinary physical  rehabilitation program to ensure safe treatment while eliciting the highest outcome that is of practical value to the patient.: Yes Daily medical management of patient stability for increased activity during participation in an intensive rehabilitation regime.: Yes Daily analysis of laboratory values and/or radiology reports with any subsequent need for medication adjustment of medical intervention for : Neurological problems;Pulmonary problems  Craig [name], Craig [name] 12/24/2014, 12:51 PM

## 2014-12-24 NOTE — Progress Notes (Signed)
Occupational Therapy Session Note  Patient Details  Name: Craig [name] MRN: [id number] Date of Birth: 09/04/1941  Today's Date: 12/24/2014 OT Individual Time: 1330-1400 OT Individual Time Calculation (min): 30 min    Short Term Goals: Week 1:  OT Short Term Goal 1 (Week 1): Pt will transfer to BSC/ toilet with supervision OT Short Term Goal 2 (Week 1): Pt will perform 3/3 toileting tasks with steady  A OT Short Term Goal 3 (Week 1): Pt will perform toothbrushing (with suction) with setup OT Short Term Goal 4 (Week 1): Pt will obtain own clothing in prep for ADL tasks with steady A (in standing). OT Short Term Goal 5 (Week 1): Pt will thread LB clothing with setup   Skilled Therapeutic Interventions/Progress Updates:    Pt in w/[name] upon arrival with RN present. Pt voiced need for use of urinal, and stood to complete task with hand help urinal. Pt voiced desire to change pants/underwear following toileting task. Pt completed standing pericare/buttock hygine and pulled underwear up/down with supervision. Pt ambulated within room from recliner <> closet to obtain clothing items with close supervision, denying need for use of SPC. Pt then ambulated to bathroom, requesting use of SPC. Completed toilet transfer with supervision with use of R side hand rail. Pt's wife reports that home bathroom set-up allows pt to use counter top on R side to assist with transfers and steadying assist as needed.  Pt demonstrated resistant band UE exercises taught in previous session. Pt correctly recalled all exercises, and was able to complete exercises without steadying assist of UE. Pt's wife reports that steadying assist of UE was required during initial introduction of exercises.  Education provided regarding energy conservation, planning ahead for fatiguing activities, and 1:3 hospital stay to recovery ratio. Pt remained >93% 02 level on 1 L. Pt left sitting in chair with all needs in reach and wife present.    Therapy Documentation Precautions:  Precautions Precautions: Fall, Other (comment) Precaution Comments: O2 via [place]; NG tube Restrictions Weight Bearing Restrictions: No Other Position/Activity Restrictions: HOB elevated due to continuous tube feeds. General:   Vital Signs:  Pain: Pain Assessment Pain Assessment: No/denies pain ADL: ADL ADL Comments: see FIM  See FIM for current functional status  Therapy/Group: Individual Therapy  Craig [name] 12/24/2014, 2:11 PM

## 2014-12-24 NOTE — Progress Notes (Signed)
Good PT effort NIF -60 VC 6L

## 2014-12-25 ENCOUNTER — Inpatient Hospital Stay (HOSPITAL_COMMUNITY): Payer: PPO

## 2014-12-25 ENCOUNTER — Encounter (HOSPITAL_COMMUNITY): Payer: PPO | Admitting: Speech Pathology

## 2014-12-25 ENCOUNTER — Encounter (HOSPITAL_COMMUNITY): Payer: PPO | Admitting: Occupational Therapy

## 2014-12-25 ENCOUNTER — Encounter (HOSPITAL_COMMUNITY): Payer: Self-pay | Admitting: Radiology

## 2014-12-25 LAB — BASIC METABOLIC PANEL
Anion gap: 5 (ref 5–15)
BUN: 11 mg/dL (ref 6–23)
CALCIUM: 8.7 mg/dL (ref 8.4–10.5)
CO2: 32 mmol/L (ref 19–32)
CREATININE: 0.71 mg/dL (ref 0.50–1.35)
Chloride: 104 mmol/L (ref 96–112)
GFR calc Af Amer: >90 mL/min (ref >90)
GFR calc non Af Amer: >90 mL/min (ref >90)
Glucose, Bld: 130 mg/dL — ABNORMAL HIGH (ref 70–99)
Potassium: 3.7 mmol/L (ref 3.5–5.1)
Sodium: 141 mmol/L (ref 135–145)

## 2014-12-25 LAB — GLUCOSE, CAPILLARY
GLUCOSE-CAPILLARY: 128 mg/dL — AB (ref 70–99)
GLUCOSE-CAPILLARY: 136 mg/dL — AB (ref 70–99)
GLUCOSE-CAPILLARY: 142 mg/dL — AB (ref 70–99)
GLUCOSE-CAPILLARY: 147 mg/dL — AB (ref 70–99)
GLUCOSE-CAPILLARY: 170 mg/dL — AB (ref 70–99)
Glucose-Capillary: 126 mg/dL — ABNORMAL HIGH (ref 70–99)
Glucose-Capillary: 200 mg/dL — ABNORMAL HIGH (ref 70–99)

## 2014-12-25 MED ORDER — ALBUTEROL SULFATE (2.5 MG/3ML) 0.083% IN NEBU
2.5000 mg | INHALATION_SOLUTION | Freq: Two times a day (BID) | RESPIRATORY_TRACT | Status: DC
  Administered 2014-12-25 (×2): 2.5 mg via RESPIRATORY_TRACT
  Filled 2014-12-25: qty 3

## 2014-12-25 MED ORDER — HEPARIN SOD (PORK) LOCK FLUSH 100 UNIT/ML IV SOLN
500.0000 [IU] | INTRAVENOUS | Status: DC | PRN
  Filled 2014-12-25: qty 5

## 2014-12-25 MED ORDER — HEPARIN SOD (PORK) LOCK FLUSH 100 UNIT/ML IV SOLN
500.0000 [IU] | INTRAVENOUS | Status: DC
  Administered 2014-12-25: 500 [IU]

## 2014-12-25 MED ORDER — RIVAROXABAN 20 MG PO TABS
20.0000 mg | ORAL_TABLET | Freq: Every day | ORAL | Status: DC
  Filled 2014-12-25 (×2): qty 1

## 2014-12-25 NOTE — Progress Notes (Signed)
Nif -50cmH20, and VC 0.85L

## 2014-12-25 NOTE — Progress Notes (Signed)
Occupational Therapy Session Note  Patient Details  Name: Craig [name] MRN: [id number] Date of Birth: Jan 14, 1941  Today's Date: 12/25/2014 OT Individual Time: 1000-1100 OT Individual Time Calculation (min): 60 min    Short Term Goals: Week 1:  OT Short Term Goal 1 (Week 1): Pt will transfer to BSC/ toilet with supervision OT Short Term Goal 2 (Week 1): Pt will perform 3/3 toileting tasks with steady  A OT Short Term Goal 3 (Week 1): Pt will perform toothbrushing (with suction) with setup OT Short Term Goal 4 (Week 1): Pt will obtain own clothing in prep for ADL tasks with steady A (in standing). OT Short Term Goal 5 (Week 1): Pt will thread LB clothing with setup   Skilled Therapeutic Interventions/Progress Updates:    Pt completed ADL bathing task seated at the sink. Pt having just arrived back from off floor procedure at time of session, and voiced feeling fatigued and not having slept well, however, was willing to still participate in therapy as able. Pt transferred supine>EOB with HOB slightly raised due to swallowing difficulties today. Pt ambulated EOB <> seat at the sink with close supervision and use of SPC. Pt bathed UB with set-up, he required min A for LB bathing for buttock hygiene. Pt able to dress LB with min A requiring assist to straighten R sock secondary to fatigue. Pt required increased rest breaks throughout session today compared to previous sessions.  Pt completed grooming with mod A, he combed hair independently and requested assist for throughness with oral hygiene.   Therapy initially began with pt on 1L O2, however, pt dropped to 80% with exertion. O2 increased to 2L, and pt able to fully recover to >95%.   Education provided regarding deep breathing technique, modified dressing techniques, and energy conservation. Pt's wife present during dressing session, and is able to verbalizes understanding of proper care.  Pt left sitting up in recliner at end of session with wife  present and all needs in reach.   Therapy Documentation Precautions:  Precautions Precautions: Fall, Other (comment) Precaution Comments: O2 via [place]; NG tube Restrictions Weight Bearing Restrictions: No Other Position/Activity Restrictions: HOB elevated due to continuous tube feeds. General:   Vital Signs:  Pain: Pain Assessment Pain Assessment: No/denies pain ADL: ADL ADL Comments: see FIM  See FIM for current functional status  Therapy/Group: Individual Therapy  Daking, Klye Besecker C 12/25/2014, 12:20 PM

## 2014-12-25 NOTE — Progress Notes (Signed)
Social Work Patient ID: Craig [name], male   DOB: 06-19-1941, 74 y.o.   MRN: [id number]   CSW met with pt and his wife to update them on team conference discussion.  They were pleased with this date and feel comfortable with leaving the hospital.  Wife is present daily for therapies and she feels she can provide necessary care for pt at home.  CSW discussed arranging home health for pt and any necessary DME.  They have no current concerns/needs/questions at this time.  CSW will continue to follow and assist as needed.

## 2014-12-25 NOTE — Progress Notes (Signed)
Speech Language Pathology Note  Patient Details  Name: Craig [name] MRN: [id number] Date of Birth: [birth year] Today's Date: 12/25/2014  Pt was seen for an MBS to objectively determine readiness for initiation of PO diet.  Report is now located under imaging.  Thank you.   Patton Swisher, Selinda Orion 12/25/2014, 2:53 PM

## 2014-12-25 NOTE — Progress Notes (Signed)
PHYSICAL MEDICINE & REHABILITATION     PROGRESS NOTE    Subjective/Complaints: Restless last night. Didn't sleep as well. Denies pain.  A  review of systems has been performed and if not noted above is otherwise negative.  Objective: Vital Signs: Blood pressure 103/52, pulse 71, temperature 97.8 F (36.6 C), temperature source Oral, resp. rate 18, height 5\' 7"  (1.702 m), weight 108.3 kg (238 lb 12.1 oz), SpO2 94 %. No results found. No results for input(s): WBC, HGB, HCT, PLT in the last 72 hours.  Recent Labs  12/25/14 0600  NA 141  K 3.7  CL 104  GLUCOSE 130*  BUN 11  CREATININE 0.71  CALCIUM 8.7   CBG (last 3)   Recent Labs  12/25/14 0012 12/25/14 0356 12/25/14 0857  GLUCAP 126* 136* 142*    Wt Readings from Last 3 Encounters:  12/25/14 108.3 kg (238 lb 12.1 oz)  12/17/14 107.4 kg (236 lb 12.4 oz)  05/03/10 131.543 kg (290 lb)    Physical Exam:  Nursing note and vitals reviewed. Constitutional: He appears well-developed and well-nourished. Appears fatigued still Panda in place left nare.  HENT: BiBap mask secured Head: Normocephalic and atraumatic.  Eyes: Left conjunctiva somewhat injected.  Ptosis better  Cardiovascular: Normal rate and regular rhythm.  Respiratory: Effort normal. No respiratory distress. He has no wheezes. He has rhonchi.  GI: Soft. Bowel sounds are normal. He exhibits no distension. There is no tenderness.  Musculoskeletal: He exhibits no edema or tenderness.  Neurological: He is alert.  Facial diplegia improving. Cough and oro-motor control is better..voice still lacks volume Followed motor commands without difficulty. Reasonable insight and awareness. Sensory exam intact to PP and LT. Strength 4/5 UE's bilaterally prox to distal. LE's4-/5 hf, 4/5ke, 4 ankles.  Skin: Skin is warm and dry.  Psychiatric: He has a normal mood and affect. His behavior is normal. Good insight and awareness  Assessment/Plan: 1.  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 Physiatrist is providing close team supervision and 24 hour management of active medical problems listed below. Physiatrist and rehab team continue to assess barriers to discharge/monitor patient progress toward functional and medical goals. FIM: FIM - Bathing Bathing Steps Patient Completed: Chest, Front perineal area, Right lower leg (including foot), Left lower leg (including foot), Buttocks, Right Arm, Left Arm, Right upper leg, Left upper leg, Abdomen Bathing: 5: Set-up assist to: Obtain items  FIM - Upper Body Dressing/Undressing Upper body dressing/undressing steps patient completed: Thread/unthread left sleeve of pullover shirt/dress Upper body dressing/undressing: 2: Max-Patient completed 25-49% of tasks FIM - Lower Body Dressing/Undressing Lower body dressing/undressing steps patient completed: Don/Doff left shoe, Don/Doff right shoe, Pull underwear up/down, Pull pants up/down Lower body dressing/undressing: 4: Min-Patient completed 75 plus % of tasks  FIM - Toileting Toileting steps completed by patient: Adjust clothing prior to toileting, Adjust clothing after toileting Toileting Assistive Devices: Grab bar or rail for support Toileting: 5: Supervision: Safety issues/verbal cues  FIM - Radio producer Devices: Oncologist Transfers: 3-From toilet/BSC: Mod A (lift or lower assist)  FIM - Control and instrumentation engineer Devices: Best boy: 4: Chair or W/C > Bed: Min A (steadying Pt. > 75%)  FIM - Locomotion: Wheelchair Locomotion: Wheelchair: 0: Activity did not occur FIM - Locomotion: Ambulation Locomotion: Ambulation Assistive Devices: Journalist, newspaper Ambulation/Gait Assistance: 4: Min guard, 4: Min assist Locomotion: Ambulation:  4: Travels 150 ft or more  with minimal assistance (Pt.>75%)  Comprehension Comprehension Mode: Auditory Comprehension: 5-Understands complex 90% of the time/Cues < 10% of the time  Expression Expression Mode: Verbal Expression: 5-Expresses basic 90% of the time/requires cueing < 10% of the time.  Social Interaction Social Interaction: 6-Interacts appropriately with others with medication or extra time (anti-anxiety, antidepressant).  Problem Solving Problem Solving Mode: Asleep Problem Solving: 5-Solves complex 90% of the time/cues < 10% of the time  Memory Memory Mode: Asleep Memory: 5-Recognizes or recalls 90% of the time/requires cueing < 10% of the time   Medical Problem List and Plan: 1. Functional deficits secondary to MG crises 2. DVT Prophylaxis/Anticoagulation: Pharmaceutical: Xarelto 3. Pain Management: Tylenol prn 4. Mood: Team to provide ego support. LCSW to follow for evaluation and support.  5. Neuropsych: This patient is capable of making decisions on her own behalf. 6. Skin/Wound Care: routine pressure relief measures. Maintain adequate nutrition and hydration status.  7. Fluids/Electrolytes/Nutrition: Continue TF. Added questran for bulking. Continue water flushes every 4 hours.    8. C diff colitis: On flagyl D# 14/14. added probiotics/fiber. 9. A fib: Monitor heart rate every 8 hours. Continue amiodarone and Xarelto.  10. Diarrhea: Exacerbated by tube feeds and c diff. added [place] daily. Improved 11. Hypokalemia: Add supplement. Level slightly improved today.  12. MG crises: On prednisone 60 mg/daily and mestinon 90 mg qid.   13. Continued oro-pharyngeal dysphagia---NGT for now -continue work with SLP---MBS today 56. Respiratory: BIPAP working well. On home settings  -nebs prn  -suction, coughing, IS  -continue to wean oxygen to off    LOS (Days) 8 A FACE TO FACE EVALUATION WAS PERFORMED  Cedar Roseman T 12/25/2014 9:05 AM

## 2014-12-25 NOTE — Progress Notes (Signed)
RT note-FVC-.750 and NIF -44  BBS equal with rhonchi, encouraged flutter and nebulizer given at this time.

## 2014-12-25 NOTE — Progress Notes (Signed)
Physical Therapy Session Note  Patient Details  Name: Craig [name] MRN: [id number] Date of Birth: [birth year]  Today's Date: 12/25/2014 PT Individual Time: 1100-1200 PT Individual Time Calculation (min): 60 min   Session 2 Time: 1330-1400 Time Calculation (min): 30  Short Term Goals: Week 1:  PT Short Term Goal 1 (Week 1): Pt will be able to demonstrate bed mobility with S PT Short Term Goal 2 (Week 1): Pt will be able to transfer with S for basic surfaces PT Short Term Goal 3 (Week 1): Pt will be able to go up/down 3 steps with 1 rail (R) with min A  Skilled Therapeutic Interventions/Progress Updates:    Session 1: Pt received seated in recliner, agreeable to participate in therapy. Pt on 2 L via [place] at beginning of session, then on room air for remainder of session. Pt ambulated 200' to rehab gym, then 150' back to room at end of session. Pt maintained oxygen saturation >94% throghout session with all activity except for initial bout of ambulation, when pt's sats dropped to mid 80's (immediately recovered to high 90's with sitting rest break). During ambulation back to room pt appropriately asked for rest break and maintained saturations in high 90's. In rehab gym instructed pt in 2x10 sit<>stands and mini-squats at edge of mat with rest breaks between each task due to decreased endurance. Pt sat in unsupported sitting during rest breaks for increased posutral control. Instructed pt in ascending/descending 5 stairs w/ 1 rail and straight cane and Min Guard assist. Pt left seated in recliner w/ wife present and all needs within reach.   Session 2: Pt received seated in recliner, agreeable to participate in seated level therapy for UE strengthening, as pt was fatigued from breathing treatment and meeting with surgical team re: PEG tube. Pt engaged in 2x10 AROM for shoulder flexion, scaption, elbow flexion, horizontal abduction, and chair pushups with graded assist from therapist. Pt tolerated  session well. Pt left seated in recliner w/ all needs within reach and wife present.   Therapy Documentation Precautions:  Precautions Precautions: Fall, Other (comment) Precaution Comments: O2 via [place]; NG tube Restrictions Weight Bearing Restrictions: No Other Position/Activity Restrictions: HOB elevated due to continuous tube feeds. Pain:  No/denies pain  See FIM for current functional status  Therapy/Group: Individual Therapy  Rada Hay  Rada Hay, PT, DPT 12/25/2014, 7:47 AM

## 2014-12-25 NOTE — Consult Note (Signed)
Chief Complaint: Severe dysphagia  Referring Physician(s): Dr Naaman Plummer  History of Present Illness: Craig [name] is a 74 y.o. male   Exacerbation/crisis myasthenia gravis Severe dysphagia per swallow study Malnutrition  Rec: alternate means of feeding/nutrition Aspiration risk MD has requested consult for percutaneous gastric tube placement CT Abd w/o cx ordered to evaluate anatomy I have seen and examined pt Appropriate for procedure if anatomy conducive  Hx atrial fib: on Xarelto: 2/11 and 2/12 dose Held Wbc high secondary Prednisone afeb  Past Medical History  Diagnosis Date  . Atrial fibrillation, chronic     on Xarelto   . HTN (hypertension)   . CHF (congestive heart failure)   . Diabetes mellitus   . 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 Abdominal wall metastasis removal      Allergies: Review of patient's allergies indicates no known allergies.  Medications: Prior to Admission medications   Medication Sig Start Date End Date Taking? Authorizing Provider  amiodarone (PACERONE) 100 MG tablet Place 1 tablet (100 mg total) into feeding tube daily. 12/17/14   Delfina Redwood, MD  antiseptic oral rinse (CPC / CETYLPYRIDINIUM CHLORIDE 0.05%) 0.05 % LIQD solution 7 mLs by Mouth Rinse route QID. 12/17/14   Delfina Redwood, MD  artificial tears (LACRILUBE) OINT ophthalmic ointment Place into both eyes every 4 (four) hours as needed for dry eyes. 12/17/14   Delfina Redwood, MD  atorvastatin (LIPITOR) 40 MG tablet Place 1 tablet (40 mg total) into feeding tube daily. 12/17/14   Delfina Redwood, MD  chlorhexidine (PERIDEX) 0.12 % solution 15 mLs by Mouth Rinse route 2 (two) times daily. 12/17/14   Delfina Redwood, MD  insulin aspart (NOVOLOG) 100 UNIT/ML injection Inject 0-20 Units into the skin every 4 (four) hours. 12/17/14   Delfina Redwood, MD    ipratropium (ATROVENT HFA) 17 MCG/ACT inhaler Inhale 1 puff into the lungs every 6 (six) hours as needed for wheezing.    Historical Provider, MD  metoprolol tartrate (LOPRESSOR) 25 MG tablet Place 0.5 tablets (12.5 mg total) into feeding tube daily. 12/17/14   Delfina Redwood, MD  metroNIDAZOLE (FLAGYL) 500 MG tablet Take 1 tablet (500 mg total) by mouth every 8 (eight) hours. Through 12/24/14, then stop 12/17/14   Delfina Redwood, MD  Nutritional Supplements (FEEDING SUPPLEMENT, VITAL AF 1.2 CAL,) LIQD Place 1,000 mLs into feeding tube continuous. 12/17/14   Delfina Redwood, MD  predniSONE (DELTASONE) 20 MG tablet Place 3 tablets (60 mg total) into feeding tube daily with breakfast. 12/17/14   Delfina Redwood, MD  pyridostigmine (MESTINON) 60 MG tablet 1.5 tablets (90 mg total) by Per NG tube route 4 (four) times daily. 12/17/14   Delfina Redwood, MD  rivaroxaban (XARELTO) 20 MG TABS tablet Take 20 mg by mouth daily with supper.    Historical Provider, MD  Water For Irrigation, Sterile (FREE WATER) SOLN Place 200 mLs into feeding tube every 4 (four) hours. 12/17/14   Delfina Redwood, MD    History reviewed. No pertinent family history.  History   Social History  . Marital Status: Married    Spouse Name: N/A  . Number of Children: N/A  . Years of Education: N/A   Social History Main Topics  . Smoking status: Never Smoker   . Smokeless tobacco: Not on file  . Alcohol Use: No  .  Drug Use: No  . Sexual Activity: Not on file   Other Topics Concern  . None   Social History Narrative     Review of Systems: A 12 point ROS discussed and pertinent positives are indicated in the HPI above.  All other systems are negative.  Review of Systems  Constitutional: Positive for activity change, appetite change and fatigue. Negative for fever.  HENT: Positive for trouble swallowing. Negative for drooling.   Respiratory: Positive for choking. Negative for cough and shortness of breath.    Cardiovascular: Negative for chest pain.  Gastrointestinal: Negative for abdominal pain.  Neurological: Positive for weakness.  Psychiatric/Behavioral: Negative for behavioral problems and confusion.    Vital Signs: BP 103/52 mmHg  Pulse 71  Temp(Src) 97.8 F (36.6 C) (Oral)  Resp 18  Ht 5\' 7"  (1.702 m)  Wt 108.3 kg (238 lb 12.1 oz)  BMI 37.39 kg/m2  SpO2 98%  Physical Exam  Constitutional: He is oriented to person, place, and time.  Cardiovascular: Normal rate and regular rhythm.   No murmur heard. Pulmonary/Chest: Effort normal and breath sounds normal. He has no wheezes.  Abdominal: Soft. Bowel sounds are normal. There is no tenderness.  Musculoskeletal: Normal range of motion.  Neurological: He is alert and oriented to person, place, and time.  Skin: Skin is warm and dry.  Psychiatric: He has a normal mood and affect. His behavior is normal. Judgment and thought content normal.  Nursing note and vitals reviewed.   Imaging: Dg Chest Port 1 View  12/11/2014   CLINICAL DATA:  Follow-up of respiratory failure, history of CHF, myasthenia gravis, and bladder malignancy  EXAM: PORTABLE CHEST - 1 VIEW  COMPARISON:  Portable chest x-ray of December 09, 2014  FINDINGS: Significant change in positioning is noted on today's study. There is minimal increased density above the dome of the right hemidiaphragm consistent with atelectasis. On the left the retrocardiac region is dense. The left lateral costophrenic angle is excluded from the study. The heart is top-normal in size. The pulmonary vascularity is not engorged. The right internal jugular venous catheter tip projects over the proximal third of the SVC. The Port-A-Cath appliance tip projects over the middle third of the SVC. The esophagogastric tube tip projects below the inferior margin of the image.  IMPRESSION: Bibasilar atelectasis has developed. There is no evidence of CHF. No large pleural effusion is demonstrated. The support  tubes and lines are in appropriate position.   Electronically Signed   By: David  Martinique   On: 12/11/2014 08:01   Dg Chest Port 1 View  12/09/2014   CLINICAL DATA:  Respiratory failure.  EXAM: PORTABLE CHEST - 1 VIEW  COMPARISON:  December 08, 2014.  FINDINGS: Stable cardiomediastinal silhouette. Endotracheal tube has been removed. Nasogastric tube is seen entering stomach. Stable left subclavian Port-A-Cath is noted with distal tip at expected position of cavoatrial junction. Stable right internal jugular catheter is noted with tip in expected position of the SVC. No pneumothorax or significant pleural effusion is noted. Minimal bibasilar subsegmental atelectasis is noted. Bony thorax is intact.  IMPRESSION: Minimal bibasilar subsegmental atelectasis. Endotracheal tube has been removed. Otherwise stable support apparatus.   Electronically Signed   By: Sabino Dick M.D.   On: 12/09/2014 08:03   Dg Chest Port 1 View  12/08/2014   CLINICAL DATA:  Respiratory failure.  Hypoxia.  EXAM: PORTABLE CHEST - 1 VIEW  COMPARISON:  12/07/2014.  FINDINGS: Endotracheal tube has been withdrawn, its tip is 2.8  cm above the carina. Right IJ line, power port catheter in stable position. Bibasilar subsegmental atelectasis and/or infiltrates again noted these findings have progressed. Small left pleural effusion. No pneumothorax. No acute osseous abnormality.  IMPRESSION: 1. Endotracheal tube has been withdrawn, its tip is 2.8 cm above the carina. Right IJ line and power port catheter stable position. 2. Interim progression of bibasilar subsegmental atelectasis and/or infiltrates. Small left pleural effusion cannot be excluded.   Electronically Signed   By: Marcello Moores  Register   On: 12/08/2014 07:49   Dg Chest Port 1 View  12/07/2014   CLINICAL DATA:  Acute respiratory failure with hypoxia  EXAM: PORTABLE CHEST - 1 VIEW  COMPARISON:  12/06/2014  FINDINGS: Endotracheal tube with tip at the carina or right mainstem bronchus orifice.  Left subclavian porta catheter and right IJ catheter are in stable position. Gastric suction tube continues into the stomach.  Worsening retrocardiac aeration, likely atelectasis. No edema, effusion, or pneumothorax. Heart size and mediastinal contours are stable.  These results were called by telephone at the time of interpretation on 12/07/2014 at 6:21 am to [place] , who verbally acknowledged these results.  IMPRESSION: 1. Endotracheal tube tip at the carina or proximal right mainstem. Recommend retraction by 3 cm. 2. New retrocardiac atelectasis, possibly exacerbated by #1.   Electronically Signed   By: Jorje Guild M.D.   On: 12/07/2014 06:21   Dg Chest Port 1 View  12/06/2014   CLINICAL DATA:  Acute respiratory failure  EXAM: PORTABLE CHEST - 1 VIEW  COMPARISON:  12/05/2014  FINDINGS: Mildly low endotracheal tube, tip 1.5 cm above the carina. Left subclavian porta catheter and right IJ central line are in unchanged position. A gastric suction tube reaches the stomach at least.  Unchanged hypoaeration with bibasilar atelectasis, more extensive on the left. Cannot exclude a trace left pleural effusion. No pneumothorax.  IMPRESSION: 1. Lower endotracheal tube, tip 1.5 cm above the carina. 2. Unchanged bibasilar atelectasis.   Electronically Signed   By: Jorje Guild M.D.   On: 12/06/2014 06:21   Dg Chest Port 1 View  12/05/2014   CLINICAL DATA:  Central line placement.  Initial encounter.  EXAM: PORTABLE CHEST - 1 VIEW  COMPARISON:  Chest radiograph performed earlier today at 11:20 a.m.  FINDINGS: The patient's endotracheal tube is seen ending 2 cm above the carina. A left-sided chest port is noted ending about the distal SVC. An enteric tube is noted extending overlying the body of the stomach. A right IJ sheath is seen ending overlying the mid SVC.  Lung expansion is mildly improved. Mild left basilar opacity may reflect atelectasis or possibly mild pneumonia. No pleural effusion or  pneumothorax is seen.  The cardiomediastinal silhouette is mildly enlarged. No acute osseous abnormalities are identified.  IMPRESSION: 1. Right IJ sheath seen ending about the mid SVC. 2. Endotracheal tube noted ending 2 cm above the carina. 3. Enteric tube seen extending overlying the body of the stomach. 4. Lungs mildly hypoexpanded. Mild left basilar airspace opacity may reflect atelectasis or possibly mild pneumonia, slightly better characterized than on the prior study. 5. Mild cardiomegaly.   Electronically Signed   By: Garald Balding M.D.   On: 12/05/2014 20:43   Dg Swallowing Func-speech Pathology  12/25/2014    Objective Swallowing Evaluation:    Patient Details  Name: Shomari Scicchitano MRN: [id number] Date of Birth: 1941-01-18  Today's Date: 12/25/2014 Time: SLP Start Time: 0930-SLP Stop Time: 1000 SLP Time Calculation (min) (  ACUTE ONLY): 30 min  Past Medical History:  Past Medical History  Diagnosis Date  . Atrial fibrillation, chronic     on Xarelto   . HTN (hypertension)   . CHF (congestive heart failure)   . Diabetes mellitus   . 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 Abdominal wall metastasis removal     HPI:  HPI: [name], never smoker, transferred to [facility] ([place] on 1/22 with acute MG  exacerbation & respiratory failure. Decompensated requiring intubation  1/22-1/25.  Now NPO with NG tube due to continued overt s/s of aspiration  during PO trials .  MBS ordered today to objectively determine readiness  for initiation of PO diet.      Assessment / Plan / Recommendation CHL IP CLINICAL IMPRESSIONS 12/25/2014  Dysphagia Diagnosis Severe pharyngeal phase dysphagia;Moderate oral phase  dysphagia  Clinical impression Pt presents with a moderately severe oropharyngeal  dysphagia with both sensory and motor components.  Oral phase impairments  are characterized by generalized weakness, most  pronounced in lingual  strength and range of motion.  This resulted in weak manipulation of  boluses with reduced posterior propulsion of materials to the oropharynx.   Pt also presents with decreased base of tongue retraction, decreased,  hyolaryngeal excursion, and reduced pharyngeal constriction to move  materials through the pharynx and achieve adequate airway protection.  The  abovementioned deficits resulted in residue at the base of tongue,  posterior pharyngeal wall, vallecula and the pyriforms, as well as delayed  swallow initiation with swallow response triggered most consistently at  the pyriforms.  Delayed swallow initiation resulted in aspiration of honey  thick, nectar thick, and thin liquids which pt was unable to effectively  clear via reflexive or volitional coughs.  Cuing for extra effortful  swallows was largely ineffective for clearing residue from the pharynx.   Pt also demonstrated deep penetration to the level of the cords with  pureed solid consistencies and increased pyriform residue which increases  his risk of aspiration post swallow.  Given the abovementioned deficits,  pt's significantly weakened state, and his anxiety regarding PO intake,  recommend that pt remain NPO and that medical team consider PEG placement.   PA made aware.  Prognosis for advancement good with continued trials of  ice chips and time for recovery.  Pharyngeal strengthening exercises are  most likely contraindicated due to the progressive nature of pt's  weakness.         CHL IP TREATMENT RECOMMENDATION 12/12/2014  Treatment Plan Recommendations See plan of care      CHL IP DIET RECOMMENDATION 12/25/2014  Diet Recommendations Alternative means - long-term;NPO  Liquid Administration via (None)  Medication Administration Via alternative means  Compensations (None)  Postural Changes and/or Swallow Maneuvers (None)     CHL IP OTHER RECOMMENDATIONS 12/25/2014  Recommended Consults (None)  Oral Care Recommendations Oral care  Q4 per protocol  Other Recommendations Have oral suction available               CHL IP REASON FOR REFERRAL 12/25/2014  Reason for Referral Objectively evaluate swallowing function     CHL IP ORAL PHASE 12/25/2014  Oral Phase  Impaired                          Oral - Honey Teaspoon Weak lingual  manipulation;Lingual pumping;Incomplete  tongue to palate contact;Reduced posterior propulsion;Piecemeal  swallowing;Delayed oral transit;Lingual/palatal residue        Oral - Nectar Teaspoon Lingual pumping;Incomplete tongue to palate  contact;Reduced posterior propulsion;Weak lingual manipulation;Piecemeal  swallowing;Delayed oral transit;Lingual/palatal residue              Oral - Thin Teaspoon Lingual pumping;Incomplete tongue to palate  contact;Reduced posterior propulsion;Weak lingual manipulation;Piecemeal  swallowing;Delayed oral transit;Lingual/palatal residue           Oral - Puree Lingual pumping;Incomplete tongue to palate contact;Reduced  posterior propulsion;Weak lingual manipulation;Piecemeal  swallowing;Delayed oral transit;Lingual/palatal residue                     CHL IP PHARYNGEAL PHASE 12/25/2014  Pharyngeal Phase Impaired              Pharyngeal - Honey Teaspoon Delayed swallow initiation;Premature spillage  to pyriform sinuses;Reduced laryngeal elevation;Reduced tongue base  retraction;Reduced epiglottic inversion;Reduced anterior laryngeal  mobility;Reduced airway/laryngeal closure;Reduced pharyngeal  peristalsis;Penetration/Aspiration during swallow;Pharyngeal residue -  pyriform sinuses;Pharyngeal residue - posterior pharnyx  Penetration/Aspiration details (honey teaspoon) Material enters airway,  passes BELOW cords and not ejected out despite cough attempt by patient              Pharyngeal - Nectar Teaspoon Delayed swallow initiation;Premature spillage  to pyriform sinuses;Reduced epiglottic inversion;Reduced laryngeal  elevation;Reduced tongue base retraction;Penetration/Aspiration during   swallow;Pharyngeal residue - pyriform sinuses;Pharyngeal residue -  posterior pharnyx;Reduced airway/laryngeal closure;Reduced anterior  laryngeal mobility;Reduced pharyngeal peristalsis  Penetration/Aspiration details (nectar teaspoon) Material enters airway,  passes BELOW cords and not ejected out despite cough attempt by patient                          Pharyngeal - Thin Teaspoon Reduced epiglottic inversion;Reduced laryngeal  elevation;Premature spillage to pyriform sinuses;Delayed swallow  initiation;Reduced tongue base retraction;Reduced airway/laryngeal  closure;Reduced anterior laryngeal mobility;Penetration/Aspiration during  swallow;Pharyngeal residue - pyriform sinuses  Penetration/Aspiration details (thin teaspoon) Material enters airway,  passes BELOW cords and not ejected out despite cough attempt by patient                    Pharyngeal - Puree Delayed swallow initiation;Premature spillage to  valleculae;Reduced tongue base retraction;Reduced laryngeal  elevation;Reduced epiglottic inversion;Pharyngeal residue -  valleculae;Pharyngeal residue - pyriform sinuses;Pharyngeal residue -  posterior pharnyx;Penetration/Aspiration during swallow  Penetration/Aspiration details (puree) Material enters airway, CONTACTS  cords and not ejected out                                        Page, Elmyra Ricks L 12/25/2014, 10:30 AM     Labs:  CBC:  Recent Labs  12/13/14 1101  12/15/14 0428 12/16/14 0041 12/16/14 0510 12/18/14 0500  WBC 22.8*  --  20.7*  --  18.5* 19.3*  HGB 16.2  < > 14.0 14.3 13.1 13.1  HCT 47.4  < > 40.1 42.0 38.8* 38.2*  PLT 160  --  112*  --  128* 175  < > = values in this interval not displayed.  COAGS:  Recent Labs  12/05/14 2015 12/09/14 0400  INR 1.24 1.44    BMP:  Recent Labs  12/16/14 0510 12/18/14 0500 12/19/14 0615 12/25/14 0600  NA 144 141 143 141  K 3.2* 3.4* 3.1* 3.7  CL 111 102 108 104  CO2 29 34* 30  32  GLUCOSE 150* 132* 169* 130*  BUN 13 13 16 11     CALCIUM 8.6 9.0 8.7 8.7  CREATININE 0.61 0.55 0.60 0.71  GFRNONAA >90 >90 >90 >90  GFRAA >90 >90 >90 >90    LIVER FUNCTION TESTS:  Recent Labs  12/05/14 2015 12/06/14 0357 12/18/14 0500  BILITOT 0.9 0.9 0.8  AST 42* 18 32  ALT 45 20 33  ALKPHOS 70 30* 36*  PROT 6.8 5.4* 4.4*  ALBUMIN 3.6 4.2 3.5    TUMOR MARKERS: No results for input(s): AFPTM, CEA, CA199, CHROMGRNA in the last 8760 hours.  Assessment and Plan:  Hx Myasthenia gravis Recent crisis Severe dysphagia Malnutrition Plan for percutaneous gastric tube in am (dependent on CT Abd result) Pt and wife aware of procedure benefits and risks including but not limited to:  Infection; bleeding; organ damage; damage to surrounding structures Agreeable to proceed Consent signed andin chart Will check CT abd asap afeb HOLD Xarelto 2/11 and 2/12 Ancef on call   Thank you for this interesting consult.  I greatly enjoyed meeting Garl Schwabe and look forward to participating in their care.  Signed: Sharnee Douglass A 12/25/2014, 1:40 PM   I spent a total of 40 minutes face to face in clinical consultation, greater than 50% of which was counseling/coordinating care for percutaneous gastric tube placement

## 2014-12-26 ENCOUNTER — Inpatient Hospital Stay (HOSPITAL_COMMUNITY): Payer: PPO | Admitting: *Deleted

## 2014-12-26 ENCOUNTER — Inpatient Hospital Stay (HOSPITAL_COMMUNITY): Payer: PPO

## 2014-12-26 ENCOUNTER — Inpatient Hospital Stay (HOSPITAL_COMMUNITY): Payer: PPO | Admitting: Speech Pathology

## 2014-12-26 ENCOUNTER — Encounter (HOSPITAL_COMMUNITY): Payer: PPO | Admitting: Occupational Therapy

## 2014-12-26 LAB — PROTIME-INR
INR: 1.08 (ref 0.00–1.49)
Prothrombin Time: 14.1 seconds (ref 11.6–15.2)

## 2014-12-26 LAB — GLUCOSE, CAPILLARY
GLUCOSE-CAPILLARY: 88 mg/dL (ref 70–99)
GLUCOSE-CAPILLARY: 112 mg/dL — AB (ref 70–99)
Glucose-Capillary: 67 mg/dL — ABNORMAL LOW (ref 70–99)
Glucose-Capillary: 133 mg/dL — ABNORMAL HIGH (ref 70–99)
Glucose-Capillary: 187 mg/dL — ABNORMAL HIGH (ref 70–99)
Glucose-Capillary: 189 mg/dL — ABNORMAL HIGH (ref 70–99)

## 2014-12-26 LAB — CBC
HCT: 37.3 % — ABNORMAL LOW (ref 39.0–52.0)
HEMOGLOBIN: 12.5 g/dL — AB (ref 13.0–17.0)
MCH: 31.1 pg (ref 26.0–34.0)
MCHC: 33.5 g/dL (ref 30.0–36.0)
MCV: 92.8 fL (ref 78.0–100.0)
Platelets: 226 10*3/uL (ref 150–400)
RBC: 4.02 MIL/uL — ABNORMAL LOW (ref 4.22–5.81)
RDW: 15 % (ref 11.5–15.5)
WBC: 10.1 10*3/uL (ref 4.0–10.5)

## 2014-12-26 LAB — APTT: aPTT: 27 seconds (ref 24–37)

## 2014-12-26 MED ORDER — CEFAZOLIN SODIUM-DEXTROSE 2-3 GM-% IV SOLR
2.0000 g | Freq: Three times a day (TID) | INTRAVENOUS | Status: DC
  Administered 2014-12-26: 2 g via INTRAVENOUS
  Filled 2014-12-26 (×3): qty 50

## 2014-12-26 MED ORDER — MIDAZOLAM HCL 2 MG/2ML IJ SOLN
INTRAMUSCULAR | Status: AC
  Filled 2014-12-26: qty 2

## 2014-12-26 MED ORDER — IOHEXOL 300 MG/ML  SOLN
50.0000 mL | Freq: Once | INTRAMUSCULAR | Status: AC | PRN
  Administered 2014-12-26: 15 mL

## 2014-12-26 MED ORDER — FENTANYL CITRATE 0.05 MG/ML IJ SOLN
INTRAMUSCULAR | Status: AC | PRN
  Administered 2014-12-26: 50 ug via INTRAVENOUS

## 2014-12-26 MED ORDER — CEFAZOLIN SODIUM-DEXTROSE 2-3 GM-% IV SOLR
2.0000 g | INTRAVENOUS | Status: DC
  Filled 2014-12-26: qty 50

## 2014-12-26 MED ORDER — HYDROCODONE-ACETAMINOPHEN 5-325 MG PO TABS: 1.0000 | ORAL_TABLET | ORAL | Status: DC | PRN

## 2014-12-26 MED ORDER — NITROGLYCERIN 0.4 MG SL SUBL
0.4000 mg | SUBLINGUAL_TABLET | SUBLINGUAL | Status: DC | PRN
  Administered 2014-12-26: 0.4 mg via SUBLINGUAL

## 2014-12-26 MED ORDER — HYDROCODONE-ACETAMINOPHEN 7.5-325 MG/15ML PO SOLN
10.0000 mL | ORAL | Status: DC | PRN
  Administered 2014-12-26: 20 mL
  Administered 2014-12-26: 10 mL
  Filled 2014-12-26: qty 30
  Filled 2014-12-26: qty 15

## 2014-12-26 MED ORDER — RIVAROXABAN 20 MG PO TABS: 20.0000 mg | ORAL_TABLET | Freq: Every day | ORAL | Status: DC

## 2014-12-26 MED ORDER — LIDOCAINE HCL 1 % IJ SOLN
INTRAMUSCULAR | Status: AC
  Filled 2014-12-26: qty 20

## 2014-12-26 MED ORDER — FENTANYL CITRATE 0.05 MG/ML IJ SOLN
INTRAMUSCULAR | Status: AC
  Filled 2014-12-26: qty 2

## 2014-12-26 MED ORDER — ONDANSETRON HCL 4 MG/2ML IJ SOLN: 4.0000 mg | INTRAMUSCULAR | Status: DC | PRN

## 2014-12-26 MED ORDER — NITROGLYCERIN 0.4 MG SL SUBL
SUBLINGUAL_TABLET | SUBLINGUAL | Status: AC
  Filled 2014-12-26: qty 1

## 2014-12-26 MED ORDER — HYDROMORPHONE HCL 1 MG/ML IJ SOLN
1.0000 mg | INTRAMUSCULAR | Status: DC | PRN
  Administered 2014-12-26: 1 mg via INTRAVENOUS
  Filled 2014-12-26: qty 1

## 2014-12-26 MED ORDER — MIDAZOLAM HCL 2 MG/2ML IJ SOLN
INTRAMUSCULAR | Status: AC | PRN
  Administered 2014-12-26 (×2): 1 mg via INTRAVENOUS

## 2014-12-26 MED ORDER — CEFAZOLIN SODIUM-DEXTROSE 2-3 GM-% IV SOLR
INTRAVENOUS | Status: AC
  Filled 2014-12-26: qty 50

## 2014-12-26 MED ORDER — PREDNISONE 5 MG/5ML PO SOLN
60.0000 mg | Freq: Every day | ORAL | Status: DC
Start: 2014-12-27 — End: 2014-12-27
  Administered 2014-12-26: 60 mg
  Filled 2014-12-26: qty 60

## 2014-12-26 NOTE — Progress Notes (Signed)
[facility]     PROGRESS NOTE    Subjective/Complaints: Slept well. Expectedly disappointed about swallowing A  review of systems has been performed and if not noted above is otherwise negative.  Objective: Vital Signs: Blood pressure 106/67, pulse 68, temperature 98.7 F (37.1 C), temperature source Oral, resp. rate 18, height 5\' 7"  (1.702 m), weight 103.9 kg (229 lb 0.9 oz), SpO2 94 %. Ct Abdomen Wo Contrast  12/25/2014   CLINICAL DATA:  Initial encounter for evaluate anatomy secondary to G-tube placement. 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 Dysphagia.  EXAM: CT ABDOMEN WITHOUT CONTRAST  TECHNIQUE: Multidetector CT imaging of the abdomen was performed following the standard protocol without IV contrast.  COMPARISON:  12/02/2014  FINDINGS: Lower chest: Dependent left base atelectasis. Trace left pleural fluid or thickening, new. Mild cardiomegaly with coronary artery atherosclerosis.  Hepatobiliary: Old granulomatous disease in the liver. Contracted gallbladder, without biliary ductal dilatation.  Pancreas: Fatty replacement involving the pancreatic head and uncinate process. No duct dilatation or acute pancreatitis.  Spleen: Normal  Adrenals/Urinary Tract: Normal adrenal glands. No renal calculi or hydronephrosis. No abdominal hydroureter or ureteric stone.  Stomach/Bowel: Nasogastric tube terminates at the body of the stomach. The gastric body is positioned anteriorly, without interposition of colon or small bowel. There is no omental nodularity in the region of the greater curvature of the stomach. No abnormal gastric wall thickening.  Extensive colonic diverticulosis. Contrast identified throughout the colon. Small bowel loops and appendix are not well opacified. Small bowel is normal in caliber.  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 1.9 cm on image 34 versus similar (when remeasured). Aortic  and branch vessel atherosclerosis. No retroperitoneal or retrocrural adenopathy.  Other: No ascites. Omental nodularity is again identified. An 11 mm nodule on image 54 likely represents enlargement of a 3 mm nodule on image 51 of the prior exam. Right-sided abdominal omental nodule measures 1.5 cm on image 50, enlarged from 9 mm on the prior exam (when remeasured).  Musculoskeletal: Degenerative disc disease, most advanced at L4-5 and L1-2. Convex right lumbar spine curvature.  IMPRESSION: 1. Progressive omental/peritoneal metastasis. 2. No disease identified along the anterior aspect of the stomach to preclude gastrostomy tube placement. 3. Celiac origin aneurysm, similar. 4. Left base atelectasis with adjacent trace left pleural fluid or thickening, new.   Electronically Signed   By: Abigail Miyamoto M.D.   On: 12/25/2014 16:34   Dg Swallowing Func-speech Pathology  12/25/2014    Objective Swallowing Evaluation:    Patient Details  Name: Essa Malachi MRN: [id number] Date of Birth: 1941-09-18  Today's Date: 12/25/2014 Time: SLP Start Time: 0930-SLP Stop Time: 1000 SLP Time Calculation (min) (ACUTE ONLY): 30 min  Past Medical History:  Past Medical History  Diagnosis Date  . Atrial fibrillation, chronic     on Xarelto   . HTN (hypertension)   . CHF (congestive heart failure)   . Diabetes mellitus   . 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 Abdominal wall metastasis removal     HPI:  HPI: [name], never smoker, transferred to [facility] on 1/22 with acute MG  exacerbation & respiratory failure. Decompensated requiring intubation  1/22-1/25.  Now NPO with NG tube due to continued overt s/s of aspiration  during PO trials .  MBS ordered  today to objectively determine readiness  for initiation of PO diet.      Assessment / Plan / Recommendation CHL IP CLINICAL IMPRESSIONS 12/25/2014  Dysphagia Diagnosis  Severe pharyngeal phase dysphagia;Moderate oral phase  dysphagia  Clinical impression Pt presents with a moderately severe oropharyngeal  dysphagia with both sensory and motor components.  Oral phase impairments  are characterized by generalized weakness, most pronounced in lingual  strength and range of motion.  This resulted in weak manipulation of  boluses with reduced posterior propulsion of materials to the oropharynx.   Pt also presents with decreased base of tongue retraction, decreased,  hyolaryngeal excursion, and reduced pharyngeal constriction to move  materials through the pharynx and achieve adequate airway protection.  The  abovementioned deficits resulted in residue at the base of tongue,  posterior pharyngeal wall, vallecula and the pyriforms, as well as delayed  swallow initiation with swallow response triggered most consistently at  the pyriforms.  Delayed swallow initiation resulted in aspiration of honey  thick, nectar thick, and thin liquids which pt was unable to effectively  clear via reflexive or volitional coughs.  Cuing for extra effortful  swallows was largely ineffective for clearing residue from the pharynx.   Pt also demonstrated deep penetration to the level of the cords with  pureed solid consistencies and increased pyriform residue which increases  his risk of aspiration post swallow.  Given the abovementioned deficits,  pt's significantly weakened state, and his anxiety regarding PO intake,  recommend that pt remain NPO and that medical team consider PEG placement.   PA made aware.  Prognosis for advancement good with continued trials of  ice chips and time for recovery.  Pharyngeal strengthening exercises are  most likely contraindicated due to the progressive nature of pt's  weakness.         CHL IP TREATMENT RECOMMENDATION 12/12/2014  Treatment Plan Recommendations See plan of care      CHL IP DIET RECOMMENDATION 12/25/2014  Diet Recommendations Alternative means - long-term;NPO   Liquid Administration via (None)  Medication Administration Via alternative means  Compensations (None)  Postural Changes and/or Swallow Maneuvers (None)     CHL IP OTHER RECOMMENDATIONS 12/25/2014  Recommended Consults (None)  Oral Care Recommendations Oral care Q4 per protocol  Other Recommendations Have oral suction available               CHL IP REASON FOR REFERRAL 12/25/2014  Reason for Referral Objectively evaluate swallowing function     CHL IP ORAL PHASE 12/25/2014  Oral Phase  Impaired                          Oral - Honey Teaspoon Weak lingual manipulation;Lingual pumping;Incomplete  tongue to palate contact;Reduced posterior propulsion;Piecemeal  swallowing;Delayed oral transit;Lingual/palatal residue        Oral - Nectar Teaspoon Lingual pumping;Incomplete tongue to palate  contact;Reduced posterior propulsion;Weak lingual manipulation;Piecemeal  swallowing;Delayed oral transit;Lingual/palatal residue              Oral - Thin Teaspoon Lingual pumping;Incomplete tongue to palate  contact;Reduced posterior propulsion;Weak lingual manipulation;Piecemeal  swallowing;Delayed oral transit;Lingual/palatal residue           Oral - Puree Lingual pumping;Incomplete tongue to palate contact;Reduced  posterior propulsion;Weak lingual manipulation;Piecemeal  swallowing;Delayed oral transit;Lingual/palatal residue                     CHL IP PHARYNGEAL PHASE 12/25/2014  Pharyngeal Phase  Impaired              Pharyngeal - Honey Teaspoon Delayed swallow initiation;Premature spillage  to pyriform sinuses;Reduced laryngeal elevation;Reduced tongue base  retraction;Reduced epiglottic inversion;Reduced anterior laryngeal  mobility;Reduced airway/laryngeal closure;Reduced pharyngeal  peristalsis;Penetration/Aspiration during swallow;Pharyngeal residue -  pyriform sinuses;Pharyngeal residue - posterior pharnyx  Penetration/Aspiration details (honey teaspoon) Material enters airway,  passes BELOW cords and not ejected out despite  cough attempt by patient              Pharyngeal - Nectar Teaspoon Delayed swallow initiation;Premature spillage  to pyriform sinuses;Reduced epiglottic inversion;Reduced laryngeal  elevation;Reduced tongue base retraction;Penetration/Aspiration during  swallow;Pharyngeal residue - pyriform sinuses;Pharyngeal residue -  posterior pharnyx;Reduced airway/laryngeal closure;Reduced anterior  laryngeal mobility;Reduced pharyngeal peristalsis  Penetration/Aspiration details (nectar teaspoon) Material enters airway,  passes BELOW cords and not ejected out despite cough attempt by patient                          Pharyngeal - Thin Teaspoon Reduced epiglottic inversion;Reduced laryngeal  elevation;Premature spillage to pyriform sinuses;Delayed swallow  initiation;Reduced tongue base retraction;Reduced airway/laryngeal  closure;Reduced anterior laryngeal mobility;Penetration/Aspiration during  swallow;Pharyngeal residue - pyriform sinuses  Penetration/Aspiration details (thin teaspoon) Material enters airway,  passes BELOW cords and not ejected out despite cough attempt by patient                    Pharyngeal - Puree Delayed swallow initiation;Premature spillage to  valleculae;Reduced tongue base retraction;Reduced laryngeal  elevation;Reduced epiglottic inversion;Pharyngeal residue -  valleculae;Pharyngeal residue - pyriform sinuses;Pharyngeal residue -  posterior pharnyx;Penetration/Aspiration during swallow  Penetration/Aspiration details (puree) Material enters airway, CONTACTS  cords and not ejected out                                        Emilio Math 12/25/2014, 10:30 AM     Recent Labs  12/26/14 0525  WBC 10.1  HGB 12.5*  HCT 37.3*  PLT 226    Recent Labs  12/25/14 0600  NA 141  K 3.7  CL 104  GLUCOSE 130*  BUN 11  CREATININE 0.71  CALCIUM 8.7   CBG (last 3)   Recent Labs  12/25/14 1956 12/25/14 2346 12/26/14 0354  GLUCAP 170* 128* 67*    Wt Readings from Last 3 Encounters:   12/26/14 103.9 kg (229 lb 0.9 oz)  12/17/14 107.4 kg (236 lb 12.4 oz)  05/03/10 131.543 kg (290 lb)    Physical Exam:  Nursing note and vitals reviewed. Constitutional: He appears well-developed and well-nourished. Appears fatigued still Panda in place left nare.  HENT: BiBap mask secured Head: Normocephalic and atraumatic.  Eyes: Left conjunctiva somewhat injected.  Ptosis better  Cardiovascular: Normal rate and regular rhythm.  Respiratory: Effort normal. No respiratory distress. He has no wheezes. He has rhonchi.  GI: Soft. Bowel sounds are normal. He exhibits no distension. There is no tenderness.  Musculoskeletal: He exhibits no edema or tenderness.  Neurological: He is alert.  Facial diplegia improving. Cough and oro-motor control is better..voice still lacks volume Followed motor commands without difficulty. Reasonable insight and awareness. Sensory exam intact to PP and LT. Strength 4/5 UE's bilaterally prox to distal. LE's4-/5 hf, 4/5ke, 4 ankles.  Skin: Skin is warm and dry.  Psychiatric: He has a normal mood and affect. His behavior is normal. Good insight and awareness  Assessment/Plan: 1. 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 Physiatrist is providing close team supervision and 24 hour management of active medical problems listed below. Physiatrist and rehab team continue to assess barriers to discharge/monitor patient progress toward functional and medical goals. FIM: FIM - Bathing Bathing Steps Patient Completed: Chest, Front perineal area, Right lower leg (including foot), Left lower leg (including foot), Buttocks, Right Arm, Left Arm, Right upper leg, Left upper leg, Abdomen Bathing: 4: Min-Patient completes 8-9 38f 10 parts or 75+ percent  FIM - Upper Body Dressing/Undressing Upper body dressing/undressing steps patient completed:  Thread/unthread left sleeve of pullover shirt/dress Upper body dressing/undressing: 2: Max-Patient completed 25-49% of tasks FIM - Lower Body Dressing/Undressing Lower body dressing/undressing steps patient completed: Thread/unthread right underwear leg, Thread/unthread left underwear leg, Pull underwear up/down, Thread/unthread right pants leg, Thread/unthread left pants leg, Pull pants up/down, Don/Doff right sock Lower body dressing/undressing: 4: Min-Patient completed 75 plus % of tasks  FIM - Toileting Toileting steps completed by patient: Adjust clothing prior to toileting, Adjust clothing after toileting Toileting Assistive Devices: Grab bar or rail for support Toileting: 5: Supervision: Safety issues/verbal cues  FIM - Radio producer Devices: Oncologist Transfers: 3-From toilet/BSC: Mod A (lift or lower assist)  FIM - Control and instrumentation engineer Devices: Best boy: 5: Bed > Chair or W/C: Supervision (verbal cues/safety issues), 5: Chair or W/C > Bed: Supervision (verbal cues/safety issues)  FIM - Locomotion: Wheelchair Locomotion: Wheelchair: 0: Activity did not occur FIM - Locomotion: Ambulation Locomotion: Ambulation Assistive Devices: Journalist, newspaper Ambulation/Gait Assistance: 4: Min guard Locomotion: Ambulation: 4: Travels 150 ft or more with minimal assistance (Pt.>75%)  Comprehension Comprehension Mode: Auditory Comprehension: 5-Understands complex 90% of the time/Cues < 10% of the time  Expression Expression Mode: Verbal Expression: 5-Expresses basic 90% of the time/requires cueing < 10% of the time.  Social Interaction Social Interaction: 6-Interacts appropriately with others with medication or extra time (anti-anxiety, antidepressant).  Problem Solving Problem Solving Mode: Asleep Problem Solving: 5-Solves complex 90% of the time/cues < 10% of the time  Memory Memory Mode: Asleep Memory:  5-Recognizes or recalls 90% of the time/requires cueing < 10% of the time   Medical Problem List and Plan: 1. Functional deficits secondary to MG crises 2. DVT Prophylaxis/Anticoagulation: Pharmaceutical: Xarelto (holding for g tube placement) 3. Pain Management: Tylenol prn 4. Mood: Team to provide ego support. LCSW to follow for evaluation and support.  5. Neuropsych: This patient is capable of making decisions on her own behalf. 6. Skin/Wound Care: routine pressure relief measures. Maintain adequate nutrition and hydration status.  7. Fluids/Electrolytes/Nutrition: Continue TF. Added questran for bulking. Continue water flushes every 4 hours.    8. C diff colitis: flagyl completed. added probiotics/fiber. 9. A fib: Monitor heart rate every 8 hours. Continue amiodarone and Xarelto.  10. Diarrhea: Exacerbated by tube feeds and c diff. added [place] daily. Improved 11. Hypokalemia: Add supplement. Level slightly improved today.  12. MG crises: On prednisone 60 mg/daily and mestinon 90 mg qid.   13. Continued oro-pharyngeal dysphagia---G-tube per IR---appreciate their help!  -CT findings will not preclude g-tube placement---does have progressive omental and peritoneal mets (bladder primary)---will d/w onc regarding need to address these now as opposed to after d/c 14. Respiratory: BIPAP working well. On home settings   -nebs prn  -suction, coughing, IS  -weaning oxygen to off  LOS (Days) 9 A FACE TO FACE EVALUATION WAS PERFORMED  Sylvio Weatherall T 12/26/2014 7:02 AM

## 2014-12-26 NOTE — Plan of Care (Signed)
Problem: RH Bathing Goal: LTG Patient will bathe with assist, cues/equipment (OT) LTG: Patient will bathe specified number of body parts with assist with/without cues using equipment (position) (OT)  Downgraded due to A needed with O2 tubing.   Problem: RH Dressing Goal: LTG Patient will perform lower body dressing w/assist (OT) LTG: Patient will perform lower body dressing with assist, with/without cues in positioning using equipment (OT)  Downgraded for safety and for TEDS

## 2014-12-26 NOTE — Sedation Documentation (Signed)
Patient alert, awake, denies pain or discomfort.

## 2014-12-26 NOTE — Progress Notes (Addendum)
Craig [name] is a 74 y.o. male 1941/01/05 [id number]   Subjective: C/o CP starting >1 hr ago mid-sternal w/irrad to L shoulder. He was diaphoretic CP was rated at 6-8/10 in the beginning; now it is 3/10 after 1 mg of IV Dilaudid and SL NTG. His HR was noted to dip down to 40-42 on occasion.  Objective: Vital signs in last 24 hours: Temp:  [97.7 F (36.5 C)-98.7 F (37.1 C)] 97.7 F (36.5 C) (02/12 1445) Pulse Rate:  [66-80] 68 (02/12 2334) Resp:  [18-28] 20 (02/12 1445) BP: (102-136)/(65-83) 104/69 mmHg (02/12 2334) SpO2:  [91 %-99 %] 96 % (02/12 2334) Weight:  [229 lb 0.9 oz (103.9 kg)] 229 lb 0.9 oz (103.9 kg) (02/12 0545) Weight change: -9 lb 11.2 oz (-4.4 kg) Last BM Date: 12/25/14  Intake/Output from previous day: 02/11 0701 - 02/12 0700 In: 200 [NG/GT:200] Out: 725 [Urine:725] Last cbgs: CBG (last 3)   Recent Labs  12/26/14 1617 12/26/14 2018 12/26/14 2236  GLUCAP 133* 187* 189*     Physical Exam General: chronically ill appearing, NAD HEENT: a little dry Lungs: Normal effort. Lungs clear to auscultation, no crackles or wheezes. Decreased BS at bases Cardiovascular: Irreg; (+) edema Abdomen: S/NT/ND; BS(+) - new PEG Musculoskeletal:  unchanged Neurological: No new neurological deficits Wounds: IJ line    Skin: clear  Aging changes Mental state: Alert, oriented, cooperative  EKG Afib  Lab Results: BMET    Component Value Date/Time   NA 141 12/25/2014 0600   K 3.7 12/25/2014 0600   CL 104 12/25/2014 0600   CO2 32 12/25/2014 0600   GLUCOSE 130* 12/25/2014 0600   BUN 11 12/25/2014 0600   CREATININE 0.71 12/25/2014 0600   CALCIUM 8.7 12/25/2014 0600   GFRNONAA >90 12/25/2014 0600   GFRAA >90 12/25/2014 0600   CBC    Component Value Date/Time   WBC 10.1 12/26/2014 0525   RBC 4.02* 12/26/2014 0525   RBC 5.06 12/13/2014 1101   HGB 12.5* 12/26/2014 0525   HCT 37.3* 12/26/2014 0525   PLT 226 12/26/2014 0525   MCV 92.8 12/26/2014 0525   MCH 31.1  12/26/2014 0525   MCHC 33.5 12/26/2014 0525   RDW 15.0 12/26/2014 0525   LYMPHSABS 2.1 12/18/2014 0500   MONOABS 1.2* 12/18/2014 0500   EOSABS 0.1 12/18/2014 0500   BASOSABS 0.0 12/18/2014 0500    Studies/Results: Ct Abdomen Wo Contrast  12/25/2014   CLINICAL DATA:  Initial encounter for evaluate anatomy secondary to G-tube placement. 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 Dysphagia.  EXAM: CT ABDOMEN WITHOUT CONTRAST  TECHNIQUE: Multidetector CT imaging of the abdomen was performed following the standard protocol without IV contrast.  COMPARISON:  12/02/2014  FINDINGS: Lower chest: Dependent left base atelectasis. Trace left pleural fluid or thickening, new. Mild cardiomegaly with coronary artery atherosclerosis.  Hepatobiliary: Old granulomatous disease in the liver. Contracted gallbladder, without biliary ductal dilatation.  Pancreas: Fatty replacement involving the pancreatic head and uncinate process. No duct dilatation or acute pancreatitis.  Spleen: Normal  Adrenals/Urinary Tract: Normal adrenal glands. No renal calculi or hydronephrosis. No abdominal hydroureter or ureteric stone.  Stomach/Bowel: Nasogastric tube terminates at the body of the stomach. The gastric body is positioned anteriorly, without interposition of colon or small bowel. There is no omental nodularity in the region of the greater curvature of the stomach. No abnormal gastric wall thickening.  Extensive colonic diverticulosis. Contrast identified throughout the colon. Small bowel loops and appendix are not well opacified.  Small bowel is normal in caliber.  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 1.9 cm on image 34 versus similar (when remeasured). Aortic and branch vessel atherosclerosis. No retroperitoneal or retrocrural adenopathy.  Other: No ascites. Omental nodularity is again identified. An 11 mm nodule on image 54 likely represents enlargement of a 3 mm  nodule on image 51 of the prior exam. Right-sided abdominal omental nodule measures 1.5 cm on image 50, enlarged from 9 mm on the prior exam (when remeasured).  Musculoskeletal: Degenerative disc disease, most advanced at L4-5 and L1-2. Convex right lumbar spine curvature.  IMPRESSION: 1. Progressive omental/peritoneal metastasis. 2. No disease identified along the anterior aspect of the stomach to preclude gastrostomy tube placement. 3. Celiac origin aneurysm, similar. 4. Left base atelectasis with adjacent trace left pleural fluid or thickening, new.   Electronically Signed   By: Abigail Miyamoto M.D.   On: 12/25/2014 16:34   Ir Gastrostomy Tube Mod Sed  12/26/2014   CLINICAL DATA:  Myasthenia gravis, dysphasia, needs enteral feeding support.  EXAM: PERC PLACEMENT GASTROSTOMY  FLUOROSCOPY TIME:  1 minutes 48 seconds  TECHNIQUE: The procedure, risks, benefits, and alternatives were explained to the patient. Questions regarding the procedure were encouraged and answered. The patient understands and consents to the procedure. As antibiotic prophylaxis, cefazolin was ordered pre-procedure and administered intravenously within one hour of incision.Progression of previously administered oral barium into the colon was confirmed fluoroscopically. A 5 French angiographic catheter was placed as orogastric tube. The upper abdomen was prepped with Betadine, draped in usual sterile fashion, and infiltrated locally with 1% lidocaine.  Intravenous Fentanyl and Versed were administered as conscious sedation during continuous cardiorespiratory monitoring by the radiology RN, with a total moderate sedation time of ten minutes.  Stomach was insufflated using air through the orogastric tube. An 41 French sheath needle was advanced percutaneously into the gastric lumen under fluoroscopy. Gas could be aspirated and a small contrast injection confirmed intraluminal spread. The sheath was exchanged over a guidewire for a 9 [place] vascular  sheath, through which the snare device was advanced and used to snare a guidewire passed through the orogastric tube. This was withdrawn, and the snare attached to the 20 French pull-through gastrostomy tube, which was advanced antegrade, positioned with the internal bumper securing the anterior gastric wall to the anterior abdominal wall. Small contrast injection confirms appropriate positioning. The external bumper was applied and the catheter was flushed.  COMPLICATIONS: COMPLICATIONS none  IMPRESSION: 1. Technically successful 20 French pull-through gastrostomy placement under fluoroscopy.   Electronically Signed   By: Lucrezia Europe M.D.   On: 12/26/2014 11:26   Dg Swallowing Func-speech Pathology  12/25/2014    Objective Swallowing Evaluation:    Patient Details  Name: Craig [name] MRN: [id number] Date of Birth: 11-18-1940  Today's Date: 12/25/2014 Time: SLP Start Time: 0930-SLP Stop Time: 1000 SLP Time Calculation (min) (ACUTE ONLY): 30 min  Past Medical History:  Past Medical History  Diagnosis Date  . Atrial fibrillation, chronic     on Xarelto   . HTN (hypertension)   . CHF (congestive heart failure)   . Diabetes mellitus   . 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 Abdominal wall metastasis removal     HPI:  HPI: [name], never smoker, transferred to [facility] on 1/22 with acute MG  exacerbation &  respiratory failure. Decompensated requiring intubation  1/22-1/25.  Now NPO with NG tube due to continued overt s/s of aspiration  during PO trials .  MBS ordered today to objectively determine readiness  for initiation of PO diet.      Assessment / Plan / Recommendation CHL IP CLINICAL IMPRESSIONS 12/25/2014  Dysphagia Diagnosis Severe pharyngeal phase dysphagia;Moderate oral phase  dysphagia  Clinical impression Pt presents with a moderately severe oropharyngeal  dysphagia with both sensory and  motor components.  Oral phase impairments  are characterized by generalized weakness, most pronounced in lingual  strength and range of motion.  This resulted in weak manipulation of  boluses with reduced posterior propulsion of materials to the oropharynx.   Pt also presents with decreased base of tongue retraction, decreased,  hyolaryngeal excursion, and reduced pharyngeal constriction to move  materials through the pharynx and achieve adequate airway protection.  The  abovementioned deficits resulted in residue at the base of tongue,  posterior pharyngeal wall, vallecula and the pyriforms, as well as delayed  swallow initiation with swallow response triggered most consistently at  the pyriforms.  Delayed swallow initiation resulted in aspiration of honey  thick, nectar thick, and thin liquids which pt was unable to effectively  clear via reflexive or volitional coughs.  Cuing for extra effortful  swallows was largely ineffective for clearing residue from the pharynx.   Pt also demonstrated deep penetration to the level of the cords with  pureed solid consistencies and increased pyriform residue which increases  his risk of aspiration post swallow.  Given the abovementioned deficits,  pt's significantly weakened state, and his anxiety regarding PO intake,  recommend that pt remain NPO and that medical team consider PEG placement.   PA made aware.  Prognosis for advancement good with continued trials of  ice chips and time for recovery.  Pharyngeal strengthening exercises are  most likely contraindicated due to the progressive nature of pt's  weakness.         CHL IP TREATMENT RECOMMENDATION 12/12/2014  Treatment Plan Recommendations See plan of care      CHL IP DIET RECOMMENDATION 12/25/2014  Diet Recommendations Alternative means - long-term;NPO  Liquid Administration via (None)  Medication Administration Via alternative means  Compensations (None)  Postural Changes and/or Swallow Maneuvers (None)     CHL IP OTHER  RECOMMENDATIONS 12/25/2014  Recommended Consults (None)  Oral Care Recommendations Oral care Q4 per protocol  Other Recommendations Have oral suction available               CHL IP REASON FOR REFERRAL 12/25/2014  Reason for Referral Objectively evaluate swallowing function     CHL IP ORAL PHASE 12/25/2014  Oral Phase  Impaired                          Oral - Honey Teaspoon Weak lingual manipulation;Lingual pumping;Incomplete  tongue to palate contact;Reduced posterior propulsion;Piecemeal  swallowing;Delayed oral transit;Lingual/palatal residue        Oral - Nectar Teaspoon Lingual pumping;Incomplete tongue to palate  contact;Reduced posterior propulsion;Weak lingual manipulation;Piecemeal  swallowing;Delayed oral transit;Lingual/palatal residue              Oral - Thin Teaspoon Lingual pumping;Incomplete tongue to palate  contact;Reduced posterior propulsion;Weak lingual manipulation;Piecemeal  swallowing;Delayed oral transit;Lingual/palatal residue           Oral - Puree Lingual pumping;Incomplete tongue to palate contact;Reduced  posterior propulsion;Weak lingual manipulation;Piecemeal  swallowing;Delayed oral transit;Lingual/palatal residue  CHL IP PHARYNGEAL PHASE 12/25/2014  Pharyngeal Phase Impaired              Pharyngeal - Honey Teaspoon Delayed swallow initiation;Premature spillage  to pyriform sinuses;Reduced laryngeal elevation;Reduced tongue base  retraction;Reduced epiglottic inversion;Reduced anterior laryngeal  mobility;Reduced airway/laryngeal closure;Reduced pharyngeal  peristalsis;Penetration/Aspiration during swallow;Pharyngeal residue -  pyriform sinuses;Pharyngeal residue - posterior pharnyx  Penetration/Aspiration details (honey teaspoon) Material enters airway,  passes BELOW cords and not ejected out despite cough attempt by patient              Pharyngeal - Nectar Teaspoon Delayed swallow initiation;Premature spillage  to pyriform sinuses;Reduced epiglottic inversion;Reduced  laryngeal  elevation;Reduced tongue base retraction;Penetration/Aspiration during  swallow;Pharyngeal residue - pyriform sinuses;Pharyngeal residue -  posterior pharnyx;Reduced airway/laryngeal closure;Reduced anterior  laryngeal mobility;Reduced pharyngeal peristalsis  Penetration/Aspiration details (nectar teaspoon) Material enters airway,  passes BELOW cords and not ejected out despite cough attempt by patient                          Pharyngeal - Thin Teaspoon Reduced epiglottic inversion;Reduced laryngeal  elevation;Premature spillage to pyriform sinuses;Delayed swallow  initiation;Reduced tongue base retraction;Reduced airway/laryngeal  closure;Reduced anterior laryngeal mobility;Penetration/Aspiration during  swallow;Pharyngeal residue - pyriform sinuses  Penetration/Aspiration details (thin teaspoon) Material enters airway,  passes BELOW cords and not ejected out despite cough attempt by patient                    Pharyngeal - Puree Delayed swallow initiation;Premature spillage to  valleculae;Reduced tongue base retraction;Reduced laryngeal  elevation;Reduced epiglottic inversion;Pharyngeal residue -  valleculae;Pharyngeal residue - pyriform sinuses;Pharyngeal residue -  posterior pharnyx;Penetration/Aspiration during swallow  Penetration/Aspiration details (puree) Material enters airway, CONTACTS  cords and not ejected out                                        Windell Moulding L 12/25/2014, 10:30 AM     Medications: I have reviewed the patient's current medications.  Assessment/Plan:  New problems:   - CP starting >1 hr ago mid-sternal w/irrad to L shoulder. He was diaphoretic CP was rated at 6-8/10 in the beginning; now it is 3/10 after 1 mg of IV Dilaudid and SL NTG. Cardiac markers are pending.  - Bradycaria - his HR was noted to dip down to 40-42 on occasion. He will need telemetry. Will transfer to [facility] - help is very appreciated!  - S/p PEG tube placement earlier on 12/26/14 -- NPO  11:45 pm  (2/12) - 12:25 am (2/13)   Previous issues:  1. Functional deficits secondary to MG crises 2. DVT Prophylaxis/Anticoagulation: Pharmaceutical: Xarelto (holding for g tube placement) 3. Pain Management: Tylenol prn 4. Mood: Team to provide ego support. LCSW to follow for evaluation and support.  5. Neuropsych: This patient is capable of making decisions on her own behalf. 6. Skin/Wound Care: routine pressure relief measures. Maintain adequate nutrition and hydration status.  7. Fluids/Electrolytes/Nutrition: Continue TF. Added questran for bulking. Continue water flushes every 4 hours.  8. C diff colitis: flagyl completed. added probiotics/fiber. 9. A fib: Monitor heart rate every 8 hours. Continue amiodarone and Xarelto.  10. Diarrhea: Exacerbated by tube feeds and c diff. added [place] daily. Improved 11. Hypokalemia: Add supplement. Level slightly improved today.  12. MG crises: On prednisone 60 mg/daily and mestinon 90 mg qid.  13. Continued oro-pharyngeal dysphagia---G-tube per  IR---appreciate their help! -CT findings will not preclude g-tube placement---does have progressive omental and peritoneal mets (bladder primary)---will d/w onc regarding need to address these now as opposed to after d/c 14. Respiratory: BIPAP working well. On home settings    Length of stay, days: [place] , MD 12/26/2014, 11:59 PM

## 2014-12-26 NOTE — Plan of Care (Signed)
Problem: RH Eating Goal: LTG Patient will perform eating w/assist, cues/equip (OT) LTG: Patient will perform eating with assist, with/without cues using equipment (OT)  D/C because pt d/c with PEG

## 2014-12-26 NOTE — Progress Notes (Signed)
Occupational Therapy Weekly Progress Note  Patient Details  Name: Craig [name] MRN: [id number] Date of Birth: 22-Mar-1941  Beginning of progress report period: December 18, 2014 End of progress report period: December 26, 2014   Patient has met 4 of 5 short term goals.  Pt is progressing well with therapy, and has increased functional activity tolerance, increased functional use of B UEs and increased independence with ADLs. Pt able to initiate teeth brushing  using suction toothbrush, however, requests assist for thourghness with task. Overall, pt is supervision for most ADLs, and demonstrates good safety awareness and ability to verbalize need for assist. Pt's wife has been present for all OT sessions, with good participation. Education regarding AE, safety within the home, energy conservation, benefits of mobility, importance of participation with ADLs/ IADLs and POC discussed during all tx sessions.   Patient continues to demonstrate the following deficits: functional activity tolerance and decreased functional balance for static and dynamic standing/ dressing tasks and therefore will continue to benefit from skilled OT intervention to enhance overall performance with BADL and iADL.  Patient progressing toward long term goals..  Continue plan of care. LTG modified due to planned d/c with O2 and PEG tube placement  OT Short Term Goals Week 1:  OT Short Term Goal 1 (Week 1): Pt will transfer to BSC/ toilet with supervision OT Short Term Goal 1 - Progress (Week 1): Met OT Short Term Goal 2 (Week 1): Pt will perform 3/3 toileting tasks with steady  A OT Short Term Goal 2 - Progress (Week 1): Met OT Short Term Goal 3 (Week 1): Pt will perform toothbrushing (with suction) with setup OT Short Term Goal 3 - Progress (Week 1): Progressing toward goal OT Short Term Goal 4 (Week 1): Pt will obtain own clothing in prep for ADL tasks with steady A (in standing). OT Short Term Goal 4 - Progress (Week 1):  Met OT Short Term Goal 5 (Week 1): Pt will thread LB clothing with setup  OT Short Term Goal 5 - Progress (Week 1): Met Week 2:  OT Short Term Goal 1 (Week 2): LTG=STG   Therapy Documentation Precautions:  Precautions Precautions: Fall, Other (comment) Precaution Comments: O2 via [place]; NG tube Restrictions Weight Bearing Restrictions: No Other Position/Activity Restrictions: HOB elevated due to continuous tube feeds. General:   Vital Signs: Therapy Vitals Temp: 98.7 F (37.1 C) Temp Source: Oral Pulse Rate: 68 Resp: 18 BP: 106/67 mmHg Patient Position (if appropriate): Lying Oxygen Therapy SpO2: 94 % O2 Device: CPAP ADL: ADL ADL Comments: see FIM  See FIM for current functional status    Zylan, Victoria Euceda C 12/26/2014, 8:53 AM

## 2014-12-26 NOTE — Progress Notes (Signed)
SLP Cancellation Note  Patient Details Name: Craig [name] MRN: [id number] DOB: May 05, 1941   Cancelled treatment:    SLP attempted to see pt this AM for skilled ST; however, pt was awaiting transport for PEG placement.  As a result, pt missed 30 minutes of skilled ST.  Will attempt to see pt for second scheduled therapy session this PM.                                                                                              Renie Stelmach, Selinda Orion 12/26/2014, 4:42 PM

## 2014-12-26 NOTE — Procedures (Signed)
20F gastrostomy tube placement under fluoro No complication No blood loss. See complete dictation in [facility] PACS.  

## 2014-12-26 NOTE — Progress Notes (Signed)
Orthopedic Tech Progress Note Patient Details:  Draycen Leichter [birth year] [id number]  Ortho Devices Type of Ortho Device: Abdominal binder Ortho Device/Splint Location: abdomen Ortho Device/Splint Interventions: Loanne Drilling, Alyson Ki 12/26/2014, 12:13 PM

## 2014-12-26 NOTE — Progress Notes (Signed)
Hypoglycemic Event  CBG: 67  Treatment: nothing per Marlowe [place], PA. Pt NPO for peg tube placement today.  Symptoms: None   Follow-up CBG: Time: N/A CBG Result: N/A   Possible Reasons for Event: Other: pt NPO   Comments/MD notified:Dan White, PA notified. Ordered to not do anything since he is NPO for PEG tube placement today. Pt is asymptomatic. Will continue to monitor.     [place] Hun A  Remember to initiate Hypoglycemia Order Set & complete

## 2014-12-26 NOTE — Progress Notes (Signed)
Speech Language Pathology Daily Session Note  Patient Details  Name: Craig [name] MRN: [id number] Date of Birth: May 08, 1941  Today's Date: 12/26/2014 SLP Individual Time: 2023-3435 SLP Individual Time Calculation (min): 30 min  Short Term Goals: Week 2: SLP Short Term Goal 1 (Week 2): Patient will keep his oral cavity closed to increase mandibular strength and overall breath support for 60 seconds at a time in 50% of opportunities with Min A multimodal cues.  SLP Short Term Goal 2 (Week 2): Patient will utilize diaphragmatic breathing to increase vocal intensity and speech intelligibility at the phrase level  to 90% accuracy with Supervision multimodal cues.  SLP Short Term Goal 3 (Week 2): Patient will consume trials of ice chips with minimal overt s/s of aspiration with 75% of trials with Min A multimodal cues.   Skilled Therapeutic Interventions:  Pt was seen for family education.  Pt's wife was present for the duration of today's therapy session; therefore, SLP initiated skilled education related to results of yesterday's MBS, including pt's risk of aspiration, and rationale behind continued NPO. SLP also provided skilled education about trials of ice chips following oral care to continue working towards initiation of PO diet.   Will continue to reinforce education with hands on training to facilitate carryover in the home environment at discharge.  Pt's wife in agreement with recommendations.  Continue per current plan of care.    Pain Pain Assessment Pain Assessment: Faces Pain Score: 10-Worst pain ever Faces Pain Scale: Hurts even more Pain Type: Surgical pain Pain Location: Abdomen Pain Orientation: Mid Pain Descriptors / Indicators: Aching Pain Intervention(s): Repositioned  Therapy/Group: Individual Therapy  Terence Googe, Selinda Orion 12/26/2014, 4:38 PM

## 2014-12-26 NOTE — Progress Notes (Signed)
Physical Therapy Weekly Progress Note  Patient Details  Name: Craig [name] MRN: [id number] Date of Birth: [birth year]  Beginning of progress report period: December 18, 2014 End of progress report period: December 26, 2014   Patient has met 3 of 3 short term goals. Pt is making good progress on CIR currently at S to steady A level for overall mobility using SPC. Continuing to work on weaning from O2 but fluctuates from being ok on room air to needing 2L O2 via [place]. The last day or so, pt has been feeling a bit weaker and missed time today due to PEG tube placement. Still planning for d/c 2/16 unless major status change. Goals at mod I for transfers and S for gait and stairs.   Patient continues to demonstrate the following deficits: decreased endurance, decreased strength, decreased functional mobility, gait impairments, decreased balance, and therefore will continue to benefit from skilled PT intervention to enhance overall performance with activity tolerance, balance, ability to compensate for deficits and coordination.  Patient progressing toward long term goals..  Continue plan of care.  PT Short Term Goals Week 1:  PT Short Term Goal 1 (Week 1): Pt will be able to demonstrate bed mobility with S PT Short Term Goal 1 - Progress (Week 1): Met PT Short Term Goal 2 (Week 1): Pt will be able to transfer with S for basic surfaces PT Short Term Goal 2 - Progress (Week 1): Met PT Short Term Goal 3 (Week 1): Pt will be able to go up/down 3 steps with 1 rail (R) with min A PT Short Term Goal 3 - Progress (Week 1): Met Week 2:  PT Short Term Goal 1 (Week 2): = LTGs  Skilled Therapeutic Interventions/Progress Updates:  Ambulation/gait training;Balance/vestibular training;Community reintegration;Discharge planning;Disease management/prevention;DME/adaptive equipment instruction;Functional mobility training;Neuromuscular re-education;Pain management;Patient/family education;Psychosocial support;Skin  care/wound management;Splinting/orthotics;Stair training;Therapeutic Activities;Therapeutic Exercise;UE/LE Strength taining/ROM;UE/LE Coordination activities;Wheelchair propulsion/positioning   Therapy Documentation Precautions:  Precautions Precautions: Fall, Other (comment) Precaution Comments: O2 via [place]; NG tube Restrictions Weight Bearing Restrictions: No Other Position/Activity Restrictions: HOB elevated due to continuous tube feeds.   See FIM for current functional status   [name] [facility] 12/26/2014, 11:57 AM

## 2014-12-26 NOTE — Progress Notes (Signed)
Occupational Therapy Session Note  Patient Details  Name: Craig [name] MRN: [id number] Date of Birth: May 22, 1941  Today's Date: 12/26/2014 OT Individual Time: 0900-1000 OT Individual Time Calculation (min): 60 min    Short Term Goals: Week 2:  OT Short Term Goal 1 (Week 2): LTG=STG  Skilled Therapeutic Interventions/Progress Updates:    Pt seen for functional transfer training with family this morning. Bathroom set-up discussed with pt's wife and DME equipment needed at d/c. Wife and pt in agreement that tub transfer bench will be best for home use.    Pt in bed upon arrival. Pt assisted to ADL apartment in w/c for practice with simulated shower transfer. Pt able to ambulate into ADL apartment bathroom with supervision for management of O2 tank. Pt completed standing> tub transfer bench with supervision and VCs for technique. Pt demonstrated competency with use of bench, and wife reported feeling confident that tub transfer bench will work best for home use.    Pt then completed grooming task seated at the sink. Pt able to wash hands and face with set-up. He applied shaving cream to face and desired therapist to complete shaving task for him. Teethbrushing with suction brush initiated by pt, the requested therapist to finish for thorughness. Pt able to transfer to recliner with supervision.    UE thera-band exercises reviewed with pt. Pt demonstrated understanding of exercises. Education provided regarding increasing number of repetitions with time, proper technique, and proper breathing technique during exercises.     Transport team arrived at end of session to take pt to off floor appointment, and requested pt return to supine. He transferred sitting EOB>supine with HOB slightly raised with supervision. Pt able to complete posterior scoot mod I with use of bed rails and HOB in trendelenburg, demonstrating significantly increased functional UE strength compared to initial OT eval. Pt left supine at  end of session with RN, transport team, and wife present.    Therapy Documentation Precautions:  Precautions Precautions: Fall, Other (comment) Precaution Comments: O2 via Byars; NG tube Restrictions Weight Bearing Restrictions: No Other Position/Activity Restrictions: HOB elevated due to continuous tube feeds. General:   Vital Signs: Therapy Vitals Pulse Rate: 68 Resp: (!) 28 BP: 136/83 mmHg Patient Position (if appropriate): Lying Oxygen Therapy SpO2: 97 % O2 Device: Nasal Cannula O2 Flow Rate (L/min): 2 L/min Pulse Oximetry Type: Continuous Pain: Pt denied pain ADL: ADL ADL Comments: see FIM Exercises:  UE there-ex band exercises performed by pt to demonstrate knowledge of proper mechanics with UE exercises.  Other Treatments:    See FIM for current functional status  Therapy/Group: Individual Therapy  Orie, Kailey Esquilin C 12/26/2014, 2:12 PM

## 2014-12-26 NOTE — Progress Notes (Signed)
Physical Therapy Note  Patient Details  Name: Craig [name] MRN: [id number] Date of Birth: 1941-08-11 Today's Date: 12/26/2014   Pt off unit for PEG placement at time of scheduled tx, missed 28min skilled PT tx. Will reattempt this afternoon if pt able. Will complete weekly progress note ASAP.   Kennieth Rad, PT, DPT  Dwaine Deter, [name] 12/26/2014, 11:15 AM

## 2014-12-26 NOTE — Progress Notes (Signed)
Patient not feeling the best this morning. States he is weak. -50 NIF and .75 L VC, with good patient effort.

## 2014-12-26 NOTE — Progress Notes (Signed)
Notified Dr. Alain Marion of pts BS of 187 while NPO. Based off of BS pt is supposed to get 4 Units of Novolog. He ordered me to give 2 units instead. Will continue to monitor. Kennieth Francois, RN

## 2014-12-27 ENCOUNTER — Observation Stay (HOSPITAL_COMMUNITY)
Admission: AD | Admit: 2014-12-27 | Discharge: 2014-12-29 | Disposition: A | Discharge status: Home / Self Care | Payer: PPO | Source: Other Acute Inpatient Hospital | Attending: Internal Medicine | Admitting: Internal Medicine

## 2014-12-27 ENCOUNTER — Observation Stay (HOSPITAL_COMMUNITY): Payer: PPO

## 2014-12-27 ENCOUNTER — Encounter (HOSPITAL_COMMUNITY): Payer: Self-pay | Admitting: Internal Medicine

## 2014-12-27 ENCOUNTER — Inpatient Hospital Stay (HOSPITAL_COMMUNITY): Payer: PPO | Admitting: Physical Therapy

## 2014-12-27 ENCOUNTER — Inpatient Hospital Stay (HOSPITAL_COMMUNITY): Payer: PPO | Admitting: *Deleted

## 2014-12-27 DIAGNOSIS — I482 Chronic atrial fibrillation, unspecified: Secondary | ICD-10-CM | POA: Diagnosis present

## 2014-12-27 DIAGNOSIS — Z7952 Long term (current) use of systemic steroids: Secondary | ICD-10-CM | POA: Insufficient documentation

## 2014-12-27 DIAGNOSIS — E119 Type 2 diabetes mellitus without complications: Secondary | ICD-10-CM | POA: Insufficient documentation

## 2014-12-27 DIAGNOSIS — R05 Cough: Secondary | ICD-10-CM | POA: Diagnosis not present

## 2014-12-27 DIAGNOSIS — Z79899 Other long term (current) drug therapy: Secondary | ICD-10-CM | POA: Insufficient documentation

## 2014-12-27 DIAGNOSIS — Z8551 Personal history of malignant neoplasm of bladder: Secondary | ICD-10-CM | POA: Insufficient documentation

## 2014-12-27 DIAGNOSIS — R059 Cough, unspecified: Secondary | ICD-10-CM

## 2014-12-27 DIAGNOSIS — R001 Bradycardia, unspecified: Secondary | ICD-10-CM | POA: Insufficient documentation

## 2014-12-27 DIAGNOSIS — I509 Heart failure, unspecified: Secondary | ICD-10-CM | POA: Diagnosis not present

## 2014-12-27 DIAGNOSIS — Z7901 Long term (current) use of anticoagulants: Secondary | ICD-10-CM | POA: Insufficient documentation

## 2014-12-27 DIAGNOSIS — I10 Essential (primary) hypertension: Secondary | ICD-10-CM | POA: Diagnosis not present

## 2014-12-27 DIAGNOSIS — R61 Generalized hyperhidrosis: Secondary | ICD-10-CM | POA: Insufficient documentation

## 2014-12-27 DIAGNOSIS — K219 Gastro-esophageal reflux disease without esophagitis: Secondary | ICD-10-CM | POA: Insufficient documentation

## 2014-12-27 DIAGNOSIS — Z794 Long term (current) use of insulin: Secondary | ICD-10-CM | POA: Insufficient documentation

## 2014-12-27 DIAGNOSIS — R079 Chest pain, unspecified: Secondary | ICD-10-CM | POA: Diagnosis not present

## 2014-12-27 DIAGNOSIS — E43 Unspecified severe protein-calorie malnutrition: Secondary | ICD-10-CM | POA: Insufficient documentation

## 2014-12-27 DIAGNOSIS — G7001 Myasthenia gravis with (acute) exacerbation: Secondary | ICD-10-CM | POA: Diagnosis present

## 2014-12-27 DIAGNOSIS — R1310 Dysphagia, unspecified: Secondary | ICD-10-CM | POA: Insufficient documentation

## 2014-12-27 LAB — COMPREHENSIVE METABOLIC PANEL
ALBUMIN: 3 g/dL — AB (ref 3.5–5.2)
ALK PHOS: 46 U/L (ref 39–117)
ALT: 54 U/L — ABNORMAL HIGH (ref 0–53)
ANION GAP: 4 — AB (ref 5–15)
AST: 35 U/L (ref 0–37)
BUN: 7 mg/dL (ref 6–23)
CALCIUM: 8.5 mg/dL (ref 8.4–10.5)
CHLORIDE: 103 mmol/L (ref 96–112)
CO2: 32 mmol/L (ref 19–32)
Creatinine, Ser: 0.67 mg/dL (ref 0.50–1.35)
Glucose, Bld: 170 mg/dL — ABNORMAL HIGH (ref 70–99)
Potassium: 4.1 mmol/L (ref 3.5–5.1)
SODIUM: 139 mmol/L (ref 135–145)
Total Bilirubin: 0.9 mg/dL (ref 0.3–1.2)
Total Protein: 5.2 g/dL — ABNORMAL LOW (ref 6.0–8.3)

## 2014-12-27 LAB — BASIC METABOLIC PANEL
Anion gap: 8 (ref 5–15)
BUN: 9 mg/dL (ref 6–23)
CALCIUM: 8.3 mg/dL — AB (ref 8.4–10.5)
CO2: 31 mmol/L (ref 19–32)
CREATININE: 0.6 mg/dL (ref 0.50–1.35)
Chloride: 99 mmol/L (ref 96–112)
GFR calc Af Amer: >90 mL/min (ref >90)
Glucose, Bld: 123 mg/dL — ABNORMAL HIGH (ref 70–99)
POTASSIUM: 3.6 mmol/L (ref 3.5–5.1)
Sodium: 138 mmol/L (ref 135–145)

## 2014-12-27 LAB — CBC WITH DIFFERENTIAL/PLATELET
Basophils Absolute: 0 10*3/uL (ref 0.0–0.1)
Basophils Relative: 0 % (ref 0–1)
Eosinophils Absolute: 0 10*3/uL (ref 0.0–0.7)
Eosinophils Relative: 0 % (ref 0–5)
HCT: 39.3 % (ref 39.0–52.0)
HEMOGLOBIN: 13.1 g/dL (ref 13.0–17.0)
LYMPHS ABS: 0.4 10*3/uL — AB (ref 0.7–4.0)
LYMPHS PCT: 4 % — AB (ref 12–46)
MCH: 31.6 pg (ref 26.0–34.0)
MCHC: 33.3 g/dL (ref 30.0–36.0)
MCV: 94.7 fL (ref 78.0–100.0)
MONO ABS: 0.5 10*3/uL (ref 0.1–1.0)
MONOS PCT: 5 % (ref 3–12)
Neutro Abs: 10.1 10*3/uL — ABNORMAL HIGH (ref 1.7–7.7)
Neutrophils Relative %: 91 % — ABNORMAL HIGH (ref 43–77)
Platelets: 202 10*3/uL (ref 150–400)
RBC: 4.15 MIL/uL — ABNORMAL LOW (ref 4.22–5.81)
RDW: 14.6 % (ref 11.5–15.5)
WBC: 11.1 10*3/uL — ABNORMAL HIGH (ref 4.0–10.5)

## 2014-12-27 LAB — CK TOTAL AND CKMB (NOT AT ARMC)
CK TOTAL: 41 U/L (ref 7–232)
CK, MB: 1.6 ng/mL (ref 0.3–4.0)
Relative Index: INVALID (ref 0.0–2.5)

## 2014-12-27 LAB — TROPONIN I
Troponin I: <0.03 ng/mL (ref <0.031)
Troponin I: <0.03 ng/mL (ref <0.031)

## 2014-12-27 LAB — CBC
HCT: 39.8 % (ref 39.0–52.0)
Hemoglobin: 13.3 g/dL (ref 13.0–17.0)
MCH: 31.1 pg (ref 26.0–34.0)
MCHC: 33.4 g/dL (ref 30.0–36.0)
MCV: 93.2 fL (ref 78.0–100.0)
PLATELETS: 213 10*3/uL (ref 150–400)
RBC: 4.27 MIL/uL (ref 4.22–5.81)
RDW: 14.6 % (ref 11.5–15.5)
WBC: 11.3 10*3/uL — ABNORMAL HIGH (ref 4.0–10.5)

## 2014-12-27 LAB — GLUCOSE, CAPILLARY
GLUCOSE-CAPILLARY: 152 mg/dL — AB (ref 70–99)
GLUCOSE-CAPILLARY: 120 mg/dL — AB (ref 70–99)
GLUCOSE-CAPILLARY: 196 mg/dL — AB (ref 70–99)
Glucose-Capillary: 112 mg/dL — ABNORMAL HIGH (ref 70–99)
Glucose-Capillary: 192 mg/dL — ABNORMAL HIGH (ref 70–99)

## 2014-12-27 LAB — MRSA PCR SCREENING: MRSA by PCR: NEGATIVE

## 2014-12-27 LAB — TSH: TSH: 0.393 u[IU]/mL (ref 0.350–4.500)

## 2014-12-27 MED ORDER — SODIUM CHLORIDE 0.9 % IJ SOLN
3.0000 mL | Freq: Two times a day (BID) | INTRAMUSCULAR | Status: DC
  Administered 2014-12-29: 3 mL via INTRAVENOUS

## 2014-12-27 MED ORDER — IPRATROPIUM-ALBUTEROL 0.5-2.5 (3) MG/3ML IN SOLN: 3.0000 mL | Freq: Four times a day (QID) | RESPIRATORY_TRACT | Status: DC | PRN

## 2014-12-27 MED ORDER — ACETAMINOPHEN 650 MG RE SUPP
650.0000 mg | Freq: Four times a day (QID) | RECTAL | Status: DC | PRN
  Filled 2014-12-27: qty 1

## 2014-12-27 MED ORDER — PREDNISONE 20 MG PO TABS
60.0000 mg | ORAL_TABLET | Freq: Every day | ORAL | Status: DC
  Administered 2014-12-27 – 2014-12-29 (×3): 60 mg
  Filled 2014-12-27 (×3): qty 3

## 2014-12-27 MED ORDER — JEVITY 1.2 CAL PO LIQD
1000.0000 mL | ORAL | Status: DC
  Administered 2014-12-27 – 2014-12-29 (×4): 1000 mL
  Filled 2014-12-27 (×2): qty 1000

## 2014-12-27 MED ORDER — MORPHINE SULFATE 2 MG/ML IJ SOLN
1.0000 mg | INTRAMUSCULAR | Status: DC | PRN
  Administered 2014-12-27 (×2): 2 mg via INTRAVENOUS
  Administered 2014-12-27: 1 mg via INTRAVENOUS
  Administered 2014-12-28 – 2014-12-29 (×8): 2 mg via INTRAVENOUS
  Filled 2014-12-27 (×12): qty 1

## 2014-12-27 MED ORDER — ONDANSETRON HCL 4 MG/2ML IJ SOLN: 4.0000 mg | Freq: Four times a day (QID) | INTRAMUSCULAR | Status: DC | PRN

## 2014-12-27 MED ORDER — AMIODARONE HCL 100 MG PO TABS
100.0000 mg | ORAL_TABLET | Freq: Every day | ORAL | Status: DC
  Administered 2014-12-27 – 2014-12-29 (×3): 100 mg
  Filled 2014-12-27 (×3): qty 1

## 2014-12-27 MED ORDER — RIVAROXABAN 20 MG PO TABS
20.0000 mg | ORAL_TABLET | Freq: Every day | ORAL | Status: DC
  Administered 2014-12-27 – 2014-12-29 (×3): 20 mg via ORAL
  Filled 2014-12-27 (×3): qty 1

## 2014-12-27 MED ORDER — ACETAMINOPHEN 325 MG PO TABS
650.0000 mg | ORAL_TABLET | Freq: Four times a day (QID) | ORAL | Status: DC | PRN
  Administered 2014-12-28 – 2014-12-29 (×4): 650 mg via ORAL
  Filled 2014-12-27 (×4): qty 2

## 2014-12-27 MED ORDER — ARTIFICIAL TEARS OP OINT: TOPICAL_OINTMENT | OPHTHALMIC | Status: DC | PRN

## 2014-12-27 MED ORDER — FREE WATER
200.0000 mL | Status: DC
  Administered 2014-12-27 – 2014-12-29 (×13): 200 mL

## 2014-12-27 MED ORDER — CHLORHEXIDINE GLUCONATE 0.12 % MT SOLN
15.0000 mL | Freq: Two times a day (BID) | OROMUCOSAL | Status: DC
  Administered 2014-12-27 – 2014-12-29 (×3): 15 mL via OROMUCOSAL
  Filled 2014-12-27 (×2): qty 15

## 2014-12-27 MED ORDER — VITAL AF 1.2 CAL PO LIQD
1000.0000 mL | ORAL | Status: DC
  Filled 2014-12-27 (×3): qty 1000

## 2014-12-27 MED ORDER — SODIUM CHLORIDE 0.9 % IJ SOLN
10.0000 mL | Freq: Two times a day (BID) | INTRAMUSCULAR | Status: DC
  Administered 2014-12-27 – 2014-12-29 (×5): 10 mL

## 2014-12-27 MED ORDER — ASPIRIN EC 325 MG PO TBEC: 325.0000 mg | DELAYED_RELEASE_TABLET | Freq: Every day | ORAL | Status: DC

## 2014-12-27 MED ORDER — MORPHINE SULFATE 2 MG/ML IJ SOLN
2.0000 mg | Freq: Once | INTRAMUSCULAR | Status: AC
  Administered 2014-12-27: 2 mg via INTRAVENOUS

## 2014-12-27 MED ORDER — ONDANSETRON HCL 4 MG PO TABS: 4.0000 mg | ORAL_TABLET | Freq: Four times a day (QID) | ORAL | Status: DC | PRN

## 2014-12-27 MED ORDER — JEVITY 1.2 CAL PO LIQD
1000.0000 mL | ORAL | Status: DC
  Filled 2014-12-27 (×3): qty 1000

## 2014-12-27 MED ORDER — SODIUM CHLORIDE 0.9 % IJ SOLN
10.0000 mL | INTRAMUSCULAR | Status: DC | PRN
  Administered 2014-12-28: 10 mL
  Filled 2014-12-27: qty 40

## 2014-12-27 MED ORDER — IOHEXOL 350 MG/ML SOLN
100.0000 mL | Freq: Once | INTRAVENOUS | Status: AC | PRN
  Administered 2014-12-27: 100 mL via INTRAVENOUS

## 2014-12-27 MED ORDER — METOPROLOL TARTRATE 12.5 MG HALF TABLET
12.5000 mg | ORAL_TABLET | Freq: Every day | ORAL | Status: DC
  Administered 2014-12-27 – 2014-12-29 (×3): 12.5 mg
  Filled 2014-12-27 (×3): qty 1

## 2014-12-27 MED ORDER — ATORVASTATIN CALCIUM 40 MG PO TABS
40.0000 mg | ORAL_TABLET | Freq: Every day | ORAL | Status: DC
  Administered 2014-12-27 – 2014-12-29 (×3): 40 mg
  Filled 2014-12-27 (×3): qty 1

## 2014-12-27 MED ORDER — IPRATROPIUM BROMIDE 0.02 % IN SOLN
0.5000 mg | Freq: Four times a day (QID) | RESPIRATORY_TRACT | Status: DC | PRN
  Administered 2014-12-28: 0.5 mg via RESPIRATORY_TRACT
  Filled 2014-12-27: qty 2.5

## 2014-12-27 MED ORDER — ASPIRIN 325 MG PO TABS
325.0000 mg | ORAL_TABLET | Freq: Every day | ORAL | Status: DC
  Administered 2014-12-27 – 2014-12-29 (×3): 325 mg via ORAL
  Filled 2014-12-27 (×3): qty 1

## 2014-12-27 MED ORDER — PYRIDOSTIGMINE BROMIDE 60 MG PO TABS
90.0000 mg | ORAL_TABLET | Freq: Four times a day (QID) | ORAL | Status: DC
  Administered 2014-12-27 – 2014-12-29 (×10): 90 mg via NASOGASTRIC
  Filled 2014-12-27 (×12): qty 1.5

## 2014-12-27 NOTE — Progress Notes (Signed)
NIF -60, VC .75L, best of three attempts, good patient effort.

## 2014-12-27 NOTE — Progress Notes (Signed)
Called wife about the status of pt. She thanked me for calling, and said that she would ask downstairs what room he was in when she came. She remained calm. Kennieth Francois, RN

## 2014-12-27 NOTE — Progress Notes (Signed)
UR completed 

## 2014-12-27 NOTE — Progress Notes (Signed)
Around 2230 on 12/26/24 pt called out to ask for pain medication. Went to the room to assess him and found him diaphoretic, and holding his chest. I asked him where he was having pain, and he said his chest and left shoulder and rated it a 9/10. When asked to describe the pain he said that it felt like his left arm was going to fall off. Obtained vitals, called rapid response, checked blood sugar, and called Dr. Alain Marion. New orders received to give nitro, CKMB, myoglobin, troponins, give dilaudid 1mg , and obtain EKG. EKG performed by Rapid response nurse. Pts BP remained stable, and heart rate ranged from 45-80. O2 was 94% or higher on 2L O2 at all times. One dose of nitro was given, and 1mg  of dilaudid was given and he then rated his pain at a 4 in the left shoulder only. Pt complained of chest pain for 10-15 min then the pain was only in the left shoulder. Dr. Alain Marion came to assess patient and decided it was best to transfer him to a unit where he can be cardiac monitored. Then Dr. Hal Hope came to assess patient. Called report to 3W nurse at 0100. Packed up all of the patients belongings, and sent them with pt. Pt was then discharged to 3W, escorted by myself and rapid response nurse, April.

## 2014-12-27 NOTE — Progress Notes (Signed)
RT note-FVC- .740ml and NIF -80, patient was a little sleepy due to pain medicine, but had good effort.

## 2014-12-27 NOTE — Progress Notes (Signed)
Patient seen and examined. Admitted secondary to chest pain from inpatient rehab unit. Currently w/o CP; complaining of epigastric and left shoulder pain. Troponin neg so far. CTA neg for pneumothorax and PE; positive for small amount of free anterior to stomach/intraperitoneal; most likely due to PEG tube placement. Pain most likely associated with it.  Plan: -will finish cycling troponin -continue supportive care -per IR ok to use tube and follow symptoms; free air expected to resolved on its own  Craig [name] [phone]

## 2014-12-27 NOTE — Progress Notes (Signed)
Subjective: Pt with c/o left shoulder/chest pain earlier today; has improved; CXR/CT angio chest with no ptx, PE but small amt free intraperitoneal air ant to stomach; troponin neg  Objective: Vital signs in last 24 hours: Temp:  [97.1 F (36.2 C)-97.9 F (36.6 C)] 97.1 F (36.2 C) (02/13 0542) Pulse Rate:  [68-82] 78 (02/13 1215) Resp:  [20] 20 (02/13 0159) BP: (102-137)/(65-93) 114/73 mmHg (02/13 1215) SpO2:  [94 %-99 %] 98 % (02/13 1025) Weight:  [232 lb 7.8 oz (105.455 kg)] 232 lb 7.8 oz (105.455 kg) (02/13 0159) Last BM Date: 12/26/14 (per pt, last documented is 12/25/14)  Intake/Output from previous day:   Intake/Output this shift: Total I/O In: -  Out: 650 [Urine:650]  G tube intact, insertion site ok, mildly tender, dressing clean and dry, abd soft,+BS Lab Results:   Recent Labs  12/27/14 0300 12/27/14 0800  WBC 11.1* 11.3*  HGB 13.1 13.3  HCT 39.3 39.8  PLT 202 213   BMET  Recent Labs  12/27/14 0300 12/27/14 0800  NA 139 138  K 4.1 3.6  CL 103 99  CO2 32 31  GLUCOSE 170* 123*  BUN 7 9  CREATININE 0.67 0.60  CALCIUM 8.5 8.3*   PT/INR  Recent Labs  12/26/14 0525  LABPROT 14.1  INR 1.08   ABG No results for input(s): PHART, HCO3 in the last 72 hours.  Invalid input(s): PCO2, PO2  Studies/Results: Ct Abdomen Wo Contrast  12/25/2014   CLINICAL DATA:  Initial encounter for evaluate anatomy secondary to G-tube placement. 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 Dysphagia.  EXAM: CT ABDOMEN WITHOUT CONTRAST  TECHNIQUE: Multidetector CT imaging of the abdomen was performed following the standard protocol without IV contrast.  COMPARISON:  12/02/2014  FINDINGS: Lower chest: Dependent left base atelectasis. Trace left pleural fluid or thickening, new. Mild cardiomegaly with coronary artery atherosclerosis.  Hepatobiliary: Old granulomatous disease in the liver. Contracted gallbladder, without biliary ductal dilatation.  Pancreas:  Fatty replacement involving the pancreatic head and uncinate process. No duct dilatation or acute pancreatitis.  Spleen: Normal  Adrenals/Urinary Tract: Normal adrenal glands. No renal calculi or hydronephrosis. No abdominal hydroureter or ureteric stone.  Stomach/Bowel: Nasogastric tube terminates at the body of the stomach. The gastric body is positioned anteriorly, without interposition of colon or small bowel. There is no omental nodularity in the region of the greater curvature of the stomach. No abnormal gastric wall thickening.  Extensive colonic diverticulosis. Contrast identified throughout the colon. Small bowel loops and appendix are not well opacified. Small bowel is normal in caliber.  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 1.9 cm on image 34 versus similar (when remeasured). Aortic and branch vessel atherosclerosis. No retroperitoneal or retrocrural adenopathy.  Other: No ascites. Omental nodularity is again identified. An 11 mm nodule on image 54 likely represents enlargement of a 3 mm nodule on image 51 of the prior exam. Right-sided abdominal omental nodule measures 1.5 cm on image 50, enlarged from 9 mm on the prior exam (when remeasured).  Musculoskeletal: Degenerative disc disease, most advanced at L4-5 and L1-2. Convex right lumbar spine curvature.  IMPRESSION: 1. Progressive omental/peritoneal metastasis. 2. No disease identified along the anterior aspect of the stomach to preclude gastrostomy tube placement. 3. Celiac origin aneurysm, similar. 4. Left base atelectasis with adjacent trace left pleural fluid or thickening, new.   Electronically Signed   By: Abigail Miyamoto M.D.   On: 12/25/2014 16:34   Ct Angio  Chest Pe W/cm &/or Wo Cm  12/27/2014   CLINICAL DATA:  Patient complaining of chest pain and left shoulder pain. Diaphoretic.  EXAM: CT ANGIOGRAPHY CHEST WITH CONTRAST  TECHNIQUE: Multidetector CT imaging of the chest was performed using the  standard protocol during bolus administration of intravenous contrast. Multiplanar CT image reconstructions and MIPs were obtained to evaluate the vascular anatomy.  CONTRAST:  154mL OMNIPAQUE IOHEXOL 350 MG/ML SOLN  COMPARISON:  Chest radiograph 12/27/2014; abdomen pelvic CT 12/25/2014  FINDINGS: Adequate opacification of the pulmonary arterial system. Evaluation of the subsegmental pulmonary arteries limited due to motion artifact. Within the above limitation, no definite evidence for pulmonary embolism.  Left anterior chest wall Port-A-Cath is present with tip terminating in the superior vena cava. Right-sided central venous catheter is present with tip terminating at the superior cavoatrial junction. Heart is enlarged. No pericardial effusion. Coronary arterial vascular calcifications. No axillary, mediastinal or hilar lymphadenopathy. Small hiatal hernia.  The central airways are patent. There is dependent atelectasis within the bilateral lower lobes. No definite pleural effusion or pneumothorax.  Visualization of the upper abdomen demonstrates interval placement of gastrostomy tube. Tube appears in appropriate position. There is a small amount of free intraperitoneal air adjacent to the anterior aspect of the stomach, likely postprocedural in etiology. Small gallstone within the gallbladder lumen. Unchanged bilateral adrenal thickening. Re- demonstrated omental nodularity. 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  Degenerative changes of the thoracic spine without aggressive or acute appearing osseous lesions.  Review of the MIP images confirms the above findings.  IMPRESSION: No evidence for pulmonary embolism. Limited evaluation of the subsegmental pulmonary arteries due to motion artifact.  Interval placement of gastrostomy tube. Small amount of free intraperitoneal air adjacent to the anterior margin the stomach, likely postprocedural in etiology.  Re- demonstrated omental nodularity, compatible  with metastatic disease.  Celiac origin aneurysm, stable.  Bilateral lower lobe atelectatic changes.  Critical Value/emergent results were called by telephone at the time of interpretation on 12/27/2014 at 8:00 am to Nurse [facility] , who verbally acknowledged these results and will page Dr. Dyann Kief.   Electronically Signed   By: Lovey Newcomer M.D.   On: 12/27/2014 08:03   Ir Gastrostomy Tube Mod Sed  12/26/2014   CLINICAL DATA:  Myasthenia gravis, dysphasia, needs enteral feeding support.  EXAM: PERC PLACEMENT GASTROSTOMY  FLUOROSCOPY TIME:  1 minutes 48 seconds  TECHNIQUE: The procedure, risks, benefits, and alternatives were explained to the patient. Questions regarding the procedure were encouraged and answered. The patient understands and consents to the procedure. As antibiotic prophylaxis, cefazolin was ordered pre-procedure and administered intravenously within one hour of incision.Progression of previously administered oral barium into the colon was confirmed fluoroscopically. A 5 French angiographic catheter was placed as orogastric tube. The upper abdomen was prepped with Betadine, draped in usual sterile fashion, and infiltrated locally with 1% lidocaine.  Intravenous Fentanyl and Versed were administered as conscious sedation during continuous cardiorespiratory monitoring by the radiology RN, with a total moderate sedation time of ten minutes.  Stomach was insufflated using air through the orogastric tube. An 55 French sheath needle was advanced percutaneously into the gastric lumen under fluoroscopy. Gas could be aspirated and a small contrast injection confirmed intraluminal spread. The sheath was exchanged over a guidewire for a 9 [place] vascular sheath, through which the snare device was advanced and used to snare a guidewire passed through the orogastric tube. This was withdrawn, and the snare attached to the 20 [place] pull-through  gastrostomy tube, which was advanced antegrade, positioned with  the internal bumper securing the anterior gastric wall to the anterior abdominal wall. Small contrast injection confirms appropriate positioning. The external bumper was applied and the catheter was flushed.  COMPLICATIONS: COMPLICATIONS none  IMPRESSION: 1. Technically successful 20 French pull-through gastrostomy placement under fluoroscopy.   Electronically Signed   By: Lucrezia Europe M.D.   On: 12/26/2014 11:26   Dg Chest Port 1 View  12/27/2014   CLINICAL DATA:  Acute onset of chest pain and shortness of breath. Initial encounter.  EXAM: PORTABLE CHEST - 1 VIEW  COMPARISON:  Chest radiograph performed 12/11/2014  FINDINGS: The lungs are hypoexpanded. Minimal left basilar opacity likely reflects atelectasis. There is no evidence of pleural effusion or pneumothorax.  The cardiomediastinal silhouette is borderline enlarged. A right IJ dual lumen catheter seen ending about the mid SVC. A left-sided chest port is noted ending about the distal SVC. No acute osseous abnormalities are seen.  IMPRESSION: Lungs hypoexpanded. Minimal left basilar opacity likely reflects atelectasis; lungs otherwise grossly clear.   Electronically Signed   By: Garald Balding M.D.   On: 12/27/2014 02:55    Anti-infectives: Anti-infectives    None      Assessment/Plan: S/p perc G tube 2/12; small amt free intraperit air ant to stomach noted on CT but not uncommon post tube placement; ok to use tube; outer disk should remain taut to skin surface to prevent leakage of gastric contents   ALLRED,D [facility] 12/27/2014

## 2014-12-27 NOTE — H&P (Signed)
[facility] History and Physical  [name] WJX:[id number] DOB: Mar 11, 1941 DOA: 12/27/2014  Referring physician: Dr. Charyl Bigger. PCP: Ann Held, MD  Chief Complaint: Chest pain.  HPI: Craig [name] is a 74 y.o. male who was recently admitted for myasthenia gravis crisis and had plasmapheresis was transferred to rehabilitation. Last night patient started developing chest pain which was retrosternal pressure-like lasted for 15 minutes following which he related to his left-sided shoulder. Patient also was diaphoretic but denies any associated shortness of breath. He was given Dilaudid for pain relief following which patient became transiently bradycardic with heart rate into the 40s. Patient presently is chest pain-free. Patient had a PEG tube placed yesterday for his dysphagia secondary to myasthenia gravis. Patient denies any abdominal pain nausea vomiting or diarrhea fever chills. EKG shows atrial fibrillation with nonspecific ST changes. Patient has been admitted for further management of his chest pain. Patient is on Xarelto which has been held for last 2 days for his PEG tube placement.   Review of Systems: As presented in the history of presenting illness, rest negative.  Past Medical History  Diagnosis Date  . Atrial fibrillation, chronic     on Xarelto   . HTN (hypertension)   . CHF (congestive heart failure)   . Diabetes mellitus   . 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 Abdominal wall metastasis removal     Social History:  reports that he has never smoked. He does not have any smokeless tobacco history on file. He reports that he does not drink alcohol or use illicit drugs. Where does patient live home. Presently in rehabilitation. Can patient participate in ADLs? Yes.  No Known Allergies  Family History: History reviewed. No pertinent family history.     Prior to Admission medications   Medication Sig Start Date End Date Taking? Authorizing Provider  amiodarone (PACERONE) 100 MG tablet Place 1 tablet (100 mg total) into feeding tube daily. 12/17/14   Delfina Redwood, MD  antiseptic oral rinse (CPC / CETYLPYRIDINIUM CHLORIDE 0.05%) 0.05 % LIQD solution 7 mLs by Mouth Rinse route QID. 12/17/14   Delfina Redwood, MD  artificial tears (LACRILUBE) OINT ophthalmic ointment Place into both eyes every 4 (four) hours as needed for dry eyes. 12/17/14   Delfina Redwood, MD  atorvastatin (LIPITOR) 40 MG tablet Place 1 tablet (40 mg total) into feeding tube daily. 12/17/14   Delfina Redwood, MD  chlorhexidine (PERIDEX) 0.12 % solution 15 mLs by Mouth Rinse route 2 (two) times daily. 12/17/14   Delfina Redwood, MD  insulin aspart (NOVOLOG) 100 UNIT/ML injection Inject 0-20 Units into the skin every 4 (four) hours. 12/17/14   Delfina Redwood, MD  ipratropium (ATROVENT HFA) 17 MCG/ACT inhaler Inhale 1 puff into the lungs every 6 (six) hours as needed for wheezing.    Historical Provider, MD  metoprolol tartrate (LOPRESSOR) 25 MG tablet Place 0.5 tablets (12.5 mg total) into feeding tube daily. 12/17/14   Delfina Redwood, MD  metroNIDAZOLE (FLAGYL) 500 MG tablet Take 1 tablet (500 mg total) by mouth every 8 (eight) hours. Through 12/24/14, then stop 12/17/14   Delfina Redwood, MD  Nutritional Supplements (FEEDING SUPPLEMENT, VITAL AF 1.2 CAL,) LIQD Place 1,000 mLs into feeding tube continuous. 12/17/14   Delfina Redwood, MD  predniSONE (DELTASONE) 20 MG tablet Place 3 tablets (60 mg total) into feeding tube  daily with breakfast. 12/17/14   Delfina Redwood, MD  pyridostigmine (MESTINON) 60 MG tablet 1.5 tablets (90 mg total) by Per NG tube route 4 (four) times daily. 12/17/14   Delfina Redwood, MD  rivaroxaban (XARELTO) 20 MG TABS tablet Take 20 mg by mouth daily with supper.    Historical Provider, MD  Water For Irrigation, Sterile (FREE WATER) SOLN  Place 200 mLs into feeding tube every 4 (four) hours. 12/17/14   Delfina Redwood, MD    Physical Exam: Filed Vitals:   12/27/14 0159  BP: 125/75  Pulse: 74  Temp: 97.9 F (36.6 C)  TempSrc: Oral  Resp: 20  Weight: 105.455 kg (232 lb 7.8 oz)  SpO2: 94%     General:  Well-developed and nourished.  Eyes: Anicteric no pallor.  ENT: No discharge from the ears eyes nose or mouth.  Neck: No mass felt. No JVD appreciated.  Cardiovascular: S1-S2 heard.  Respiratory: No rhonchi or crepitations.  Abdomen: PEG tube seen. Nontender bowel sounds present.  Skin: Skin rash in the lower extremities.  Musculoskeletal: No edema.  Psychiatric: Appears normal.  Neurologic: Alert awake oriented to time place and person. Moves all extremities.  Labs on Admission:  Basic Metabolic Panel:  Recent Labs Lab 12/25/14 0600  NA 141  K 3.7  CL 104  CO2 32  GLUCOSE 130*  BUN 11  CREATININE 0.71  CALCIUM 8.7   Liver Function Tests: No results for input(s): AST, ALT, ALKPHOS, BILITOT, PROT, ALBUMIN in the last 168 hours. No results for input(s): LIPASE, AMYLASE in the last 168 hours. No results for input(s): AMMONIA in the last 168 hours. CBC:  Recent Labs Lab 12/26/14 0525  WBC 10.1  HGB 12.5*  HCT 37.3*  MCV 92.8  PLT 226   Cardiac Enzymes:  Recent Labs Lab 12/26/14 2305  CKTOTAL 41  CKMB 1.6  TROPONINI <0.03    BNP (last 3 results) No results for input(s): BNP in the last 8760 hours.  ProBNP (last 3 results) No results for input(s): PROBNP in the last 8760 hours.  CBG:  Recent Labs Lab 12/26/14 1157 12/26/14 1617 12/26/14 2018 12/26/14 2236 12/27/14 0152  GLUCAP 112* 133* 187* 189* 152*    Radiological Exams on Admission: Ct Abdomen Wo Contrast  12/25/2014   CLINICAL DATA:  Initial encounter for evaluate anatomy secondary to G-tube placement. 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 Dysphagia.  EXAM: CT ABDOMEN WITHOUT CONTRAST   TECHNIQUE: Multidetector CT imaging of the abdomen was performed following the standard protocol without IV contrast.  COMPARISON:  12/02/2014  FINDINGS: Lower chest: Dependent left base atelectasis. Trace left pleural fluid or thickening, new. Mild cardiomegaly with coronary artery atherosclerosis.  Hepatobiliary: Old granulomatous disease in the liver. Contracted gallbladder, without biliary ductal dilatation.  Pancreas: Fatty replacement involving the pancreatic head and uncinate process. No duct dilatation or acute pancreatitis.  Spleen: Normal  Adrenals/Urinary Tract: Normal adrenal glands. No renal calculi or hydronephrosis. No abdominal hydroureter or ureteric stone.  Stomach/Bowel: Nasogastric tube terminates at the body of the stomach. The gastric body is positioned anteriorly, without interposition of colon or small bowel. There is no omental nodularity in the region of the greater curvature of the stomach. No abnormal gastric wall thickening.  Extensive colonic diverticulosis. Contrast identified throughout the colon. Small bowel loops and appendix are not well opacified. Small bowel is normal in caliber.  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 1.9 cm on image  34 versus similar (when remeasured). Aortic and branch vessel atherosclerosis. No retroperitoneal or retrocrural adenopathy.  Other: No ascites. Omental nodularity is again identified. An 11 mm nodule on image 54 likely represents enlargement of a 3 mm nodule on image 51 of the prior exam. Right-sided abdominal omental nodule measures 1.5 cm on image 50, enlarged from 9 mm on the prior exam (when remeasured).  Musculoskeletal: Degenerative disc disease, most advanced at L4-5 and L1-2. Convex right lumbar spine curvature.  IMPRESSION: 1. Progressive omental/peritoneal metastasis. 2. No disease identified along the anterior aspect of the stomach to preclude gastrostomy tube placement. 3. Celiac origin  aneurysm, similar. 4. Left base atelectasis with adjacent trace left pleural fluid or thickening, new.   Electronically Signed   By: Abigail Miyamoto M.D.   On: 12/25/2014 16:34   Ir Gastrostomy Tube Mod Sed  12/26/2014   CLINICAL DATA:  Myasthenia gravis, dysphasia, needs enteral feeding support.  EXAM: PERC PLACEMENT GASTROSTOMY  FLUOROSCOPY TIME:  1 minutes 48 seconds  TECHNIQUE: The procedure, risks, benefits, and alternatives were explained to the patient. Questions regarding the procedure were encouraged and answered. The patient understands and consents to the procedure. As antibiotic prophylaxis, cefazolin was ordered pre-procedure and administered intravenously within one hour of incision.Progression of previously administered oral barium into the colon was confirmed fluoroscopically. A 5 French angiographic catheter was placed as orogastric tube. The upper abdomen was prepped with Betadine, draped in usual sterile fashion, and infiltrated locally with 1% lidocaine.  Intravenous Fentanyl and Versed were administered as conscious sedation during continuous cardiorespiratory monitoring by the radiology RN, with a total moderate sedation time of ten minutes.  Stomach was insufflated using air through the orogastric tube. An 59 French sheath needle was advanced percutaneously into the gastric lumen under fluoroscopy. Gas could be aspirated and a small contrast injection confirmed intraluminal spread. The sheath was exchanged over a guidewire for a 9 [place] vascular sheath, through which the snare device was advanced and used to snare a guidewire passed through the orogastric tube. This was withdrawn, and the snare attached to the 20 French pull-through gastrostomy tube, which was advanced antegrade, positioned with the internal bumper securing the anterior gastric wall to the anterior abdominal wall. Small contrast injection confirms appropriate positioning. The external bumper was applied and the catheter was  flushed.  COMPLICATIONS: COMPLICATIONS none  IMPRESSION: 1. Technically successful 20 French pull-through gastrostomy placement under fluoroscopy.   Electronically Signed   By: Lucrezia Europe M.D.   On: 12/26/2014 11:26   Dg Swallowing Func-speech Pathology  12/25/2014    Objective Swallowing Evaluation:    Patient Details  Name: Montana Fassnacht MRN: [id number] Date of Birth: 01/06/1941  Today's Date: 12/25/2014 Time: SLP Start Time: 0930-SLP Stop Time: 1000 SLP Time Calculation (min) (ACUTE ONLY): 30 min  Past Medical History:  Past Medical History  Diagnosis Date  . Atrial fibrillation, chronic     on Xarelto   . HTN (hypertension)   . CHF (congestive heart failure)   . Diabetes mellitus   . 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 Abdominal wall metastasis removal     HPI:  HPI: [name], never smoker, transferred to [facility] on 1/22 with acute MG  exacerbation & respiratory failure. Decompensated requiring intubation  1/22-1/25.  Now NPO with NG tube due to continued overt s/s of aspiration  during PO trials .  MBS ordered today to objectively determine readiness  for initiation of PO diet.      Assessment / Plan / Recommendation CHL IP CLINICAL IMPRESSIONS 12/25/2014  Dysphagia Diagnosis Severe pharyngeal phase dysphagia;Moderate oral phase  dysphagia  Clinical impression Pt presents with a moderately severe oropharyngeal  dysphagia with both sensory and motor components.  Oral phase impairments  are characterized by generalized weakness, most pronounced in lingual  strength and range of motion.  This resulted in weak manipulation of  boluses with reduced posterior propulsion of materials to the oropharynx.   Pt also presents with decreased base of tongue retraction, decreased,  hyolaryngeal excursion, and reduced pharyngeal constriction to move  materials through the pharynx and achieve adequate  airway protection.  The  abovementioned deficits resulted in residue at the base of tongue,  posterior pharyngeal wall, vallecula and the pyriforms, as well as delayed  swallow initiation with swallow response triggered most consistently at  the pyriforms.  Delayed swallow initiation resulted in aspiration of honey  thick, nectar thick, and thin liquids which pt was unable to effectively  clear via reflexive or volitional coughs.  Cuing for extra effortful  swallows was largely ineffective for clearing residue from the pharynx.   Pt also demonstrated deep penetration to the level of the cords with  pureed solid consistencies and increased pyriform residue which increases  his risk of aspiration post swallow.  Given the abovementioned deficits,  pt's significantly weakened state, and his anxiety regarding PO intake,  recommend that pt remain NPO and that medical team consider PEG placement.   PA made aware.  Prognosis for advancement good with continued trials of  ice chips and time for recovery.  Pharyngeal strengthening exercises are  most likely contraindicated due to the progressive nature of pt's  weakness.         CHL IP TREATMENT RECOMMENDATION 12/12/2014  Treatment Plan Recommendations See plan of care      CHL IP DIET RECOMMENDATION 12/25/2014  Diet Recommendations Alternative means - long-term;NPO  Liquid Administration via (None)  Medication Administration Via alternative means  Compensations (None)  Postural Changes and/or Swallow Maneuvers (None)     CHL IP OTHER RECOMMENDATIONS 12/25/2014  Recommended Consults (None)  Oral Care Recommendations Oral care Q4 per protocol  Other Recommendations Have oral suction available               CHL IP REASON FOR REFERRAL 12/25/2014  Reason for Referral Objectively evaluate swallowing function     CHL IP ORAL PHASE 12/25/2014  Oral Phase  Impaired                          Oral - Honey Teaspoon Weak lingual manipulation;Lingual pumping;Incomplete  tongue to palate  contact;Reduced posterior propulsion;Piecemeal  swallowing;Delayed oral transit;Lingual/palatal residue        Oral - Nectar Teaspoon Lingual pumping;Incomplete tongue to palate  contact;Reduced posterior propulsion;Weak lingual manipulation;Piecemeal  swallowing;Delayed oral transit;Lingual/palatal residue              Oral - Thin Teaspoon Lingual pumping;Incomplete tongue to palate  contact;Reduced posterior propulsion;Weak lingual manipulation;Piecemeal  swallowing;Delayed oral transit;Lingual/palatal residue           Oral - Puree Lingual pumping;Incomplete tongue to palate contact;Reduced  posterior propulsion;Weak lingual manipulation;Piecemeal  swallowing;Delayed oral transit;Lingual/palatal residue                     CHL  IP PHARYNGEAL PHASE 12/25/2014  Pharyngeal Phase Impaired              Pharyngeal - Honey Teaspoon Delayed swallow initiation;Premature spillage  to pyriform sinuses;Reduced laryngeal elevation;Reduced tongue base  retraction;Reduced epiglottic inversion;Reduced anterior laryngeal  mobility;Reduced airway/laryngeal closure;Reduced pharyngeal  peristalsis;Penetration/Aspiration during swallow;Pharyngeal residue -  pyriform sinuses;Pharyngeal residue - posterior pharnyx  Penetration/Aspiration details (honey teaspoon) Material enters airway,  passes BELOW cords and not ejected out despite cough attempt by patient              Pharyngeal - Nectar Teaspoon Delayed swallow initiation;Premature spillage  to pyriform sinuses;Reduced epiglottic inversion;Reduced laryngeal  elevation;Reduced tongue base retraction;Penetration/Aspiration during  swallow;Pharyngeal residue - pyriform sinuses;Pharyngeal residue -  posterior pharnyx;Reduced airway/laryngeal closure;Reduced anterior  laryngeal mobility;Reduced pharyngeal peristalsis  Penetration/Aspiration details (nectar teaspoon) Material enters airway,  passes BELOW cords and not ejected out despite cough attempt by patient                           Pharyngeal - Thin Teaspoon Reduced epiglottic inversion;Reduced laryngeal  elevation;Premature spillage to pyriform sinuses;Delayed swallow  initiation;Reduced tongue base retraction;Reduced airway/laryngeal  closure;Reduced anterior laryngeal mobility;Penetration/Aspiration during  swallow;Pharyngeal residue - pyriform sinuses  Penetration/Aspiration details (thin teaspoon) Material enters airway,  passes BELOW cords and not ejected out despite cough attempt by patient                    Pharyngeal - Puree Delayed swallow initiation;Premature spillage to  valleculae;Reduced tongue base retraction;Reduced laryngeal  elevation;Reduced epiglottic inversion;Pharyngeal residue -  valleculae;Pharyngeal residue - pyriform sinuses;Pharyngeal residue -  posterior pharnyx;Penetration/Aspiration during swallow  Penetration/Aspiration details (puree) Material enters airway, CONTACTS  cords and not ejected out                                        Windell Moulding L 12/25/2014, 10:30 AM     EKG: Independently reviewed. Atrial fibrillation with nonspecific ST-T changes.  Assessment/Plan Principal Problem:   Chest pain Active Problems:   Myasthenia gravis in crisis   HTN (hypertension)   Chronic atrial fibrillation   1. Chest pain - present which is pain-free. We will cycle cardiac markers to rule out ACS. Check chest x-ray. Also will check CT angiogram of the chest to rule out PE. Patient was until last 2 days on xarelto which has been held for PEG tube placement and will be restarted today. Check 2-D echo. Aspirin. 2. Chronic atrial fibrillation - presently rate controlled. Continue home medications. With regarding to xarelto see #1. 3. Myasthenia gravis - pacing in crisis was on plasmapheresis presently on prednisone and Mestinon. Closely observe. 4. Recent C. Difficile - diarrhea resolved patient is off Flagyl. 5. Dysphagia status post PEG tube placement.   DVT Prophylaxis SCDs and there'll be started today  later.  Code Status: Full code.  Family Communication: None.  Disposition Plan: Admit for observation.    KAKRAKANDY,ARSHAD N. [facility] Pager [phone].  If 7PM-7AM, please contact night-coverage [email or web address] Password [email or web address] 12/27/2014, 2:08 AM

## 2014-12-27 NOTE — Progress Notes (Signed)
INITIAL NUTRITION ASSESSMENT  DOCUMENTATION CODES Per approved criteria  -Obesity Unspecified   INTERVENTION:  Recommend initiate TF via G-tube with Jevity 1.2 at 25 ml/h, increase by 10 ml every 4 hours to goal rate of 65 ml/h with Prostat 30 ml once daily to provide 1972 kcals, 102 gm protein, 1264 ml free water daily.  NUTRITION DIAGNOSIS: Inadequate oral intake related to dysphagia as evidenced by NPO status.   Goal: Intake to meet >90% of estimated nutrition needs.  Monitor:  TF tolerance/adequacy, weight trend, labs.  Reason for Assessment: MST, TF  74 y.o. male  Admitting Dx: Chest pain  ASSESSMENT: Patient was transferred to [facility] from rehab unit on 2/13 with chest pain and pressure. Recently admitted for myasthenia gravis crisis, requiring plasmapheresis, then transferred to rehab unit. S/P G-tube placement for dysphagia on 2/12.  Per discussion with RN, patient to start TF today. Per discussion with patient's wife, patient was receiving TF via NGT during rehab stay. Received Jevity 1.2 at 75 ml/h x 20 hrs with Prostat 30 ml once daily (1800 kcals, 98 gm protein per day). Just had G-tube placed yesterday, has only been used for medications so far. His usual weight is 236-240 lbs. Nutrition focused physical exam completed.  No muscle or subcutaneous fat depletion noticed.  Height: Ht Readings from Last 1 Encounters:  12/05/14 5\' 7"  (1.702 m)    Weight: Wt Readings from Last 1 Encounters:  12/27/14 232 lb 7.8 oz (105.455 kg)    Ideal Body Weight: 67.3 kg  % Ideal Body Weight: 157%  Wt Readings from Last 10 Encounters:  12/27/14 232 lb 7.8 oz (105.455 kg)  12/17/14 236 lb 12.4 oz (107.4 kg)  05/03/10 290 lb (131.543 kg)    Usual Body Weight: 236-240 lbs per wife  % Usual Body Weight: 98%  BMI:  Body mass index is 36.4 kg/(m^2). class 2 obesity  Estimated Nutritional Needs: Kcal: 1850-2050 Protein: 95-110 gm Fluid: >/= 1.8 L  Skin: no issues  Diet  Order:  NPO  EDUCATION NEEDS: -Education not appropriate at this time  No intake or output data in the 24 hours ending 12/27/14 0921  Last BM: 2/12   Labs:   Recent Labs Lab 12/25/14 0600 12/27/14 0300 12/27/14 0800  NA 141 139 138  K 3.7 4.1 3.6  CL 104 103 99  CO2 32 32 31  BUN 11 7 9   CREATININE 0.71 0.67 0.60  CALCIUM 8.7 8.5 8.3*  GLUCOSE 130* 170* 123*    CBG (last 3)   Recent Labs  12/26/14 2236 12/27/14 0152 12/27/14 0800  GLUCAP 189* 152* 112*    Scheduled Meds: . amiodarone  100 mg Per Tube Daily  . aspirin EC  325 mg Oral Daily  . atorvastatin  40 mg Per Tube Daily  . chlorhexidine  15 mL Mouth Rinse BID  . free water  200 mL Per Tube 6 times per day  . metoprolol tartrate  12.5 mg Per Tube Daily  . predniSONE  60 mg Per Tube Q breakfast  . pyridostigmine  90 mg Per NG tube QID  . rivaroxaban  20 mg Oral Q supper  . sodium chloride  10-40 mL Intracatheter Q12H  . sodium chloride  3 mL Intravenous Q12H    Continuous Infusions: . feeding supplement (JEVITY 1.2 CAL)    . feeding supplement (VITAL AF 1.2 CAL)      Past Medical History  Diagnosis Date  . Atrial fibrillation, chronic  on Xarelto   . HTN (hypertension)   . CHF (congestive heart failure)   . Diabetes mellitus   . 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 Abdominal wall metastasis removal      Molli Barrows, RD, LDN, [place] Pager [phone] After Hours Pager [phone]

## 2014-12-28 ENCOUNTER — Observation Stay (HOSPITAL_COMMUNITY): Payer: PPO

## 2014-12-28 ENCOUNTER — Inpatient Hospital Stay (HOSPITAL_COMMUNITY): Payer: PPO | Admitting: Physical Therapy

## 2014-12-28 DIAGNOSIS — E11618 Type 2 diabetes mellitus with other diabetic arthropathy: Secondary | ICD-10-CM

## 2014-12-28 DIAGNOSIS — R079 Chest pain, unspecified: Secondary | ICD-10-CM | POA: Diagnosis not present

## 2014-12-28 LAB — BLOOD GAS, ARTERIAL
Acid-Base Excess: 7.9 mmol/L — ABNORMAL HIGH (ref 0.0–2.0)
Bicarbonate: 31.9 mEq/L — ABNORMAL HIGH (ref 20.0–24.0)
Drawn by: 41881
O2 Content: 2 L/min
O2 SAT: 92.8 %
PO2 ART: 65.6 mmHg — AB (ref 80.0–100.0)
Patient temperature: 98.6
TCO2: 33.3 mmol/L (ref 0–100)
pCO2 arterial: 45.3 mmHg — ABNORMAL HIGH (ref 35.0–45.0)
pH, Arterial: 7.463 — ABNORMAL HIGH (ref 7.350–7.450)

## 2014-12-28 LAB — GLUCOSE, CAPILLARY
GLUCOSE-CAPILLARY: 223 mg/dL — AB (ref 70–99)
Glucose-Capillary: 173 mg/dL — ABNORMAL HIGH (ref 70–99)
Glucose-Capillary: 212 mg/dL — ABNORMAL HIGH (ref 70–99)
Glucose-Capillary: 228 mg/dL — ABNORMAL HIGH (ref 70–99)
Glucose-Capillary: 266 mg/dL — ABNORMAL HIGH (ref 70–99)
Glucose-Capillary: 176 mg/dL — ABNORMAL HIGH (ref 70–99)

## 2014-12-28 LAB — BRAIN NATRIURETIC PEPTIDE: B Natriuretic Peptide: 250.1 pg/mL — ABNORMAL HIGH (ref 0.0–100.0)

## 2014-12-28 MED ORDER — HYDROCODONE-HOMATROPINE 5-1.5 MG/5ML PO SYRP
5.0000 mL | ORAL_SOLUTION | Freq: Once | ORAL | Status: AC
  Administered 2014-12-28: 5 mL via ORAL
  Filled 2014-12-28: qty 5

## 2014-12-28 MED ORDER — FAMOTIDINE 20 MG PO TABS
40.0000 mg | ORAL_TABLET | Freq: Every day | ORAL | Status: DC
  Administered 2014-12-28 – 2014-12-29 (×2): 40 mg via ORAL
  Filled 2014-12-28: qty 2

## 2014-12-28 MED ORDER — FUROSEMIDE 10 MG/ML IJ SOLN
30.0000 mg | Freq: Once | INTRAMUSCULAR | Status: AC
  Administered 2014-12-28: 30 mg via INTRAVENOUS
  Filled 2014-12-28: qty 4

## 2014-12-28 MED ORDER — INSULIN ASPART 100 UNIT/ML ~~LOC~~ SOLN
0.0000 [IU] | SUBCUTANEOUS | Status: DC
  Administered 2014-12-28: 5 [IU] via SUBCUTANEOUS
  Administered 2014-12-28: 3 [IU] via SUBCUTANEOUS
  Administered 2014-12-28: 5 [IU] via SUBCUTANEOUS
  Administered 2014-12-28: 8 [IU] via SUBCUTANEOUS
  Administered 2014-12-28: 3 [IU] via SUBCUTANEOUS
  Administered 2014-12-28 – 2014-12-29 (×4): 5 [IU] via SUBCUTANEOUS
  Administered 2014-12-29: 8 [IU] via SUBCUTANEOUS
  Administered 2014-12-29: 3 [IU] via SUBCUTANEOUS

## 2014-12-28 NOTE — Progress Notes (Signed)
Utilization Review Completed.   Kirubel Aja, RN, BSN Nurse Case Manager  

## 2014-12-28 NOTE — Progress Notes (Signed)
[facility] PROGRESS NOTE  Craig [name] DPO:[id number] DOB: January 31, 1941 DOA: 12/27/2014 PCP: Ann Held, MD  Assessment/Plan: 1-chest pain: Appears to be secondary to 50 air after insertion of PEG tube plus GERD -Troponins negative 3 and no abnormalities seen on EKG or telemetry -Patient is currently pain free and is tolerating tube feedings without any difficulties -No shortness of breath. Ex line-has been started on famotidine 40 mg daily (no PPIs secondary to recent C. difficile infection) -Of note, patient has a CTA that was negative for pulmonary embolism and/or infiltrates  2-chronic atrial fibrillation: Rate controlled. -Continue amiodarone, metoprolol and xarelto  3-GERD: will treat with famotidine  4-severe protein calorie malnutrition: Patient with PEG tube insertion and is currently receiving tube feedings -Follow dietitian recommendations calorie intake and free water  5-myasthenia gravis: Continue Mestinon and prednisone  6-recent C. difficile infection: No further diarrhea appreciated. Patient completed 21 days of antibiotics with Flagyl.  7-hyperlipidemia: Continue statins.  8-history of diabetes: A1c ordered -CBGs elevated due to to feedings -Will start SSI  Code Status: Full Family Communication: Daughter at bedside Disposition Plan: To be determined. Patient is stable and clear for discharge back to inpatient rehabilitation service   Consultants:  IR: For evaluation on tube feedings  Procedures:  See below for x-ray reports  Antibiotics:  None   HPI/Subjective: Patient feeling okay. No fever. Denies chest pain or shortness of breath. Tolerating tube feedings without any difficulties.  Objective: Filed Vitals:   12/28/14 1322  BP: 114/68  Pulse:   Temp: 97.5 F (36.4 C)  Resp: 18    Intake/Output Summary (Last 24 hours) at 12/28/14 1543 Last data filed at 12/28/14 1237  Gross per 24 hour  Intake   1629 ml  Output    990 ml  Net     639 ml   Filed Weights   12/27/14 0159 12/28/14 1004  Weight: 105.455 kg (232 lb 7.8 oz) 102.422 kg (225 lb 12.8 oz)    Exam:   General:  Afebrile, denies chest pain; no nausea or vomiting.  Cardiovascular: Rate controlled; no rubs or gallops; no JVD appreciated on exam  Respiratory: Good air movement bilaterally; no wheezing, no crackles. Patient with nasal cannula in place (2L)  Abdomen: PEG tube in place, good bowel sounds, no distention, no guarding; soft abdomen on examination.  Musculoskeletal: No cyanosis or clubbing  Data Reviewed: Basic Metabolic Panel:  Recent Labs Lab 12/25/14 0600 12/27/14 0300 12/27/14 0800  NA 141 139 138  K 3.7 4.1 3.6  CL 104 103 99  CO2 32 32 31  GLUCOSE 130* 170* 123*  BUN 11 7 9   CREATININE 0.71 0.67 0.60  CALCIUM 8.7 8.5 8.3*   Liver Function Tests:  Recent Labs Lab 12/27/14 0300  AST 35  ALT 54*  ALKPHOS 46  BILITOT 0.9  PROT 5.2*  ALBUMIN 3.0*   CBC:  Recent Labs Lab 12/26/14 0525 12/27/14 0300 12/27/14 0800  WBC 10.1 11.1* 11.3*  NEUTROABS  --  10.1*  --   HGB 12.5* 13.1 13.3  HCT 37.3* 39.3 39.8  MCV 92.8 94.7 93.2  PLT 226 202 213   Cardiac Enzymes:  Recent Labs Lab 12/26/14 2305 12/27/14 0300 12/27/14 0800  CKTOTAL 41  --   --   CKMB 1.6  --   --   TROPONINI <0.03 <0.03 <0.03   CBG:  Recent Labs Lab 12/27/14 2058 12/28/14 0028 12/28/14 0420 12/28/14 0752 12/28/14 1217  GLUCAP 196* 223* 173* 212* 228*  Recent Results (from the past 240 hour(s))  MRSA PCR Screening     Status: None   Collection Time: 12/27/14  3:20 AM  Result Value Ref Range Status   MRSA by PCR NEGATIVE NEGATIVE Final    Comment:        The GeneXpert MRSA Assay (FDA approved for NASAL specimens only), is one component of a comprehensive MRSA colonization surveillance program. It is not intended to diagnose MRSA infection nor to guide or monitor treatment for MRSA infections.      Studies: Ct Angio  Chest Pe W/cm &/or Wo Cm  12/27/2014   CLINICAL DATA:  Patient complaining of chest pain and left shoulder pain. Diaphoretic.  EXAM: CT ANGIOGRAPHY CHEST WITH CONTRAST  TECHNIQUE: Multidetector CT imaging of the chest was performed using the standard protocol during bolus administration of intravenous contrast. Multiplanar CT image reconstructions and MIPs were obtained to evaluate the vascular anatomy.  CONTRAST:  14mL OMNIPAQUE IOHEXOL 350 MG/ML SOLN  COMPARISON:  Chest radiograph 12/27/2014; abdomen pelvic CT 12/25/2014  FINDINGS: Adequate opacification of the pulmonary arterial system. Evaluation of the subsegmental pulmonary arteries limited due to motion artifact. Within the above limitation, no definite evidence for pulmonary embolism.  Left anterior chest wall Port-A-Cath is present with tip terminating in the superior vena cava. Right-sided central venous catheter is present with tip terminating at the superior cavoatrial junction. Heart is enlarged. No pericardial effusion. Coronary arterial vascular calcifications. No axillary, mediastinal or hilar lymphadenopathy. Small hiatal hernia.  The central airways are patent. There is dependent atelectasis within the bilateral lower lobes. No definite pleural effusion or pneumothorax.  Visualization of the upper abdomen demonstrates interval placement of gastrostomy tube. Tube appears in appropriate position. There is a small amount of free intraperitoneal air adjacent to the anterior aspect of the stomach, likely postprocedural in etiology. Small gallstone within the gallbladder lumen. Unchanged bilateral adrenal thickening. Re- demonstrated omental nodularity. 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  Degenerative changes of the thoracic spine without aggressive or acute appearing osseous lesions.  Review of the MIP images confirms the above findings.  IMPRESSION: No evidence for pulmonary embolism. Limited evaluation of the subsegmental pulmonary  arteries due to motion artifact.  Interval placement of gastrostomy tube. Small amount of free intraperitoneal air adjacent to the anterior margin the stomach, likely postprocedural in etiology.  Re- demonstrated omental nodularity, compatible with metastatic disease.  Celiac origin aneurysm, stable.  Bilateral lower lobe atelectatic changes.  Critical Value/emergent results were called by telephone at the time of interpretation on 12/27/2014 at 8:00 am to Nurse [facility] , who verbally acknowledged these results and will page Dr. Dyann Kief.   Electronically Signed   By: Lovey Newcomer M.D.   On: 12/27/2014 08:03   Dg Chest Port 1 View  12/27/2014   CLINICAL DATA:  Acute onset of chest pain and shortness of breath. Initial encounter.  EXAM: PORTABLE CHEST - 1 VIEW  COMPARISON:  Chest radiograph performed 12/11/2014  FINDINGS: The lungs are hypoexpanded. Minimal left basilar opacity likely reflects atelectasis. There is no evidence of pleural effusion or pneumothorax.  The cardiomediastinal silhouette is borderline enlarged. A right IJ dual lumen catheter seen ending about the mid SVC. A left-sided chest port is noted ending about the distal SVC. No acute osseous abnormalities are seen.  IMPRESSION: Lungs hypoexpanded. Minimal left basilar opacity likely reflects atelectasis; lungs otherwise grossly clear.   Electronically Signed   By: Garald Balding M.D.   On: 12/27/2014 02:55  Scheduled Meds: . amiodarone  100 mg Per Tube Daily  . aspirin  325 mg Oral Daily  . atorvastatin  40 mg Per Tube Daily  . chlorhexidine  15 mL Mouth Rinse BID  . free water  200 mL Per Tube 6 times per day  . insulin aspart  0-15 Units Subcutaneous 6 times per day  . metoprolol tartrate  12.5 mg Per Tube Daily  . predniSONE  60 mg Per Tube Q breakfast  . pyridostigmine  90 mg Per NG tube QID  . rivaroxaban  20 mg Oral Q supper  . sodium chloride  10-40 mL Intracatheter Q12H  . sodium chloride  3 mL Intravenous Q12H    Continuous Infusions: . feeding supplement (JEVITY 1.2 CAL) 1,000 mL (12/28/14 1315)    Principal Problem:   Chest pain Active Problems:   Myasthenia gravis in crisis   HTN (hypertension)   Chronic atrial fibrillation    Time spent: 30 minutes    Barton Dubois  [facility] Pager [phone]. If 7PM-7AM, please contact night-coverage at [email or web address], password [email or web address] 12/28/2014, 3:43 PM  LOS: 1 day

## 2014-12-28 NOTE — Progress Notes (Signed)
Shift Event: Pt with c/o's of sob, lungs with crackles, 92% on RA, other VSS. BNP 250, CXR- lung volumes lower than prior exam, no active disease. ABG 7.4/45/65, LA 1.5.  Will continue to monitor  Update: much improvement in respiratory status, pt in NAD. Placed on BiPAP PRN for sleep  Lacy Duverney [facility]

## 2014-12-29 ENCOUNTER — Inpatient Hospital Stay (HOSPITAL_COMMUNITY)
Admission: RE | Admit: 2014-12-29 | Discharge: 2015-01-02 | DRG: 057 | Disposition: A | Discharge status: Home Health | Payer: PPO | Source: Intra-hospital | Attending: Physical Medicine & Rehabilitation | Admitting: Physical Medicine & Rehabilitation

## 2014-12-29 ENCOUNTER — Encounter (HOSPITAL_COMMUNITY): Payer: PPO | Admitting: Occupational Therapy

## 2014-12-29 ENCOUNTER — Inpatient Hospital Stay (HOSPITAL_COMMUNITY): Payer: PPO

## 2014-12-29 ENCOUNTER — Inpatient Hospital Stay (HOSPITAL_COMMUNITY): Payer: PPO | Admitting: Speech Pathology

## 2014-12-29 DIAGNOSIS — Z79899 Other long term (current) drug therapy: Secondary | ICD-10-CM | POA: Diagnosis not present

## 2014-12-29 DIAGNOSIS — C679 Malignant neoplasm of bladder, unspecified: Secondary | ICD-10-CM | POA: Diagnosis present

## 2014-12-29 DIAGNOSIS — I509 Heart failure, unspecified: Secondary | ICD-10-CM | POA: Diagnosis present

## 2014-12-29 DIAGNOSIS — Z794 Long term (current) use of insulin: Secondary | ICD-10-CM | POA: Diagnosis not present

## 2014-12-29 DIAGNOSIS — A047 Enterocolitis due to Clostridium difficile: Secondary | ICD-10-CM | POA: Diagnosis present

## 2014-12-29 DIAGNOSIS — I10 Essential (primary) hypertension: Secondary | ICD-10-CM | POA: Diagnosis present

## 2014-12-29 DIAGNOSIS — R1314 Dysphagia, pharyngoesophageal phase: Secondary | ICD-10-CM | POA: Diagnosis present

## 2014-12-29 DIAGNOSIS — R1312 Dysphagia, oropharyngeal phase: Secondary | ICD-10-CM | POA: Diagnosis present

## 2014-12-29 DIAGNOSIS — I482 Chronic atrial fibrillation: Secondary | ICD-10-CM | POA: Diagnosis present

## 2014-12-29 DIAGNOSIS — Z7901 Long term (current) use of anticoagulants: Secondary | ICD-10-CM | POA: Diagnosis not present

## 2014-12-29 DIAGNOSIS — E876 Hypokalemia: Secondary | ICD-10-CM | POA: Diagnosis present

## 2014-12-29 DIAGNOSIS — G7000 Myasthenia gravis without (acute) exacerbation: Secondary | ICD-10-CM | POA: Diagnosis present

## 2014-12-29 DIAGNOSIS — G7001 Myasthenia gravis with (acute) exacerbation: Secondary | ICD-10-CM

## 2014-12-29 DIAGNOSIS — Z8551 Personal history of malignant neoplasm of bladder: Secondary | ICD-10-CM | POA: Diagnosis not present

## 2014-12-29 DIAGNOSIS — R079 Chest pain, unspecified: Secondary | ICD-10-CM | POA: Diagnosis not present

## 2014-12-29 DIAGNOSIS — Z7952 Long term (current) use of systemic steroids: Secondary | ICD-10-CM

## 2014-12-29 DIAGNOSIS — E43 Unspecified severe protein-calorie malnutrition: Secondary | ICD-10-CM

## 2014-12-29 DIAGNOSIS — A0472 Enterocolitis due to Clostridium difficile, not specified as recurrent: Secondary | ICD-10-CM | POA: Diagnosis present

## 2014-12-29 DIAGNOSIS — K219 Gastro-esophageal reflux disease without esophagitis: Secondary | ICD-10-CM

## 2014-12-29 LAB — CBC
HEMATOCRIT: 40.4 % (ref 39.0–52.0)
Hemoglobin: 13.6 g/dL (ref 13.0–17.0)
MCH: 31.3 pg (ref 26.0–34.0)
MCHC: 33.7 g/dL (ref 30.0–36.0)
MCV: 93.1 fL (ref 78.0–100.0)
Platelets: 201 10*3/uL (ref 150–400)
RBC: 4.34 MIL/uL (ref 4.22–5.81)
RDW: 14.5 % (ref 11.5–15.5)
WBC: 15.8 10*3/uL — ABNORMAL HIGH (ref 4.0–10.5)

## 2014-12-29 LAB — BASIC METABOLIC PANEL
Anion gap: 8 (ref 5–15)
BUN: 12 mg/dL (ref 6–23)
CO2: 33 mmol/L — AB (ref 19–32)
Calcium: 8.3 mg/dL — ABNORMAL LOW (ref 8.4–10.5)
Chloride: 99 mmol/L (ref 96–112)
Creatinine, Ser: 0.68 mg/dL (ref 0.50–1.35)
GFR calc Af Amer: >90 mL/min (ref >90)
GFR calc non Af Amer: >90 mL/min (ref >90)
GLUCOSE: 197 mg/dL — AB (ref 70–99)
POTASSIUM: 3.1 mmol/L — AB (ref 3.5–5.1)
Sodium: 140 mmol/L (ref 135–145)

## 2014-12-29 LAB — GLUCOSE, CAPILLARY
GLUCOSE-CAPILLARY: 203 mg/dL — AB (ref 70–99)
GLUCOSE-CAPILLARY: 199 mg/dL — AB (ref 70–99)
GLUCOSE-CAPILLARY: 253 mg/dL — AB (ref 70–99)
GLUCOSE-CAPILLARY: 247 mg/dL — AB (ref 70–99)
GLUCOSE-CAPILLARY: 162 mg/dL — AB (ref 70–99)
Glucose-Capillary: 150 mg/dL — ABNORMAL HIGH (ref 70–99)
Glucose-Capillary: 205 mg/dL — ABNORMAL HIGH (ref 70–99)
Glucose-Capillary: 210 mg/dL — ABNORMAL HIGH (ref 70–99)

## 2014-12-29 LAB — HEMOGLOBIN A1C
Hgb A1c MFr Bld: 6.9 % — ABNORMAL HIGH (ref 4.8–5.6)
MEAN PLASMA GLUCOSE: 151 mg/dL

## 2014-12-29 LAB — LACTIC ACID, PLASMA
LACTIC ACID, VENOUS: 1.5 mmol/L (ref 0.5–2.0)
Lactic Acid, Venous: 1.6 mmol/L (ref 0.5–2.0)

## 2014-12-29 MED ORDER — SODIUM CHLORIDE 0.9 % IJ SOLN: 10.0000 mL | Freq: Two times a day (BID) | INTRAMUSCULAR | Status: DC

## 2014-12-29 MED ORDER — ONDANSETRON HCL 4 MG PO TABS: 4.0000 mg | ORAL_TABLET | Freq: Four times a day (QID) | ORAL | Status: DC | PRN

## 2014-12-29 MED ORDER — METOPROLOL TARTRATE 25 MG PO TABS: 12.5000 mg | ORAL_TABLET | Freq: Every day | ORAL | Status: DC

## 2014-12-29 MED ORDER — JEVITY 1.2 CAL PO LIQD: 1000.0000 mL | ORAL | Status: DC

## 2014-12-29 MED ORDER — IPRATROPIUM BROMIDE 0.02 % IN SOLN: 0.5000 mg | Freq: Four times a day (QID) | RESPIRATORY_TRACT | Status: DC | PRN

## 2014-12-29 MED ORDER — PREDNISONE 50 MG PO TABS
60.0000 mg | ORAL_TABLET | Freq: Every day | ORAL | Status: DC
  Administered 2014-12-30 – 2015-01-02 (×4): 60 mg
  Filled 2014-12-29 (×6): qty 1

## 2014-12-29 MED ORDER — ATORVASTATIN CALCIUM 40 MG PO TABS
40.0000 mg | ORAL_TABLET | Freq: Every day | ORAL | Status: DC
  Administered 2014-12-30 – 2015-01-01 (×3): 40 mg
  Filled 2014-12-29 (×4): qty 1

## 2014-12-29 MED ORDER — PYRIDOSTIGMINE BROMIDE 60 MG PO TABS: 90.0000 mg | ORAL_TABLET | Freq: Four times a day (QID) | ORAL | Status: DC

## 2014-12-29 MED ORDER — PYRIDOSTIGMINE BROMIDE 60 MG PO TABS
90.0000 mg | ORAL_TABLET | Freq: Four times a day (QID) | ORAL | Status: DC
  Administered 2014-12-29 – 2015-01-02 (×13): 90 mg via NASOGASTRIC
  Filled 2014-12-29 (×20): qty 1.5

## 2014-12-29 MED ORDER — JEVITY 1.2 CAL PO LIQD
1000.0000 mL | ORAL | Status: DC
  Administered 2014-12-29 – 2014-12-30 (×2): 1000 mL
  Filled 2014-12-29 (×3): qty 1000

## 2014-12-29 MED ORDER — CHLORHEXIDINE GLUCONATE 0.12 % MT SOLN: 15.0000 mL | Freq: Two times a day (BID) | OROMUCOSAL | Status: DC

## 2014-12-29 MED ORDER — ASPIRIN 325 MG PO TABS: 325.0000 mg | ORAL_TABLET | Freq: Every day | ORAL | Status: DC

## 2014-12-29 MED ORDER — INSULIN ASPART 100 UNIT/ML ~~LOC~~ SOLN
4.0000 [IU] | Freq: Four times a day (QID) | SUBCUTANEOUS | Status: DC
  Administered 2014-12-30 (×2): 4 [IU] via SUBCUTANEOUS

## 2014-12-29 MED ORDER — ATORVASTATIN CALCIUM 40 MG PO TABS: 40.0000 mg | ORAL_TABLET | Freq: Every day | ORAL | Status: AC

## 2014-12-29 MED ORDER — RIVAROXABAN 20 MG PO TABS: 20.0000 mg | ORAL_TABLET | Freq: Every day | ORAL | Status: AC

## 2014-12-29 MED ORDER — POTASSIUM CHLORIDE 20 MEQ/15ML (10%) PO SOLN
20.0000 meq | Freq: Every day | ORAL | Status: DC
  Administered 2014-12-30: 20 meq
  Filled 2014-12-29 (×2): qty 15

## 2014-12-29 MED ORDER — METOPROLOL TARTRATE 12.5 MG HALF TABLET
12.5000 mg | ORAL_TABLET | Freq: Every day | ORAL | Status: DC
  Administered 2014-12-30: 12.5 mg
  Filled 2014-12-29 (×2): qty 1

## 2014-12-29 MED ORDER — RIVAROXABAN 20 MG PO TABS
20.0000 mg | ORAL_TABLET | Freq: Every day | ORAL | Status: DC
  Administered 2014-12-30 – 2015-01-01 (×3): 20 mg via ORAL
  Filled 2014-12-29 (×4): qty 1

## 2014-12-29 MED ORDER — ONDANSETRON HCL 4 MG/2ML IJ SOLN: 4.0000 mg | Freq: Four times a day (QID) | INTRAMUSCULAR | Status: DC | PRN

## 2014-12-29 MED ORDER — SENNA 8.6 MG PO TABS: 1.0000 | ORAL_TABLET | Freq: Every day | ORAL | Status: DC | PRN

## 2014-12-29 MED ORDER — INSULIN ASPART 100 UNIT/ML ~~LOC~~ SOLN: 4.0000 [IU] | Freq: Four times a day (QID) | SUBCUTANEOUS | Status: DC

## 2014-12-29 MED ORDER — FREE WATER
200.0000 mL | Status: DC
  Administered 2014-12-29 – 2014-12-30 (×4): 200 mL

## 2014-12-29 MED ORDER — POTASSIUM CHLORIDE ER 10 MEQ PO TBCR: 20.0000 meq | EXTENDED_RELEASE_TABLET | Freq: Every day | ORAL | Status: DC

## 2014-12-29 MED ORDER — IPRATROPIUM BROMIDE 0.02 % IN SOLN
0.5000 mg | Freq: Four times a day (QID) | RESPIRATORY_TRACT | Status: DC | PRN
  Administered 2014-12-30: 0.5 mg via RESPIRATORY_TRACT
  Filled 2014-12-29: qty 2.5

## 2014-12-29 MED ORDER — INSULIN ASPART 100 UNIT/ML ~~LOC~~ SOLN
4.0000 [IU] | Freq: Four times a day (QID) | SUBCUTANEOUS | Status: DC
  Administered 2014-12-29: 4 [IU] via SUBCUTANEOUS

## 2014-12-29 MED ORDER — INSULIN ASPART 100 UNIT/ML ~~LOC~~ SOLN
0.0000 [IU] | SUBCUTANEOUS | Status: DC
  Administered 2014-12-29: 5 [IU] via SUBCUTANEOUS
  Administered 2014-12-30: 2 [IU] via SUBCUTANEOUS
  Administered 2014-12-30: 3 [IU] via SUBCUTANEOUS

## 2014-12-29 MED ORDER — MORPHINE SULFATE 2 MG/ML IJ SOLN: 1.0000 mg | INTRAMUSCULAR | Status: DC | PRN

## 2014-12-29 MED ORDER — HYDROCODONE-ACETAMINOPHEN 5-325 MG PO TABS
1.0000 | ORAL_TABLET | Freq: Four times a day (QID) | ORAL | Status: DC | PRN
  Administered 2014-12-29 – 2015-01-01 (×5): 1
  Filled 2014-12-29 (×5): qty 1

## 2014-12-29 MED ORDER — INSULIN ASPART 100 UNIT/ML ~~LOC~~ SOLN: 0.0000 [IU] | SUBCUTANEOUS | Status: DC

## 2014-12-29 MED ORDER — SODIUM CHLORIDE 0.9 % IJ SOLN
10.0000 mL | INTRAMUSCULAR | Status: DC | PRN
  Administered 2014-12-30 (×3): 10 mL
  Filled 2014-12-29 (×3): qty 40

## 2014-12-29 MED ORDER — BISACODYL 10 MG RE SUPP: 10.0000 mg | Freq: Every day | RECTAL | Status: DC | PRN

## 2014-12-29 MED ORDER — PREDNISONE 20 MG PO TABS: 60.0000 mg | ORAL_TABLET | Freq: Every day | ORAL | Status: DC

## 2014-12-29 MED ORDER — FLEET ENEMA 7-19 GM/118ML RE ENEM: 1.0000 | ENEMA | Freq: Once | RECTAL | Status: AC | PRN

## 2014-12-29 MED ORDER — ARTIFICIAL TEARS OP OINT: TOPICAL_OINTMENT | OPHTHALMIC | Status: AC | PRN

## 2014-12-29 MED ORDER — FAMOTIDINE 40 MG PO TABS
40.0000 mg | ORAL_TABLET | Freq: Every day | ORAL | Status: DC
  Administered 2014-12-30: 40 mg
  Filled 2014-12-29 (×2): qty 1

## 2014-12-29 MED ORDER — CHLORHEXIDINE GLUCONATE 0.12 % MT SOLN
15.0000 mL | Freq: Two times a day (BID) | OROMUCOSAL | Status: DC
  Administered 2014-12-29 – 2015-01-02 (×8): 15 mL via OROMUCOSAL
  Filled 2014-12-29 (×11): qty 15

## 2014-12-29 MED ORDER — POTASSIUM CHLORIDE 20 MEQ PO PACK
20.0000 meq | PACK | Freq: Every day | ORAL | Status: DC
Start: 2014-12-30 — End: 2014-12-29
  Filled 2014-12-29: qty 1

## 2014-12-29 MED ORDER — ARTIFICIAL TEARS OP OINT: TOPICAL_OINTMENT | OPHTHALMIC | Status: DC | PRN

## 2014-12-29 MED ORDER — ACETAMINOPHEN 325 MG PO TABS
325.0000 mg | ORAL_TABLET | ORAL | Status: DC | PRN
  Administered 2014-12-30 – 2015-01-01 (×4): 650 mg
  Filled 2014-12-29 (×4): qty 2

## 2014-12-29 MED ORDER — FAMOTIDINE 40 MG PO TABS: 40.0000 mg | ORAL_TABLET | Freq: Every day | ORAL | Status: DC

## 2014-12-29 MED ORDER — ACETAMINOPHEN 325 MG PO TABS: 650.0000 mg | ORAL_TABLET | Freq: Four times a day (QID) | ORAL | Status: DC | PRN

## 2014-12-29 MED ORDER — AMIODARONE HCL 100 MG PO TABS
100.0000 mg | ORAL_TABLET | Freq: Every day | ORAL | Status: DC
  Administered 2014-12-30 – 2015-01-02 (×4): 100 mg
  Filled 2014-12-29 (×7): qty 1

## 2014-12-29 MED ORDER — ASPIRIN 325 MG PO TABS
325.0000 mg | ORAL_TABLET | Freq: Every day | ORAL | Status: DC
  Administered 2014-12-30: 325 mg
  Filled 2014-12-29 (×2): qty 1

## 2014-12-29 MED ORDER — BACITRACIN 500 UNIT/GM EX OINT
1.0000 "application " | TOPICAL_OINTMENT | Freq: Two times a day (BID) | CUTANEOUS | Status: DC
  Filled 2014-12-29: qty 0.9

## 2014-12-29 MED ORDER — AMIODARONE HCL 100 MG PO TABS: 100.0000 mg | ORAL_TABLET | Freq: Every day | ORAL | Status: DC

## 2014-12-29 NOTE — Discharge Summary (Signed)
Physician Discharge Summary  Craig [name] KVQ:[id number] DOB: [birth year] DOA: 12/27/2014  PCP: Ann Held, MD  Admit date: 12/27/2014 Discharge date: 12/29/2014  Time spent: >30 minutes  Recommendations for Outpatient Follow-up:  Please follow closely patient CBGs and adjust hypoglycemic regimen as needed Patient will require arrangement of neurology follow-up in the outpatient setting at the moment of discharge  Discharge Diagnoses:  Principal Problem:   Chest pain Active Problems:   Myasthenia gravis in crisis   HTN (hypertension)   Chronic atrial fibrillation   Discharge Condition: Patient will be transferred back in a stable and improved condition to inpatient rehabilitation unit for further physical therapy and treatment.  Diet recommendation: Receiving tube feedings  Filed Weights   12/27/14 0159 12/28/14 1004 12/29/14 0400  Weight: 105.455 kg (232 lb 7.8 oz) 102.422 kg (225 lb 12.8 oz) 106.006 kg (233 lb 11.2 oz)    History of present illness:  74 y.o. male who was recently admitted for myasthenia gravis crisis and had plasmapheresis was transferred to rehabilitation. Last night patient started developing chest pain which was retrosternal pressure-like lasted for 15 minutes following which he related to his left-sided shoulder. Patient also was diaphoretic but denies any associated shortness of breath. He was given Dilaudid for pain relief following which patient became transiently bradycardic with heart rate into the 40s. Patient presently is chest pain-free. Patient had a PEG tube placed yesterday for his dysphagia secondary to myasthenia gravis. Patient denies any abdominal pain nausea vomiting or diarrhea fever chills. EKG shows atrial fibrillation with nonspecific ST changes. Patient has been admitted for further management of his chest pain. Patient is on Xarelto which has been held for last 2 days for his PEG tube placement.   Hospital Course:  1-chest pain: Appears to  be secondary to free air after insertion of PEG tube plus GERD -Troponins negative 3 and no abnormalities seen on EKG or telemetry -Patient is currently pain free and is tolerating tube feedings without any difficulties -No shortness of breath.  -has been started on famotidine 40 mg daily (no PPIs secondary to recent C. difficile infection) -Of note, patient has a CTA that was negative for pulmonary embolism and/or acute infiltrates  2-chronic atrial fibrillation: Rate controlled. -Continue amiodarone, metoprolol and xarelto  3-GERD: will treat with famotidine; no PPIs secondary to recent C. difficile infection  4-severe protein calorie malnutrition: Patient with PEG tube insertion and is currently receiving tube feedings -Follow dietitian recommendations calorie intake and free water  5-myasthenia gravis: Continue Mestinon and prednisone -Outpatient follow-up with neurology to be arranged at the moment of discharge  6-recent C. difficile infection: No further diarrhea appreciated. Patient completed 21 days of antibiotics with Flagyl. -Avoid the use of PPIs.  7-hyperlipidemia: Continue statins.  8-history of diabetes: A1c ordered -CBGs elevated due to tube feedings -Will continue starting kale insulin and tube feedings coverage with rapid insulin  9-obstructive sleep apnea: Continue Cipro at night  Procedures:  See below for x-ray report  Consultations:  None  Discharge Exam: Filed Vitals:   12/29/14 0400  BP: 120/73  Pulse: 73  Temp: 97.7 F (36.5 C)  Resp:     General: Afebrile, denies any further chest pain; no nausea or vomiting. Tolerating tube feedings without any problems  Cardiovascular: Rate controlled; no rubs or gallops; no JVD appreciated on exam  Respiratory: Good air movement bilaterally; no wheezing, no crackles. Patient with nasal cannula in place (2L)  Abdomen: PEG tube in place, good bowel sounds, no distention,  no guarding; soft abdomen on  examination.  Musculoskeletal: No cyanosis or clubbing  Discharge Instructions   Discharge Instructions    Diet - low sodium heart healthy    Complete by:  As directed      Discharge instructions    Complete by:  As directed   Please continue adjusting insulin as needed for better control of patient blood sugar now that he is receiving tube feedings Physical therapy as per inpatient rehabilitation protocol Take medications as prescribed          Current Discharge Medication List    START taking these medications   Details  acetaminophen (TYLENOL) 325 MG tablet Take 2 tablets (650 mg total) by mouth every 6 (six) hours as needed for mild pain (or Fever >/= 101).    aspirin 325 MG tablet Take 1 tablet (325 mg total) by mouth daily.    famotidine (PEPCID) 40 MG tablet Take 1 tablet (40 mg total) by mouth daily.    ipratropium (ATROVENT) 0.02 % nebulizer solution Inhale 2.5 mLs (0.5 mg total) into the lungs every 6 (six) hours as needed (wHEEZING.). Qty: 75 mL, Refills: 12    morphine 2 MG/ML injection Inject 0.5-1 mLs (1-2 mg total) into the vein every 4 (four) hours as needed. Qty: 1 mL, Refills: 0    !! Nutritional Supplements (FEEDING SUPPLEMENT, JEVITY 1.2 CAL,) LIQD Place 1,000 mLs into feeding tube continuous. Refills: 0    ondansetron (ZOFRAN) 4 MG tablet Take 1 tablet (4 mg total) by mouth every 6 (six) hours as needed for nausea. Qty: 20 tablet, Refills: 0    potassium chloride (K-DUR) 10 MEQ tablet Take 2 tablets (20 mEq total) by mouth daily.     !! - Potential duplicate medications found. Please discuss with provider.    CONTINUE these medications which have CHANGED   Details  !! amiodarone (PACERONE) 100 MG tablet Place 1 tablet (100 mg total) into feeding tube daily.    !! artificial tears (LACRILUBE) OINT ophthalmic ointment Place into both eyes every 4 (four) hours as needed for dry eyes. Refills: 0    !! atorvastatin (LIPITOR) 40 MG tablet Place 1  tablet (40 mg total) into feeding tube daily.    !! chlorhexidine (PERIDEX) 0.12 % solution 15 mLs by Mouth Rinse route 2 (two) times daily. Qty: 120 mL, Refills: 0    !! insulin aspart (NOVOLOG) 100 UNIT/ML injection Inject 0-15 Units into the skin every 4 (four) hours. Qty: 10 mL, Refills: 11    !! insulin aspart (NOVOLOG) 100 UNIT/ML injection Inject 4 Units into the skin every 6 (six) hours. Qty: 10 mL, Refills: 11    !! metoprolol tartrate (LOPRESSOR) 25 MG tablet Place 0.5 tablets (12.5 mg total) into feeding tube daily.    !! predniSONE (DELTASONE) 20 MG tablet Place 3 tablets (60 mg total) into feeding tube daily with breakfast.    !! pyridostigmine (MESTINON) 60 MG tablet 1.5 tablets (90 mg total) by Per NG tube route 4 (four) times daily.    !! rivaroxaban (XARELTO) 20 MG TABS tablet Take 1 tablet (20 mg total) by mouth daily with supper.     !! - Potential duplicate medications found. Please discuss with provider.    CONTINUE these medications which have NOT CHANGED   Details  !! amiodarone (PACERONE) 100 MG tablet Place 1 tablet (100 mg total) into feeding tube daily.    antiseptic oral rinse (CPC / CETYLPYRIDINIUM CHLORIDE 0.05%) 0.05 % LIQD solution 7  mLs by Mouth Rinse route QID. Refills: 0    !! artificial tears (LACRILUBE) OINT ophthalmic ointment Place into both eyes every 4 (four) hours as needed for dry eyes. Refills: 0    !! atorvastatin (LIPITOR) 40 MG tablet Place 1 tablet (40 mg total) into feeding tube daily.    !! chlorhexidine (PERIDEX) 0.12 % solution 15 mLs by Mouth Rinse route 2 (two) times daily. Qty: 120 mL, Refills: 0    ipratropium (ATROVENT HFA) 17 MCG/ACT inhaler Inhale 1 puff into the lungs every 6 (six) hours as needed for wheezing.    !! metoprolol tartrate (LOPRESSOR) 25 MG tablet Place 0.5 tablets (12.5 mg total) into feeding tube daily.    !! Nutritional Supplements (FEEDING SUPPLEMENT, VITAL AF 1.2 CAL,) LIQD Place 1,000 mLs into  feeding tube continuous.    !! predniSONE (DELTASONE) 20 MG tablet Place 3 tablets (60 mg total) into feeding tube daily with breakfast.    !! pyridostigmine (MESTINON) 60 MG tablet 1.5 tablets (90 mg total) by Per NG tube route 4 (four) times daily.    !! rivaroxaban (XARELTO) 20 MG TABS tablet Take 20 mg by mouth daily with supper.    Water For Irrigation, Sterile (FREE WATER) SOLN Place 200 mLs into feeding tube every 4 (four) hours.     !! - Potential duplicate medications found. Please discuss with provider.    STOP taking these medications     metroNIDAZOLE (FLAGYL) 500 MG tablet        No Known Allergies   The results of significant diagnostics from this hospitalization (including imaging, microbiology, ancillary and laboratory) are listed below for reference.    Significant Diagnostic Studies: Ct Abdomen Wo Contrast  04-Jan-2015   CLINICAL DATA:  Initial encounter for evaluate anatomy secondary to G-tube placement. 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 Dysphagia.  EXAM: CT ABDOMEN WITHOUT CONTRAST  TECHNIQUE: Multidetector CT imaging of the abdomen was performed following the standard protocol without IV contrast.  COMPARISON:  12/02/2014  FINDINGS: Lower chest: Dependent left base atelectasis. Trace left pleural fluid or thickening, new. Mild cardiomegaly with coronary artery atherosclerosis.  Hepatobiliary: Old granulomatous disease in the liver. Contracted gallbladder, without biliary ductal dilatation.  Pancreas: Fatty replacement involving the pancreatic head and uncinate process. No duct dilatation or acute pancreatitis.  Spleen: Normal  Adrenals/Urinary Tract: Normal adrenal glands. No renal calculi or hydronephrosis. No abdominal hydroureter or ureteric stone.  Stomach/Bowel: Nasogastric tube terminates at the body of the stomach. The gastric body is positioned anteriorly, without interposition of colon or small bowel. There is no omental nodularity in  the region of the greater curvature of the stomach. No abnormal gastric wall thickening.  Extensive colonic diverticulosis. Contrast identified throughout the colon. Small bowel loops and appendix are not well opacified. Small bowel is normal in caliber.  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 1.9 cm on image 34 versus similar (when remeasured). Aortic and branch vessel atherosclerosis. No retroperitoneal or retrocrural adenopathy.  Other: No ascites. Omental nodularity is again identified. An 11 mm nodule on image 54 likely represents enlargement of a 3 mm nodule on image 51 of the prior exam. Right-sided abdominal omental nodule measures 1.5 cm on image 50, enlarged from 9 mm on the prior exam (when remeasured).  Musculoskeletal: Degenerative disc disease, most advanced at L4-5 and L1-2. Convex right lumbar spine curvature.  IMPRESSION: 1. Progressive omental/peritoneal metastasis. 2. No disease identified along the anterior aspect of the  stomach to preclude gastrostomy tube placement. 3. Celiac origin aneurysm, similar. 4. Left base atelectasis with adjacent trace left pleural fluid or thickening, new.   Electronically Signed   By: Abigail Miyamoto M.D.   On: 12/25/2014 16:34   Ct Angio Chest Pe W/cm &/or Wo Cm  12/27/2014   CLINICAL DATA:  Patient complaining of chest pain and left shoulder pain. Diaphoretic.  EXAM: CT ANGIOGRAPHY CHEST WITH CONTRAST  TECHNIQUE: Multidetector CT imaging of the chest was performed using the standard protocol during bolus administration of intravenous contrast. Multiplanar CT image reconstructions and MIPs were obtained to evaluate the vascular anatomy.  CONTRAST:  163mL OMNIPAQUE IOHEXOL 350 MG/ML SOLN  COMPARISON:  Chest radiograph 12/27/2014; abdomen pelvic CT 12/25/2014  FINDINGS: Adequate opacification of the pulmonary arterial system. Evaluation of the subsegmental pulmonary arteries limited due to motion artifact. Within the above  limitation, no definite evidence for pulmonary embolism.  Left anterior chest wall Port-A-Cath is present with tip terminating in the superior vena cava. Right-sided central venous catheter is present with tip terminating at the superior cavoatrial junction. Heart is enlarged. No pericardial effusion. Coronary arterial vascular calcifications. No axillary, mediastinal or hilar lymphadenopathy. Small hiatal hernia.  The central airways are patent. There is dependent atelectasis within the bilateral lower lobes. No definite pleural effusion or pneumothorax.  Visualization of the upper abdomen demonstrates interval placement of gastrostomy tube. Tube appears in appropriate position. There is a small amount of free intraperitoneal air adjacent to the anterior aspect of the stomach, likely postprocedural in etiology. Small gallstone within the gallbladder lumen. Unchanged bilateral adrenal thickening. Re- demonstrated omental nodularity. 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  Degenerative changes of the thoracic spine without aggressive or acute appearing osseous lesions.  Review of the MIP images confirms the above findings.  IMPRESSION: No evidence for pulmonary embolism. Limited evaluation of the subsegmental pulmonary arteries due to motion artifact.  Interval placement of gastrostomy tube. Small amount of free intraperitoneal air adjacent to the anterior margin the stomach, likely postprocedural in etiology.  Re- demonstrated omental nodularity, compatible with metastatic disease.  Celiac origin aneurysm, stable.  Bilateral lower lobe atelectatic changes.  Critical Value/emergent results were called by telephone at the time of interpretation on 12/27/2014 at 8:00 am to Nurse [facility] , who verbally acknowledged these results and will page Dr. Dyann Kief.   Electronically Signed   By: Lovey Newcomer M.D.   On: 12/27/2014 08:03   Ir Gastrostomy Tube Mod Sed  12/26/2014   CLINICAL DATA:  Myasthenia  gravis, dysphasia, needs enteral feeding support.  EXAM: PERC PLACEMENT GASTROSTOMY  FLUOROSCOPY TIME:  1 minutes 48 seconds  TECHNIQUE: The procedure, risks, benefits, and alternatives were explained to the patient. Questions regarding the procedure were encouraged and answered. The patient understands and consents to the procedure. As antibiotic prophylaxis, cefazolin was ordered pre-procedure and administered intravenously within one hour of incision.Progression of previously administered oral barium into the colon was confirmed fluoroscopically. A 5 French angiographic catheter was placed as orogastric tube. The upper abdomen was prepped with Betadine, draped in usual sterile fashion, and infiltrated locally with 1% lidocaine.  Intravenous Fentanyl and Versed were administered as conscious sedation during continuous cardiorespiratory monitoring by the radiology RN, with a total moderate sedation time of ten minutes.  Stomach was insufflated using air through the orogastric tube. An 24 French sheath needle was advanced percutaneously into the gastric lumen under fluoroscopy. Gas could be aspirated and a small contrast injection confirmed intraluminal  spread. The sheath was exchanged over a guidewire for a 9 [place] vascular sheath, through which the snare device was advanced and used to snare a guidewire passed through the orogastric tube. This was withdrawn, and the snare attached to the 20 French pull-through gastrostomy tube, which was advanced antegrade, positioned with the internal bumper securing the anterior gastric wall to the anterior abdominal wall. Small contrast injection confirms appropriate positioning. The external bumper was applied and the catheter was flushed.  COMPLICATIONS: COMPLICATIONS none  IMPRESSION: 1. Technically successful 20 French pull-through gastrostomy placement under fluoroscopy.   Electronically Signed   By: Lucrezia Europe M.D.   On: 12/26/2014 11:26   Dg Chest Port 1  View  12/28/2014   CLINICAL DATA:  Cough and shortness of breath today.  EXAM: PORTABLE CHEST - 1 VIEW  COMPARISON:  View of the chest CT chest 12/27/2014.  FINDINGS: Right IJ catheter and left subclavian approach Port-A-Cath are again seen. Lung volumes are lower than on comparison plain film of the chest. The lungs appear clear. There is cardiomegaly. No pneumothorax or pleural effusion.  IMPRESSION: Cardiomegaly without acute disease.   Electronically Signed   By: Inge Rise M.D.   On: 12/28/2014 22:37   Dg Chest Port 1 View  12/27/2014   CLINICAL DATA:  Acute onset of chest pain and shortness of breath. Initial encounter.  EXAM: PORTABLE CHEST - 1 VIEW  COMPARISON:  Chest radiograph performed 12/11/2014  FINDINGS: The lungs are hypoexpanded. Minimal left basilar opacity likely reflects atelectasis. There is no evidence of pleural effusion or pneumothorax.  The cardiomediastinal silhouette is borderline enlarged. A right IJ dual lumen catheter seen ending about the mid SVC. A left-sided chest port is noted ending about the distal SVC. No acute osseous abnormalities are seen.  IMPRESSION: Lungs hypoexpanded. Minimal left basilar opacity likely reflects atelectasis; lungs otherwise grossly clear.   Electronically Signed   By: Garald Balding M.D.   On: 12/27/2014 02:55   Dg Chest Port 1 View  12/11/2014   CLINICAL DATA:  Follow-up of respiratory failure, history of CHF, myasthenia gravis, and bladder malignancy  EXAM: PORTABLE CHEST - 1 VIEW  COMPARISON:  Portable chest x-ray of December 09, 2014  FINDINGS: Significant change in positioning is noted on today's study. There is minimal increased density above the dome of the right hemidiaphragm consistent with atelectasis. On the left the retrocardiac region is dense. The left lateral costophrenic angle is excluded from the study. The heart is top-normal in size. The pulmonary vascularity is not engorged. The right internal jugular venous catheter tip  projects over the proximal third of the SVC. The Port-A-Cath appliance tip projects over the middle third of the SVC. The esophagogastric tube tip projects below the inferior margin of the image.  IMPRESSION: Bibasilar atelectasis has developed. There is no evidence of CHF. No large pleural effusion is demonstrated. The support tubes and lines are in appropriate position.   Electronically Signed   By: David  [place]   On: 12/11/2014 08:01   Dg Chest Port 1 View  12/09/2014   CLINICAL DATA:  Respiratory failure.  EXAM: PORTABLE CHEST - 1 VIEW  COMPARISON:  December 08, 2014.  FINDINGS: Stable cardiomediastinal silhouette. Endotracheal tube has been removed. Nasogastric tube is seen entering stomach. Stable left subclavian Port-A-Cath is noted with distal tip at expected position of cavoatrial junction. Stable right internal jugular catheter is noted with tip in expected position of the SVC. No pneumothorax or significant pleural effusion  is noted. Minimal bibasilar subsegmental atelectasis is noted. Bony thorax is intact.  IMPRESSION: Minimal bibasilar subsegmental atelectasis. Endotracheal tube has been removed. Otherwise stable support apparatus.   Electronically Signed   By: Sabino Dick M.D.   On: 12/09/2014 08:03   Dg Chest Port 1 View  12/08/2014   CLINICAL DATA:  Respiratory failure.  Hypoxia.  EXAM: PORTABLE CHEST - 1 VIEW  COMPARISON:  12/07/2014.  FINDINGS: Endotracheal tube has been withdrawn, its tip is 2.8 cm above the carina. Right IJ line, power port catheter in stable position. Bibasilar subsegmental atelectasis and/or infiltrates again noted these findings have progressed. Small left pleural effusion. No pneumothorax. No acute osseous abnormality.  IMPRESSION: 1. Endotracheal tube has been withdrawn, its tip is 2.8 cm above the carina. Right IJ line and power port catheter stable position. 2. Interim progression of bibasilar subsegmental atelectasis and/or infiltrates. Small left pleural effusion  cannot be excluded.   Electronically Signed   By: Marcello Moores  Register   On: 12/08/2014 07:49   Dg Chest Port 1 View  12/07/2014   CLINICAL DATA:  Acute respiratory failure with hypoxia  EXAM: PORTABLE CHEST - 1 VIEW  COMPARISON:  12/06/2014  FINDINGS: Endotracheal tube with tip at the carina or right mainstem bronchus orifice. Left subclavian porta catheter and right IJ catheter are in stable position. Gastric suction tube continues into the stomach.  Worsening retrocardiac aeration, likely atelectasis. No edema, effusion, or pneumothorax. Heart size and mediastinal contours are stable.  These results were called by telephone at the time of interpretation on 12/07/2014 at 6:21 am to [place] , who verbally acknowledged these results.  IMPRESSION: 1. Endotracheal tube tip at the carina or proximal right mainstem. Recommend retraction by 3 cm. 2. New retrocardiac atelectasis, possibly exacerbated by #1.   Electronically Signed   By: Jorje Guild M.D.   On: 12/07/2014 06:21   Dg Chest Port 1 View  12/06/2014   CLINICAL DATA:  Acute respiratory failure  EXAM: PORTABLE CHEST - 1 VIEW  COMPARISON:  12/05/2014  FINDINGS: Mildly low endotracheal tube, tip 1.5 cm above the carina. Left subclavian porta catheter and right IJ central line are in unchanged position. A gastric suction tube reaches the stomach at least.  Unchanged hypoaeration with bibasilar atelectasis, more extensive on the left. Cannot exclude a trace left pleural effusion. No pneumothorax.  IMPRESSION: 1. Lower endotracheal tube, tip 1.5 cm above the carina. 2. Unchanged bibasilar atelectasis.   Electronically Signed   By: Jorje Guild M.D.   On: 12/06/2014 06:21   Dg Chest Port 1 View  12/05/2014   CLINICAL DATA:  Central line placement.  Initial encounter.  EXAM: PORTABLE CHEST - 1 VIEW  COMPARISON:  Chest radiograph performed earlier today at 11:20 a.m.  FINDINGS: The patient's endotracheal tube is seen ending 2 cm above the  carina. A left-sided chest port is noted ending about the distal SVC. An enteric tube is noted extending overlying the body of the stomach. A right IJ sheath is seen ending overlying the mid SVC.  Lung expansion is mildly improved. Mild left basilar opacity may reflect atelectasis or possibly mild pneumonia. No pleural effusion or pneumothorax is seen.  The cardiomediastinal silhouette is mildly enlarged. No acute osseous abnormalities are identified.  IMPRESSION: 1. Right IJ sheath seen ending about the mid SVC. 2. Endotracheal tube noted ending 2 cm above the carina. 3. Enteric tube seen extending overlying the body of the stomach. 4. Lungs mildly hypoexpanded. Mild  left basilar airspace opacity may reflect atelectasis or possibly mild pneumonia, slightly better characterized than on the prior study. 5. Mild cardiomegaly.   Electronically Signed   By: Garald Balding M.D.   On: 12/05/2014 20:43   Dg Swallowing Func-speech Pathology  12/25/2014    Objective Swallowing Evaluation:    Patient Details  Name: Craig [name] MRN: [id number] Date of Birth: 1941/02/05  Today's Date: 12/25/2014 Time: SLP Start Time: 0930-SLP Stop Time: 1000 SLP Time Calculation (min) (ACUTE ONLY): 30 min  Past Medical History:  Past Medical History  Diagnosis Date  . Atrial fibrillation, chronic     on Xarelto   . HTN (hypertension)   . CHF (congestive heart failure)   . Diabetes mellitus   . 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 Abdominal wall metastasis removal     HPI:  HPI: [name], never smoker, transferred to [facility] on 1/22 with acute MG  exacerbation & respiratory failure. Decompensated requiring intubation  1/22-1/25.  Now NPO with NG tube due to continued overt s/s of aspiration  during PO trials .  MBS ordered today to objectively determine readiness  for initiation of PO diet.      Assessment / Plan /  Recommendation CHL IP CLINICAL IMPRESSIONS 12/25/2014  Dysphagia Diagnosis Severe pharyngeal phase dysphagia;Moderate oral phase  dysphagia  Clinical impression Pt presents with a moderately severe oropharyngeal  dysphagia with both sensory and motor components.  Oral phase impairments  are characterized by generalized weakness, most pronounced in lingual  strength and range of motion.  This resulted in weak manipulation of  boluses with reduced posterior propulsion of materials to the oropharynx.   Pt also presents with decreased base of tongue retraction, decreased,  hyolaryngeal excursion, and reduced pharyngeal constriction to move  materials through the pharynx and achieve adequate airway protection.  The  abovementioned deficits resulted in residue at the base of tongue,  posterior pharyngeal wall, vallecula and the pyriforms, as well as delayed  swallow initiation with swallow response triggered most consistently at  the pyriforms.  Delayed swallow initiation resulted in aspiration of honey  thick, nectar thick, and thin liquids which pt was unable to effectively  clear via reflexive or volitional coughs.  Cuing for extra effortful  swallows was largely ineffective for clearing residue from the pharynx.   Pt also demonstrated deep penetration to the level of the cords with  pureed solid consistencies and increased pyriform residue which increases  his risk of aspiration post swallow.  Given the abovementioned deficits,  pt's significantly weakened state, and his anxiety regarding PO intake,  recommend that pt remain NPO and that medical team consider PEG placement.   PA made aware.  Prognosis for advancement good with continued trials of  ice chips and time for recovery.  Pharyngeal strengthening exercises are  most likely contraindicated due to the progressive nature of pt's  weakness.         [facility] IP TREATMENT RECOMMENDATION 12/12/2014  Treatment Plan Recommendations See plan of care      [facility] IP DIET  RECOMMENDATION 12/25/2014  Diet Recommendations Alternative means - long-term;NPO  Liquid Administration via (None)  Medication Administration Via alternative means  Compensations (None)  Postural Changes and/or Swallow Maneuvers (None)     [facility] IP OTHER RECOMMENDATIONS 12/25/2014  Recommended Consults (None)  Oral Care Recommendations Oral care  Q4 per protocol  Other Recommendations Have oral suction available               [facility] IP REASON FOR REFERRAL 12/25/2014  Reason for Referral Objectively evaluate swallowing function     [facility] IP ORAL PHASE 12/25/2014  Oral Phase  Impaired                          Oral - Honey Teaspoon Weak lingual manipulation;Lingual pumping;Incomplete  tongue to palate contact;Reduced posterior propulsion;Piecemeal  swallowing;Delayed oral transit;Lingual/palatal residue        Oral - Nectar Teaspoon Lingual pumping;Incomplete tongue to palate  contact;Reduced posterior propulsion;Weak lingual manipulation;Piecemeal  swallowing;Delayed oral transit;Lingual/palatal residue              Oral - Thin Teaspoon Lingual pumping;Incomplete tongue to palate  contact;Reduced posterior propulsion;Weak lingual manipulation;Piecemeal  swallowing;Delayed oral transit;Lingual/palatal residue           Oral - Puree Lingual pumping;Incomplete tongue to palate contact;Reduced  posterior propulsion;Weak lingual manipulation;Piecemeal  swallowing;Delayed oral transit;Lingual/palatal residue                     [facility] IP PHARYNGEAL PHASE 12/25/2014  Pharyngeal Phase Impaired              Pharyngeal - Honey Teaspoon Delayed swallow initiation;Premature spillage  to pyriform sinuses;Reduced laryngeal elevation;Reduced tongue base  retraction;Reduced epiglottic inversion;Reduced anterior laryngeal  mobility;Reduced airway/laryngeal closure;Reduced pharyngeal  peristalsis;Penetration/Aspiration during swallow;Pharyngeal residue -  pyriform sinuses;Pharyngeal residue - posterior pharnyx  Penetration/Aspiration details (honey  teaspoon) Material enters airway,  passes BELOW cords and not ejected out despite cough attempt by patient              Pharyngeal - Nectar Teaspoon Delayed swallow initiation;Premature spillage  to pyriform sinuses;Reduced epiglottic inversion;Reduced laryngeal  elevation;Reduced tongue base retraction;Penetration/Aspiration during  swallow;Pharyngeal residue - pyriform sinuses;Pharyngeal residue -  posterior pharnyx;Reduced airway/laryngeal closure;Reduced anterior  laryngeal mobility;Reduced pharyngeal peristalsis  Penetration/Aspiration details (nectar teaspoon) Material enters airway,  passes BELOW cords and not ejected out despite cough attempt by patient                          Pharyngeal - Thin Teaspoon Reduced epiglottic inversion;Reduced laryngeal  elevation;Premature spillage to pyriform sinuses;Delayed swallow  initiation;Reduced tongue base retraction;Reduced airway/laryngeal  closure;Reduced anterior laryngeal mobility;Penetration/Aspiration during  swallow;Pharyngeal residue - pyriform sinuses  Penetration/Aspiration details (thin teaspoon) Material enters airway,  passes BELOW cords and not ejected out despite cough attempt by patient                    Pharyngeal - Puree Delayed swallow initiation;Premature spillage to  valleculae;Reduced tongue base retraction;Reduced laryngeal  elevation;Reduced epiglottic inversion;Pharyngeal residue -  valleculae;Pharyngeal residue - pyriform sinuses;Pharyngeal residue -  posterior pharnyx;Penetration/Aspiration during swallow  Penetration/Aspiration details (puree) Material enters airway, CONTACTS  cords and not ejected out                                        Emilio Math 12/25/2014, 10:30 AM     Microbiology: Recent Results (from the past 240 hour(s))  MRSA PCR Screening     Status: None   Collection Time: 12/27/14  3:20 AM  Result Value Ref Range Status   MRSA by PCR NEGATIVE NEGATIVE Final  Comment:        The GeneXpert MRSA Assay  (FDA approved for NASAL specimens only), is one component of a comprehensive MRSA colonization surveillance program. It is not intended to diagnose MRSA infection nor to guide or monitor treatment for MRSA infections.      Labs: Basic Metabolic Panel:  Recent Labs Lab 12/25/14 0600 12/27/14 0300 12/27/14 0800 12/29/14 0350  NA 141 139 138 140  K 3.7 4.1 3.6 3.1*  CL 104 103 99 99  CO2 32 32 31 33*  GLUCOSE 130* 170* 123* 197*  BUN 11 7 9 12   CREATININE 0.71 0.67 0.60 0.68  CALCIUM 8.7 8.5 8.3* 8.3*   Liver Function Tests:  Recent Labs Lab 12/27/14 0300  AST 35  ALT 54*  ALKPHOS 46  BILITOT 0.9  PROT 5.2*  ALBUMIN 3.0*   CBC:  Recent Labs Lab 12/26/14 0525 12/27/14 0300 12/27/14 0800 12/29/14 0350  WBC 10.1 11.1* 11.3* 15.8*  NEUTROABS  --  10.1*  --   --   HGB 12.5* 13.1 13.3 13.6  HCT 37.3* 39.3 39.8 40.4  MCV 92.8 94.7 93.2 93.1  PLT 226 202 213 201   Cardiac Enzymes:  Recent Labs Lab 12/26/14 2305 12/27/14 0300 12/27/14 0800  CKTOTAL 41  --   --   CKMB 1.6  --   --   TROPONINI <0.03 <0.03 <0.03   BNP: BNP (last 3 results)  Recent Labs  12/28/14 2215  BNP 250.1*   CBG:  Recent Labs Lab 12/28/14 2001 12/28/14 2359 12/29/14 0346 12/29/14 0816 12/29/14 1208  GLUCAP 176* 210* 203* 199* 253*    Signed:  Barton Dubois  [facility] 12/29/2014, 4:25 PM

## 2014-12-29 NOTE — Progress Notes (Signed)
Cleatrice Burke, RN Rehab Admission Coordinator Signed Physical Medicine and Rehabilitation PMR Pre-admission 12/29/2014 12:52 PM  Related encounter: Admission (Current) from 12/27/2014 in [facility] Collapse All   PMR Admission Coordinator Pre-Admission Assessment  Patient: Craig [name] is an 74 y.o., male MRN: [id number] DOB: 09-16-1941 Height: 5' 7"  (170.2 cm) Weight: 106.006 kg (233 lb 11.2 oz)  Insurance Information HMO: PPO: yes PCP: IPA: 80/20: OTHER: Medicare advantage plan PRIMARY: [employer] Policy#: [id number] Subscriber: pt CM Name: Enrique Sack Phone#: [phone] Fax#: [phone] Pre-Cert#: [id number] approved for 7 days. Enrique Sack has EPIC access to follow Employer: retired Benefits: Phone #: [phone] Name: 12/11/14 Eff. Date: 11/14/14 Deduct: none Out of Pocket Max: $3100 Life Max: none CIR: $225 copay per admit then $75 per day days 2 through 5 SNF: no copay days 1-20; $140 copay per day days 21-100 Outpatient: $10 per visit copay Co-Pay: no visit limit Home Health: $10 per visit copay Co-Pay: no visit limit DME: 80% Co-Pay: 20% Providers: in network  SECONDARY: none   Medicaid Application Date: Case Manager:  Disability Application Date: Case Worker:   Emergency Facilities manager Information    Name Relation Home Work Mobile   Heuerman,Martha Spouse [phone]     [place], [place]   [phone]     Current Medical History  Patient Admitting Diagnosis: Myasthenia Gravis with crisis  History of Present Illness: Craig [name] is a 74 y.o. male with history of A fib, metastatic bladder cancer, recent PNA, MG who has had worsening over past  months. He was evaluated by neurology 3 days PTA and was placed on steroids with increase in mestinon due to difficulty swallowing and SOB. Patient continued to worsen and was admitted via [place] on 12/05/14 with SOB, swallowing difficulty, difficulty hold head up with droopy eyelids and congestion. s. He was intubated due to respiratory distress and started on IV solumedrol as well as plasmapheresis per input by Dr. Doy Mince. He has responded to treatment and tolerated extubation on 01/25 with use of BIPAP prn. He is able to suction oral secretions and is currently NPO with tube feeds for nutritional support. He was treated with PLEX X 6 days with improvement in SOB. He was transferred to [facility] 12/17/14 and has been making good progress with in therapy with improvement in activity tolerance, decrease secretions as well as improvement in oxygenation. Repeat MBS showed poor recovery of swallow function therefore PEG was placed by Dr. Vernard Gambles on 12/26/14. Later that evening patient developed mid sternal chest pain radiating to left shouder and cardiac enzymes as well as EKG was done for workup. CTA chest was negative for PTX or PE and small amount of air seen anterior due to procedure.     Past Medical History  Past Medical History  Diagnosis Date  . Atrial fibrillation, chronic     on Xarelto   . HTN (hypertension)   . CHF (congestive heart failure)   . Diabetes mellitus   . 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  Prior Rehab/Hospitalizations: [facility] 12/17/14 through 12/27/14  Current Medications   Current facility-administered medications:  . acetaminophen (TYLENOL) tablet 650 mg, 650 mg, Oral, Q6H PRN, 650 mg at 12/29/14 1036 **OR** acetaminophen (TYLENOL) suppository 650 mg, 650 mg, Rectal, Q6H PRN, Rise Patience, MD . amiodarone (PACERONE) tablet 100 mg, 100 mg,  Per Tube, Daily, Rise Patience, MD, 100 mg at 12/29/14 1036 . artificial tears (LACRILUBE) ophthalmic ointment, , Both Eyes, Q4H PRN, Rise Patience, MD . aspirin tablet 325 mg, 325 mg, Oral, Daily, Barton Dubois, MD, 325 mg at 12/29/14 1036 . atorvastatin (LIPITOR) tablet 40 mg, 40 mg, Per Tube, Daily, Rise Patience, MD, 40 mg at 12/29/14 1036 . chlorhexidine (PERIDEX) 0.12 % solution 15 mL, 15 mL, Mouth Rinse, BID, Rise Patience, MD, 15 mL at 12/29/14 0853 . famotidine (PEPCID) tablet 40 mg, 40 mg, Oral, Daily, Barton Dubois, MD, 40 mg at 12/29/14 1036 . feeding supplement (JEVITY 1.2 CAL) liquid 1,000 mL, 1,000 mL, Per Tube, Continuous, Barton Dubois, MD, Last Rate: 65 mL/hr at 12/29/14 0359, 1,000 mL at 12/29/14 0359 . free water 200 mL, 200 mL, Per Tube, 6 times per day, Rise Patience, MD, 200 mL at 12/29/14 1246 . insulin aspart (novoLOG) injection 0-15 Units, 0-15 Units, Subcutaneous, 6 times per day, Ritta Slot, NP, 8 Units at 12/29/14 1243 . ipratropium (ATROVENT) nebulizer solution 0.5 mg, 0.5 mg, Inhalation, Q6H PRN, Rise Patience, MD, 0.5 mg at 12/28/14 2059 . metoprolol tartrate (LOPRESSOR) tablet 12.5 mg, 12.5 mg, Per Tube, Daily, Rise Patience, MD, 12.5 mg at 12/29/14 1036 . morphine 2 MG/ML injection 1-2 mg, 1-2 mg, Intravenous, Q4H PRN, Barton Dubois, MD, 2 mg at 12/29/14 0853 . ondansetron (ZOFRAN) tablet 4 mg, 4 mg, Oral, Q6H PRN **OR** ondansetron (ZOFRAN) injection 4 mg, 4 mg, Intravenous, Q6H PRN, Rise Patience, MD . predniSONE (DELTASONE) tablet 60 mg, 60 mg, Per Tube, Q breakfast, Rise Patience, MD, 60 mg at 12/29/14 0848 . pyridostigmine (MESTINON) tablet 90 mg, 90 mg, Per NG tube, QID, Rise Patience, MD, 90 mg at 12/29/14 1035 . rivaroxaban (XARELTO) tablet 20 mg, 20 mg, Oral, Q supper, Rise Patience, MD, 20 mg at 12/28/14 1706 . sodium chloride 0.9 % injection 10-40 mL, 10-40 mL,  Intracatheter, Q12H, Rise Patience, MD, 10 mL at 12/29/14 1037 . sodium chloride 0.9 % injection 10-40 mL, 10-40 mL, Intracatheter, PRN, Rise Patience, MD, 10 mL at 12/28/14 1133 . sodium chloride 0.9 % injection 3 mL, 3 mL, Intravenous, Q12H, Rise Patience, MD, 3 mL at 12/29/14 1037  Patients Current Diet: NPO with jevity feeds vai PEG at 65 cc/hr  Precautions / Restrictions Precautions Precautions: Fall, Other (comment) Precaution Comments: O2 via [place]; peg Restrictions Weight Bearing Restrictions: No Other Position/Activity Restrictions: HOB elevated due to continuous tube feeds.   Prior Activity Level Community (5-7x/wk): actvie, driving, doing self care   Home Equities trader / [place] Devices/Equipment: CBG Meter, Cane (specify quad or straight), BIPAP Home Equipment: Walker - 2 wheels, Cane - single point, Bedside commode  Prior Functional Level Prior Function Level of Independence: Independent with assistive device(s) Comments: Enjoys doing woodworking. Used SPC PTA on days he "wasn't feeling great"  Current Functional Level Cognition  Arousal/Alertness: Awake/alert Overall Cognitive Status: Within Functional Limits for tasks assessed Orientation Level: Oriented X4   Extremity Assessment (includes Sensation/Coordination)  Upper Extremity Assessment: RUE deficits/detail, LUE deficits/detail RUE Deficits / Details: - @ 2+/5; shoudler FF to 80; PROM WFL RUE Coordination: decreased gross motor LUE Deficits / Details: 3/5 shoulder; 4/5 elbow; 4+/5 hand. weak scapular muscles - @ 3/5 LUE Coordination: decreased gross motor  Lower Extremity Assessment: Defer to PT evaluation RLE Deficits / Details: hip flex 3/5, knee flex/ ext 3/5 weakness began with MG  flare, LLE WFL    ADLs  Overall ADL's : Needs assistance/impaired Eating/Feeding: NPO Eating/Feeding Details (indicate cue type and reason): peg placemetn Grooming: Minimal  assistance Grooming Details (indicate cue type and reason): Pt with posterior lean when releasing RW with BUE requiring min A to compensate for LOB Upper Body Bathing: Minimal assitance, Sitting Lower Body Bathing: Minimal assistance, Sit to/from stand Lower Body Bathing Details (indicate cue type and reason): difficulty with releasing RW and maintinaing balance with peri care Upper Body Dressing : Moderate assistance Lower Body Dressing: Moderate assistance, Sit to/from stand Toilet Transfer: Minimal assistance, Ambulation, Stand-pivot Toileting- Clothing Manipulation and Hygiene: Minimal assistance, Sit to/from stand Functional mobility during ADLs: Minimal assistance, Rolling walker, Cueing for safety General ADL Comments: core and BUE weakness and balance deficits affecting ADL status    Mobility  Overal bed mobility: Needs Assistance Bed Mobility: Supine to Sit, Sit to Supine Supine to sit: Min assist, Mod assist General bed mobility comments: pt up in chair    Transfers  Overall transfer level: Needs assistance Equipment used: Rolling walker (2 wheeled) Transfers: Sit to/from Stand Sit to Stand: Min assist General transfer comment: 2 rys to trasition from sit - stand from lower suface with min A    Ambulation / Gait / Stairs / Wheelchair Mobility  Ambulation/Gait Ambulation/Gait assistance: Museum/gallery curator (Feet): 35 Feet Assistive device: Rolling walker (2 wheeled) Gait Pattern/deviations: Step-through pattern, Decreased step length - right, Decreased step length - left, Trunk flexed Gait velocity interpretation: Below normal speed for age/gender General Gait Details: Vc's for upright posture. Pt. stated he could not hold his neck up. Noted kinesiotape in place. Pt ambulated with RW with cues for steering RW as well as cues for technique. Decr safety at times needing cues for use of [place]. Pt desats with activity needing 4LO2 to keep sats >90%. Poor  endurance at present but pt does push himself.     Posture / Balance Balance Overall balance assessment: Needs assistance, History of Falls Sitting-balance support: No upper extremity supported, Feet supported Sitting balance-Leahy Scale: Fair Standing balance support: Bilateral upper extremity supported, During functional activity Standing balance-Leahy Scale: Poor Standing balance comment: posterior lean without BUE assist High level balance activites: Direction changes, Turns, Sudden stops High Level Balance Comments: All of the above challenges with RW with min to mod assist. Unsteady with challenges.     Special needs/care consideration BiPAP/CPAP yes Continuous Drip IV no Oxygen O 2 at 2 liters [place]   Bowel mgmt:last BM 12/25/14 incontinent Bladder mgmt: foley Diabetic mgmt yes THN, [place], met with pt 12/12/14 and plans to follow up after d/c. [phone]  Wife requesting DME for d/c of oxygen concentrator at home, pill crusher, tub bench, yonker suction, and tube feeding  Previous Home Environment Living Arrangements: Spouse/significant other Lives With: Spouse Available Help at Discharge: Family, Available 24 hours/day Type of Home: House Home Layout: One level, Laundry or work area in basement Home Access: Stairs to enter Entrance Stairs-Rails: Building surveyor of Steps: 3 Bathroom Shower/Tub: Chiropodist: Standard Bathroom Accessibility: Yes How Accessible: Accessible via walker [place]: No Additional Comments: wife reports that if he goes home, they will probably need tub bench  Discharge Living Setting Plans for Discharge Living Setting: Patient's home, Lives with (comment), Other (Comment) Type of Home at Discharge: House Discharge Home Layout: One level Discharge Home Access: Stairs to enter Entrance Stairs-Rails:  Right Entrance Stairs-Number of Steps: 3 Discharge Bathroom Shower/Tub:  Tub/shower unit Discharge Bathroom Toilet: Standard Discharge Bathroom Accessibility: Yes How Accessible: Accessible via walker Does the patient have any problems obtaining your medications?: No  Social/Family/Support Systems Patient Roles: Spouse, Parent Contact Information: Luster Landsberg, wife Anticipated Caregiver: wife Anticipated Caregiver's Contact Information: see above Ability/Limitations of Caregiver: no limitations Caregiver Availability: 24/7 Discharge Plan Discussed with Primary Caregiver: Yes Is Caregiver In Agreement with Plan?: Yes Does Caregiver/Family have Issues with Lodging/Transportation while Pt is in Rehab?: No   Goals/Additional Needs Patient/Family Goal for Rehab: Mod I to supervision with PT and OT, supervision to min assist SLP Expected length of stay: ELOS 5 to 7 days Equipment Needs: New Peg Pt/Family Agrees to Admission and willing to participate: Yes Program Orientation Provided & Reviewed with Pt/Caregiver Including Roles & Responsibilities: Yes   Decrease burden of Care through IP rehab admission: n/a  Possible need for SNF placement upon discharge:no  Patient Condition: This patient's condition was discussed with Dr. Naaman Plummer on 12/29/14 at 24, in which the Rehabilitation Physician determined that the patient's condition is appropriate for intensive rehabilitative care in an inpatient rehabilitation facility readmission after his acute medical issues have been resolved.He returned to acute on 12/26/14 and now medically ready to readmit to inpt rehab 12/29/14. Will admit to inpatient rehab today.  Preadmission Screen Completed By: Cleatrice Burke, 12/29/2014 1:14 PM ______________________________________________________________________  Discussed status with Dr. Naaman Plummer on 12/29/14 at 1621 and received telephone approval for admission today.  Admission Coordinator:  Cleatrice Burke, time 9100 Date 12/29/2014          Cosigned by: Meredith Staggers, MD at 12/29/2014 4:29 PM  Revision History     Date/Time User Provider Type Action   12/29/2014 4:29 PM Meredith Staggers, MD Physician Cosign   12/29/2014 4:22 PM Cleatrice Burke, RN Rehab Admission Coordinator Sign

## 2014-12-29 NOTE — Evaluation (Signed)
Physical Therapy Evaluation Patient Details Name: Craig [name] MRN: [id number] DOB: 12-28-1940 Today's Date: 12/29/2014   History of Present Illness  Craig [name] is a 74 y.o. 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 Patient having progressive weakness over the past month becoming worse in past week. Pt with acute MG exacerbation & respiratory failure. Decompensated requiring intubation 1/22 pm, extubated 12/08/14,  on bipap PRN. Pt now with C-diff.  Pt transferred to Rehab on 12/17/14.  On 12/27/14 patient started developing chest pain which was retrosternal pressure-like lasted for 15 minutes following which he related to his left-sided shoulder. Patient also was diaphoretic but denies any associated shortness of breath. He was given Dilaudid for pain relief following which patient became transiently bradycardic with heart rate into the 40s.  Patient had a PEG tube placed 12/28/14 for his dysphagia secondary to myasthenia gravis. PT/OT reordered as pt needs reassessment to determine best d/c plan from this point.    Clinical Impression  Pt admitted with above diagnosis. Pt currently with functional limitations due to the deficits listed below (see PT Problem List). Pt will benefit from Rehab stay to complete his therapy prior to d/c home.  Pt and wife anxious for pt to return and finish the therapy so pt can maximize his function prior to d/c home.   Pt will benefit from skilled PT to increase their independence and safety with mobility to allow discharge to the venue listed below.      Follow Up Recommendations CIR    Equipment Recommendations  Other (comment) (tub bench)    Recommendations for Other Services OT consult;Rehab consult     Precautions / Restrictions Precautions Precautions:  Fall;Other (comment) Precaution Comments: O2 via [place]; NG tube Restrictions Weight Bearing Restrictions: No      Mobility  Bed Mobility Overal bed mobility: Needs Assistance Bed Mobility: Supine to Sit;Sit to Supine     Supine to sit: Min assist;Mod assist     General bed mobility comments: Pt used rail.  Needed min to mod assist without use of rail.    Transfers Overall transfer level: Needs assistance Equipment used: Rolling walker (2 wheeled) Transfers: Sit to/from Stand Sit to Stand: Min assist         General transfer comment: Needed min assist to rise from bed with pt using rocking technique.  Took 3 tries to stand and needed steadying assist intially upon standing.    Ambulation/Gait Ambulation/Gait assistance: Min assist Ambulation Distance (Feet): 35 Feet Assistive device: Rolling walker (2 wheeled) Gait Pattern/deviations: Step-through pattern;Decreased step length - right;Decreased step length - left;Trunk flexed   Gait velocity interpretation: Below normal speed for age/gender General Gait Details: Vc's for upright posture. Pt. stated he could not hold his neck up. Noted kinesiotape in place.  Pt ambulated with RW with cues for steering RW as well as cues for technique.  Decr safety at times needing cues for use of Rw.  Pt desats with activity needing 4LO2 to keep sats >90%.  Poor endurance at present but pt does push himself.    Stairs            Wheelchair Mobility    Modified Rankin (Stroke Patients Only)       Balance Overall balance assessment: Needs assistance;History of Falls Sitting-balance support: No upper extremity supported;Feet supported Sitting balance-Leahy Scale: Fair  Standing balance support: Bilateral upper extremity supported;During functional activity Standing balance-Leahy Scale: Poor Standing balance comment: Needed RW  for static standing.  Needed min assist for dynamic standing.              High level balance  activites: Direction changes;Turns;Sudden stops High Level Balance Comments: All of the above challenges with RW with min to mod assist.  Unsteady with challenges.               Pertinent Vitals/Pain Pain Assessment: No/denies pain  BP 133/81; 90-91% on RA.  Pt desat to 84% on RA with activity therefore replaced O2 at 2LO2 and needed to incr to 4LO2 to get O2 to return to 91-92% with activity.  Pt left on 2LO2 with sats >90%.       Home Living Family/patient expects to be discharged to:: Private residence Living Arrangements: Spouse/significant other Available Help at Discharge: Family;Available 24 hours/day Type of Home: House Home Access: Stairs to enter Entrance Stairs-Rails: Right Entrance Stairs-Number of Steps: 3 Home Layout: One level;Laundry or work area in [place]: Environmental consultant - 2 wheels;Cane - single point;Bedside commode Additional Comments: wife reports that if he goes home, they will probably need tub bench    Prior Function Level of Independence: Independent with assistive device(s)         Comments: Enjoys doing woodworking. Used SPC PTA on days he "wasn't feeling great"     Hand Dominance   Dominant Hand: Right    Extremity/Trunk Assessment   Upper Extremity Assessment: Defer to OT evaluation           Lower Extremity Assessment: Generalized weakness RLE Deficits / Details: hip flex 3/5, knee flex/ ext 3/5 weakness began with MG flare, LLE WFL    Cervical / Trunk Assessment: Other exceptions  Communication   Communication: Expressive difficulties (slurred speech, low volume)  Cognition Arousal/Alertness: Awake/alert Behavior During Therapy: Flat affect Overall Cognitive Status: Within Functional Limits for tasks assessed                      General Comments      Exercises General Exercises - Lower Extremity Ankle Circles/Pumps: AROM;Both;10 reps;Seated Long Arc Quad: AROM;Both;10 reps;Seated Hip Flexion/Marching:  AROM;Both;10 reps;Seated      Assessment/Plan    PT Assessment Patient needs continued PT services  PT Diagnosis Difficulty walking;Generalized weakness   PT Problem List Decreased strength;Decreased activity tolerance;Decreased balance;Decreased mobility;Decreased coordination;Decreased knowledge of use of DME;Decreased knowledge of precautions  PT Treatment Interventions DME instruction;Gait training;Stair training;Functional mobility training;Therapeutic activities;Therapeutic exercise;Balance training;Neuromuscular re-education;Patient/family education   PT Goals (Current goals can be found in the Care Plan section) Acute Rehab PT Goals Patient Stated Goal: return home PT Goal Formulation: With patient/family Time For Goal Achievement: 01/05/15 Potential to Achieve Goals: Good    Frequency Min 3X/week   Barriers to discharge        Co-evaluation               End of Session Equipment Utilized During Treatment: Gait belt;Oxygen Activity Tolerance: Patient tolerated treatment well Patient left: in chair;with call bell/phone within reach;with family/visitor present Nurse Communication: Mobility status    Functional Assessment Tool Used: clinical judgment Functional Limitation: Mobility: Walking and moving around Mobility: Walking and Moving Around Current Status (I7867): At least 20 percent but less than 40 percent impaired, limited or restricted Mobility: Walking and Moving Around Goal Status [phone]): At least 1 percent but less than 20 percent impaired,  limited or restricted    Time: 1035-1107 PT Time Calculation (min) (ACUTE ONLY): 32 min   Charges:   PT Evaluation $Initial PT Evaluation Tier I: 1 Procedure PT Treatments $Gait Training: 8-22 mins   PT G Codes:   PT G-Codes **NOT FOR INPATIENT CLASS** Functional Assessment Tool Used: clinical judgment Functional Limitation: Mobility: Walking and moving around Mobility: Walking and Moving Around Current  Status (Y1856): At least 20 percent but less than 40 percent impaired, limited or restricted Mobility: Walking and Moving Around Goal Status [phone]): At least 1 percent but less than 20 percent impaired, limited or restricted    Irwin Brakeman F 12/29/2014, 11:38 AM [facility] [phone] (pager)

## 2014-12-29 NOTE — Progress Notes (Addendum)
I have received updated PT and OT evals and I am pursuing [employer] insurance approval to readmit pt to inpt rehab today [phone]

## 2014-12-29 NOTE — Progress Notes (Signed)
I met with pt and his wife at bedside. Both are requesting to be readmitted to inpt rehab to complete their interrupted rehab stay. I have contacted [place] with [employer] insurance and she is requesting new PT and OT evals to justify a readmission to rehab today. I have discussed with Dr. Dyann Kief and will order PT and OT to see pt today asap. [phone]

## 2014-12-29 NOTE — Progress Notes (Signed)
Occupational Therapy Evaluation Patient Details Name: Craig [name] MRN: [id number] DOB: 08-12-1941 Today's Date: 12/29/2014    History of Present Illness Craig [name] is a 74 y.o. male with a past medical history of myasthenia gravis, history metastatic bladder cancer to abdominal wall, status post chemotherapy, history of atrial fibrillation chronic anticoagulation, presenting as a transfer from [facility] on 12/05/14. Patient having progressive weakness over the past month becoming worse in past week. Pt with acute MG exacerbation & respiratory failure. Decompensated requiring intubation 1/22 pm, extubated 12/08/14,  on bipap PRN. Pt now with C-diff.  Participating with CIR as of 12/17/14. Transferred back to acute 12/27/14 due to c/o chest pain and peg placement.    Clinical Impression   Pt demonstrates a decline in function at this time requiring min A with mobility and mod A with ADL @ RW level. O2 Sats 2 97 end of session on 2L O2. Pt will benefit from rehab at SNF to facilitate safe D/C home with supportive wife who can provide 24/7 S. Pt very motivated to participate with rehab and return to PLOF. Will follow acutely to maximize independence with ADL and facilitate D/C to next venue of care.     Follow Up Recommendations  CIR;Supervision/Assistance - 24 hour    Equipment Recommendations  Tub/shower bench    Recommendations for Other Services Rehab consult     Precautions / Restrictions Precautions Precautions: Fall;Other (comment) Precaution Comments: O2 via [place]; peg Restrictions Weight Bearing Restrictions: No      Mobility Bed Mobility Overal bed mobility: Needs Assistance Bed Mobility: Supine to Sit;Sit to Supine     Supine to sit: Min assist;Mod assist     General bed mobility comments: pt up in chair  Transfers Overall transfer level: Needs assistance Equipment used: Rolling walker (2 wheeled) Transfers: Sit to/from Stand Sit to Stand: Min assist          General transfer comment: 2 rys to trasition from sit - stand from lower suface with min A    Balance Overall balance assessment: Needs assistance;History of Falls Sitting-balance support: No upper extremity supported;Feet supported Sitting balance-Leahy Scale: Fair     Standing balance support: Bilateral upper extremity supported;During functional activity Standing balance-Leahy Scale: Poor Standing balance comment: posterior lean without BUE assist                         ADL Overall ADL's : Needs assistance/impaired Eating/Feeding: NPO Eating/Feeding Details (indicate cue type and reason): peg placemetn Grooming: Minimal assistance Grooming Details (indicate cue type and reason): Pt with posterior lean when releasing RW with BUE requiring min A to compensate for LOB Upper Body Bathing: Minimal assitance;Sitting   Lower Body Bathing: Minimal assistance;Sit to/from stand Lower Body Bathing Details (indicate cue type and reason): difficulty with releasing RW and maintinaing balance with peri care Upper Body Dressing : Moderate assistance   Lower Body Dressing: Moderate assistance;Sit to/from stand   Toilet Transfer: Minimal assistance;Ambulation;Stand-pivot   Toileting- Clothing Manipulation and Hygiene: Minimal assistance;Sit to/from stand       Functional mobility during ADLs: Minimal assistance;Rolling walker;Cueing for safety General ADL Comments: core and BUE  weakness and balance deficits affecting ADL status  Difficulty maintaining trunk and neck extension during ADL. ? Core weakness.     Vision Vision Assessment?: No apparent visual deficits;Yes              Pertinent Vitals/Pain Pain Assessment: No/denies pain     Hand  Dominance Right   Extremity/Trunk Assessment Upper Extremity Assessment Upper Extremity Assessment: RUE deficits/detail;LUE deficits/detail RUE Deficits / Details:  - @ 2+/5; shoudler FF to 80; PROM WFL RUE Coordination:  decreased gross motor LUE Deficits / Details: 3/5 shoulder; 4/5 elbow; 4+/5 hand. weak scapular muscles - @ 3/5 LUE Coordination: decreased gross motor   Lower Extremity Assessment Lower Extremity Assessment: Defer to PT evaluation RLE Deficits / Details: hip flex 3/5, knee flex/ ext 3/5 weakness began with MG flare, LLE WFL   Cervical / Trunk Assessment Cervical / Trunk Assessment: Other exceptions;Kyphotic Cervical / Trunk Exceptions: forward head; weak core overall (has kinesiotape in place upper neck and back.)   Communication Communication Communication: Expressive difficulties (slurred speech, low volume)   Cognition Arousal/Alertness: Awake/alert Behavior During Therapy: Flat affect Overall Cognitive Status: Within Functional Limits for tasks assessed                     General Comments   Very appreciative. Asking when therapist will work with him again.                 Home Living Family/patient expects to be discharged to:: Inpatient rehab Living Arrangements: Spouse/significant other Available Help at Discharge: Family;Available 24 hours/day Type of Home: House Home Access: Stairs to enter [employer] of Steps: 3 Entrance Stairs-Rails: Right Home Layout: One level;Laundry or work area in [place]: Environmental consultant - 2 wheels;Cane - single point;Bedside commode   Additional Comments: wife reports that if he goes home, they will probably need tub bench  Lives With: Spouse    Prior Functioning/Environment Level of Independence: Independent with assistive device(s)        Comments: Enjoys doing woodworking. Used [employer] PTA on days he "wasn't feeling great"    OT Diagnosis: Generalized weakness   OT Problem List: Decreased strength;Decreased range of motion   OT Treatment/Interventions: Self-care/ADL training;Therapeutic exercise;Energy conservation;DME and/or AE instruction;Therapeutic activities;Patient/family  education;Balance training    OT Goals(Current goals can be found in the care plan section) Acute Rehab OT Goals Patient Stated Goal: get back to being independent again OT Goal Formulation: With patient Time For Goal Achievement: 01/12/15 Potential to Achieve Goals: Good  OT Frequency: Min 2X/week   Barriers to D/C:  none          Co-evaluation              End of Session Equipment Utilized During Treatment: Oxygen Nurse Communication: Mobility status  Activity Tolerance: Patient tolerated treatment well Patient left: in chair;with call bell/phone within reach;with family/visitor present   Time: 1110-1131 OT Time Calculation (min): 21 min Charges:  OT General Charges $OT Visit: 1 Procedure OT Evaluation $Initial OT Evaluation Tier I: 1 Procedure G-Codes:    Shiryl Ruddy,HILLARY Jan 02, 2015, 11:51 AM   Maurie Boettcher, OTR/L  [phone] 01-02-2015

## 2014-12-29 NOTE — Progress Notes (Signed)
I have insurance approval to readmit pt to inpt rehab today I will make the arrangements [phone]

## 2014-12-29 NOTE — H&P (View-Only) (Signed)
Physical Medicine and Rehabilitation Admission H&P    CC: MG crises.   HPI: Craig [name] is a 74 y.o. male with history of A fib, metastatic bladder cancer, recent PNA, MG who has had worsening over past months. He was evaluated by neurology 3 days PTA and was placed on steroids with increase in mestinon due to difficulty swallowing and SOB. Patient continued to worsen and was admitted via [place] on 12/05/14 with SOB, swallowing difficulty, difficulty hold head up with droopy eyelids and congestion. s. He was intubated due to respiratory distress and started on IV solumedrol as well as plasmapheresis per input by Dr. Doy Mince. He has responded to treatment and tolerated extubation on 01/25 with use of BIPAP prn. He is able to suction oral secretions and is currently NPO with tube feeds for nutritional support. He was treated with PLEX X 6 days with improvement in SOB. He was transferred to [facility] 12/17/14 and has been making good progress with in therapy with improvement in activity tolerance, decrease secretions as well as improvement in oxygenation. Repeat MBS showed poor recovery of swallow function therefore PEG was placed by Dr. Vernard Gambles on 12/26/14.  Later that evening patient developed mid sternal chest pain radiating to left shouder and cardiac enzymes as well as EKG was done for workup.  CTA chest was negative for PTX, effusions or PE and small amount of air seen anterior due to procedure.    ROS    Past Medical History  Diagnosis Date  . Atrial fibrillation, chronic     on Xarelto   . HTN (hypertension)   . CHF (congestive heart failure)   . Diabetes mellitus   . 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 Abdominal wall metastasis removal      History reviewed. No pertinent family history.    Social History:  reports that he has never smoked. He does not have any smokeless  tobacco history on file. He reports that he does not drink alcohol or use illicit drugs.    Allergies: No Known Allergies    Medications Prior to Admission  Medication Sig Dispense Refill  . amiodarone (PACERONE) 100 MG tablet Place 1 tablet (100 mg total) into feeding tube daily.    Marland Kitchen antiseptic oral rinse (CPC / CETYLPYRIDINIUM CHLORIDE 0.05%) 0.05 % LIQD solution 7 mLs by Mouth Rinse route QID.  0  . artificial tears (LACRILUBE) OINT ophthalmic ointment Place into both eyes every 4 (four) hours as needed for dry eyes.  0  . atorvastatin (LIPITOR) 40 MG tablet Place 1 tablet (40 mg total) into feeding tube daily.    . chlorhexidine (PERIDEX) 0.12 % solution 15 mLs by Mouth Rinse route 2 (two) times daily. 120 mL 0  . insulin aspart (NOVOLOG) 100 UNIT/ML injection Inject 0-20 Units into the skin every 4 (four) hours. 10 mL 11  . ipratropium (ATROVENT HFA) 17 MCG/ACT inhaler Inhale 1 puff into the lungs every 6 (six) hours as needed for wheezing.    . metoprolol tartrate (LOPRESSOR) 25 MG tablet Place 0.5 tablets (12.5 mg total) into feeding tube daily.    . metroNIDAZOLE (FLAGYL) 500 MG tablet Take 1 tablet (500 mg total) by mouth every 8 (eight) hours. Through 12/24/14, then stop    . Nutritional Supplements (FEEDING SUPPLEMENT, VITAL AF 1.2 CAL,) LIQD Place 1,000 mLs into feeding tube continuous.    Marland Kitchen  predniSONE (DELTASONE) 20 MG tablet Place 3 tablets (60 mg total) into feeding tube daily with breakfast.    . pyridostigmine (MESTINON) 60 MG tablet 1.5 tablets (90 mg total) by Per NG tube route 4 (four) times daily.    . rivaroxaban (XARELTO) 20 MG TABS tablet Take 20 mg by mouth daily with supper.    . Water For Irrigation, Sterile (FREE WATER) SOLN Place 200 mLs into feeding tube every 4 (four) hours.      Home: Home Living Family/patient expects to be discharged to:: Inpatient rehab Living Arrangements: Spouse/significant other Available Help at Discharge: Family, Available 24  hours/day Type of Home: House Home Access: Stairs to enter [employer] of Steps: 3 Entrance Stairs-Rails: Right Home Layout: One level, Laundry or work area in [place]: Environmental consultant - 2 wheels, [place] - single point, Bedside commode Additional Comments: wife reports that if he goes home, they will probably need tub bench  Lives With: Spouse   Functional History: Prior Function Level of Independence: Independent with assistive device(s) Comments: Enjoys doing woodworking. Used SPC PTA on days he "wasn't feeling great"  Functional Status:  Mobility: Bed Mobility Overal bed mobility: Needs Assistance Bed Mobility: Supine to Sit, Sit to Supine Supine to sit: Min assist, Mod assist General bed mobility comments: pt up in chair Transfers Overall transfer level: Needs assistance Equipment used: Rolling walker (2 wheeled) Transfers: Sit to/from Stand Sit to Stand: Min assist General transfer comment: 2 rys to trasition from sit - stand from lower suface with min A Ambulation/Gait Ambulation/Gait assistance: Min assist Ambulation Distance (Feet): 35 Feet Assistive device: Rolling walker (2 wheeled) Gait Pattern/deviations: Step-through pattern, Decreased step length - right, Decreased step length - left, Trunk flexed Gait velocity interpretation: Below normal speed for age/gender General Gait Details: Vc's for upright posture. Pt. stated he could not hold his neck up. Noted kinesiotape in place.  Pt ambulated with RW with cues for steering RW as well as cues for technique.  Decr safety at times needing cues for use of Rw.  Pt desats with activity needing 4LO2 to keep sats >90%.  Poor endurance at present but pt does push himself.      ADL: ADL Overall ADL's : Needs assistance/impaired Eating/Feeding: NPO Eating/Feeding Details (indicate cue type and reason): peg placemetn Grooming: Minimal assistance Grooming Details (indicate cue type and reason): Pt with  posterior lean when releasing RW with BUE requiring min A to compensate for LOB Upper Body Bathing: Minimal assitance, Sitting Lower Body Bathing: Minimal assistance, Sit to/from stand Lower Body Bathing Details (indicate cue type and reason): difficulty with releasing RW and maintinaing balance with peri care Upper Body Dressing : Moderate assistance Lower Body Dressing: Moderate assistance, Sit to/from stand Toilet Transfer: Minimal assistance, Ambulation, Stand-pivot Toileting- Clothing Manipulation and Hygiene: Minimal assistance, Sit to/from stand Functional mobility during ADLs: Minimal assistance, Rolling walker, Cueing for safety General ADL Comments: core and BUE  weakness and balance deficits affecting ADL status  Cognition: Cognition Overall Cognitive Status: Within Functional Limits for tasks assessed Orientation Level: Oriented X4 Cognition Arousal/Alertness: Awake/alert Behavior During Therapy: Flat affect Overall Cognitive Status: Within Functional Limits for tasks assessed  Physical Exam: Blood pressure 120/73, pulse 73, temperature 97.7 F (36.5 C), temperature source Axillary, resp. rate 22, height 5' 7"  (1.702 m), weight 106.006 kg (233 lb 11.2 oz), SpO2 94 %. Physical Exam  Results for orders placed or performed during the hospital encounter of 12/27/14 (from the past 48 hour(s))  Glucose,  capillary     Status: Abnormal   Collection Time: 12/27/14  4:59 PM  Result Value Ref Range   Glucose-Capillary 192 (H) 70 - 99 mg/dL  Glucose, capillary     Status: Abnormal   Collection Time: 12/27/14  8:58 PM  Result Value Ref Range   Glucose-Capillary 196 (H) 70 - 99 mg/dL  Glucose, capillary     Status: Abnormal   Collection Time: 12/28/14 12:28 AM  Result Value Ref Range   Glucose-Capillary 223 (H) 70 - 99 mg/dL  Glucose, capillary     Status: Abnormal   Collection Time: 12/28/14  4:20 AM  Result Value Ref Range   Glucose-Capillary 173 (H) 70 - 99 mg/dL  Glucose,  capillary     Status: Abnormal   Collection Time: 12/28/14  7:52 AM  Result Value Ref Range   Glucose-Capillary 212 (H) 70 - 99 mg/dL  Glucose, capillary     Status: Abnormal   Collection Time: 12/28/14 12:17 PM  Result Value Ref Range   Glucose-Capillary 228 (H) 70 - 99 mg/dL  Glucose, capillary     Status: Abnormal   Collection Time: 12/28/14  4:29 PM  Result Value Ref Range   Glucose-Capillary 266 (H) 70 - 99 mg/dL  Hemoglobin A1c     Status: Abnormal   Collection Time: 12/28/14  4:30 PM  Result Value Ref Range   Hgb A1c MFr Bld 6.9 (H) 4.8 - 5.6 %    Comment: (NOTE)         Pre-diabetes: 5.7 - 6.4         Diabetes: >6.4         Glycemic control for adults with diabetes: <7.0    Mean Plasma Glucose 151 mg/dL    Comment: (NOTE) Performed At: [facility] [place], [place] [id number] Lindon Romp MD [id number]   Glucose, capillary     Status: Abnormal   Collection Time: 12/28/14  8:01 PM  Result Value Ref Range   Glucose-Capillary 176 (H) 70 - 99 mg/dL  Brain natriuretic peptide     Status: Abnormal   Collection Time: 12/28/14 10:15 PM  Result Value Ref Range   B Natriuretic Peptide 250.1 (H) 0.0 - 100.0 pg/mL  Blood gas, arterial     Status: Abnormal   Collection Time: 12/28/14 11:23 PM  Result Value Ref Range   O2 Content 2.0 L/min   Delivery systems NASAL CANNULA    pH, Arterial 7.463 (H) 7.350 - 7.450   pCO2 arterial 45.3 (H) 35.0 - 45.0 mmHg   pO2, Arterial 65.6 (L) 80.0 - 100.0 mmHg   Bicarbonate 31.9 (H) 20.0 - 24.0 mEq/L   TCO2 33.3 0 - 100 mmol/L   Acid-Base Excess 7.9 (H) 0.0 - 2.0 mmol/L   O2 Saturation 92.8 %   Patient temperature 98.6    Collection site RIGHT RADIAL    Drawn by [phone]    Sample type ARTERIAL DRAW    Allens test (pass/fail) PASS PASS  Lactic acid, plasma     Status: None   Collection Time: 12/28/14 11:35 PM  Result Value Ref Range   Lactic Acid, Venous 1.6 0.5 - 2.0 mmol/L  Glucose, capillary     Status:  Abnormal   Collection Time: 12/28/14 11:59 PM  Result Value Ref Range   Glucose-Capillary 210 (H) 70 - 99 mg/dL  Glucose, capillary     Status: Abnormal   Collection Time: 12/29/14  3:46 AM  Result Value Ref Range  Glucose-Capillary 203 (H) 70 - 99 mg/dL  CBC     Status: Abnormal   Collection Time: 12/29/14  3:50 AM  Result Value Ref Range   WBC 15.8 (H) 4.0 - 10.5 K/uL   RBC 4.34 4.22 - 5.81 MIL/uL   Hemoglobin 13.6 13.0 - 17.0 g/dL   HCT 40.4 39.0 - 52.0 %   MCV 93.1 78.0 - 100.0 fL   MCH 31.3 26.0 - 34.0 pg   MCHC 33.7 30.0 - 36.0 g/dL   RDW 14.5 11.5 - 15.5 %   Platelets 201 150 - 400 K/uL  Basic metabolic panel     Status: Abnormal   Collection Time: 12/29/14  3:50 AM  Result Value Ref Range   Sodium 140 135 - 145 mmol/L   Potassium 3.1 (L) 3.5 - 5.1 mmol/L   Chloride 99 96 - 112 mmol/L   CO2 33 (H) 19 - 32 mmol/L   Glucose, Bld 197 (H) 70 - 99 mg/dL   BUN 12 6 - 23 mg/dL   Creatinine, Ser 0.68 0.50 - 1.35 mg/dL   Calcium 8.3 (L) 8.4 - 10.5 mg/dL   GFR calc non Af Amer >90 >90 mL/min   GFR calc Af Amer >90 >90 mL/min    Comment: (NOTE) The eGFR has been calculated using the CKD EPI equation. This calculation has not been validated in all clinical situations. eGFR's persistently <90 mL/min signify possible Chronic Kidney Disease.    Anion gap 8 5 - 15  Lactic acid, plasma     Status: None   Collection Time: 12/29/14  3:50 AM  Result Value Ref Range   Lactic Acid, Venous 1.5 0.5 - 2.0 mmol/L  Glucose, capillary     Status: Abnormal   Collection Time: 12/29/14  8:16 AM  Result Value Ref Range   Glucose-Capillary 199 (H) 70 - 99 mg/dL   Dg Chest Port 1 View  12/28/2014   CLINICAL DATA:  Cough and shortness of breath today.  EXAM: PORTABLE CHEST - 1 VIEW  COMPARISON:  View of the chest CT chest 12/27/2014.  FINDINGS: Right IJ catheter and left subclavian approach Port-A-Cath are again seen. Lung volumes are lower than on comparison plain film of the chest. The lungs  appear clear. There is cardiomegaly. No pneumothorax or pleural effusion.  IMPRESSION: Cardiomegaly without acute disease.   Electronically Signed   By: Inge Rise M.D.   On: 12/28/2014 22:37       Medical Problem List and Plan: 1. Functional deficits secondary to MG crises with respiratory failure.  2.  DVT Prophylaxis/Anticoagulation: Pharmaceutical: Xarelto 3. Pain Management: Tylenol prn 4. Mood: Team to provide ego support. LCSW to follow for evaluation and support.  5. Neuropsych: This patient is capable of making decisions on her own behalf. 6. Skin/Wound Care: routine pressure relief measures. Maintain adequate nutrition and hydration status.  7. Fluids/Electrolytes/Nutrition: Continue TF. Will add questran for bulking. Continue water flushes every 4 hours. Will check lytes in am.  8. Chest pain with acute respiratory failure: Likely due to procedure with sedation superimposed on recent MG crises. Encourage IS to help with atelectatic changes.  9. A fib: Monitor heart rate every 8 hours. Continue amiodarone and Xarelto.  10.  Hypokalemia: Add supplement and recheck levels in am.  12. MG crises: On prednisone 60 mg/daily and mestinon 90 mg qid (increased few days PTA). Monitor for recovery past PLEX.  13. Continued oro-pharyngeal dysphagia: NPO except for oral trials with ST. Continue tube feeds  via PEG.  14. C diff colitis: Has completed treatment with resolution of diarrhea.     Post Admission Physician Evaluation: 1. Functional deficits secondary  to myasthenia crisis (stay interrupted by chest pain and respiratory distress after PEG placement). 2. Patient is admitted to receive collaborative, interdisciplinary care between the physiatrist, rehab nursing staff, and therapy team. 3. Patient's level of medical complexity and substantial therapy needs in context of that medical necessity cannot be provided at a lesser intensity of care such as a SNF. 4. Patient has  experienced substantial functional loss from his/her baseline which was documented above under the "Functional History" and "Functional Status" headings.  Judging by the patient's diagnosis, physical exam, and functional history, the patient has potential for functional progress which will result in measurable gains while on inpatient rehab.  These gains will be of substantial and practical use upon discharge  in facilitating mobility and self-care at the household level. 5. Physiatrist will provide 24 hour management of medical needs as well as oversight of the therapy plan/treatment and provide guidance as appropriate regarding the interaction of the two. 6. 24 hour rehab nursing will assist with bladder management, bowel management, safety, skin/wound care, disease management, medication administration, pain management and patient education  and help integrate therapy concepts, techniques,education, etc. 7. PT will assess and treat for/with: Lower extremity strength, range of motion, stamina, balance, functional mobility, safety, adaptive techniques and equipment, NMR, breathing techqniques, endurance.   Goals are: mod I to supervision. 8. OT will assess and treat for/with: ADL's, functional mobility, safety, upper extremity strength, adaptive techniques and equipment, NMR, activity tolerance, stamina.   Goals are: mod I to supervision. Therapy may proceed with showering this patient. 9. SLP will assess and treat for/with: speech, swallowing, communication.  Goals are: mod I for communication and max assist for swallowing (will have longer term swallowing needs). 10. Case Management and Social Worker will assess and treat for psychological issues and discharge planning. 11. Team conference will be held weekly to assess progress toward goals and to determine barriers to discharge. 12. Patient will receive at least 3 hours of therapy per day at least 5 days per week. 13. ELOS: 5-7 days        14. Prognosis:  excellent     Meredith Staggers, MD, [place] [facility] 12/29/2014   12/29/2014

## 2014-12-29 NOTE — Progress Notes (Signed)
Inpatient Diabetes Program Recommendations  AACE/ADA: New Consensus Statement on Inpatient Glycemic Control (2013)  Target Ranges:  Prepandial:   less than 140 mg/dL      Peak postprandial:   less than 180 mg/dL (1-2 hours)      Critically ill patients:  140 - 180 mg/dL    Inpatient Diabetes Program Recommendations Correction (SSI): Please consider increase in correction to moderate q 4 hrs with tube feed coverage per below  Tube feed coverage of 3-4 units q 4 hrs-esp while on high-dose Prednisone  Thank you Rosita Kea, RN, MSN, CDE  Diabetes Inpatient Program Office: [phone] Pager: [phone] 8:00 am to 5:00 pm

## 2014-12-29 NOTE — Progress Notes (Signed)
RT placed pt on Bipap set at 18/8 with 2Lpm of oxygen bled in via home full face mask. Pt looks comfortable and is tolerating Bipap well at this time. RT will continue to monitor as needed.

## 2014-12-29 NOTE — Progress Notes (Signed)
Referring Physician(s): Dr. Naaman Plummer  Subjective: Patient s/p G-tube in IR 2/12 with post op free intraperitoneal air. He denies any abdominal pain, shortness of breath or chest pain today and states he is feeling better.  Allergies: Review of patient's allergies indicates no known allergies.  Medications: Prior to Admission medications   Medication Sig Start Date End Date Taking? Authorizing Provider  amiodarone (PACERONE) 100 MG tablet Place 1 tablet (100 mg total) into feeding tube daily. 12/17/14  Yes Delfina Redwood, MD  antiseptic oral rinse (CPC / CETYLPYRIDINIUM CHLORIDE 0.05%) 0.05 % LIQD solution 7 mLs by Mouth Rinse route QID. 12/17/14  Yes Delfina Redwood, MD  artificial tears (LACRILUBE) OINT ophthalmic ointment Place into both eyes every 4 (four) hours as needed for dry eyes. 12/17/14  Yes Delfina Redwood, MD  atorvastatin (LIPITOR) 40 MG tablet Place 1 tablet (40 mg total) into feeding tube daily. 12/17/14  Yes Delfina Redwood, MD  chlorhexidine (PERIDEX) 0.12 % solution 15 mLs by Mouth Rinse route 2 (two) times daily. 12/17/14  Yes Delfina Redwood, MD  insulin aspart (NOVOLOG) 100 UNIT/ML injection Inject 0-20 Units into the skin every 4 (four) hours. 12/17/14  Yes Delfina Redwood, MD  ipratropium (ATROVENT HFA) 17 MCG/ACT inhaler Inhale 1 puff into the lungs every 6 (six) hours as needed for wheezing.   Yes Historical Provider, MD  metoprolol tartrate (LOPRESSOR) 25 MG tablet Place 0.5 tablets (12.5 mg total) into feeding tube daily. 12/17/14  Yes Delfina Redwood, MD  metroNIDAZOLE (FLAGYL) 500 MG tablet Take 1 tablet (500 mg total) by mouth every 8 (eight) hours. Through 12/24/14, then stop 12/17/14  Yes Delfina Redwood, MD  Nutritional Supplements (FEEDING SUPPLEMENT, VITAL AF 1.2 CAL,) LIQD Place 1,000 mLs into feeding tube continuous. 12/17/14  Yes Delfina Redwood, MD  predniSONE (DELTASONE) 20 MG tablet Place 3 tablets (60 mg total) into feeding tube daily with  breakfast. 12/17/14  Yes Delfina Redwood, MD  pyridostigmine (MESTINON) 60 MG tablet 1.5 tablets (90 mg total) by Per NG tube route 4 (four) times daily. 12/17/14  Yes Delfina Redwood, MD  rivaroxaban (XARELTO) 20 MG TABS tablet Take 20 mg by mouth daily with supper.   Yes Historical Provider, MD  Water For Irrigation, Sterile (FREE WATER) SOLN Place 200 mLs into feeding tube every 4 (four) hours. 12/17/14  Yes Delfina Redwood, MD     Vital Signs: BP 120/73 mmHg  Pulse 73  Temp(Src) 97.7 F (36.5 C) (Axillary)  Resp 22  Ht 5\' 7"  (1.702 m)  Wt 233 lb 11.2 oz (106.006 kg)  BMI 36.59 kg/m2  SpO2 94%  Physical Exam General: Alert, awake, NAD Abd: Soft, ND, NT, (+)BS, G-tube dressing C/D/I  Imaging: Ct Abdomen Wo Contrast  12/25/2014   CLINICAL DATA:  Initial encounter for evaluate anatomy secondary to G-tube placement. 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 Dysphagia.  EXAM: CT ABDOMEN WITHOUT CONTRAST  TECHNIQUE: Multidetector CT imaging of the abdomen was performed following the standard protocol without IV contrast.  COMPARISON:  12/02/2014  FINDINGS: Lower chest: Dependent left base atelectasis. Trace left pleural fluid or thickening, new. Mild cardiomegaly with coronary artery atherosclerosis.  Hepatobiliary: Old granulomatous disease in the liver. Contracted gallbladder, without biliary ductal dilatation.  Pancreas: Fatty replacement involving the pancreatic head and uncinate process. No duct dilatation or acute pancreatitis.  Spleen: Normal  Adrenals/Urinary Tract: Normal adrenal glands. No renal calculi or hydronephrosis. No abdominal  hydroureter or ureteric stone.  Stomach/Bowel: Nasogastric tube terminates at the body of the stomach. The gastric body is positioned anteriorly, without interposition of colon or small bowel. There is no omental nodularity in the region of the greater curvature of the stomach. No abnormal gastric wall thickening.  Extensive colonic  diverticulosis. Contrast identified throughout the colon. Small bowel loops and appendix are not well opacified. Small bowel is normal in caliber.  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 1.9 cm on image 34 versus similar (when remeasured). Aortic and branch vessel atherosclerosis. No retroperitoneal or retrocrural adenopathy.  Other: No ascites. Omental nodularity is again identified. An 11 mm nodule on image 54 likely represents enlargement of a 3 mm nodule on image 51 of the prior exam. Right-sided abdominal omental nodule measures 1.5 cm on image 50, enlarged from 9 mm on the prior exam (when remeasured).  Musculoskeletal: Degenerative disc disease, most advanced at L4-5 and L1-2. Convex right lumbar spine curvature.  IMPRESSION: 1. Progressive omental/peritoneal metastasis. 2. No disease identified along the anterior aspect of the stomach to preclude gastrostomy tube placement. 3. Celiac origin aneurysm, similar. 4. Left base atelectasis with adjacent trace left pleural fluid or thickening, new.   Electronically Signed   By: Abigail Miyamoto M.D.   On: 12/25/2014 16:34   Ct Angio Chest Pe W/cm &/or Wo Cm  12/27/2014   CLINICAL DATA:  Patient complaining of chest pain and left shoulder pain. Diaphoretic.  EXAM: CT ANGIOGRAPHY CHEST WITH CONTRAST  TECHNIQUE: Multidetector CT imaging of the chest was performed using the standard protocol during bolus administration of intravenous contrast. Multiplanar CT image reconstructions and MIPs were obtained to evaluate the vascular anatomy.  CONTRAST:  178mL OMNIPAQUE IOHEXOL 350 MG/ML SOLN  COMPARISON:  Chest radiograph 12/27/2014; abdomen pelvic CT 12/25/2014  FINDINGS: Adequate opacification of the pulmonary arterial system. Evaluation of the subsegmental pulmonary arteries limited due to motion artifact. Within the above limitation, no definite evidence for pulmonary embolism.  Left anterior chest wall Port-A-Cath is present  with tip terminating in the superior vena cava. Right-sided central venous catheter is present with tip terminating at the superior cavoatrial junction. Heart is enlarged. No pericardial effusion. Coronary arterial vascular calcifications. No axillary, mediastinal or hilar lymphadenopathy. Small hiatal hernia.  The central airways are patent. There is dependent atelectasis within the bilateral lower lobes. No definite pleural effusion or pneumothorax.  Visualization of the upper abdomen demonstrates interval placement of gastrostomy tube. Tube appears in appropriate position. There is a small amount of free intraperitoneal air adjacent to the anterior aspect of the stomach, likely postprocedural in etiology. Small gallstone within the gallbladder lumen. Unchanged bilateral adrenal thickening. Re- demonstrated omental nodularity. 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  Degenerative changes of the thoracic spine without aggressive or acute appearing osseous lesions.  Review of the MIP images confirms the above findings.  IMPRESSION: No evidence for pulmonary embolism. Limited evaluation of the subsegmental pulmonary arteries due to motion artifact.  Interval placement of gastrostomy tube. Small amount of free intraperitoneal air adjacent to the anterior margin the stomach, likely postprocedural in etiology.  Re- demonstrated omental nodularity, compatible with metastatic disease.  Celiac origin aneurysm, stable.  Bilateral lower lobe atelectatic changes.  Critical Value/emergent results were called by telephone at the time of interpretation on 12/27/2014 at 8:00 am to Nurse [facility] , who verbally acknowledged these results and will page Dr. Dyann Kief.   Electronically Signed   By: Lovey Newcomer  M.D.   On: 12/27/2014 08:03   Ir Gastrostomy Tube Mod Sed  12/26/2014   CLINICAL DATA:  Myasthenia gravis, dysphasia, needs enteral feeding support.  EXAM: PERC PLACEMENT GASTROSTOMY  FLUOROSCOPY TIME:  1  minutes 48 seconds  TECHNIQUE: The procedure, risks, benefits, and alternatives were explained to the patient. Questions regarding the procedure were encouraged and answered. The patient understands and consents to the procedure. As antibiotic prophylaxis, cefazolin was ordered pre-procedure and administered intravenously within one hour of incision.Progression of previously administered oral barium into the colon was confirmed fluoroscopically. A 5 French angiographic catheter was placed as orogastric tube. The upper abdomen was prepped with Betadine, draped in usual sterile fashion, and infiltrated locally with 1% lidocaine.  Intravenous Fentanyl and Versed were administered as conscious sedation during continuous cardiorespiratory monitoring by the radiology RN, with a total moderate sedation time of ten minutes.  Stomach was insufflated using air through the orogastric tube. An 21 French sheath needle was advanced percutaneously into the gastric lumen under fluoroscopy. Gas could be aspirated and a small contrast injection confirmed intraluminal spread. The sheath was exchanged over a guidewire for a 9 [place] vascular sheath, through which the snare device was advanced and used to snare a guidewire passed through the orogastric tube. This was withdrawn, and the snare attached to the 20 French pull-through gastrostomy tube, which was advanced antegrade, positioned with the internal bumper securing the anterior gastric wall to the anterior abdominal wall. Small contrast injection confirms appropriate positioning. The external bumper was applied and the catheter was flushed.  COMPLICATIONS: COMPLICATIONS none  IMPRESSION: 1. Technically successful 20 French pull-through gastrostomy placement under fluoroscopy.   Electronically Signed   By: Lucrezia Europe M.D.   On: 12/26/2014 11:26   Dg Chest Port 1 View  12/28/2014   CLINICAL DATA:  Cough and shortness of breath today.  EXAM: PORTABLE CHEST - 1 VIEW  COMPARISON:   View of the chest CT chest 12/27/2014.  FINDINGS: Right IJ catheter and left subclavian approach Port-A-Cath are again seen. Lung volumes are lower than on comparison plain film of the chest. The lungs appear clear. There is cardiomegaly. No pneumothorax or pleural effusion.  IMPRESSION: Cardiomegaly without acute disease.   Electronically Signed   By: Inge Rise M.D.   On: 12/28/2014 22:37   Dg Chest Port 1 View  12/27/2014   CLINICAL DATA:  Acute onset of chest pain and shortness of breath. Initial encounter.  EXAM: PORTABLE CHEST - 1 VIEW  COMPARISON:  Chest radiograph performed 12/11/2014  FINDINGS: The lungs are hypoexpanded. Minimal left basilar opacity likely reflects atelectasis. There is no evidence of pleural effusion or pneumothorax.  The cardiomediastinal silhouette is borderline enlarged. A right IJ dual lumen catheter seen ending about the mid SVC. A left-sided chest port is noted ending about the distal SVC. No acute osseous abnormalities are seen.  IMPRESSION: Lungs hypoexpanded. Minimal left basilar opacity likely reflects atelectasis; lungs otherwise grossly clear.   Electronically Signed   By: Garald Balding M.D.   On: 12/27/2014 02:55    Labs:  CBC:  Recent Labs  12/26/14 0525 12/27/14 0300 12/27/14 0800 12/29/14 0350  WBC 10.1 11.1* 11.3* 15.8*  HGB 12.5* 13.1 13.3 13.6  HCT 37.3* 39.3 39.8 40.4  PLT 226 202 213 201    COAGS:  Recent Labs  12/05/14 2015 12/09/14 0400 12/26/14 0525  INR 1.24 1.44 1.08  APTT  --   --  27    BMP:  Recent  Labs  12/25/14 0600 12/27/14 0300 12/27/14 0800 12/29/14 0350  NA 141 139 138 140  K 3.7 4.1 3.6 3.1*  CL 104 103 99 99  CO2 32 32 31 33*  GLUCOSE 130* 170* 123* 197*  BUN 11 7 9 12   CALCIUM 8.7 8.5 8.3* 8.3*  CREATININE 0.71 0.67 0.60 0.68  GFRNONAA >90 >90 >90 >90  GFRAA >90 >90 >90 >90    LIVER FUNCTION TESTS:  Recent Labs  12/05/14 2015 12/06/14 0357 12/18/14 0500 12/27/14 0300  BILITOT 0.9 0.9  0.8 0.9  AST 42* 18 32 35  ALT 45 20 33 54*  ALKPHOS 70 30* 36* 46  PROT 6.8 5.4* 4.4* 5.2*  ALBUMIN 3.6 4.2 3.5 3.0*    Assessment and Plan: Severe Dysphagia S/p percutaneous 42F G-tube 2/12 in IR Post op free intraperitoneal air anterior to stomach not uncommon with procedure with c/o chest pain/abdominal pain, trop (-) x 3 Symptoms improving, G-tube working well Continue with tube feeds.  SignedHedy Jacob 12/29/2014, 11:03 AM   I spent a total of 15 in face to face in clinical consultation/evaluation, greater than 50% of which was counseling/coordinating care for G-tube.

## 2014-12-29 NOTE — Interval H&P Note (Signed)
Craig [name] was admitted today to Inpatient Rehabilitation with the diagnosis of myasthenia gravis crisis.  The patient's history has been reviewed, patient examined, and there is no change in status.  Patient continues to be appropriate for intensive inpatient rehabilitation.  I have reviewed the patient's chart and labs.  Questions were answered to the patient's satisfaction.  Faraaz Wolin T 12/29/2014, 7:19 PM

## 2014-12-29 NOTE — H&P (Signed)
Physical Medicine and Rehabilitation Admission H&P    CC: MG crises.   HPI: Craig [name] is a 74 y.o. male with history of A fib, metastatic bladder cancer, recent PNA, MG who has had worsening over past months. He was evaluated by neurology 3 days PTA and was placed on steroids with increase in mestinon due to difficulty swallowing and SOB. Patient continued to worsen and was admitted via [place] on 12/05/14 with SOB, swallowing difficulty, difficulty hold head up with droopy eyelids and congestion. s. He was intubated due to respiratory distress and started on IV solumedrol as well as plasmapheresis per input by Dr. Doy Mince. He has responded to treatment and tolerated extubation on 01/25 with use of BIPAP prn. He is able to suction oral secretions and is currently NPO with tube feeds for nutritional support. He was treated with PLEX X 6 days with improvement in SOB. He was transferred to [facility] 12/17/14 and has been making good progress with in therapy with improvement in activity tolerance, decrease secretions as well as improvement in oxygenation. Repeat MBS showed poor recovery of swallow function therefore PEG was placed by Dr. Vernard Gambles on 12/26/14.  Later that evening patient developed mid sternal chest pain radiating to left shouder and cardiac enzymes as well as EKG was done for workup.  CTA chest was negative for PTX, effusions or PE and small amount of air seen anterior due to procedure.    ROS    Past Medical History  Diagnosis Date  . Atrial fibrillation, chronic     on Xarelto   . HTN (hypertension)   . CHF (congestive heart failure)   . Diabetes mellitus   . 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 Abdominal wall metastasis removal      History reviewed. No pertinent family history.    Social History:  reports that he has never smoked. He does not have any smokeless  tobacco history on file. He reports that he does not drink alcohol or use illicit drugs.    Allergies: No Known Allergies    Medications Prior to Admission  Medication Sig Dispense Refill  . amiodarone (PACERONE) 100 MG tablet Place 1 tablet (100 mg total) into feeding tube daily.    Marland Kitchen antiseptic oral rinse (CPC / CETYLPYRIDINIUM CHLORIDE 0.05%) 0.05 % LIQD solution 7 mLs by Mouth Rinse route QID.  0  . artificial tears (LACRILUBE) OINT ophthalmic ointment Place into both eyes every 4 (four) hours as needed for dry eyes.  0  . atorvastatin (LIPITOR) 40 MG tablet Place 1 tablet (40 mg total) into feeding tube daily.    . chlorhexidine (PERIDEX) 0.12 % solution 15 mLs by Mouth Rinse route 2 (two) times daily. 120 mL 0  . insulin aspart (NOVOLOG) 100 UNIT/ML injection Inject 0-20 Units into the skin every 4 (four) hours. 10 mL 11  . ipratropium (ATROVENT HFA) 17 MCG/ACT inhaler Inhale 1 puff into the lungs every 6 (six) hours as needed for wheezing.    . metoprolol tartrate (LOPRESSOR) 25 MG tablet Place 0.5 tablets (12.5 mg total) into feeding tube daily.    . metroNIDAZOLE (FLAGYL) 500 MG tablet Take 1 tablet (500 mg total) by mouth every 8 (eight) hours. Through 12/24/14, then stop    . Nutritional Supplements (FEEDING SUPPLEMENT, VITAL AF 1.2 CAL,) LIQD Place 1,000 mLs into feeding tube continuous.    Marland Kitchen  predniSONE (DELTASONE) 20 MG tablet Place 3 tablets (60 mg total) into feeding tube daily with breakfast.    . pyridostigmine (MESTINON) 60 MG tablet 1.5 tablets (90 mg total) by Per NG tube route 4 (four) times daily.    . rivaroxaban (XARELTO) 20 MG TABS tablet Take 20 mg by mouth daily with supper.    . Water For Irrigation, Sterile (FREE WATER) SOLN Place 200 mLs into feeding tube every 4 (four) hours.      Home: Home Living Family/patient expects to be discharged to:: Inpatient rehab Living Arrangements: Spouse/significant other Available Help at Discharge: Family, Available 24  hours/day Type of Home: House Home Access: Stairs to enter [employer] of Steps: 3 Entrance Stairs-Rails: Right Home Layout: One level, Laundry or work area in [place]: Environmental consultant - 2 wheels, [place] - single point, Bedside commode Additional Comments: wife reports that if he goes home, they will probably need tub bench  Lives With: Spouse   Functional History: Prior Function Level of Independence: Independent with assistive device(s) Comments: Enjoys doing woodworking. Used SPC PTA on days he "wasn't feeling great"  Functional Status:  Mobility: Bed Mobility Overal bed mobility: Needs Assistance Bed Mobility: Supine to Sit, Sit to Supine Supine to sit: Min assist, Mod assist General bed mobility comments: pt up in chair Transfers Overall transfer level: Needs assistance Equipment used: Rolling walker (2 wheeled) Transfers: Sit to/from Stand Sit to Stand: Min assist General transfer comment: 2 rys to trasition from sit - stand from lower suface with min A Ambulation/Gait Ambulation/Gait assistance: Min assist Ambulation Distance (Feet): 35 Feet Assistive device: Rolling walker (2 wheeled) Gait Pattern/deviations: Step-through pattern, Decreased step length - right, Decreased step length - left, Trunk flexed Gait velocity interpretation: Below normal speed for age/gender General Gait Details: Vc's for upright posture. Pt. stated he could not hold his neck up. Noted kinesiotape in place.  Pt ambulated with RW with cues for steering RW as well as cues for technique.  Decr safety at times needing cues for use of Rw.  Pt desats with activity needing 4LO2 to keep sats >90%.  Poor endurance at present but pt does push himself.      ADL: ADL Overall ADL's : Needs assistance/impaired Eating/Feeding: NPO Eating/Feeding Details (indicate cue type and reason): peg placemetn Grooming: Minimal assistance Grooming Details (indicate cue type and reason): Pt with  posterior lean when releasing RW with BUE requiring min A to compensate for LOB Upper Body Bathing: Minimal assitance, Sitting Lower Body Bathing: Minimal assistance, Sit to/from stand Lower Body Bathing Details (indicate cue type and reason): difficulty with releasing RW and maintinaing balance with peri care Upper Body Dressing : Moderate assistance Lower Body Dressing: Moderate assistance, Sit to/from stand Toilet Transfer: Minimal assistance, Ambulation, Stand-pivot Toileting- Clothing Manipulation and Hygiene: Minimal assistance, Sit to/from stand Functional mobility during ADLs: Minimal assistance, Rolling walker, Cueing for safety General ADL Comments: core and BUE  weakness and balance deficits affecting ADL status  Cognition: Cognition Overall Cognitive Status: Within Functional Limits for tasks assessed Orientation Level: Oriented X4 Cognition Arousal/Alertness: Awake/alert Behavior During Therapy: Flat affect Overall Cognitive Status: Within Functional Limits for tasks assessed  Physical Exam: Blood pressure 120/73, pulse 73, temperature 97.7 F (36.5 C), temperature source Axillary, resp. rate 22, height 5' 7"  (1.702 m), weight 106.006 kg (233 lb 11.2 oz), SpO2 94 %. Physical Exam  Results for orders placed or performed during the hospital encounter of 12/27/14 (from the past 48 hour(s))  Glucose,  capillary     Status: Abnormal   Collection Time: 12/27/14  4:59 PM  Result Value Ref Range   Glucose-Capillary 192 (H) 70 - 99 mg/dL  Glucose, capillary     Status: Abnormal   Collection Time: 12/27/14  8:58 PM  Result Value Ref Range   Glucose-Capillary 196 (H) 70 - 99 mg/dL  Glucose, capillary     Status: Abnormal   Collection Time: 12/28/14 12:28 AM  Result Value Ref Range   Glucose-Capillary 223 (H) 70 - 99 mg/dL  Glucose, capillary     Status: Abnormal   Collection Time: 12/28/14  4:20 AM  Result Value Ref Range   Glucose-Capillary 173 (H) 70 - 99 mg/dL  Glucose,  capillary     Status: Abnormal   Collection Time: 12/28/14  7:52 AM  Result Value Ref Range   Glucose-Capillary 212 (H) 70 - 99 mg/dL  Glucose, capillary     Status: Abnormal   Collection Time: 12/28/14 12:17 PM  Result Value Ref Range   Glucose-Capillary 228 (H) 70 - 99 mg/dL  Glucose, capillary     Status: Abnormal   Collection Time: 12/28/14  4:29 PM  Result Value Ref Range   Glucose-Capillary 266 (H) 70 - 99 mg/dL  Hemoglobin A1c     Status: Abnormal   Collection Time: 12/28/14  4:30 PM  Result Value Ref Range   Hgb A1c MFr Bld 6.9 (H) 4.8 - 5.6 %    Comment: (NOTE)         Pre-diabetes: 5.7 - 6.4         Diabetes: >6.4         Glycemic control for adults with diabetes: <7.0    Mean Plasma Glucose 151 mg/dL    Comment: (NOTE) Performed At: [facility] [place], [place] [id number] Lindon Romp MD SJ:[id number]   Glucose, capillary     Status: Abnormal   Collection Time: 12/28/14  8:01 PM  Result Value Ref Range   Glucose-Capillary 176 (H) 70 - 99 mg/dL  Brain natriuretic peptide     Status: Abnormal   Collection Time: 12/28/14 10:15 PM  Result Value Ref Range   B Natriuretic Peptide 250.1 (H) 0.0 - 100.0 pg/mL  Blood gas, arterial     Status: Abnormal   Collection Time: 12/28/14 11:23 PM  Result Value Ref Range   O2 Content 2.0 L/min   Delivery systems NASAL CANNULA    pH, Arterial 7.463 (H) 7.350 - 7.450   pCO2 arterial 45.3 (H) 35.0 - 45.0 mmHg   pO2, Arterial 65.6 (L) 80.0 - 100.0 mmHg   Bicarbonate 31.9 (H) 20.0 - 24.0 mEq/L   TCO2 33.3 0 - 100 mmol/L   Acid-Base Excess 7.9 (H) 0.0 - 2.0 mmol/L   O2 Saturation 92.8 %   Patient temperature 98.6    Collection site RIGHT RADIAL    Drawn by [phone]    Sample type ARTERIAL DRAW    Allens test (pass/fail) PASS PASS  Lactic acid, plasma     Status: None   Collection Time: 12/28/14 11:35 PM  Result Value Ref Range   Lactic Acid, Venous 1.6 0.5 - 2.0 mmol/L  Glucose, capillary     Status:  Abnormal   Collection Time: 12/28/14 11:59 PM  Result Value Ref Range   Glucose-Capillary 210 (H) 70 - 99 mg/dL  Glucose, capillary     Status: Abnormal   Collection Time: 12/29/14  3:46 AM  Result Value Ref Range  Glucose-Capillary 203 (H) 70 - 99 mg/dL  CBC     Status: Abnormal   Collection Time: 12/29/14  3:50 AM  Result Value Ref Range   WBC 15.8 (H) 4.0 - 10.5 K/uL   RBC 4.34 4.22 - 5.81 MIL/uL   Hemoglobin 13.6 13.0 - 17.0 g/dL   HCT 40.4 39.0 - 52.0 %   MCV 93.1 78.0 - 100.0 fL   MCH 31.3 26.0 - 34.0 pg   MCHC 33.7 30.0 - 36.0 g/dL   RDW 14.5 11.5 - 15.5 %   Platelets 201 150 - 400 K/uL  Basic metabolic panel     Status: Abnormal   Collection Time: 12/29/14  3:50 AM  Result Value Ref Range   Sodium 140 135 - 145 mmol/L   Potassium 3.1 (L) 3.5 - 5.1 mmol/L   Chloride 99 96 - 112 mmol/L   CO2 33 (H) 19 - 32 mmol/L   Glucose, Bld 197 (H) 70 - 99 mg/dL   BUN 12 6 - 23 mg/dL   Creatinine, Ser 0.68 0.50 - 1.35 mg/dL   Calcium 8.3 (L) 8.4 - 10.5 mg/dL   GFR calc non Af Amer >90 >90 mL/min   GFR calc Af Amer >90 >90 mL/min    Comment: (NOTE) The eGFR has been calculated using the CKD EPI equation. This calculation has not been validated in all clinical situations. eGFR's persistently <90 mL/min signify possible Chronic Kidney Disease.    Anion gap 8 5 - 15  Lactic acid, plasma     Status: None   Collection Time: 12/29/14  3:50 AM  Result Value Ref Range   Lactic Acid, Venous 1.5 0.5 - 2.0 mmol/L  Glucose, capillary     Status: Abnormal   Collection Time: 12/29/14  8:16 AM  Result Value Ref Range   Glucose-Capillary 199 (H) 70 - 99 mg/dL   Dg Chest Port 1 View  12/28/2014   CLINICAL DATA:  Cough and shortness of breath today.  EXAM: PORTABLE CHEST - 1 VIEW  COMPARISON:  View of the chest CT chest 12/27/2014.  FINDINGS: Right IJ catheter and left subclavian approach Port-A-Cath are again seen. Lung volumes are lower than on comparison plain film of the chest. The lungs  appear clear. There is cardiomegaly. No pneumothorax or pleural effusion.  IMPRESSION: Cardiomegaly without acute disease.   Electronically Signed   By: Inge Rise M.D.   On: 12/28/2014 22:37       Medical Problem List and Plan: 1. Functional deficits secondary to MG crises with respiratory failure.  2.  DVT Prophylaxis/Anticoagulation: Pharmaceutical: Xarelto 3. Pain Management: Tylenol prn 4. Mood: Team to provide ego support. LCSW to follow for evaluation and support.  5. Neuropsych: This patient is capable of making decisions on her own behalf. 6. Skin/Wound Care: routine pressure relief measures. Maintain adequate nutrition and hydration status.  7. Fluids/Electrolytes/Nutrition: Continue TF. Will add questran for bulking. Continue water flushes every 4 hours. Will check lytes in am.  8. Chest pain with acute respiratory failure: Likely due to procedure with sedation superimposed on recent MG crises. Encourage IS to help with atelectatic changes.  9. A fib: Monitor heart rate every 8 hours. Continue amiodarone and Xarelto.  10.  Hypokalemia: Add supplement and recheck levels in am.  12. MG crises: On prednisone 60 mg/daily and mestinon 90 mg qid (increased few days PTA). Monitor for recovery past PLEX.  13. Continued oro-pharyngeal dysphagia: NPO except for oral trials with ST. Continue tube feeds  via PEG.  14. C diff colitis: Has completed treatment with resolution of diarrhea.     Post Admission Physician Evaluation: 1. Functional deficits secondary  to myasthenia crisis (stay interrupted by chest pain and respiratory distress after PEG placement). 2. Patient is admitted to receive collaborative, interdisciplinary care between the physiatrist, rehab nursing staff, and therapy team. 3. Patient's level of medical complexity and substantial therapy needs in context of that medical necessity cannot be provided at a lesser intensity of care such as a SNF. 4. Patient has  experienced substantial functional loss from his/her baseline which was documented above under the "Functional History" and "Functional Status" headings.  Judging by the patient's diagnosis, physical exam, and functional history, the patient has potential for functional progress which will result in measurable gains while on inpatient rehab.  These gains will be of substantial and practical use upon discharge  in facilitating mobility and self-care at the household level. 5. Physiatrist will provide 24 hour management of medical needs as well as oversight of the therapy plan/treatment and provide guidance as appropriate regarding the interaction of the two. 6. 24 hour rehab nursing will assist with bladder management, bowel management, safety, skin/wound care, disease management, medication administration, pain management and patient education  and help integrate therapy concepts, techniques,education, etc. 7. PT will assess and treat for/with: Lower extremity strength, range of motion, stamina, balance, functional mobility, safety, adaptive techniques and equipment, NMR, breathing techqniques, endurance.   Goals are: mod I to supervision. 8. OT will assess and treat for/with: ADL's, functional mobility, safety, upper extremity strength, adaptive techniques and equipment, NMR, activity tolerance, stamina.   Goals are: mod I to supervision. Therapy may proceed with showering this patient. 9. SLP will assess and treat for/with: speech, swallowing, communication.  Goals are: mod I for communication and max assist for swallowing (will have longer term swallowing needs). 10. Case Management and Social Worker will assess and treat for psychological issues and discharge planning. 11. Team conference will be held weekly to assess progress toward goals and to determine barriers to discharge. 12. Patient will receive at least 3 hours of therapy per day at least 5 days per week. 13. ELOS: 5-7 days        14. Prognosis:  excellent     Meredith Staggers, MD, [place] [facility] 12/29/2014   12/29/2014

## 2014-12-29 NOTE — PMR Pre-admission (Signed)
PMR Admission Coordinator Pre-Admission Assessment  Patient: Craig [name] is an 74 y.o., male MRN: [id number] DOB: 02-03-1941 Height: 5' 7" (170.2 cm) Weight: 106.006 kg (233 lb 11.2 oz)              Insurance Information HMO:     PPO: yes     PCP:      IPA:      80/20:      OTHER: Medicare advantage plan PRIMARY: [employer]      Policy#: [id number]      Subscriber: pt CM Name: Enrique Sack      Phone#: [phone]     Fax#: [phone] Pre-Cert#: [id number] approved for 7 days. Enrique Sack has EPIC access to follow     Employer: retired Benefits:  Phone #: [phone]     Name: 12/11/14 Eff. Date: 11/14/14     Deduct: none      Out of Pocket Max: $3100      Life Max: none CIR: $225 copay per admit then $75 per day days 2 through 5      SNF: no copay days 1-20; $140 copay per day days 21-100 Outpatient: $10 per visit copay     Co-Pay: no visit limit Home Health: $10 per visit copay      Co-Pay: no visit limit DME: 80%     Co-Pay: 20% Providers: in network  SECONDARY: none        Medicaid Application Date:       Case Manager:  Disability Application Date:       Case Worker:   Emergency Facilities manager Information    Name Relation Home Work Mobile   Dimichele,Martha Spouse [phone]     Jiaire, [place]   [phone]     Current Medical History  Patient Admitting Diagnosis: Myasthenia Gravis with crisis  History of Present Illness: Craig [name] is a 74 y.o. male with history of A fib, metastatic bladder cancer, recent PNA, MG who has had worsening over past months. He was evaluated by neurology 3 days PTA and was placed on steroids with increase in mestinon due to difficulty swallowing and SOB. Patient continued to worsen and was admitted via [place] on 12/05/14 with SOB, swallowing difficulty, difficulty hold head up with droopy eyelids and congestion. s. He was intubated due to respiratory distress and started on IV solumedrol as well as plasmapheresis per input by Dr.  Doy Mince. He has responded to treatment and tolerated extubation on 01/25 with use of BIPAP prn. He is able to suction oral secretions and is currently NPO with tube feeds for nutritional support. He was treated with PLEX X 6 days with improvement in SOB. He was transferred to [facility] 12/17/14 and has been making good progress with in therapy with improvement in activity tolerance, decrease secretions as well as improvement in oxygenation. Repeat MBS showed poor recovery of swallow function therefore PEG was placed by Dr. Vernard Gambles on 12/26/14. Later that evening patient developed mid sternal chest pain radiating to left shouder and cardiac enzymes as well as EKG was done for workup. CTA chest was negative for PTX or PE and small amount of air seen anterior due to procedure.     Past Medical History  Past Medical History  Diagnosis Date  . Atrial fibrillation, chronic     on Xarelto   . HTN (hypertension)   . CHF (congestive heart failure)   . Diabetes mellitus   . 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  Prior Rehab/Hospitalizations: CIR 12/17/14 through 12/27/14  Current Medications   Current facility-administered medications:  .  acetaminophen (TYLENOL) tablet 650 mg, 650 mg, Oral, Q6H PRN, 650 mg at 12/29/14 1036 **OR** acetaminophen (TYLENOL) suppository 650 mg, 650 mg, Rectal, Q6H PRN, Rise Patience, MD .  amiodarone (PACERONE) tablet 100 mg, 100 mg, Per Tube, Daily, Rise Patience, MD, 100 mg at 12/29/14 1036 .  artificial tears (LACRILUBE) ophthalmic ointment, , Both Eyes, Q4H PRN, Rise Patience, MD .  aspirin tablet 325 mg, 325 mg, Oral, Daily, Barton Dubois, MD, 325 mg at 12/29/14 1036 .  atorvastatin (LIPITOR) tablet 40 mg, 40 mg, Per Tube, Daily, Rise Patience, MD, 40 mg at 12/29/14 1036 .  chlorhexidine (PERIDEX) 0.12 % solution 15 mL, 15 mL, Mouth Rinse,  BID, Rise Patience, MD, 15 mL at 12/29/14 0853 .  famotidine (PEPCID) tablet 40 mg, 40 mg, Oral, Daily, Barton Dubois, MD, 40 mg at 12/29/14 1036 .  feeding supplement (JEVITY 1.2 CAL) liquid 1,000 mL, 1,000 mL, Per Tube, Continuous, Barton Dubois, MD, Last Rate: 65 mL/hr at 12/29/14 0359, 1,000 mL at 12/29/14 0359 .  free water 200 mL, 200 mL, Per Tube, 6 times per day, Rise Patience, MD, 200 mL at 12/29/14 1246 .  insulin aspart (novoLOG) injection 0-15 Units, 0-15 Units, Subcutaneous, 6 times per day, Ritta Slot, NP, 8 Units at 12/29/14 1243 .  ipratropium (ATROVENT) nebulizer solution 0.5 mg, 0.5 mg, Inhalation, Q6H PRN, Rise Patience, MD, 0.5 mg at 12/28/14 2059 .  metoprolol tartrate (LOPRESSOR) tablet 12.5 mg, 12.5 mg, Per Tube, Daily, Rise Patience, MD, 12.5 mg at 12/29/14 1036 .  morphine 2 MG/ML injection 1-2 mg, 1-2 mg, Intravenous, Q4H PRN, Barton Dubois, MD, 2 mg at 12/29/14 0853 .  ondansetron (ZOFRAN) tablet 4 mg, 4 mg, Oral, Q6H PRN **OR** ondansetron (ZOFRAN) injection 4 mg, 4 mg, Intravenous, Q6H PRN, Rise Patience, MD .  predniSONE (DELTASONE) tablet 60 mg, 60 mg, Per Tube, Q breakfast, Rise Patience, MD, 60 mg at 12/29/14 0848 .  pyridostigmine (MESTINON) tablet 90 mg, 90 mg, Per NG tube, QID, Rise Patience, MD, 90 mg at 12/29/14 1035 .  rivaroxaban (XARELTO) tablet 20 mg, 20 mg, Oral, Q supper, Rise Patience, MD, 20 mg at 12/28/14 1706 .  sodium chloride 0.9 % injection 10-40 mL, 10-40 mL, Intracatheter, Q12H, Rise Patience, MD, 10 mL at 12/29/14 1037 .  sodium chloride 0.9 % injection 10-40 mL, 10-40 mL, Intracatheter, PRN, Rise Patience, MD, 10 mL at 12/28/14 1133 .  sodium chloride 0.9 % injection 3 mL, 3 mL, Intravenous, Q12H, Rise Patience, MD, 3 mL at 12/29/14 1037  Patients Current Diet: NPO with jevity feeds vai PEG at 65 cc/hr  Precautions / Restrictions Precautions Precautions: Fall, Other  (comment) Precaution Comments: O2 via [place]; peg Restrictions Weight Bearing Restrictions: No Other Position/Activity Restrictions: HOB elevated due to continuous tube feeds.   Prior Activity Level Community (5-7x/wk): actvie, driving, doing self care   Home Equities trader / [place] Devices/Equipment: CBG Meter, Cane (specify quad or straight), BIPAP Home Equipment: Walker - 2 wheels, Cane - single point, Bedside commode  Prior Functional Level Prior Function Level of Independence: Independent with assistive device(s) Comments: Enjoys doing woodworking. Used SPC PTA on days he "wasn't feeling great"  Current Functional Level Cognition  Arousal/Alertness: Awake/alert Overall Cognitive Status: Within Functional Limits for tasks assessed Orientation Level: Oriented X4    Extremity Assessment (includes Sensation/Coordination)  Upper Extremity Assessment: RUE deficits/detail, LUE deficits/detail RUE Deficits / Details:  - @ 2+/5; shoudler FF to 80; PROM WFL RUE Coordination: decreased gross motor LUE Deficits / Details: 3/5 shoulder; 4/5 elbow; 4+/5 hand. weak scapular muscles - @ 3/5 LUE Coordination: decreased gross motor  Lower Extremity Assessment: Defer to PT evaluation RLE Deficits / Details: hip flex 3/5, knee flex/ ext 3/5 weakness began with MG flare, LLE WFL    ADLs  Overall ADL's : Needs assistance/impaired Eating/Feeding: NPO Eating/Feeding Details (indicate cue type and reason): peg placemetn Grooming: Minimal assistance Grooming Details (indicate cue type and reason): Pt with posterior lean when releasing RW with BUE requiring min A to compensate for LOB Upper Body Bathing: Minimal assitance, Sitting Lower Body Bathing: Minimal assistance, Sit to/from stand Lower Body Bathing Details (indicate cue type and reason): difficulty with releasing RW and maintinaing balance with peri care Upper Body Dressing : Moderate assistance Lower Body Dressing:  Moderate assistance, Sit to/from stand Toilet Transfer: Minimal assistance, Ambulation, Stand-pivot Toileting- Clothing Manipulation and Hygiene: Minimal assistance, Sit to/from stand Functional mobility during ADLs: Minimal assistance, Rolling walker, Cueing for safety General ADL Comments: core and BUE  weakness and balance deficits affecting ADL status    Mobility  Overal bed mobility: Needs Assistance Bed Mobility: Supine to Sit, Sit to Supine Supine to sit: Min assist, Mod assist General bed mobility comments: pt up in chair    Transfers  Overall transfer level: Needs assistance Equipment used: Rolling walker (2 wheeled) Transfers: Sit to/from Stand Sit to Stand: Min assist General transfer comment: 2 rys to trasition from sit - stand from lower suface with min A    Ambulation / Gait / Stairs / Wheelchair Mobility  Ambulation/Gait Ambulation/Gait assistance: Museum/gallery curator (Feet): 35 Feet Assistive device: Rolling walker (2 wheeled) Gait Pattern/deviations: Step-through pattern, Decreased step length - right, Decreased step length - left, Trunk flexed Gait velocity interpretation: Below normal speed for age/gender General Gait Details: Vc's for upright posture. Pt. stated he could not hold his neck up. Noted kinesiotape in place.  Pt ambulated with RW with cues for steering RW as well as cues for technique.  Decr safety at times needing cues for use of [place].  Pt desats with activity needing 4LO2 to keep sats >90%.  Poor endurance at present but pt does push himself.      Posture / Balance Balance Overall balance assessment: Needs assistance, History of Falls Sitting-balance support: No upper extremity supported, Feet supported Sitting balance-Leahy Scale: Fair Standing balance support: Bilateral upper extremity supported, During functional activity Standing balance-Leahy Scale: Poor Standing balance comment: posterior lean without BUE assist High level balance  activites: Direction changes, Turns, Sudden stops High Level Balance Comments: All of the above challenges with RW with min to mod assist.  Unsteady with challenges.      Special needs/care consideration BiPAP/CPAP yes Continuous Drip IV no Oxygen O 2 at 2 liters [place]                             Bowel mgmt:last BM 12/25/14 incontinent Bladder mgmt: foley Diabetic mgmt yes [facility]N, [place], met with pt 12/12/14 and plans to follow up after d/c. [phone]       Wife requesting DME for d/c of oxygen concentrator at home, pill crusher,  tub bench, yonker suction, and tube feeding  Previous Home Environment Living Arrangements: Spouse/significant other  Lives With: Spouse Available Help at Discharge: Family, Available 24 hours/day Type of Home: House Home Layout: One level, Laundry or work area in basement Home Access: Stairs to enter Entrance Stairs-Rails: Building surveyor of Steps: 3 Bathroom Shower/Tub: Chiropodist: Standard Bathroom Accessibility: Yes How Accessible: Accessible via walker [place]: No Additional Comments: wife reports that if he goes home, they will probably need tub bench  Discharge Living Setting Plans for Discharge Living Setting: Patient's home, Lives with (comment), Other (Comment) Type of Home at Discharge: House Discharge Home Layout: One level Discharge Home Access: Stairs to enter Entrance Stairs-Rails: Right Entrance Stairs-Number of Steps: 3 Discharge Bathroom Shower/Tub: Tub/shower unit Discharge Bathroom Toilet: Standard Discharge Bathroom Accessibility: Yes How Accessible: Accessible via walker Does the patient have any problems obtaining your medications?: No  Social/Family/Support Systems Patient Roles: Spouse, Parent Contact Information: Luster Landsberg, wife Anticipated Caregiver: wife Anticipated Caregiver's Contact Information: see above Ability/Limitations of Caregiver: no limitations Caregiver  Availability: 24/7 Discharge Plan Discussed with Primary Caregiver: Yes Is Caregiver In Agreement with Plan?: Yes Does Caregiver/Family have Issues with Lodging/Transportation while Pt is in Rehab?: No   Goals/Additional Needs Patient/Family Goal for Rehab: Mod I to supervision with PT and OT, supervision to min assist SLP Expected length of stay: ELOS 5 to 7 days Equipment Needs: New Peg Pt/Family Agrees to Admission and willing to participate: Yes Program Orientation Provided & Reviewed with Pt/Caregiver Including Roles  & Responsibilities: Yes   Decrease burden of Care through IP rehab admission: n/a  Possible need for SNF placement upon discharge:no  Patient Condition: This patient's condition was discussed with Dr. Naaman Plummer on 12/29/14 at 31, in which the Rehabilitation Physician determined  that the patient's condition is appropriate for intensive rehabilitative care in an inpatient rehabilitation facility readmission after his acute medical issues have been resolved.He returned to acute on 12/26/14 and now medically ready to readmit to inpt rehab 12/29/14.  Will admit to inpatient rehab today.  Preadmission Screen Completed By:  Cleatrice Burke, 12/29/2014 1:14 PM ______________________________________________________________________   Discussed status with Dr. Naaman Plummer on 12/29/14 at  1621 and received telephone approval for admission today.  Admission Coordinator:  Cleatrice Burke, time 3474 Date 12/29/2014

## 2014-12-29 NOTE — Progress Notes (Signed)
Patient ID: Craig [name], male   DOB: 09-19-1941, 74 y.o.   MRN: [id number] Patient admitted to 4W20 via bed, escorted by nursing staff and spouse.  Patient and spouse declined going over rehab process, since patient was just here, and had already received this information.  Patient appears to be in no immediate distress at this time.  Will continue to monitor.  Brita Romp, RN

## 2014-12-29 NOTE — Progress Notes (Signed)
SATURATION QUALIFICATIONS: (This note is used to comply with regulatory documentation for home oxygen)  Patient Saturations on Room Air at Rest = 91-92%  Patient Saturations on Room Air while Ambulating = 84-87%  Patient Saturations on 4 Liters of oxygen while Ambulating = 91-92%  Please briefly explain why patient needs home oxygen:Pt desats without O2 with activity.  May need home O2.  Thanks.   [place] [phone] (pager)

## 2014-12-30 ENCOUNTER — Inpatient Hospital Stay (HOSPITAL_COMMUNITY): Payer: PPO | Admitting: Speech Pathology

## 2014-12-30 ENCOUNTER — Inpatient Hospital Stay (HOSPITAL_COMMUNITY): Payer: PPO

## 2014-12-30 ENCOUNTER — Inpatient Hospital Stay (HOSPITAL_COMMUNITY): Payer: PPO | Admitting: Occupational Therapy

## 2014-12-30 LAB — COMPREHENSIVE METABOLIC PANEL
ALT: 49 U/L (ref 0–53)
ANION GAP: 11 (ref 5–15)
AST: 27 U/L (ref 0–37)
Albumin: 2.4 g/dL — ABNORMAL LOW (ref 3.5–5.2)
Alkaline Phosphatase: 67 U/L (ref 39–117)
BILIRUBIN TOTAL: 0.9 mg/dL (ref 0.3–1.2)
BUN: 16 mg/dL (ref 6–23)
CO2: 31 mmol/L (ref 19–32)
CREATININE: 0.64 mg/dL (ref 0.50–1.35)
Calcium: 8.6 mg/dL (ref 8.4–10.5)
Chloride: 99 mmol/L (ref 96–112)
GFR calc Af Amer: >90 mL/min (ref >90)
Glucose, Bld: 172 mg/dL — ABNORMAL HIGH (ref 70–99)
Potassium: 3 mmol/L — ABNORMAL LOW (ref 3.5–5.1)
SODIUM: 141 mmol/L (ref 135–145)
Total Protein: 5.2 g/dL — ABNORMAL LOW (ref 6.0–8.3)

## 2014-12-30 LAB — CBC WITH DIFFERENTIAL/PLATELET
BASOS ABS: 0 10*3/uL (ref 0.0–0.1)
Basophils Relative: 0 % (ref 0–1)
EOS PCT: 0 % (ref 0–5)
Eosinophils Absolute: 0 10*3/uL (ref 0.0–0.7)
HEMATOCRIT: 38.8 % — AB (ref 39.0–52.0)
HEMOGLOBIN: 13.1 g/dL (ref 13.0–17.0)
Lymphocytes Relative: 12 % (ref 12–46)
Lymphs Abs: 1.5 10*3/uL (ref 0.7–4.0)
MCH: 31.4 pg (ref 26.0–34.0)
MCHC: 33.8 g/dL (ref 30.0–36.0)
MCV: 93 fL (ref 78.0–100.0)
MONOS PCT: 8 % (ref 3–12)
Monocytes Absolute: 1 10*3/uL (ref 0.1–1.0)
Neutro Abs: 9.5 10*3/uL — ABNORMAL HIGH (ref 1.7–7.7)
Neutrophils Relative %: 80 % — ABNORMAL HIGH (ref 43–77)
Platelets: 238 10*3/uL (ref 150–400)
RBC: 4.17 MIL/uL — ABNORMAL LOW (ref 4.22–5.81)
RDW: 14.4 % (ref 11.5–15.5)
WBC: 12 10*3/uL — ABNORMAL HIGH (ref 4.0–10.5)

## 2014-12-30 LAB — URINALYSIS, ROUTINE W REFLEX MICROSCOPIC
Bilirubin Urine: NEGATIVE
GLUCOSE, UA: NEGATIVE mg/dL
HGB URINE DIPSTICK: NEGATIVE
KETONES UR: NEGATIVE mg/dL
Nitrite: NEGATIVE
PROTEIN: 30 mg/dL — AB
Specific Gravity, Urine: 1.029 (ref 1.005–1.030)
Urobilinogen, UA: 1 mg/dL (ref 0.0–1.0)
pH: 6 (ref 5.0–8.0)

## 2014-12-30 LAB — GLUCOSE, CAPILLARY
GLUCOSE-CAPILLARY: 234 mg/dL — AB (ref 70–99)
GLUCOSE-CAPILLARY: 186 mg/dL — AB (ref 70–99)
Glucose-Capillary: 163 mg/dL — ABNORMAL HIGH (ref 70–99)
Glucose-Capillary: 121 mg/dL — ABNORMAL HIGH (ref 70–99)
Glucose-Capillary: 196 mg/dL — ABNORMAL HIGH (ref 70–99)

## 2014-12-30 LAB — URINE MICROSCOPIC-ADD ON

## 2014-12-30 MED ORDER — POTASSIUM CHLORIDE 20 MEQ/15ML (10%) PO SOLN
20.0000 meq | Freq: Two times a day (BID) | ORAL | Status: DC
  Administered 2014-12-30 – 2015-01-01 (×4): 20 meq
  Filled 2014-12-30 (×6): qty 15

## 2014-12-30 MED ORDER — JEVITY 1.2 CAL PO LIQD
390.0000 mL | Freq: Three times a day (TID) | ORAL | Status: DC
  Administered 2014-12-30 – 2015-01-02 (×10): 390 mL
  Filled 2014-12-30 (×17): qty 474

## 2014-12-30 MED ORDER — INSULIN ASPART 100 UNIT/ML ~~LOC~~ SOLN
0.0000 [IU] | Freq: Three times a day (TID) | SUBCUTANEOUS | Status: DC
  Administered 2014-12-30: 5 [IU] via SUBCUTANEOUS
  Administered 2014-12-30 (×2): 3 [IU] via SUBCUTANEOUS
  Administered 2014-12-31: 5 [IU] via SUBCUTANEOUS
  Administered 2014-12-31 (×2): 8 [IU] via SUBCUTANEOUS
  Administered 2015-01-01: 3 [IU] via SUBCUTANEOUS
  Administered 2015-01-01: 8 [IU] via SUBCUTANEOUS

## 2014-12-30 MED ORDER — PRO-STAT SUGAR FREE PO LIQD
30.0000 mL | Freq: Every day | ORAL | Status: DC
  Administered 2014-12-30 – 2015-01-02 (×4): 30 mL
  Filled 2014-12-30 (×6): qty 30

## 2014-12-30 MED ORDER — BACITRACIN ZINC 500 UNIT/GM EX OINT
TOPICAL_OINTMENT | Freq: Two times a day (BID) | CUTANEOUS | Status: DC
  Administered 2014-12-30 – 2015-01-02 (×7): via TOPICAL
  Filled 2014-12-30: qty 28.35

## 2014-12-30 MED ORDER — FAMOTIDINE 40 MG/5ML PO SUSR
40.0000 mg | Freq: Every day | ORAL | Status: DC
  Administered 2014-12-31 – 2015-01-02 (×3): 40 mg via ORAL
  Filled 2014-12-30 (×4): qty 5

## 2014-12-30 MED ORDER — FREE WATER
150.0000 mL | Freq: Three times a day (TID) | Status: DC
  Administered 2014-12-30 – 2015-01-02 (×11): 150 mL

## 2014-12-30 MED ORDER — JEVITY 1.2 CAL PO LIQD
237.0000 mL | Freq: Three times a day (TID) | ORAL | Status: DC
  Filled 2014-12-30 (×5): qty 237

## 2014-12-30 MED ORDER — FREE WATER: 200.0000 mL | Freq: Three times a day (TID) | Status: DC

## 2014-12-30 MED ORDER — HEPARIN SOD (PORK) LOCK FLUSH 100 UNIT/ML IV SOLN
500.0000 [IU] | INTRAVENOUS | Status: AC | PRN
  Administered 2014-12-30: 500 [IU]

## 2014-12-30 MED ORDER — NATEGLINIDE 60 MG PO TABS
60.0000 mg | ORAL_TABLET | Freq: Three times a day (TID) | ORAL | Status: DC
  Administered 2014-12-30 – 2015-01-01 (×5): 60 mg
  Filled 2014-12-30 (×8): qty 1

## 2014-12-30 MED ORDER — METOPROLOL TARTRATE 25 MG/10 ML ORAL SUSPENSION
12.5000 mg | Freq: Two times a day (BID) | ORAL | Status: DC
  Administered 2014-12-30 – 2015-01-02 (×6): 12.5 mg via ORAL
  Filled 2014-12-30 (×11): qty 5

## 2014-12-30 NOTE — Evaluation (Signed)
Speech Language Pathology Assessment and Plan  Patient Details  Name: Craig [name] MRN: [id number] Date of Birth: [birth year]  SLP Diagnosis: Dysarthria;Cognitive Impairments  Rehab Potential: Good ELOS: 01/02/13    Today's Date: 12/30/2014 SLP Individual Time: 1450-1550 SLP Individual Time Calculation (min): 60 min   Problem List:  Patient Active Problem List   Diagnosis Date Noted  . MG with exacerbation (myasthenia gravis) 12/29/2014  . Protein-calorie malnutrition, severe   . Esophageal reflux   . Chest pain 12/27/2014  . Chronic atrial fibrillation 12/27/2014  . Pain in the chest   . Diaphoresis   . Bradycardia   . Dysphagia   . MG, crisis (myasthenia gravis) 12/17/2014  . Dysphagia, pharyngoesophageal phase   . Enteritis due to Clostridium difficile   . Clostridium difficile diarrhea 12/15/2014  . Hypernatremia   . Acute exacerbation of myasthenia gravis   . Myasthenia gravis in crisis 12/05/2014  . Bladder cancer 12/05/2014  . HTN (hypertension) 12/05/2014  . A-fib 12/05/2014  . Chronic anticoagulation 12/05/2014  . Acute respiratory failure with hypoxia 12/05/2014   Past Medical History:  Past Medical History  Diagnosis Date  . Atrial fibrillation, chronic     on Xarelto   . HTN (hypertension)   . CHF (congestive heart failure)   . Diabetes mellitus   . 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 Abdominal wall metastasis removal      Assessment / Plan / Recommendation Clinical Impression Patient is a 74 y.o. male with history of A fib, metastatic bladder cancer, recent PNA, MG who with worsening over past months. He was evaluated by neurology 3 days PTA and was placed on steroids with increase in mestinon due to difficulty swallowing and SOB. Patient continued to worsen and was admitted via [place] on 12/05/14 with SOB, swallowing  difficulty, difficulty hold head up with droopy eyelids and congestion. He was intubated due to respiratory distress and started on IV solumedrol as well as plasmapheresis per input by Dr. Doy Mince.  He has responded to treatment and tolerated extubation on 01/25 with use of BIPAP prn. He is able to suction oral secretions and is currently NPO with tube feeds for nutritional support. He was treated with PLEX X 6 days---last on 12/15/14 with reports of improvement in SOB.  Neurology recommends IVIG X 3 if patient declines or does not show further improvement. ST has been working of trials of ice chips. NIF/VC not checked in past couple of days as equipment unavailable. He was started on flagyl for c diff colitis with diarrhea and completes treatment on 02/10. Reactive leucocytosis is resolving.  Hypernatremia and hypokalemia resolving with treatment and  Xarelto resumed.  Therapy ongoing with recommendations for CIR.  Patient felt to have completed his therapy for MG crises and cleared for comprehensive inpatient rehab program. He was transferred to [facility] 12/17/14 and had been making good progress in therapies with improvement in activity tolerance, decrease secretions as well as improvement in oxygenation.  MBS on 12/25/14 showed poor recovery of swallow function therefore PEG was placed by Dr. Vernard Gambles on 12/26/14. Later that evening patient developed mid sternal chest pain radiating to left shoulder; patient discharged back to acute for full work up. Patient stable and returned to CIR 12/29/14 to complete therapies. Orders received; speech an bedside swallow evaluations completed.  Patient demonstrates generalized oral weakness which impacts his overall  speech intelligibility and ability to orally manipulate and transit ice chip trials. Patient does demonstrate improved ability to hold head up right, perform a bilabial seal and cough strength. Despite, continued immediate cough with ice chip trials.  Patient would  benefit from skilled SLP intervention to maximize his speech intelligibility and swallowing function during his rehab stay.    Skilled Therapeutic Interventions          Bedside swallow and speech evaluations completed with results and recommendations reviewed with patient and family.     SLP Assessment  Patient will need skilled Speech Lanaguage Pathology Services during CIR admission    Recommendations  Diet Recommendations: Alternative means - long-term;NPO Medication Administration: Via alternative means Oral Care Recommendations: Oral care Q4 per protocol Patient destination: Home Follow up Recommendations: Outpatient SLP;Home Health SLP;24 hour supervision/assistance Equipment Recommended: Other (comment) (suctioning)    SLP Frequency 3 to 5 out of 7 days   SLP Treatment/Interventions Environmental controls;Dysphagia/aspiration precaution training;Cueing hierarchy;Functional tasks;Internal/external aids;Patient/family education;Therapeutic Activities    Pain Pain Assessment Pain Assessment: No/denies pain Pain Score: 6  Pain Type: Acute pain Pain Location: Abdomen Pain Orientation: Left;Upper Pain Descriptors / Indicators: Aching;Sore Pain Frequency: Intermittent Pain Onset: On-going Patients Stated Pain Goal: 2 Pain Intervention(s): Medication (See eMAR) Prior Functioning Cognitive/Linguistic Baseline: Within functional limits Type of Home: House  Lives With: Spouse Available Help at Discharge: Family;Available 24 hours/day Vocation: Retired  Industrial/product designer Term Goals: Week 1: SLP Short Term Goal 1 (Week 1): short term goals = long term goals  See FIM for current functional status Refer to Care Plan for Long Term Goals  Recommendations for other services: None  Discharge Criteria: Patient will be discharged from SLP if patient refuses treatment 3 consecutive times without medical reason, if treatment goals not met, if there is a change in medical status, if patient makes no  progress towards goals or if patient is discharged from hospital.  The above assessment, treatment plan, treatment alternatives and goals were discussed and mutually agreed upon: by patient and by family  Carmelia Roller., CCC-SLP [phone]  [place] 12/30/2014, 8:36 PM

## 2014-12-30 NOTE — Progress Notes (Addendum)
Patient reported that abdomen was feeling better with therapy. Discussed medications with wife. ASA d/c as is not home medication. Blood pressures controlled off lasix and no signs of overload noted. Will resume home starlix at lower dose and doubt insulin needs past discharge.

## 2014-12-30 NOTE — Evaluation (Signed)
Physical Therapy Assessment and Plan  Patient Details  Name: Craig [name] MRN: [id number] Date of Birth: 12/14/1940  PT Diagnosis: Abnormal posture, Abnormality of gait, Difficulty walking and Muscle weakness Rehab Potential: Good ELOS: 5-7 days   Today's Date: 12/30/2014 PT Individual Time: 1000-1100 PT Individual Time Calculation (min): 60 min    Problem List:  Patient Active Problem List   Diagnosis Date Noted  . MG with exacerbation (myasthenia gravis) 12/29/2014  . Protein-calorie malnutrition, severe   . Esophageal reflux   . Chest pain 12/27/2014  . Chronic atrial fibrillation 12/27/2014  . Pain in the chest   . Diaphoresis   . Bradycardia   . Dysphagia   . MG, crisis (myasthenia gravis) 12/17/2014  . Dysphagia, pharyngoesophageal phase   . Enteritis due to Clostridium difficile   . Clostridium difficile diarrhea 12/15/2014  . Hypernatremia   . Acute exacerbation of myasthenia gravis   . Myasthenia gravis in crisis 12/05/2014  . Bladder cancer 12/05/2014  . HTN (hypertension) 12/05/2014  . A-fib 12/05/2014  . Chronic anticoagulation 12/05/2014  . Acute respiratory failure with hypoxia 12/05/2014    Past Medical History:  Past Medical History  Diagnosis Date  . Atrial fibrillation, chronic     on Xarelto   . HTN (hypertension)   . CHF (congestive heart failure)   . Diabetes mellitus   . 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 Abdominal wall metastasis removal      Assessment & Plan Clinical Impression: Craig [name] is a 74 y.o. male with history of A fib, metastatic bladder cancer, recent PNA, MG who has had worsening over past months. He was evaluated by neurology 3 days PTA and was placed on steroids with increase in  mestinon due to difficulty swallowing and SOB. Patient continued to worsen and was admitted via [place] on 12/05/14  with SOB, swallowing difficulty, difficulty hold head up with droopy eyelids and congestion. s. He was intubated due to respiratory distress and started on IV solumedrol as well as plasmapheresis per input by Dr. Doy Mince.  He has responded to treatment and tolerated extubation on 01/25 with use of BIPAP prn. He is able to suction oral secretions and is currently NPO with tube feeds for nutritional support. He was treated with PLEX X 6 days with improvement in SOB. He was transferred to [facility] 12/17/14 and has been making good progress with in therapy with improvement in activity tolerance, decrease secretions as well as improvement in oxygenation. Repeat MBS showed poor recovery of swallow function therefore PEG was placed by Dr. Vernard Gambles on 12/26/14.  Later that evening patient developed mid sternal chest pain radiating to left shouder and cardiac enzymes as well as EKG was done for workup.  CTA chest was negative for PTX, effusions or PE and small amount of air seen anterior due to procedure.    Patient currently requires min with mobility secondary to muscle weakness and decreased cardiorespiratoy endurance and decreased oxygen support.  Prior to hospitalization, patient was modified independent  with mobility and lived with Spouse in a House home.  Home access is 3Stairs to enter.  Patient will benefit from skilled PT intervention to maximize safe functional mobility, minimize fall risk and decrease caregiver burden for planned discharge home with intermittent assist.  Anticipate patient will benefit from follow up [facility] at discharge.     Skilled Therapeutic  Intervention Pt received seated in w/c, agreeable to participate in therapy. Session focused on functional evaluation. Pt familiar to this therapist from prior rehab stint before discharging to acute care last week. Pt's mobility is currently at overall [employer]-supervision level. Pt may benefit from supplemental oxygen at discharge due to desaturation  with activity to 87-88% without immediate recovery. Pt able to ambulate up to 50' at a time w/ [facility] before needing to rest due to decreased endurance. Initiated assessment of balance w/ Berg Balance Test, however limited by time and pt's activity tolerance and unable to complete last three tasks. Pt left seated in recliner on 2L O2 via [place] w/ all needs within reach.   PT Evaluation Precautions/Restrictions Precautions Precautions: Fall;Other (comment) Precaution Comments: de-sats with activity, PEG tube Restrictions Weight Bearing Restrictions: No General Chart Reviewed: Yes Vital SignsTherapy Vitals Temp: 97.7 F (36.5 C) Temp Source: Axillary Pulse Rate: 69 Resp: 18 BP: 111/63 mmHg Patient Position (if appropriate): Lying Oxygen Therapy SpO2: 95 % O2 Device: Bi-PAP Pain Pain Assessment Pain Assessment: No/denies pain Pain Score: 0-No pain Home Living/Prior Functioning Home Living Available Help at Discharge: Family;Available 24 hours/day Type of Home: House Home Access: Stairs to enter [employer] of Steps: 3 Entrance Stairs-Rails: Right Home Layout: One level;Laundry or work area in [employer] With: Spouse Prior Function Level of Independence: Requires assistive device for independence  Able to Take Stairs?: Yes 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 Used [employer]PC PTA on days he "wasn't feeling great" Vision/Perception     Cognition Overall Cognitive Status: Within Functional Limits for tasks assessed Arousal/Alertness: Awake/alert Orientation Level: Oriented X4 Sensation Sensation Light Touch: Appears Intact Coordination Gross Motor Movements are Fluid and Coordinated: Yes Fine Motor Movements are Fluid and Coordinated: Yes Motor  Motor Motor: Abnormal postural alignment and control Motor - Skilled Clinical Observations: decreased strength in head-neck   Mobility Bed Mobility Bed Mobility: Supine to Sit Supine to Sit: 5: Supervision Transfers Sit to Stand: 5: Supervision Stand to Sit: 5: Supervision Locomotion  Ambulation Ambulation: Yes Ambulation/Gait Assistance: 4: Min guard;5: Supervision Ambulation Distance (Feet): 50 Feet Assistive device: Straight cane Ambulation/Gait Assistance Details: Tactile cues for posture Ambulation/Gait Assistance Details: +2 for w/c follow Gait Gait: Yes Gait Pattern: Shuffle;Lateral trunk lean to left Gait velocity: decreased Stairs / Additional Locomotion Stairs: Yes Stairs Assistance: 4: Min guard Stair Management Technique: One rail Right;With cane Number of Stairs: 3 Wheelchair Mobility Wheelchair Mobility: No  Trunk/Postural Assessment  Cervical Assessment Cervical Assessment:  (decreased cervical extension strength, decreased ROM overall) Thoracic Assessment Thoracic Assessment:  (increased kyphosis) Lumbar Assessment Lumbar Assessment:  (decreased lordosis)  Balance Balance Balance Assessed: Yes Standardized Balance Assessment Standardized Balance Assessment: Berg Balance Test Berg Balance Test Sit to Stand: Able to stand  independently using hands Standing Unsupported: Able to stand safely 2 minutes Sitting with Back Unsupported but Feet Supported on Floor or Stool: Able to sit safely and securely 2 minutes Stand to Sit: Sits safely with minimal use of hands Transfers: Able to transfer safely, definite need of hands Standing Unsupported with Eyes Closed: Able to stand 10 seconds with supervision Standing Ubsupported with Feet Together: Able to place feet together independently and stand for 1 minute with supervision From Standing, Reach Forward with Outstretched Arm: Can reach forward >5 cm safely (2") From Standing Position, Pick up Object from Floor: Able to pick up shoe, needs supervision From Standing Position, Turn to Look Behind Over  each Shoulder: Turn sideways only  but maintains balance Extremity Assessment      RLE Assessment RLE Assessment:  (Overall 3+ to 4+/5) LLE Assessment LLE Assessment:  (Overall 3+ to 4+/5)  FIM:  FIM - Bed/Chair Transfer Bed/Chair Transfer: 4: Bed > Chair or W/C: Min A (steadying Pt. > 75%);4: Chair or W/C > Bed: Min A (steadying Pt. > 75%) FIM - Locomotion: Wheelchair Locomotion: Wheelchair: 0: Activity did not occur FIM - Locomotion: Ambulation Locomotion: Ambulation Assistive Devices: Journalist, newspaper Ambulation/Gait Assistance: 4: Min guard;5: Supervision Locomotion: Ambulation: 2: Travels 50 - 149 ft with minimal assistance (Pt.>75%) FIM - Locomotion: Stairs Locomotion: Scientist, physiological: Insurance account manager - 1;Cane Locomotion: Stairs: 1: Up and Down < 4 stairs with minimal assistance (Pt.>75%)   Refer to Care Plan for Long Term Goals  Recommendations for other services: None  Discharge Criteria: Patient will be discharged from PT if patient refuses treatment 3 consecutive times without medical reason, if treatment goals not met, if there is a change in medical status, if patient makes no progress towards goals or if patient is discharged from hospital.  The above assessment, treatment plan, treatment alternatives and goals were discussed and mutually agreed upon: by patient and by family  Rada Hay 12/30/2014, 10:14 AM

## 2014-12-30 NOTE — Progress Notes (Signed)
Social Work Assessment and Plan  Patient Details  Name: Craig [name] MRN: [id number] Date of Birth: May 18, 1941  Today's Date: 12/30/2014  Problem List:  Patient Active Problem List   Diagnosis Date Noted  . MG with exacerbation (myasthenia gravis) 12/29/2014  . Protein-calorie malnutrition, severe   . Esophageal reflux   . Chest pain 12/27/2014  . Chronic atrial fibrillation 12/27/2014  . Pain in the chest   . Diaphoresis   . Bradycardia   . Dysphagia   . MG, crisis (myasthenia gravis) 12/17/2014  . Dysphagia, pharyngoesophageal phase   . Enteritis due to Clostridium difficile   . Clostridium difficile diarrhea 12/15/2014  . Hypernatremia   . Acute exacerbation of myasthenia gravis   . Myasthenia gravis in crisis 12/05/2014  . Bladder cancer 12/05/2014  . HTN (hypertension) 12/05/2014  . A-fib 12/05/2014  . Chronic anticoagulation 12/05/2014  . Acute respiratory failure with hypoxia 12/05/2014   Past Medical History:  Past Medical History  Diagnosis Date  . Atrial fibrillation, chronic     on Xarelto   . HTN (hypertension)   . CHF (congestive heart failure)   . Diabetes mellitus   . 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 Abdominal wall metastasis removal     Social History:  reports that he has never smoked. He does not have any smokeless tobacco history on file. He reports that he does not drink alcohol or use illicit drugs.  Family / Support Systems Marital Status: Married How Long?: 71 years Patient Roles: Spouse, Parent Spouse/Significant Other: Craig [name] - wife - [phone] Children: 2 dtrs and 1 son - Craig [name] - son - [phone] Other Supports: extended family Anticipated Caregiver: wife Ability/Limitations of Caregiver: no limitations Caregiver Availability: 24/7 Family Dynamics: close, supportive wife and  family  Social History Preferred language: English Religion:  Read: Yes Write: Yes Employment Status: Retired Date Retired/Disabled/Unemployed: 2006 Age Retired: 63 Public relations account executive Issues: none reported Guardian/Conservator: N/A   Abuse/Neglect Physical Abuse: Denies Verbal Abuse: Denies Sexual Abuse: Denies Exploitation of patient/patient's resources: Denies Self-Neglect: Denies  Emotional Status Pt's affect, behavior and adjustment status: Pt reports feeling well emotionally and mentally and that he is staying positive and has not experienced any signs/symptoms of anxiety/depression. Recent Psychosocial Issues: Pt returned to acute care after his PEG due to chest pain.  Pt is feeling better now. Psychiatric History: none reported Substance Abuse History: none reported  Patient / Family Perceptions, Expectations & Goals Pt/Family understanding of illness & functional limitations: Pt/wife have a good understanding of pt's condition and do not have any unanswered questions.  Wife has already seen an improvement in pt's condition.  She is glad pt is back on Rehab because he was not working on goals on acute. Premorbid pt/family roles/activities: Pt/wife have a large yard and like doing yard work together.  They also enjoy spending time with their 4 grandchildren. Anticipated changes in roles/activities/participation: Pt wants to get better so that he can spend time with his grandchildren. Pt/family expectations/goals: Pt wants to go home.  Getting home to his grandchildren is very motivating to him.  Community [employer] Agencies: None Premorbid Home Care/DME Agencies: Other (Comment) (Pt and wife have used [facility] in the past and they would like to use them again.  Pt has [facility] for his oxygen for his BiPap at  home, but they will need to switch to [facility] due to new insurance.) Transportation available at  discharge: family  Discharge Planning Living Arrangements: Spouse/significant other Support Systems: Spouse/significant other Type of Residence: Private residence Insurance Resources: Multimedia programmer (specify) ([employer]) Financial Resources: Radio broadcast assistant Screen Referred: No Money Management: Patient, Spouse Does the patient have any problems obtaining your medications?: No Home Management: Pt's wife is used to managing the inside of the home.  She shared outside chores with pt, but feels this is manageable.  Patient/Family Preliminary Plans: Pt plans to return to his home with his wife. Barriers to Discharge: Steps Social Work Anticipated Follow Up Needs: HH/OP Expected length of stay: ELOS 5 to 7 days  Clinical Impression CSW ran into pt and his wife in the hall as he was finishing his PT session.  Pt reported feeling better and is happy to have the tube gone from his nose.  Pt's wife is happy that pt is back on Rehab, as she feels they did not do anything to work with pt on his rehab while on acute.  She is ready and willing for pt to come home at the end of the week, as the therapists have been talking with her about pt not needing to stay on Rehab for very long.  Pt will need new prescription for oxygen, as he needs to change providers to use a company who is contracted with his insurance company.  Pt spoke with CSW a little more than he has, but still seemed weak and not wanting to converse much.  Pt's wife feels that pt is ready to get home.  CSW explained that we would discuss this in conference today and CSW would update them later.  She expressed understanding.  CSW will continue to follow and assist as needed.  Craig [name], Craig [name] 12/30/2014, 1:50 PM

## 2014-12-30 NOTE — Progress Notes (Signed)
[facility] PHYSICAL MEDICINE & REHABILITATION     PROGRESS NOTE    Subjective/Complaints:   Objective: Vital Signs: Blood pressure 111/63, pulse 69, temperature 97.7 F (36.5 C), temperature source Axillary, resp. rate 18, height 5\' 7"  (1.702 m), weight 106.4 kg (234 lb 9.1 oz), SpO2 95 %. Dg Chest Port 1 View  12/28/2014   CLINICAL DATA:  Cough and shortness of breath today.  EXAM: PORTABLE CHEST - 1 VIEW  COMPARISON:  View of the chest CT chest 12/27/2014.  FINDINGS: Right IJ catheter and left subclavian approach Port-A-Cath are again seen. Lung volumes are lower than on comparison plain film of the chest. The lungs appear clear. There is cardiomegaly. No pneumothorax or pleural effusion.  IMPRESSION: Cardiomegaly without acute disease.   Electronically Signed   By: Inge Rise M.D.   On: 12/28/2014 22:37    Recent Labs  12/29/14 0350 12/30/14 0550  WBC 15.8* 12.0*  HGB 13.6 13.1  HCT 40.4 38.8*  PLT 201 238    Recent Labs  12/29/14 0350 12/30/14 0550  NA 140 141  K 3.1* 3.0*  CL 99 99  GLUCOSE 197* 172*  BUN 12 16  CREATININE 0.68 0.64  CALCIUM 8.3* 8.6   CBG (last 3)   Recent Labs  12/29/14 2356 12/30/14 0400 12/30/14 0815  GLUCAP 150* 163* 121*    Wt Readings from Last 3 Encounters:  12/30/14 106.4 kg (234 lb 9.1 oz)  12/29/14 106.006 kg (233 lb 11.2 oz)  12/17/14 107.4 kg (236 lb 12.4 oz)    Physical Exam:  Constitutional: He appears well-developed and well-nourished. Appears fatigued still    HENT: BiBap mask secured, mild irritation over nose Head: Normocephalic and atraumatic.  Eyes: Left conjunctiva somewhat injected.  Ptosis improved  Cardiovascular: Normal rate and regular rhythm.  Respiratory: Effort normal. No respiratory distress. He has no wheezes. He has rhonchi.  GI: Soft. Bowel sounds are normal. He exhibits no distension. There is tenderness near g tube---synched down quite tightly Musculoskeletal: He exhibits no  edema or tenderness.  Neurological: He is alert.  Facial diplegia improving. Cough and oro-motor control is better..voice still lacks volume Followed motor commands without difficulty. Reasonable insight and awareness. Sensory exam intact to PP and LT. Strength 4/5 UE's bilaterally prox to distal. LE's4/5 hf, 4+/5ke, 4+ankles.  Skin: Skin is warm and dry.  Psychiatric: He has a normal mood and affect. His behavior is normal. Good insight and awareness   Assessment/Plan: 1. Functional deficits secondary to Myasthenia gravis which require 3+ hours per day of interdisciplinary therapy in a comprehensive inpatient rehab setting. Physiatrist is providing close team supervision and 24 hour management of active medical problems listed below. Physiatrist and rehab team continue to assess barriers to discharge/monitor patient progress toward functional and medical goals. FIM:                   Comprehension Comprehension Mode: Auditory Comprehension: 5-Understands complex 90% of the time/Cues < 10% of the time  Expression Expression Mode: Verbal Expression: 5-Expresses basic 90% of the time/requires cueing < 10% of the time.  Social Interaction Social Interaction: 6-Interacts appropriately with others with medication or extra time (anti-anxiety, antidepressant).  Problem Solving Problem Solving: 5-Solves complex 90% of the time/cues < 10% of the time  Memory Memory: 5-Recognizes or recalls 90% of the time/requires cueing < 10% of the time  Medical Problem List and Plan: 1. Functional deficits secondary to MG crises with respiratory failure.  2. DVT Prophylaxis/Anticoagulation:  Pharmaceutical: Xarelto 3. Pain Management: Tylenol prn 4. Mood: Team to provide ego support. LCSW to follow for evaluation and support.  5. Neuropsych: This patient is capable of making decisions on her own behalf. 6. Skin/Wound Care: routine pressure relief measures. Maintain adequate nutrition and  hydration status.  7. Fluids/Electrolytes/Nutrition: Continue TF. Will add questran for bulking. Continue water flushes every 4 hours. Will check lytes in am.  8. Chest pain with acute respiratory failure: Likely due to procedure with sedation superimposed on recent MG crises. Encourage IS  9. A fib: Monitor heart rate every 8 hours. Continue amiodarone and Xarelto.  10. Hypokalemia: continue supplement and recheck levels thursday.  12. MG crises: On prednisone 60 mg/daily and mestinon 90 mg qid (increased few days PTA). Monitor for recovery past PLEX.  13. Continued oro-pharyngeal dysphagia: NPO except for oral trials with ST. Continue tube feeds via PEG.   -may need to loosen flange on g tube 14. C diff colitis: Has completed treatment with resolution of diarrhea.   LOS (Days) 1 A FACE TO FACE EVALUATION WAS PERFORMED  SWARTZ,ZACHARY T 12/30/2014 8:44 AM

## 2014-12-30 NOTE — Progress Notes (Signed)
INITIAL NUTRITION ASSESSMENT  DOCUMENTATION CODES Per approved criteria  -Obesity Unspecified   INTERVENTION: Start bolus tube feeding of Jevity 1.2 via PEG at 150 ml and increase by 100 ml every 6 hours to goal of 390 ml 4 times daily with 30 ml Prostat once daily to provide 1972 kcals, 102 grams of protein, and 1264 ml of free water.  Provide free water flushes of 150 ml 4 times daily. Total daily water: 1864 ml.  Will continue to monitor.  NUTRITION DIAGNOSIS: Inadequate oral intake related to inability to eat as evidenced by NPO status.  Goal: Pt to meet >/= 90% of their estimated nutrition needs   Monitor:  Bolus TF rate and tolerance, weight trends, labs, I/O's  Reason for Assessment: MD consult for enteral/tube feeding initiation and management- bolus feeds  74 y.o. male  Admitting Dx: MG, crisis (myasthenia gravis)  ASSESSMENT: Pt with history of A fib, metastatic bladder cancer, recent PNA, MG who has had worsening over past months. Pt NPO on nutrition support due to poor swallow function. PEG placed 2/12.   RD consulted for transitioning to bolus tube feedings. Pt is familiar to this RD due to previous rehab admission. Pt has been tolerating his continuous tube feedings well. Pt to start bolus feeds today. Weight has been stable.   Pt with no observed significant fat or muscle mass loss.   Labs and medications reviewed.  Height: Ht Readings from Last 1 Encounters:  12/29/14 5\' 7"  (1.702 m)    Weight: Wt Readings from Last 1 Encounters:  12/30/14 234 lb 9.1 oz (106.4 kg)    Ideal Body Weight: 148 lbs  % Ideal Body Weight: 158%  Wt Readings from Last 10 Encounters:  12/30/14 234 lb 9.1 oz (106.4 kg)  12/29/14 233 lb 11.2 oz (106.006 kg)  12/17/14 236 lb 12.4 oz (107.4 kg)  05/03/10 290 lb (131.543 kg)    Usual Body Weight: 236 - 240 lbs  % Usual Body Weight: 99%  BMI:  Body mass index is 36.73 kg/(m^2). Class II obesity  Estimated Nutritional  Needs: Kcal: 1850-2050 Protein: 95-110 grams Fluid: >/= 1.8 L/day  Skin: +1 generalized edema  Diet Order:  NPO  EDUCATION NEEDS: -No education needs identified at this time   Intake/Output Summary (Last 24 hours) at 12/30/14 1049 Last data filed at 12/30/14 0946  Gross per 24 hour  Intake 1087.92 ml  Output    650 ml  Net 437.92 ml    Last BM: 2/16  Labs:   Recent Labs Lab 12/27/14 0800 12/29/14 0350 12/30/14 0550  NA 138 140 141  K 3.6 3.1* 3.0*  CL 99 99 99  CO2 31 33* 31  BUN 9 12 16   CREATININE 0.60 0.68 0.64  CALCIUM 8.3* 8.3* 8.6  GLUCOSE 123* 197* 172*    CBG (last 3)   Recent Labs  12/29/14 2356 12/30/14 0400 12/30/14 0815  GLUCAP 150* 163* 121*    Scheduled Meds: . amiodarone  100 mg Per Tube Daily  . aspirin  325 mg Per Tube Daily  . atorvastatin  40 mg Per Tube q1800  . bacitracin  1 application Topical BID  . chlorhexidine  15 mL Mouth Rinse BID  . [START ON 12/31/2014] famotidine  40 mg Oral Daily  . feeding supplement (JEVITY 1.2 CAL)  237 mL Per Tube TID WC & HS  . free water  200 mL Per Tube TID PC & HS  . insulin aspart  0-15 Units Subcutaneous  TID AC & HS  . metoprolol tartrate  12.5 mg Oral BID  . potassium chloride  20 mEq Per Tube BID  . predniSONE  60 mg Per Tube Q breakfast  . pyridostigmine  90 mg Per NG tube QID  . rivaroxaban  20 mg Oral Q supper  . sodium chloride  10-40 mL Intracatheter Q12H    Continuous Infusions:   Past Medical History  Diagnosis Date  . Atrial fibrillation, chronic     on Xarelto   . HTN (hypertension)   . CHF (congestive heart failure)   . Diabetes mellitus   . 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 Abdominal wall metastasis removal      Kallie Locks, MS, RD, LDN Pager # [phone] After hours/ weekend pager # [phone]

## 2014-12-30 NOTE — Evaluation (Signed)
Occupational Therapy Assessment and Plan  Patient Details  Name: Craig [name] MRN: [id number] Date of Birth: 1941-09-30  OT Diagnosis: muscle weakness (generalized) Rehab Potential: Rehab Potential (ACUTE ONLY): Good ELOS: 4-5 days   Today's Date: 12/30/2014 OT Individual Time: 0900-1000 OT Individual Time Calculation (min): 60 min     Problem List:  Patient Active Problem List   Diagnosis Date Noted  . MG with exacerbation (myasthenia gravis) 12/29/2014  . Protein-calorie malnutrition, severe   . Esophageal reflux   . Chest pain 12/27/2014  . Chronic atrial fibrillation 12/27/2014  . Pain in the chest   . Diaphoresis   . Bradycardia   . Dysphagia   . MG, crisis (myasthenia gravis) 12/17/2014  . Dysphagia, pharyngoesophageal phase   . Enteritis due to Clostridium difficile   . Clostridium difficile diarrhea 12/15/2014  . Hypernatremia   . Acute exacerbation of myasthenia gravis   . Myasthenia gravis in crisis 12/05/2014  . Bladder cancer 12/05/2014  . HTN (hypertension) 12/05/2014  . A-fib 12/05/2014  . Chronic anticoagulation 12/05/2014  . Acute respiratory failure with hypoxia 12/05/2014    Past Medical History:  Past Medical History  Diagnosis Date  . Atrial fibrillation, chronic     on Xarelto   . HTN (hypertension)   . CHF (congestive heart failure)   . Diabetes mellitus   . 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 Abdominal wall metastasis removal      Assessment & Plan Clinical Impression: Patient is a 74 y.o. year old male with history of A fib, metastatic bladder cancer, recent PNA, MG who has had worsening over past months. He was evaluated by neurology 3 days PTA and was placed on steroids with increase in mestinon due to difficulty swallowing and SOB. Patient continued to worsen and was admitted via [place] on 12/05/14 with SOB,  swallowing difficulty, difficulty hold head up with droopy eyelids and congestion. s. He was intubated due to respiratory distress and started on IV solumedrol as well as plasmapheresis per input by Dr. Doy Mince. He has responded to treatment and tolerated extubation on 01/25 with use of BIPAP prn. He is able to suction oral secretions and is currently NPO with tube feeds for nutritional support. He was treated with PLEX X 6 days with improvement in SOB. He was transferred to [facility] 12/17/14 and has been making good progress with in therapy with improvement in activity tolerance, decrease secretions as well as improvement in oxygenation. Repeat MBS showed poor recovery of swallow function therefore PEG was placed by Dr. Vernard Gambles on 12/26/14. Later that evening patient developed mid sternal chest pain radiating to left shouder and cardiac enzymes as well as EKG was done for workup. CTA chest was negative for PTX, effusions or PE and small amount of air seen anterior due to procedure.  Patient transferred to [facility] on 12/29/2014 .    Patient currently requires min with basic self-care skills and basic mobility secondary to muscle weakness, decreased cardiorespiratoy endurance and decreased oxygen support and decreased standing balance.  Prior to hospitalization, patient could complete ADL with independent .  Patient will benefit from skilled intervention to decrease level of assist with basic self-care skills and increase independence with basic self-care skills prior to discharge home with care partner.  Anticipate patient will require intermittent supervision and follow up home health.  OT - End of Session  Activity Tolerance: Improving Endurance Deficit: Yes OT Assessment Rehab Potential (ACUTE ONLY): Good OT Patient demonstrates impairments in the following area(s): Balance;Endurance;Motor;Safety OT Basic ADL's Functional Problem(s): Grooming;Bathing;Dressing;Toileting OT Transfers Functional Problem(s):  Toilet;Tub/Shower OT Additional Impairment(s): Fuctional Use of Upper Extremity OT Plan OT Intensity: Minimum of 1-2 x/day, 45 to 90 minutes OT Frequency: 5 out of 7 days OT Duration/Estimated Length of Stay: 4-5 days OT Treatment/Interventions: Balance/vestibular training;Community reintegration;Discharge planning;DME/adaptive equipment instruction;Disease mangement/prevention;Neuromuscular re-education;Patient/family education;Self Care/advanced ADL retraining;Therapeutic Exercise;UE/LE Coordination activities;UE/LE Strength taining/ROM;Visual/perceptual remediation/compensation;Therapeutic Activities;Skin care/wound managment;Functional mobility training;Psychosocial support OT Self Feeding Anticipated Outcome(s): n/a OT Basic Self-Care Anticipated Outcome(s): supervision OT Toileting Anticipated Outcome(s): mod I  OT Bathroom Transfers Anticipated Outcome(s): mod I  OT Recommendation Patient destination: Home Equipment Recommended: Tub/shower seat   Skilled Therapeutic Intervention OT eval initiated with OT goals, purpose and role discussed. This patient is known to this therapist from previous admit. Self care retraining at sink level with focus on sit to stands, activity tolerance, standing tolerance, d/c planning, functional ambulation with RW and SPC with steadying A etc. Pt utilized stool to prop LEs on for washing feet and threading underwear and brief- wife reports will use one at home. Pt able to work off the o2 with spot checks; with sats ranging from 90-95% with rest breaks as needed. Pt left with o2 off for 2-3 min before OT came in. Wife present to observe in therapy.  Discussed with RN education with wife as goal for this week as part of d/c planning   OT Evaluation Precautions/Restrictions  Precautions Precautions: Fall;Other (comment) Precaution Comments: de-sats with activity, PEG tube Restrictions Weight Bearing Restrictions: No General Chart Reviewed:  Yes Family/Caregiver Present: Yes (wife) Vital Signs   Pain Pain Assessment Pain Assessment: No/denies pain Pain Score: 0-No pain Home Living/Prior Functioning Home Living Available Help at Discharge: Family, Available 24 hours/day Type of Home: House Home Access: Stairs to enter Technical brewer of Steps: 3 Entrance Stairs-Rails: Right Home Layout: One level, Laundry or work area in basement  Lives With: Spouse Prior Function Level of Independence: Requires assistive device for independence  Able to Take Stairs?: Yes 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 Used SPC PTA on days he "wasn't feeling great" ADL ADL ADL Comments: see FIM Vision/Perception  Vision- History Baseline Vision/History: Wears glasses Wears Glasses: Reading only Patient Visual Report: Other (comment) Vision- Assessment Vision Assessment?: No apparent visual deficits;Yes  Cognition Overall Cognitive Status: Within Functional Limits for tasks assessed Arousal/Alertness: Awake/alert Orientation Level: Oriented X4 Safety/Judgment: Appears intact Sensation Sensation Light Touch: Appears Intact Proprioception: Appears Intact Coordination Gross Motor Movements are Fluid and Coordinated: Yes Fine Motor Movements are Fluid and Coordinated: Yes Motor  Motor Motor: Abnormal postural alignment and control Motor - Skilled Clinical Observations: decreased strength in head-neck Mobility  Bed Mobility Bed Mobility: Supine to Sit Supine to Sit: 5: Supervision Transfers Transfers: Sit to Stand;Stand to Sit Sit to Stand: 5: Supervision Stand to Sit: 5: Supervision  Trunk/Postural Assessment  Cervical Assessment Cervical Assessment:  (decreased cervical extension strength, decreased ROM overall) Cervical AROM Overall Cervical AROM Comments: decr ability to turn to his left (maybe limited by central line) Cervical  Strength Overall Cervical Strength Comments: head flexed forward and difficulty sustaining upright posture/ position  Thoracic Assessment Thoracic Assessment:  (increased kyphosis) Thoracic Strength Overall Thoracic Strength Comments: flexed posture while seated Lumbar Assessment Lumbar Assessment:  (decreased lordosis) Postural Control Postural Control: Deficits on evaluation Head Control:  remains forward flexed; requires cues to achieve upright position  Balance Balance Balance Assessed: Yes Standardized Balance Assessment Standardized Balance Assessment: Berg Balance Test Berg Balance Test Sit to Stand: Able to stand  independently using hands Standing Unsupported: Able to stand safely 2 minutes Sitting with Back Unsupported but Feet Supported on Floor or Stool: Able to sit safely and securely 2 minutes Stand to Sit: Sits safely with minimal use of hands Transfers: Able to transfer safely, definite need of hands Standing Unsupported with Eyes Closed: Able to stand 10 seconds with supervision Standing Ubsupported with Feet Together: Able to place feet together independently and stand for 1 minute with supervision From Standing, Reach Forward with Outstretched Arm: Can reach forward >5 cm safely (2") From Standing Position, Pick up Object from Floor: Able to pick up shoe, needs supervision From Standing Position, Turn to Look Behind Over each Shoulder: Turn sideways only but maintains balance Extremity/Trunk Assessment RUE Assessment RUE Assessment: Exceptions to [facility] RUE AROM (degrees) RUE Overall AROM Comments: shoulder 100 flexion ; elbow and hand WFL RUE Strength RUE Overall Strength Comments: 4/5  difficulty with sustaining during a functional activity against gravity LUE AROM (degrees) LUE Overall AROM Comments: WFL  LUE Strength LUE Overall Strength Comments: 4/5  FIM:  FIM - Eating Eating Activity: 1: Helper performs IV, parenteral, or tube feeding FIM -  Grooming Grooming Steps: Wash, rinse, dry face;Wash, rinse, dry hands;Brush, comb hair Grooming: 4: Patient completes 3 of 4 or 4 of 5 steps FIM - Bathing Bathing Steps Patient Completed: Chest;Right Arm;Left Arm;Abdomen;Left upper leg;Right upper leg;Buttocks;Front perineal area;Right lower leg (including foot);Left lower leg (including foot) Bathing: 4: Steadying assist FIM - Upper Body Dressing/Undressing Upper body dressing/undressing steps patient completed: Thread/unthread right sleeve of pullover shirt/dresss;Thread/unthread left sleeve of pullover shirt/dress;Put head through opening of pull over shirt/dress;Pull shirt over trunk Upper body dressing/undressing: 5: Supervision: Safety issues/verbal cues FIM - Lower Body Dressing/Undressing Lower body dressing/undressing steps patient completed: Thread/unthread right underwear leg;Thread/unthread left underwear leg;Pull underwear up/down;Thread/unthread right pants leg;Thread/unthread left pants leg;Pull pants up/down;Don/Doff right sock;Don/Doff left sock;Don/Doff right shoe;Don/Doff left shoe;Fasten/unfasten right shoe;Fasten/unfasten left shoe Lower body dressing/undressing: 4: Steadying Assist FIM - Bed/Chair Transfer Bed/Chair Transfer: 5: Supine > Sit: Supervision (verbal cues/safety issues);5: Sit > Supine: Supervision (verbal cues/safety issues);4: Bed > Chair or W/C: Min A (steadying Pt. > 75%)   Refer to Care Plan for Long Term Goals  Recommendations for other services: Neuropsych for coping?  Discharge Criteria: Patient will be discharged from OT if patient refuses treatment 3 consecutive times without medical reason, if treatment goals not met, if there is a change in medical status, if patient makes no progress towards goals or if patient is discharged from hospital.  The above assessment, treatment plan, treatment alternatives and goals were discussed and mutually agreed upon: by patient and by family  Nicoletta Ba 12/30/2014, 11:13 AM

## 2014-12-30 NOTE — Progress Notes (Signed)
Inpatient [facility] Individual Statement of Services  Patient Name:  Craig [name]  Date:  12/30/2014  Welcome to the [facility].  Our goal is to provide you with an individualized program based on your diagnosis and situation, designed to meet your specific needs.  With this comprehensive rehabilitation program, you will be expected to participate in at least 3 hours of rehabilitation therapies Monday-Friday, with modified therapy programming on the weekends.  Your rehabilitation program will include the following services:  Physical Therapy (PT), Occupational Therapy (OT), Speech Therapy (ST), 24 hour per day rehabilitation nursing, Neuropsychology, Case Management (Social Worker), Rehabilitation Medicine, Nutrition Services and Pharmacy Services  Weekly team conferences will be held on Tuesdays to discuss your progress.  Your Social Worker will talk with you frequently to get your input and to update you on team discussions.  Team conferences with you and your family in attendance may also be held.  Expected length of stay:  4 to 7 days  Overall anticipated outcome:  Supervision  Depending on your progress and recovery, your program may change. Your Social Worker will coordinate services and will keep you informed of any changes. Your Social Worker's name and contact numbers are listed  below.  The following services may also be recommended but are not provided by the [facility] will be made to provide these services after discharge if needed.  Arrangements include referral to agencies that provide these services.  Your insurance has been verified to be:  [employer] Your primary doctor is:  Dr. Ann Held  Pertinent information will be shared with your doctor and your insurance company.  Social Worker:  Alfonse Alpers, LCSW  [phone] or [phone]  Information discussed with and copy given to patient by: Trey Sailors, 12/30/2014, 2:10 PM

## 2014-12-30 NOTE — Progress Notes (Signed)
Patient information reviewed and updated in eRehab system by Daiva Nakayama, RN, [place], [place] Coordinator.  Information including medical coding and functional independence measure will be reviewed and updated through discharge.    Per nursing patient was given "Data Collection Information Summary for Patients in Inpatient Rehabilitation Facilities with attached "Privacy Act [facility] Records" upon admission 12/17/14 and they decline needing to review again. Per CMS regulations this is an interrupted stay with an acute stay of less than 3 days.  One patient assessment instrument will be transmitted to CMS reflecting the interruption.  Records will be merged.

## 2014-12-31 ENCOUNTER — Encounter (HOSPITAL_COMMUNITY): Payer: PPO | Admitting: Occupational Therapy

## 2014-12-31 ENCOUNTER — Inpatient Hospital Stay (HOSPITAL_COMMUNITY): Payer: PPO | Admitting: *Deleted

## 2014-12-31 ENCOUNTER — Inpatient Hospital Stay (HOSPITAL_COMMUNITY): Payer: PPO | Admitting: Physical Therapy

## 2014-12-31 ENCOUNTER — Inpatient Hospital Stay (HOSPITAL_COMMUNITY): Payer: PPO | Admitting: Speech Pathology

## 2014-12-31 LAB — GLUCOSE, CAPILLARY
GLUCOSE-CAPILLARY: 279 mg/dL — AB (ref 70–99)
GLUCOSE-CAPILLARY: 252 mg/dL — AB (ref 70–99)
GLUCOSE-CAPILLARY: 234 mg/dL — AB (ref 70–99)
Glucose-Capillary: 120 mg/dL — ABNORMAL HIGH (ref 70–99)

## 2014-12-31 LAB — MYOGLOBIN, SERUM: MYOGLOBIN: 44 ug/L (ref <=50)

## 2014-12-31 NOTE — Progress Notes (Signed)
Wife demonstrated correct medication and tube feeding administration with nurse. Wife educated on tube feeding and site care. Staff will contiune education and demonstration with wife.

## 2014-12-31 NOTE — Progress Notes (Signed)
Occupational Therapy Session Note  Patient Details  Name: Craig [name] MRN: [id number] Date of Birth: Mar 14, 1941  Today's Date: 12/31/2014 OT Individual Time: 1100-1200 OT Individual Time Calculation (min): 60 min    Short Term Goals: Week 1:  OT Short Term Goal 1 (Week 1): LTG=STG  Skilled Therapeutic Interventions/Progress Updates:    Pt seen for ADL dressing and bathing task. Pt completed w/c <> toilet with supervision from wife, completing family/ caregiver training. Wife checked off to complete w/c<> toilet transfers without hospital staff present. Pt then ambulated ~5 ft into walk-in shower for seated shower in shower chair with close supervision. Pt required min steadying assist when exiting the shower when stepping over mock step to simulate home shower stall. Pt completed seated showering task with supervision and O2 on 2L. Pt completed UB and LB dressing seated in w/c with set-up and small step stool for reaching B LEs to don shoes and socks. Buttock hygiene completed when standing at the sink during LB dressing due to decreased room for lateral leans inside shower.Pt able to stand to pull pants up with supervision and self steadying on sink.   Pt completed grooming with min A, demonstrating ability to reach entire head for hair brushing, an improvement from previous rehab admission. Pt applied shaving cream and requested OT to complete shaving task. Pt iniated teeth brushing task with suction toothbrush, and requesting therapist to complete task for thoroughness.  Education provided to pt and caregiver regarding safety within the bathroom, home safety modifications, and energy conservation.  At end of session, pt walked ~10 ft with SPC to bedside recliner with close supervision. Green and yellow therabands provided to pt, with pt voicing understanding of exercises taught during previous admission. Pt left sitting in recliner with RN and wife present at end of session.   Therapy  Documentation Precautions:  Precautions Precautions: Fall, Other (comment) Precaution Comments: de-sats with activity, PEG tube Restrictions Weight Bearing Restrictions: No General:   Vital Signs:   Pain: Pain Assessment Pain Assessment: No/denies pain ADL: ADL ADL Comments: see FIM  See FIM for current functional status  Therapy/Group: Individual Therapy  Woody, [name] 12/31/2014, 12:13 PM

## 2014-12-31 NOTE — Progress Notes (Signed)
Physical Therapy Session Note  Patient Details  Name: Craig [name] MRN: [id number] Date of Birth: June 06, 1941  Today's Date: 12/31/2014 PT Individual Time: 0830-0930 PT Individual Time Calculation (min): 60 min   Short Term Goals: Week 1:  PT Short Term Goal 1 (Week 1): =STG due to ELOS  Skilled Therapeutic Interventions/Progress Updates:    Therapeutic Activity: PT instructs pt in logrolling to R in flat bed without rail and R side lie to sit req SBA for safety. PT instructs pt in stand-step transfer bed to w/c with SPC req SBA for safety due to impaired stand balance.   W/C Management: PT demonstrates to pt how to don swing away legrests - pt is able to manage brakes with verbal cues as reminders for safety. PT instructs pt in w/c propulsion on level surface in a controlled environment x 165' req verbal cues to use R arm more so as not to hit R wall of hallway.   Gait Training: PT instructs pt in ambulation with SPC req +2 assist (w/c follow for safety due to low endurance) x 100' + 135'. SaO2 monitored closely and pt desaturates to 88% on 2 L O2/min, but recovers to > 90% with seated rest in < 15 seconds.   Neuromuscular Reeducation: PT finishes up Berg Balance Test that was started yesterday by PT and pt scores 36/56, indicating high falls risk.  PT instructs pt in cervical extension exercise to improve sitting posture: 3 x 10 reps.   Pt's cervical extensor muscles are slowly improving, but continue to fatigue quickly. PT cued pt for upright head posture during gait and w/c propulsion. Pt's activity tolerance continues to be limited and supplemental oxygen is continued to be needed during functional mobility tasks. PT introduced the idea of a 4WW as an AD for pt to use at d/c due to his limited endurance. Pt req continued work on stairs. Pt left sitting up in w/c with quick release belt on, wife present, and all needs in reach. Continue per PT POC.   Therapy Documentation Precautions:   Precautions Precautions: Fall, Other (comment) Precaution Comments: de-sats with activity, PEG tube Restrictions Weight Bearing Restrictions: No Pain: Pain Assessment Pain Assessment: No/denies pain Pain Score: 0-No pain Pain Type: Acute pain Pain Location: Abdomen Pain Orientation: Left;Upper Pain Descriptors / Indicators: Aching;Sore Pain Frequency: Intermittent Pain Onset: On-going Patients Stated Pain Goal: 2 Pain Intervention(s): Medication (See eMAR)  Balance: Balance Balance Assessed: Yes Standardized Balance Assessment Standardized Balance Assessment: Berg Balance Test Berg Balance Test Sit to Stand: Able to stand  independently using hands Standing Unsupported: Able to stand safely 2 minutes Sitting with Back Unsupported but Feet Supported on Floor or Stool: Able to sit safely and securely 2 minutes Stand to Sit: Sits safely with minimal use of hands Transfers: Able to transfer safely, definite need of hands Standing Unsupported with Eyes Closed: Able to stand 10 seconds with supervision Standing Ubsupported with Feet Together: Able to place feet together independently and stand for 1 minute with supervision From Standing, Reach Forward with Outstretched Arm: Can reach forward >5 cm safely (2") From Standing Position, Pick up Object from Floor: Able to pick up shoe, needs supervision From Standing Position, Turn to Look Behind Over each Shoulder: Turn sideways only but maintains balance Turn 360 Degrees: Needs close supervision or verbal cueing Standing Unsupported, Alternately Place Feet on Step/Stool: Able to complete >2 steps/needs minimal assist Standing Unsupported, One Foot in Front: Able to take small step independently and hold 30  seconds Standing on One Leg: Tries to lift leg/unable to hold 3 seconds but remains standing independently Total Score: 36  See FIM for current functional status  Therapy/Group: Individual Therapy  Larry Alcock M 12/31/2014,  8:42 AM

## 2014-12-31 NOTE — Progress Notes (Signed)
Received report on patient just after 2300 from float RN.  Per report, patient did not receive HS Jevity bolus feed because patient complained of fullness.  Per report, tube flushed with free water, HS meds given, but bolus feed held.  Patient received 5units of Novolog insulin.  Will continue to monitor patient throughout night.

## 2014-12-31 NOTE — Progress Notes (Signed)
[place] PHYSICAL MEDICINE & REHABILITATION     PROGRESS NOTE    Subjective/Complaints: uneventful night. Belly feels better. No sob, cough, cp  Objective: Vital Signs: Blood pressure 109/66, pulse 65, temperature 97.4 F (36.3 C), temperature source Oral, resp. rate 18, height 5\' 7"  (1.702 m), weight 101.9 kg (224 lb 10.4 oz), SpO2 96 %. No results found.  Recent Labs  12/29/14 0350 12/30/14 0550  WBC 15.8* 12.0*  HGB 13.6 13.1  HCT 40.4 38.8*  PLT 201 238    Recent Labs  12/29/14 0350 12/30/14 0550  NA 140 141  K 3.1* 3.0*  CL 99 99  GLUCOSE 197* 172*  BUN 12 16  CREATININE 0.68 0.64  CALCIUM 8.3* 8.6   CBG (last 3)   Recent Labs  12/30/14 1628 12/30/14 2115 12/31/14 0655  GLUCAP 234* 186* 120*    Wt Readings from Last 3 Encounters:  12/31/14 101.9 kg (224 lb 10.4 oz)  12/29/14 106.006 kg (233 lb 11.2 oz)  12/17/14 107.4 kg (236 lb 12.4 oz)    Physical Exam:  Constitutional: He appears well-developed and well-nourished. Appears fatigued still    HENT: BiBap mask secured, mild irritation over nose Head: Normocephalic and atraumatic.  Eyes: Left conjunctiva somewhat injected.  Ptosis improved  Cardiovascular: Normal rate and regular rhythm.  Respiratory: Effort normal. No respiratory distress. He has no wheezes. He has rhonchi.  GI: Soft. Bowel sounds are normal. He exhibits no distension. There is tenderness near g tube---synched down quite tightly Musculoskeletal: He exhibits no edema or tenderness.  Neurological: He is alert.  Facial diplegia improving. Cough and oro-motor control is better..voice still lacks volume Followed motor commands without difficulty. Reasonable insight and awareness. Sensory exam intact to PP and LT. Strength 4/5 UE's bilaterally prox to distal. LE's4/5 hf, 4+/5ke, 4+ankles.  Skin: Skin is warm and dry.  Psychiatric: He has a normal mood and affect. His behavior is normal. Good insight and  awareness   Assessment/Plan: 1. Functional deficits secondary to Myasthenia gravis which require 3+ hours per day of interdisciplinary therapy in a comprehensive inpatient rehab setting. Physiatrist is providing close team supervision and 24 hour management of active medical problems listed below. Physiatrist and rehab team continue to assess barriers to discharge/monitor patient progress toward functional and medical goals. FIM: FIM - Bathing Bathing Steps Patient Completed: Chest, Right Arm, Left Arm, Abdomen, Left upper leg, Right upper leg, Buttocks, Front perineal area, Right lower leg (including foot), Left lower leg (including foot) Bathing: 4: Steadying assist  FIM - Upper Body Dressing/Undressing Upper body dressing/undressing steps patient completed: Thread/unthread right sleeve of pullover shirt/dresss, Thread/unthread left sleeve of pullover shirt/dress, Put head through opening of pull over shirt/dress, Pull shirt over trunk Upper body dressing/undressing: 5: Supervision: Safety issues/verbal cues FIM - Lower Body Dressing/Undressing Lower body dressing/undressing steps patient completed: Thread/unthread right underwear leg, Thread/unthread left underwear leg, Pull underwear up/down, Thread/unthread right pants leg, Thread/unthread left pants leg, Pull pants up/down, Don/Doff right sock, Don/Doff left sock, Don/Doff right shoe, Don/Doff left shoe, Fasten/unfasten right shoe, Fasten/unfasten left shoe Lower body dressing/undressing: 4: Steadying Assist        FIM - Bed/Chair Transfer Bed/Chair Transfer: 4: Bed > Chair or W/C: Min A (steadying Pt. > 75%), 4: Chair or W/C > Bed: Min A (steadying Pt. > 75%)  FIM - Locomotion: Wheelchair Locomotion: Wheelchair: 0: Activity did not occur FIM - Locomotion: Ambulation Locomotion: Ambulation Assistive Devices: Journalist, newspaper Ambulation/Gait Assistance: 4: Min guard, 5: Supervision  Locomotion: Ambulation: 2: Travels 50 - 149 ft  with minimal assistance (Pt.>75%)  Comprehension Comprehension Mode: Auditory Comprehension: 6-Follows complex conversation/direction: With extra time/assistive device  Expression Expression Mode: Verbal Expression: 4-Expresses basic 75 - 89% of the time/requires cueing 10 - 24% of the time. Needs helper to occlude trach/needs to repeat words.  Social Interaction Social Interaction: 6-Interacts appropriately with others with medication or extra time (anti-anxiety, antidepressant).  Problem Solving Problem Solving: 5-Solves complex 90% of the time/cues < 10% of the time  Memory Memory: 5-Recognizes or recalls 90% of the time/requires cueing < 10% of the time  Medical Problem List and Plan: 1. Functional deficits secondary to MG crises with respiratory failure.  2. DVT Prophylaxis/Anticoagulation: Pharmaceutical: Xarelto 3. Pain Management: Tylenol prn 4. Mood: Team to provide ego support. LCSW to follow for evaluation and support.  5. Neuropsych: This patient is capable of making decisions on her own behalf. 6. Skin/Wound Care: routine pressure relief measures. Maintain adequate nutrition and hydration status.  7. Fluids/Electrolytes/Nutrition: Continue Vilinda Flake for bulking. Continue water flushes every 4 hours.    8. Chest pain with acute respiratory failure: Likely due to procedure with sedation superimposed on recent MG crises. Encourage IS  9. A fib: Monitor heart rate every 8 hours. Continue amiodarone and Xarelto.  10. Hypokalemia: continue supplement and recheck levels thursday.  12. MG crises: On prednisone 60 mg/daily and mestinon 90 mg qid (increased few days PTA). Monitor for recovery past PLEX.  13. Continued oro-pharyngeal dysphagia: NPO except for oral trials with ST. Continue tube feeds via PEG.   -may need to loosen flange on g tube  -pain improved overall---made it through therapy yesterday 14. C diff colitis: Has completed treatment with resolution  of diarrhea.  15. Hyperglycemia: may need to augment regimen  -starlix started yesterday  -? scheduled insulin  LOS (Days) 2 A FACE TO FACE EVALUATION WAS PERFORMED  Liora Myles T 12/31/2014 8:41 AM

## 2014-12-31 NOTE — Progress Notes (Signed)
Physical Therapy Session Note  Patient Details  Name: Craig [name] MRN: [id number] Date of Birth: 06-Sep-1941  Today's Date: 12/31/2014 PT Individual Time: 1445-1515 PT Individual Time Calculation (min): 30 min   Short Term Goals: Week 1:  PT Short Term Goal 1 (Week 1): =STG due to ELOS  Skilled Therapeutic Interventions/Progress Updates:    Patient received sitting in recliner. Patient's wife present during entire session and manages O2 tank. Session focused on wheelchair mobility, household ambulation, stair negotiation, and stepping over obstacles. Household ambulation within room ~15' x2 with [facility] and close supervision to intermittent min guard. Wheelchair mobility 175' x1 with supervision. Stair negotiation x4 steps with R handrail and SPC ascending, L handrail and SPC descending with minA and step to pattern. Stepping over obstacles of various sizes (bolsters, canes) with SPC and close supervision, minA for stepping over bolster. Patient returned to room and left sitting in recliner with all needs within reach and wife present.  Therapy Documentation Precautions:  Precautions Precautions: Fall, Other (comment) Precaution Comments: de-sats with activity, PEG tube Restrictions Weight Bearing Restrictions: No Pain: Pain Assessment Pain Assessment: No/denies pain Pain Score: 0-No pain Locomotion : Ambulation Ambulation/Gait Assistance: 4: Min guard;5: Supervision Wheelchair Mobility Distance: 175   See FIM for current functional status  Therapy/Group: Individual Therapy  Lillia Abed. Jaykob Minichiello, PT, DPT 12/31/2014, 3:18 PM

## 2014-12-31 NOTE — Progress Notes (Signed)
Recreational Therapy Session Note  Patient Details  Name: Craig [name] MRN: [id number] Date of Birth: 21-Jul-1941 Today's Date: 12/31/2014  Pain: no c/o Skilled Therapeutic Interventions/Progress Updates: Familiar with pt from previous admission to CIR.  Met with pt and wife and discussed leisure interests, modifications/adaptations, energy conservation techniques & community pursuits.  Both stated understanding.  Due to scheduled discharge date of 2/19, full eval deferred.  Will continue to monitor through team.  Nadiya Pieratt 12/31/2014, 10:42 AM

## 2014-12-31 NOTE — Progress Notes (Signed)
Speech Language Pathology Daily Session Note  Patient Details  Name: Craig [name] MRN: [id number] Date of Birth: 27-Feb-1941  Today's Date: 12/31/2014 SLP Individual Time: 1300-1330 SLP Individual Time Calculation (min): 30 min  Short Term Goals: Week 1: SLP Short Term Goal 1 (Week 1): short term goals = long term goals  Skilled Therapeutic Interventions: Skilled treatment session with patient's wife present focused on dysphagia goals. Student facilitated session by presenting PO trials of ice chips following oral care. Patient demonstrated overt s/s of aspiration in 50% of trials, with overt s/s increasing as trials progressed, suspect due to fatigue. Patient also required Min A multimodal cues for keeping oral cavity closed during therapy session. Student further facilitated session by providing family education regarding oral care at home and discharge concerns. Patient left in recliner with wife present and all needs within reach. Continue with current plan of care.   FIM:  Comprehension Comprehension Mode: Auditory Comprehension: 6-Follows complex conversation/direction: With extra time/assistive device Expression Expression Mode: Verbal Expression: 4-Expresses basic 75 - 89% of the time/requires cueing 10 - 24% of the time. Needs helper to occlude trach/needs to repeat words. Social Interaction Social Interaction: 6-Interacts appropriately with others with medication or extra time (anti-anxiety, antidepressant). Problem Solving Problem Solving: 5-Solves complex 90% of the time/cues < 10% of the time Memory Memory: 5-Recognizes or recalls 90% of the time/requires cueing < 10% of the time  Pain Pain Assessment Pain Assessment: No/denies pain   Therapy/Group: Individual Therapy  [name] 12/31/2014, 2:57 PM

## 2015-01-01 ENCOUNTER — Encounter (HOSPITAL_COMMUNITY): Payer: PPO | Admitting: Occupational Therapy

## 2015-01-01 ENCOUNTER — Inpatient Hospital Stay (HOSPITAL_COMMUNITY): Payer: PPO | Admitting: Speech Pathology

## 2015-01-01 ENCOUNTER — Inpatient Hospital Stay (HOSPITAL_COMMUNITY): Payer: PPO

## 2015-01-01 ENCOUNTER — Inpatient Hospital Stay (HOSPITAL_COMMUNITY): Payer: PPO | Admitting: Occupational Therapy

## 2015-01-01 ENCOUNTER — Encounter (HOSPITAL_COMMUNITY): Payer: PPO

## 2015-01-01 LAB — URINE CULTURE: Colony Count: >=100000

## 2015-01-01 LAB — BASIC METABOLIC PANEL
ANION GAP: 9 (ref 5–15)
BUN: 15 mg/dL (ref 6–23)
CO2: 30 mmol/L (ref 19–32)
CREATININE: 0.59 mg/dL (ref 0.50–1.35)
Calcium: 8.8 mg/dL (ref 8.4–10.5)
Chloride: 104 mmol/L (ref 96–112)
GFR calc Af Amer: >90 mL/min (ref >90)
GFR calc non Af Amer: >90 mL/min (ref >90)
Glucose, Bld: 92 mg/dL (ref 70–99)
POTASSIUM: 3.4 mmol/L — AB (ref 3.5–5.1)
Sodium: 143 mmol/L (ref 135–145)

## 2015-01-01 LAB — GLUCOSE, CAPILLARY
GLUCOSE-CAPILLARY: 101 mg/dL — AB (ref 70–99)
GLUCOSE-CAPILLARY: 278 mg/dL — AB (ref 70–99)
GLUCOSE-CAPILLARY: 180 mg/dL — AB (ref 70–99)
Glucose-Capillary: 84 mg/dL (ref 70–99)
Glucose-Capillary: 300 mg/dL — ABNORMAL HIGH (ref 70–99)
Glucose-Capillary: 304 mg/dL — ABNORMAL HIGH (ref 70–99)

## 2015-01-01 MED ORDER — NATEGLINIDE 60 MG PO TABS
60.0000 mg | ORAL_TABLET | Freq: Three times a day (TID) | ORAL | Status: DC
  Administered 2015-01-01: 60 mg
  Filled 2015-01-01 (×3): qty 1

## 2015-01-01 MED ORDER — CEPHALEXIN 250 MG/5ML PO SUSR
250.0000 mg | Freq: Three times a day (TID) | ORAL | Status: DC
  Administered 2015-01-01 – 2015-01-02 (×3): 250 mg
  Filled 2015-01-01 (×8): qty 5

## 2015-01-01 MED ORDER — INSULIN ASPART 100 UNIT/ML ~~LOC~~ SOLN
0.0000 [IU] | Freq: Once | SUBCUTANEOUS | Status: AC
  Administered 2015-01-01: 11 [IU] via SUBCUTANEOUS

## 2015-01-01 MED ORDER — POTASSIUM CHLORIDE 20 MEQ/15ML (10%) PO SOLN
20.0000 meq | Freq: Three times a day (TID) | ORAL | Status: DC
  Administered 2015-01-01 – 2015-01-02 (×3): 20 meq
  Filled 2015-01-01 (×8): qty 15

## 2015-01-01 MED ORDER — NATEGLINIDE 120 MG PO TABS
120.0000 mg | ORAL_TABLET | Freq: Three times a day (TID) | ORAL | Status: DC
  Administered 2015-01-01 – 2015-01-02 (×2): 120 mg
  Filled 2015-01-01 (×5): qty 1

## 2015-01-01 NOTE — Progress Notes (Signed)
Blood sugars and tube feedings are not being corelated. Got am tube feed at 10:30 am with BS of 300 at noon. Advised to recheck around 2 pm and follow up for coverage as indicated.

## 2015-01-01 NOTE — IPOC Note (Signed)
Overall Plan of Care [facility]) Patient Details Name: Craig [name] MRN: [id number] DOB: 1941-03-28  Admitting Diagnosis: [facility] Problems: Principal Problem:   MG, crisis (myasthenia gravis) Active Problems:   Bladder cancer   Clostridium difficile diarrhea   Dysphagia, pharyngoesophageal phase   MG with exacerbation (myasthenia gravis)     Functional Problem List: Nursing Edema, Endurance, Medication Management, Motor, Nutrition, Pain, Skin Integrity  PT Balance, Endurance, Skin Integrity, Motor  OT Balance, Endurance, Motor, Safety  SLP Nutrition, Linguistic  TR         Basic ADL's: OT Grooming, Bathing, Dressing, Toileting     Advanced  ADL's: OT       Transfers: PT Bed Mobility, Bed to Chair, Car, Manufacturing systems engineer, Metallurgist: PT Ambulation, Stairs     Additional Impairments: OT Fuctional Use of Upper Extremity  SLP Communication, Swallowing expression    TR      Anticipated Outcomes Item Anticipated Outcome  Self Feeding n/a  Swallowing  Min assist during dysphagia treatment    Basic self-care  supervision  Toileting  mod I    Bathroom Transfers mod I   Bowel/Bladder  Mod I  Transfers  mod (I), S car  Locomotion  S for household ambulation  Communication  Supervision   Cognition     Pain  < 3  Safety/Judgment  Supervision   Therapy Plan: PT Intensity: Minimum of 1-2 x/day ,45 to 90 minutes PT Frequency: 5 out of 7 days PT Duration Estimated Length of Stay: 5-7 days OT Intensity: Minimum of 1-2 x/day, 45 to 90 minutes OT Frequency: 5 out of 7 days OT Duration/Estimated Length of Stay: 4-5 days SLP Intensity: Minumum of 1-2 x/day, 30 to 90 minutes SLP Frequency: 3 to 5 out of 7 days SLP Duration/Estimated Length of Stay: 01/02/13       Team Interventions: Nursing Interventions Patient/Family Education, Pain Management, Disease Management/Prevention, Medication Management, Dysphagia/Aspiration  Precaution Training, Cognitive Remediation/Compensation, Skin Care/Wound Management, Discharge Planning  PT interventions Ambulation/gait training, Training and development officer, Community reintegration, Discharge planning, Disease management/prevention, DME/adaptive equipment instruction, Functional mobility training, Neuromuscular re-education, Pain management, Patient/family education, Psychosocial support, Skin care/wound management, Splinting/orthotics, Stair training, Therapeutic Activities, Therapeutic Exercise, UE/LE Strength taining/ROM, UE/LE Coordination activities, Wheelchair propulsion/positioning  OT Interventions Training and development officer, Academic librarian, Discharge planning, Engineer, drilling, Disease mangement/prevention, Neuromuscular re-education, Patient/family education, Self Care/advanced ADL retraining, Therapeutic Exercise, UE/LE Coordination activities, UE/LE Strength taining/ROM, Visual/perceptual remediation/compensation, Therapeutic Activities, Skin care/wound managment, Functional mobility training, Psychosocial support  SLP Interventions Environmental controls, Dysphagia/aspiration precaution training, Cueing hierarchy, Functional tasks, Internal/external aids, Patient/family education, Therapeutic Activities  TR Interventions    SW/CM Interventions Discharge Planning, Psychosocial Support, Patient/Family Education    Team Discharge Planning: Destination: PT-Home ,OT- Home , SLP-Home Projected Follow-up: PT-Home health PT, OT-   , SLP-Outpatient SLP, Home Health SLP, 24 hour supervision/assistance Projected Equipment Needs: PT-To be determined, OT- Tub/shower seat, SLP-Other (comment) (suctioning) Equipment Details: PT-Pt already has SPC, OT-  Patient/family involved in discharge planning: PT- Patient, Family Midwife,  OT-Patient, Family member/caregiver, SLP-Patient, Family member/caregiver  MD ELOS: 7d Medical Rehab Prognosis:   Good Assessment: 74 y.o. male with history of A fib, metastatic bladder cancer, recent PNA, MG who has had worsening over past months. He was evaluated by neurology 3 days PTA and was placed on steroids with increase in mestinon due to difficulty swallowing and SOB. Patient continued to worsen and was admitted via [place] on 12/05/14 with SOB, swallowing  difficulty, difficulty hold head up with droopy eyelids and congestion. s. He was intubated due to respiratory distress and started on IV solumedrol as well as plasmapheresis per input by Dr. Doy Mince. He has responded to treatment and tolerated extubation on 01/25 with use of BIPAP prn. He is able to suction oral secretions and is currently NPO with tube feeds for nutritional support. He was treated with PLEX X 6 days with improvement in SOB   Now requiring 24/7 Rehab RN,MD, as well as CIR level PT, OT and SLP.  Treatment team will focus on ADLs and mobility with goals set at  Mod I /Sup See Team Conference Notes for weekly updates to the plan of care

## 2015-01-01 NOTE — Progress Notes (Signed)
SATURATION QUALIFICATIONS: (This note is used to comply with regulatory documentation for home oxygen)  Patient Saturations on Room Air at Rest = 94%  Patient Saturations on Room Air while Ambulating = 87%  Patient Saturations on 2 Liters of oxygen while Ambulating = 95%  Please briefly explain why patient needs home oxygen: Desats and c/o SOB during prolonged activity/ambulation when on room air.   [name], PT, DPT

## 2015-01-01 NOTE — Consult Note (Signed)
  NEUROCOGNITIVE [name]   Mr. Craig [name] is a 74 year old man, who was seen for a brief neurocognitive status examination to evaluate his emotional state and mental status in the setting of myasthenia gravis.  According to his medical record, he was admitted on 12/05/14 with shortness of breath, swallowing difficulty, trouble holding up head with droopy eyelids and congestion.  He was intubated due to respiratory distress and started on IV treatment.  He responded well to treatment and tolerated extubation on 1/25 with use of BIPAP prn.  He purportedly made good progress and was deemed appropriate for inpatient rehabilitation.  Staff members noted that his affect was flat and his wife had purportedly expressed concern about his tendency toward indecision.  Therefore, a neuropsychological consult was requested.    Emotional Functioning:  During the clinical interview, Mr. [name] denied concerns, stating that he has been "pretty good."  His wife was present during the session and stated that he seemed to have some trouble coping initially, but was "better now."  Mr. [name] denied experiencing symptoms of depression, including suicidal ideation, and said that he has been able to see the progress that he has made and feels optimistic about his recovery.  He commented that he feels ready to discharge and is highly motivated to continue with outpatient physical and speech therapy.  Ultimately, he acknowledged that he misses his grandchildren and he was tearful when talking about that.  His wife commented that he has been more "emotional" lately, but Mr. Brunton was not willing to engage in processing those emotions at a deeper level.  Problems with sleep were denied.  Mr. Pardi's responses to self-report measures of mood symptoms were not suggestive of the presence of clinically significant depression or anxiety at this time.    Mental Status:  Mr.  [name]'s total score on a very brief measure of mental status was not suggestive of the presence of significant cognitive disruption (MMSE-2 brief = 14/16).  He lost points for failing to freely recall 2 of 3 previously studied words after a very brief delay.  Subjectively, he denied noticing cognitive changes, except for slurred speech that his wife described and which was observed during the current session.    Impressions and Recommendations:  Mr. [name]'s brief neurocognitive screening was not indicative of marked cognitive disruption, at the level of dementia.  However, if he develops cognitive difficulties or concerns about his cognitive functioning, a comprehensive neuropsychological evaluation as an outpatient could be conducted.  From an emotional standpoint, Mr. [name] did not endorse symptoms that would suggest the presence of clinically significant mood disruption.  Risks for development of depression post-discharge were discussed with Mr. [name] and his wife and he agreed to inform his care team should he notice symptoms of clinical depression.    DIAGNOSIS:   Myasthenia Gravis  Marlane Hatcher, Psy.D.  Clinical Neuropsychologist

## 2015-01-01 NOTE — Progress Notes (Signed)
Social Work Patient ID: Craig [name], male   DOB: December 24, 1940, 74 y.o.   MRN: [id number]   CSW met with pt and wife to update them on team conference discussion.  Wife feels comfortable with a Friday d/c and she feels confident about his care.  CSW has ordered pt's DME, including shower seat with back, oxygen, suctioning, and tube feeding supplies through [facility].  Pt will also have home health from [facility] and [place] from insurance has approved this for pt. CSW will remain available to assist as needed.

## 2015-01-01 NOTE — Progress Notes (Signed)
Physical Therapy Session Note  Patient Details  Name: Makarios Madlock MRN: [id number] Date of Birth: 12-26-1940  Today's Date: 01/01/2015 PT Individual Time: 1450-1536 PT Individual Time Calculation (min): 46 min   Short Term Goals: Week 1:  PT Short Term Goal 1 (Week 1): =STG due to ELOS  Skilled Therapeutic Interventions/Progress Updates:   Initially pt on toilet again so session started late, but held patient over long enough to make up time.  Focused session on grad day activities and family education. Pt performed functional transfers at mod I level using SPC,and S for gait for safety with balance using SPC - cues for posture. Monitored O2 sats on room air throughout session and pt able to tolerate on room air with one episode of desatting to 87% during gait. Performed stair negotiation to simulate home entry with wife return demonstrating for safe guarding technique at close S level. Simulated car transfer overall with emphasis on safe technique to back up instead of step in and return demonstrated appropriately. Discussed and education provided on energy conservation techniques, HEP (hand out issued and reviewed), and overall importance of continued mobility upon d/c. Both patient and wife in agreement and verbalized understanding. Left up in recliner with wife and PA present in room.   Therapy Documentation Precautions:  Precautions Precautions: Fall, Other (comment) Precaution Comments: de-sats with activity, PEG tube Restrictions Weight Bearing Restrictions: No Pain: Pain Assessment Pain Assessment: No/denies pain   Locomotion : Ambulation Ambulation/Gait Assistance: 5: Supervision   See FIM for current functional status  Therapy/Group: Individual Therapy  [name] [facility] 01/01/2015, 4:04 PM

## 2015-01-01 NOTE — Progress Notes (Signed)
Occupational Therapy Session Note  Patient Details  Name: Christepher Melchior MRN: [id number] Date of Birth: [birth year]  Today's Date: 01/01/2015 OT Individual Time: 0800-0900 OT Individual Time Calculation (min): 60 min    Short Term Goals: Week 1:  OT Short Term Goal 1 (Week 1): LTG=STG  Skilled Therapeutic Interventions/Progress Updates:    Pt seen for ADL bathing and dressing session. Pt transferred supine> EOB independently with HOB flat and no hand rails. Pt walked into bathroom with SPC and sat on toilet with use of handrails. He then walked into shower and sat on shower chair. He required supervision and use of [facility] for in-room functional ambulation. He completed seated showering task with distant supervision. He completed grooming and UB and LB dressing tasks seated at the sink with set-up. Pt demonstrated good functional mobility and endurance throughout session. He remained on 2L O2 throughout session. No attempts to take pt off O2 were attempted.    Pt left sitting in recliner at end of session. All needs within reach and wife present.  Pt and caregiver educated regarding in home safety modifications, reducing fall risk, benefits of participation with ADLs, and POC.   Therapy Documentation Precautions:  Precautions Precautions: Fall, Other (comment) Precaution Comments: de-sats with activity, PEG tube Restrictions Weight Bearing Restrictions: No General: Vital Signs: Pain: Pain Assessment Pain Assessment: 0-10 Pain Score: 0-No pain ADL: ADL ADL Comments: see FIM  See FIM for current functional status  Therapy/Group: Individual Therapy  Yovanni, Giomar Gusler C 01/01/2015, 12:21 PM

## 2015-01-01 NOTE — Progress Notes (Signed)
[place] PHYSICAL MEDICINE & REHABILITATION     PROGRESS NOTE    Subjective/Complaints: Sleeping well. Denies pain, sob, cough  Objective: Vital Signs: Blood pressure 110/78, pulse 54, temperature 98.3 F (36.8 C), temperature source Axillary, resp. rate 16, height 5\' 7"  (1.702 m), weight 101 kg (222 lb 10.6 oz), SpO2 97 %. No results found.  Recent Labs  12/30/14 0550  WBC 12.0*  HGB 13.1  HCT 38.8*  PLT 238    Recent Labs  12/30/14 0550  NA 141  K 3.0*  CL 99  GLUCOSE 172*  BUN 16  CREATININE 0.64  CALCIUM 8.6   CBG (last 3)   Recent Labs  12/31/14 2049 01/01/15 0220 01/01/15 0651  GLUCAP 234* 101* 84    Wt Readings from Last 3 Encounters:  01/01/15 101 kg (222 lb 10.6 oz)  12/29/14 106.006 kg (233 lb 11.2 oz)  12/17/14 107.4 kg (236 lb 12.4 oz)    Physical Exam:  Constitutional: He appears well-developed and well-nourished. Appears fatigued still    HENT: BiBap mask secured, mild irritation over nose Head: Normocephalic and atraumatic.  Eyes: Left conjunctiva somewhat injected.  Ptosis improved  Cardiovascular: Normal rate and regular rhythm.  Respiratory: Effort normal. No respiratory distress. He has no wheezes. He has rhonchi.  GI: Soft. Bowel sounds are normal. He exhibits no distension. There is tenderness near g tube---synched down quite tightly Musculoskeletal: He exhibits no edema or tenderness.  Neurological: He is alert.  Facial diplegia improving. Cough and oro-motor control is better..voice still lacks volume Followed motor commands without difficulty. Reasonable insight and awareness. Sensory exam intact to PP and LT. Strength 4/5 UE's bilaterally prox to distal. LE's4/5 hf, 4+/5ke, 4+ankles.  Skin: Skin is warm and dry.  Psychiatric: He has a normal mood and affect. His behavior is normal. Good insight and awareness   Assessment/Plan: 1. Functional deficits secondary to Myasthenia gravis which require 3+ hours per  day of interdisciplinary therapy in a comprehensive inpatient rehab setting. Physiatrist is providing close team supervision and 24 hour management of active medical problems listed below. Physiatrist and rehab team continue to assess barriers to discharge/monitor patient progress toward functional and medical goals. FIM: FIM - Bathing Bathing Steps Patient Completed: Chest, Right Arm, Left Arm, Abdomen, Left upper leg, Right upper leg, Front perineal area, Right lower leg (including foot), Left lower leg (including foot) Bathing: 4: Min-Patient completes 8-9 68f 10 parts or 75+ percent  FIM - Upper Body Dressing/Undressing Upper body dressing/undressing steps patient completed: Thread/unthread right sleeve of pullover shirt/dresss, Thread/unthread left sleeve of pullover shirt/dress, Put head through opening of pull over shirt/dress, Pull shirt over trunk Upper body dressing/undressing: 5: Supervision: Safety issues/verbal cues FIM - Lower Body Dressing/Undressing Lower body dressing/undressing steps patient completed: Thread/unthread right underwear leg, Thread/unthread left underwear leg, Pull underwear up/down, Thread/unthread right pants leg, Thread/unthread left pants leg, Pull pants up/down, Don/Doff right sock, Don/Doff left sock, Don/Doff right shoe, Don/Doff left shoe, Fasten/unfasten right shoe, Fasten/unfasten left shoe Lower body dressing/undressing: 4: Steadying Assist     FIM - Radio producer Devices: Grab bars Toilet Transfers: 5-To toilet/BSC: Supervision (verbal cues/safety issues)  FIM - Control and instrumentation engineer Devices: Best boy: 5: Bed > Chair or W/C: Supervision (verbal cues/safety issues), 5: Chair or W/C > Bed: Supervision (verbal cues/safety issues)  FIM - Locomotion: Wheelchair Distance: 175 Locomotion: Wheelchair: 5: Travels 150 ft or more: maneuvers on rugs and over door sills with supervision,  cueing or coaxing FIM - Locomotion: Ambulation Locomotion: Ambulation Assistive Devices: Journalist, newspaper Ambulation/Gait Assistance: 4: Min guard, 5: Supervision Locomotion: Ambulation: 1: Travels less than 50 ft with minimal assistance (Pt.>75%)  Comprehension Comprehension Mode: Auditory Comprehension: 6-Follows complex conversation/direction: With extra time/assistive device  Expression Expression Mode: Verbal Expression: 4-Expresses basic 75 - 89% of the time/requires cueing 10 - 24% of the time. Needs helper to occlude trach/needs to repeat words.  Social Interaction Social Interaction: 6-Interacts appropriately with others with medication or extra time (anti-anxiety, antidepressant).  Problem Solving Problem Solving: 5-Solves complex 90% of the time/cues < 10% of the time  Memory Memory: 5-Recognizes or recalls 90% of the time/requires cueing < 10% of the time  Medical Problem List and Plan: 1. Functional deficits secondary to MG crises with respiratory failure.  2. DVT Prophylaxis/Anticoagulation: Pharmaceutical: Xarelto 3. Pain Management: Tylenol prn 4. Mood: Team to provide ego support. LCSW to follow for evaluation and support.  5. Neuropsych: This patient is capable of making decisions on her own behalf. 6. Skin/Wound Care: routine pressure relief measures. Maintain adequate nutrition and hydration status.  7. Fluids/Electrolytes/Nutrition: Continue TF, tolerating bolus feeds.   questran for bulking. Continue water flushes every 4 hours.    8. Chest pain with acute respiratory failure: Likely due to procedure with sedation superimposed on recent MG crises. Encourage IS  9. A fib: Monitor heart rate every 8 hours. Continue amiodarone and Xarelto.  10. Hypokalemia: continue supplement and recheck level today 12. MG crises: On prednisone 60 mg/daily and mestinon 90 mg qid (increased few days PTA). Monitor for recovery past PLEX.  13. Continued oro-pharyngeal  dysphagia: NPO except for oral trials with ST. Continue tube feeds via PEG.   -may need to loosen flange on g tube  -pain improved overall 14. C diff colitis: Has completed treatment with resolution of diarrhea.  15. Hyperglycemia: may need to augment regimen  -starlix started Tuesday---observe today  -? scheduled insulin  LOS (Days) 3 A FACE TO FACE EVALUATION WAS PERFORMED  SWARTZ,ZACHARY T 01/01/2015 9:17 AM

## 2015-01-01 NOTE — Progress Notes (Signed)
Speech Language Pathology Discharge Summary  Patient Details  Name: Craig [name] MRN: [id number] Date of Birth: 11-16-1940   Patient has met 3 of 3 long term goals.  Patient to discharge at overall Supervision level.  Reasons goals not met: n/a   Clinical Impression/Discharge Summary:  Pt made functional gains while inpatient and has met 3 out of 3 long term goals. Pt is currently supervision for participation in dysphagia therapy and use of diaphragmatic breathing techniques to improve breath support for speech intelligibility at the phrase level.  Pt is currently NPO with PEG due to continued overt s/s of aspiration with all PO trials; therefore, pt would benefit from follow up speech therapy at discharge in order to maximize swallowing function and facilitate return to previous level of function.  Pt will also need repeat objective swallow study prior to initiation of PO diet due to significant oropharyngeal weakness resulting in gross aspiration and/or penetration of all consistencies.  Pt and family education is complete at this time.  Pt is discharging home with 24/7 supervision from family.     Care Partner:  Caregiver Able to Provide Assistance: Yes     Recommendation:  Outpatient SLP;Home Health SLP;24 hour supervision/assistance  Rationale for SLP Follow Up: Maximize swallowing safety;Reduce caregiver burden;Maximize functional communication   Equipment: oral suctioning   Reasons for discharge: Discharged from hospital   Patient/Family Agrees with Progress Made and Goals Achieved: Yes   See FIM for current functional status  PageSelinda Orion 01/01/2015, 7:47 PM

## 2015-01-01 NOTE — Progress Notes (Signed)
Physical Therapy Session Note  Patient Details  Name: Craig [name] MRN: [id number] Date of Birth: 12-11-1940  Today's Date: 01/01/2015 PT Individual Time: 1115-1130 PT Individual Time Calculation (min): 15 min   Short Term Goals: Week 1:  PT Short Term Goal 1 (Week 1): =STG due to ELOS  Skilled Therapeutic Interventions/Progress Updates:   Pt presents on toilet (wife has been checked off on transfers in the room and provided appropriate assist) and missed first 15 min of session due to BM. Plan to make this up at end of session scheduled with this therapist this afternoon if possible.   Focused on d/c assessment (see d/c summary for details), bed mobility , transfers, and gait with SPC within the room to simulate household mobility. Pt performing transfers at mod I level using SPC as well as mod I bed mobility from flat bed without rails to simulate home environment. Pt and wife deny any concerns in regards to d/c tomorrow from PT/mobility standpoint. Educated on importance of continued activity and follow up HHPT to continue to address overall strength and endurance from a functional mobility stand point. Pt on 2L O2 throughout session.   Therapy Documentation Precautions:  Precautions Precautions: Fall, Other (comment) Precaution Comments: de-sats with activity, PEG tube Restrictions Weight Bearing Restrictions: No General: PT Amount of Missed Time (min): 15 Minutes PT Missed Treatment Reason: Toileting  Pain: Denies pain.  See FIM for current functional status  Therapy/Group: Individual Therapy  [name] [facility] 01/01/2015, 11:39 AM

## 2015-01-01 NOTE — Progress Notes (Signed)
Speech Language Pathology Daily Session Note  Patient Details  Name: Craig [name] MRN: [id number] Date of Birth: 07/08/1941  Today's Date: 01/01/2015 SLP Individual Time: 1000-1030 SLP Individual Time Calculation (min): 30 min  Short Term Goals: Week 1: SLP Short Term Goal 1 (Week 1): short term goals = long term goals  Skilled Therapeutic Interventions:  Pt was seen for skilled ST targeting goals for diaphragmatic breathing.  Upon arrival, pt was seated upright in recliner, receiving tube feeds from Advertising copywriter.  SLP facilitated the session with skilled re-education related to diaphragmatic breathing techniques to improve breath support for speech intelligibility at the phrase level.  Pt requires overall min assist faded to supervision cues to return demonstration of breathing techniques in isolation as well as during structured tasks where voicing and vocal intensity was targeted (i.e. Counting from 1-10).   His use of diaphragmatic breathing techniques improved his speech intelligibility from ~60% to 90% in context.  Wife and pt verbalized understanding of education.     FIM:  Comprehension Comprehension Mode: Auditory Comprehension: 6-Follows complex conversation/direction: With extra time/assistive device Expression Expression Mode: Verbal Expression: 5-Expresses basic 90% of the time/requires cueing < 10% of the time. Social Interaction Social Interaction: 6-Interacts appropriately with others with medication or extra time (anti-anxiety, antidepressant). Problem Solving Problem Solving: 5-Solves complex 90% of the time/cues < 10% of the time Memory Memory: 5-Recognizes or recalls 90% of the time/requires cueing < 10% of the time   Pain Pain Assessment Pain Assessment: No/denies pain  Therapy/Group: Individual Therapy  Twyla Dais, Selinda Orion 01/01/2015, 3:36 PM

## 2015-01-01 NOTE — Progress Notes (Signed)
Occupational Therapy Session Note  Patient Details  Name: Craig [name] MRN: [id number] Date of Birth: March 06, 1941  Today's Date: 01/01/2015 OT Individual Time: 1300-1330 OT Individual Time Calculation (min): 30 min    Short Term Goals: Week 1:  OT Short Term Goal 1 (Week 1): LTG=STG  Skilled Therapeutic Interventions/Progress Updates:    Pt seen for OT session focusing on functional mobility and transfers. Pt on toilet upon arrival, having BM. Pt able to stand from toilet and manage clothing with supervision. He required assist for buttock hygiene for thourghness. Pt taken to OT apartment in w/[name] to practice functional transfers. Pt demonstrated ability to step over mock shower step, ~3 inches with supervision. Pt then performed sit<>stand from low couch with supervision and use of SPC. Pt performed functional mobility ~100 yards with SPC and supervision without LOB.   Pt initially on 2L of O2 with 97%. Pt taken off O2 and was 95% with light activity. Pt  88% on following ~2 minutes of functional ambulation, however, recovered to 94% after 30 second seated rest break and 97% after ~1 minute rest.    Pt returned to room at end of session. Pt left in w/[name] with O2 applied, wife present and all needs in reach.    Pt and caregiver educated regarding benefits of mobility, need for assist/ supervision during functional mobility in the bathroom, and deep breathing techniques.   Therapy Documentation Precautions:  Precautions Precautions: Fall, Other (comment) Precaution Comments: de-sats with activity, PEG tube Restrictions Weight Bearing Restrictions: No General: General PT Missed Treatment Reason: Toileting Vital Signs: Therapy Vitals Pulse Rate: 80 BP: 98/68 mmHg Pain:  Voiced no complaints of pain ADL: ADL ADL Comments: see FIM  See FIM for current functional status  Therapy/Group: Individual Therapy  Craig [name], Craig [name] 01/01/2015, 2:59 PM

## 2015-01-01 NOTE — Patient Care Conference (Signed)
Inpatient RehabilitationTeam Conference and Plan of Care Update Date: 12/30/2014   Time: 2:00 PM    Patient Name: Craig [name]      Medical Record Number: [id number]  Date of Birth: [birth year] Sex: Male         Room/Bed: 4W20C/4W20C-01 Payor Info: Payor: [employer] / Plan: [employer] / Product Type: *No Product type* /    Admitting Diagnosis: MYASTHENIA GRAVIS  Admit Date/Time:  12/29/2014  5:50 PM Admission Comments: No comment available   Primary Diagnosis:  MG, crisis (myasthenia gravis) Principal Problem: MG, crisis (myasthenia gravis)  Patient Active Problem List   Diagnosis Date Noted  . MG with exacerbation (myasthenia gravis) 12/29/2014  . Protein-calorie malnutrition, severe   . Esophageal reflux   . Chest pain 12/27/2014  . Chronic atrial fibrillation 12/27/2014  . Pain in the chest   . Diaphoresis   . Bradycardia   . Dysphagia   . MG, crisis (myasthenia gravis) 12/17/2014  . Dysphagia, pharyngoesophageal phase   . Enteritis due to Clostridium difficile   . Clostridium difficile diarrhea 12/15/2014  . Hypernatremia   . Acute exacerbation of myasthenia gravis   . Myasthenia gravis in crisis 12/05/2014  . Bladder cancer 12/05/2014  . HTN (hypertension) 12/05/2014  . A-fib 12/05/2014  . Chronic anticoagulation 12/05/2014  . Acute respiratory failure with hypoxia 12/05/2014    Expected Discharge Date: Expected Discharge Date: 01/02/15  Team Members Present: Physician leading conference: Dr. Alger Simons Social Worker Present: Alfonse Alpers, LCSW Nurse Present: Rayetta Pigg, RN PT Present: Raylene Everts, PT;Jess Florentina Jenny, PT OT Present: Willeen Cass, Olena Leatherwood, OT SLP Present: Weston Anna, SLP PPS Coordinator present : Daiva Nakayama, RN, CRRN     Current Status/Progress Goal Weekly Team Focus  Medical   chest/abdominal pain after g tube. oxygen needed still.  improve stamina,   wean oxygen, tube feed mgt and ed/  bolus feeds   Bowel/Bladder   Continent of bowel and bladder; LBM 2/15  Mod I assist  Maintain current regimen; assess and treat for constipation   Swallow/Nutrition/ Hydration   NPO with PEG tube.  Tolerating continuous feeds at 65/hr well.  Meds through PEG  Participate in dysphagia treatment with Min A  continue with trials of ice chips and diaphragmatic breathing, family education    ADL's   overall min A   supervision  d/c planning, activity tolerance and standing balance during functional activities   Mobility   Min Guard overall   S to mod I overall  endurance, ambulation tolerance, standing balance   Communication   Min assist   Supervision   increased vocal intensity, diaphragmatic breathing    Safety/Cognition/ Behavioral Observations  No unsafe behaviors noted         Pain   C/o left abdominal pain into shoulder- r/t air from PEG insertion  < 3  Assess and treat for pain q shift and prn   Skin   Abrasion to nose from tape where NG tube was- redness to sacrum; cracked heels  No skin breakdown or infection with mod assist  Assess skin q shift and prn    Rehab Goals Patient on target to meet rehab goals: Yes Rehab Goals Revised: none *See Care Plan and progress notes for long and short-term goals.  Barriers to Discharge: pain, mentally drained    Possible Resolutions to Barriers:  family ed, continued re-conditioning,     Discharge Planning/Teaching Needs:  Pt plans to go home with his wife at  d/c.  She feels comfortable with the care pt may need at home.  Wife is receiving family education from nursing and therapy.   Team Discussion:  Pt is back from acute after it was determined chest pain was actually gastric pain after PEG.  Dr. Naaman Plummer will watch it and see if it needs to be loosened.  Pt is weak, but will be a short stay with the goal of getting him home by the end of the week.  With PT, pt's O2 is desaturating to 87/88 on room air with activity.  His using 2L of  O2.  OT gives him rest breaks and slows down and then pt can do more for himself.  OT reported that pt's wife feels he is apathetic and OT is concerned how he is coping.  RN has started PEG education and pt's urine collected and tested for strong odor and cloudy appearance.  Pt will need to switch O2 companies to use [facility] due to new insurance.  Revisions to Treatment Plan:  none   Continued Need for Acute Rehabilitation Level of Care: The patient requires daily medical management by a physician with specialized training in physical medicine and rehabilitation for the following conditions: Daily direction of a multidisciplinary physical rehabilitation program to ensure safe treatment while eliciting the highest outcome that is of practical value to the patient.: Yes Daily medical management of patient stability for increased activity during participation in an intensive rehabilitation regime.: Yes Daily analysis of laboratory values and/or radiology reports with any subsequent need for medication adjustment of medical intervention for : Post surgical problems;Neurological problems;Other  Raphaela Cannaday, Silvestre Mesi 01/01/2015, 1:26 PM

## 2015-01-01 NOTE — Progress Notes (Signed)
01/01/15 1900 nsg Pam PA  mentioned to RN to teach wife with insulin administration. Unable to teach wife she left home early per patient.

## 2015-01-01 NOTE — Progress Notes (Signed)
Physical Therapy Discharge Summary  Patient Details  Name: Craig [name] MRN: [id number] Date of Birth: 05-28-1941  Patient has met 7 of 7 long term goals due to improved activity tolerance, improved balance, improved postural control, increased strength and ability to compensate for deficits.  Patient to discharge at an ambulatory level Supervision household distances. Patient's care partner is independent to provide the necessary physical and supervision assistance at discharge.  Reasons goals not met: Community gait goal marked as n/a due to not having an opportunity to address this during stay.  Recommendation:  Patient will benefit from ongoing skilled PT services in home health setting to continue to advance safe functional mobility, address ongoing impairments in endurance, strength, gait, stairs, posture, and minimize fall risk.  Equipment: No equipment provided. Pt already owns [facility].  Reasons for discharge: treatment goals met and discharge from hospital  Patient/family agrees with progress made and goals achieved: Yes  PT Discharge Precautions/Restrictions Precautions Precautions: Fall;Other (comment) Precaution Comments: de-sats with activity, PEG tube Restrictions Weight Bearing Restrictions: No Cognition Overall Cognitive Status: Within Functional Limits for tasks assessed Safety/Judgment: Appears intact Sensation Sensation Light Touch: Appears Intact Proprioception: Appears Intact Coordination Gross Motor Movements are Fluid and Coordinated: Yes Motor  Motor Motor: Abnormal postural alignment and control Motor - Discharge Observations: still flexed posture and decreased neck extension but significantly improved since initial admission to CIR  Locomotion  Ambulation Ambulation/Gait Assistance: 5: Supervision  Trunk/Postural Assessment  Cervical Assessment Cervical Assessment: Exceptions to [facility] (maintains flexed posture. Can correct with cues. Dec  enduran) Thoracic Assessment Thoracic Assessment: Exceptions to [facility] (flexed posture) Lumbar Assessment Lumbar Assessment: Exceptions to [facility] (posterior pelvic tilt in sitting) Postural Control Head Control:  remains forward flexed; requires cues to achieve upright position  Balance Balance Balance Assessed: Yes Static Sitting Balance Static Sitting - Level of Assistance: 6: Modified independent (Device/Increase time) Dynamic Sitting Balance Dynamic Sitting - Level of Assistance: 6: Modified independent (Device/Increase time) Static Standing Balance Static Standing - Level of Assistance: 6: Modified independent (Device/Increase time) (with UE support) Dynamic Standing Balance Dynamic Standing - Level of Assistance: 5: Stand by assistance;6: Modified independent (Device/Increase time) Extremity Assessment      RLE Assessment RLE Assessment: Within Functional Limits (decreased muscular endurance) LLE Assessment LLE Assessment: Within Functional Limits (decreased muscular endurance)  See FIM for current functional status  [name] [facility] 01/01/2015, 4:09 PM

## 2015-01-01 NOTE — Progress Notes (Signed)
Social Work Discharge Note  The overall goal for the admission was met for:   Discharge location: Yes - home  Length of Stay: Yes - 4 days  Discharge activity level: Yes - modified independent with some supervision  Home/community participation: Yes  Services provided included: MD, RD, PT, OT, SLP, RN, Pharmacy, Neuropsych and SW  Financial Services: Private Insurance: [employer]  Follow-up services arranged: Home Health: RN/PT/OT/ST from [facility] and DME: Shower seat with back; oxygen; suctioning; tube feeding from [facility] (or additional information):  Pt to go home with his wife and home health.  He will have tube feedings and suctioning at home.  Patient/Family verbalized understanding of follow-up arrangements: Yes  Individual responsible for coordination of the follow-up plan:  Pt's wife  Confirmed correct DME delivered: Trey Sailors 01/01/2015    Zimere Dunlevy, Silvestre Mesi

## 2015-01-02 LAB — GLUCOSE, CAPILLARY: GLUCOSE-CAPILLARY: 95 mg/dL (ref 70–99)

## 2015-01-02 MED ORDER — INSULIN ASPART 100 UNIT/ML FLEXPEN: 0.0000 [IU] | PEN_INJECTOR | Freq: Four times a day (QID) | SUBCUTANEOUS | Status: AC

## 2015-01-02 MED ORDER — JEVITY 1.2 CAL PO LIQD: 390.0000 mL | Freq: Three times a day (TID) | ORAL | Status: DC

## 2015-01-02 MED ORDER — SENNA 8.6 MG PO TABS: 1.0000 | ORAL_TABLET | Freq: Every day | ORAL | Status: DC | PRN

## 2015-01-02 MED ORDER — NATEGLINIDE 120 MG PO TABS: 120.0000 mg | ORAL_TABLET | Freq: Three times a day (TID) | ORAL | Status: DC

## 2015-01-02 MED ORDER — ONDANSETRON HCL 4 MG PO TABS: 4.0000 mg | ORAL_TABLET | Freq: Four times a day (QID) | ORAL | Status: DC | PRN

## 2015-01-02 MED ORDER — PREDNISONE 20 MG PO TABS: 60.0000 mg | ORAL_TABLET | Freq: Every day | ORAL | Status: AC

## 2015-01-02 MED ORDER — FREE WATER: 150.0000 mL | Freq: Three times a day (TID) | Status: DC

## 2015-01-02 MED ORDER — CETYLPYRIDINIUM CHLORIDE 0.05 % MT LIQD: 7.0000 mL | Freq: Four times a day (QID) | OROMUCOSAL | Status: DC

## 2015-01-02 MED ORDER — PYRIDOSTIGMINE BROMIDE 60 MG PO TABS: 90.0000 mg | ORAL_TABLET | Freq: Four times a day (QID) | ORAL | Status: DC

## 2015-01-02 MED ORDER — CETYLPYRIDINIUM CHLORIDE 0.05 % MT LIQD: 7.0000 mL | Freq: Two times a day (BID) | OROMUCOSAL | Status: DC

## 2015-01-02 MED ORDER — FAMOTIDINE 40 MG/5ML PO SUSR: 40.0000 mg | Freq: Every day | ORAL | Status: AC

## 2015-01-02 MED ORDER — POTASSIUM CHLORIDE 20 MEQ/15ML (10%) PO SOLN: 20.0000 meq | Freq: Three times a day (TID) | ORAL | Status: DC

## 2015-01-02 MED ORDER — CEPHALEXIN 250 MG/5ML PO SUSR: 250.0000 mg | Freq: Three times a day (TID) | ORAL | Status: DC

## 2015-01-02 NOTE — Progress Notes (Signed)
Occupational Therapy Discharge Summary  Patient Details  Name: Craig [name] MRN: [id number] Date of Birth: 1941/08/30   Patient has met 8 of 9 long term goals due to improved activity tolerance, improved balance, postural control, ability to compensate for deficits and improved coordination.  Patient to discharge at overall Supervision level.  Patient's care partner is independent to provide the necessary physical assistance at discharge.    Reasons goals not met: Pt requires distant supervision for toileting tasks.  Recommendation:  Patient with no further OT needs at this time.  Patient will require supervision for functional mobility. Patient's wife is willing and able to provide needed level of assistance. Caregiver education provided with caregiver voicing and demonstrating understanding of ability to give needed level of assist.   Equipment: Shower Chair  Reasons for discharge: treatment goals met and discharge from hospital  Patient/family agrees with progress made and goals achieved: Yes  OT Discharge Precautions/Restrictions  Precautions Precautions: Fall;Other (comment) Precaution Comments: de-sats with activity, PEG tube Restrictions Weight Bearing Restrictions: No ADL ADL Eating: NPO Grooming: Minimal assistance (Pt requested assist with shaving and oral care for thoroughness, hoever, physically able to complete task) Where Assessed-Grooming: Sitting at sink Upper Body Bathing: Setup Where Assessed-Upper Body Bathing: Shower (seated in shower chair) Lower Body Bathing: Setup, Supervision/safety Where Assessed-Lower Body Bathing: Standing at sink Upper Body Dressing: Supervision/safety, Setup Where Assessed-Upper Body Dressing: Wheelchair Lower Body Dressing: Setup (with use of step stool for easier access to LEs) Where Assessed-Lower Body Dressing: Wheelchair Toileting: Supervision/safety Where Assessed-Toileting: Glass blower/designer: Close supervision Hydrographic surveyor Method: Magazine features editor: Close supervision Social research officer, government Method: Ambulating (Ambulating with SPC) Advertising account executive without back ADL Comments: see FIM Vision/Perception  Vision- History Baseline Vision/History: Wears glasses Wears Glasses: Reading only Patient Visual Report: No change from baseline Vision- Assessment Vision Assessment?: No apparent visual deficits  Cognition Overall Cognitive Status: Within Functional Limits for tasks assessed Arousal/Alertness: Awake/alert Orientation Level: Oriented X4 Attention: Focused Focused Attention: Appears intact Memory: Appears intact Awareness: Appears intact Problem Solving: Appears intact Safety/Judgment: Appears intact Sensation Sensation Light Touch: Appears Intact Proprioception: Appears Intact Coordination Gross Motor Movements are Fluid and Coordinated: Yes Fine Motor Movements are Fluid and Coordinated: Yes Motor  Motor Motor: Abnormal postural alignment and control Motor - Skilled Clinical Observations: Decreased strength in head and neck  Motor - Discharge Observations: Requires cues to obtain and maintain upright sitting/ standing posture during functional tasks. Improved from initital eval in IPR Mobility  Bed Mobility Bed Mobility: Supine to Sit Supine to Sit: 5: Supervision Transfers Transfers: Sit to Stand;Stand to Sit Sit to Stand: 5: Supervision Stand to Sit: 5: Supervision  Trunk/Postural Assessment  Cervical Assessment Cervical Assessment: Exceptions to [facility] Cervical AROM Overall Cervical AROM Comments: decr ability to turn to his left (maybe limited by central line) Cervical Strength Overall Cervical Strength Comments: head flexed forward and difficulty sustaining upright posture/ position; can self-correct with VCs Thoracic Assessment Thoracic Assessment: Exceptions to [facility] Thoracic Strength Overall Thoracic Strength Comments: flexed posture  while seated Postural Control Postural Control: Deficits on evaluation Head Control:  remains forward flexed; requires cues to achieve upright position and demonstrates difficulty maintaining position  Balance Balance Balance Assessed: Yes Static Sitting Balance Static Sitting - Level of Assistance: 6: Modified independent (Device/Increase time) Dynamic Sitting Balance Dynamic Sitting - Balance Support: Feet supported;During functional activity Dynamic Sitting - Level of Assistance: 6: Modified independent (Device/Increase time) Dynamic Sitting - Balance Activities: Lateral  lean/weight shifting;Forward lean/weight shifting;Reaching across midline Sitting balance - Comments: Pt demonstrates good static sitting balance during functional activities. Pt with decreased activity tolerance, requiring supported seated rest breaks throughout functional upright sitting tasks.  Static Standing Balance Static Standing - Balance Support: During functional activity Static Standing - Level of Assistance: 6: Modified independent (Device/Increase time) Dynamic Standing Balance Dynamic Standing - Level of Assistance: 5: Stand by assistance;6: Modified independent (Device/Increase time) Extremity/Trunk Assessment RUE Assessment RUE Assessment: Exceptions to [facility] RUE AROM (degrees) RUE Overall AROM Comments: shoulder 100 flexion ; elbow and hand WFL RUE Strength RUE Overall Strength Comments: 4/5  difficulty with sustaining during a functional activity against gravity LUE Assessment LUE Assessment: Exceptions to [facility] LUE AROM (degrees) LUE Overall AROM Comments: WFL  LUE Strength LUE Overall Strength Comments: 4/5  See FIM for current functional status  Marke, Harlyn Italiano C 01/02/2015, 7:55 AM

## 2015-01-02 NOTE — Plan of Care (Signed)
Problem: RH Toilet Transfers Goal: LTG Patient will perform toilet transfers w/assist (OT) LTG: Patient will perform toilet transfers with assist, with/without cues using equipment (OT)  Outcome: Not Met (add Reason) Pt requires supervision for standing toileting tasks

## 2015-01-02 NOTE — Progress Notes (Signed)
[facility] PHYSICAL MEDICINE & REHABILITATION     PROGRESS NOTE    Subjective/Complaints: Asked if he can sleep with his head down. Feeling well. Ready to go home.   Objective: Vital Signs: Blood pressure 111/57, pulse 63, temperature 97.5 F (36.4 C), temperature source Oral, resp. rate 18, height 5\' 7"  (1.702 m), weight 98.6 kg (217 lb 6 oz), SpO2 97 %. No results found. No results for input(s): WBC, HGB, HCT, PLT in the last 72 hours.  Recent Labs  01/01/15 0817  NA 143  K 3.4*  CL 104  GLUCOSE 92  BUN 15  CREATININE 0.59  CALCIUM 8.8   CBG (last 3)   Recent Labs  01/01/15 1647 01/01/15 2043 01/02/15 0638  GLUCAP 304* 180* 95    Wt Readings from Last 3 Encounters:  01/02/15 98.6 kg (217 lb 6 oz)  12/29/14 106.006 kg (233 lb 11.2 oz)  12/17/14 107.4 kg (236 lb 12.4 oz)    Physical Exam:  Constitutional: He appears well-developed and well-nourished. Appears fatigued still    HENT: BiBap mask secured, mild irritation over nose Head: Normocephalic and atraumatic.  Eyes: Left conjunctiva somewhat injected.  Ptosis improved  Cardiovascular: Normal rate and regular rhythm.  Respiratory: Effort normal. No respiratory distress. He has no wheezes. He has rhonchi.  GI: Soft. Bowel sounds are normal. He exhibits no distension. There is tenderness near g tube---synched down quite tightly Musculoskeletal: He exhibits no edema or tenderness.  Neurological: He is alert.  Facial diplegia improving. Cough and oro-motor control is better..voice still lacks volume Followed motor commands without difficulty. Reasonable insight and awareness. Sensory exam intact to PP and LT. Strength 4/5 UE's bilaterally prox to distal. LE's4/5 hf, 4+/5ke, 4+ankles.  Skin: Skin is warm and dry.  Psychiatric: He has a normal mood and affect. His behavior is normal. Good insight and awareness   Assessment/Plan: 1. Functional deficits secondary to Myasthenia gravis which require  3+ hours per day of interdisciplinary therapy in a comprehensive inpatient rehab setting. Physiatrist is providing close team supervision and 24 hour management of active medical problems listed below. Physiatrist and rehab team continue to assess barriers to discharge/monitor patient progress toward functional and medical goals. FIM: FIM - Bathing Bathing Steps Patient Completed: Chest, Right Arm, Left Arm, Abdomen, Left upper leg, Right upper leg, Front perineal area, Right lower leg (including foot), Left lower leg (including foot) Bathing: 5: Supervision: Safety issues/verbal cues  FIM - Upper Body Dressing/Undressing Upper body dressing/undressing steps patient completed: Thread/unthread right sleeve of pullover shirt/dresss, Thread/unthread left sleeve of pullover shirt/dress, Put head through opening of pull over shirt/dress, Pull shirt over trunk Upper body dressing/undressing: 5: Set-up assist to: Obtain clothing/put away FIM - Lower Body Dressing/Undressing Lower body dressing/undressing steps patient completed: Thread/unthread right underwear leg, Thread/unthread left underwear leg, Pull underwear up/down, Thread/unthread right pants leg, Thread/unthread left pants leg, Pull pants up/down, Don/Doff right sock, Don/Doff left sock, Don/Doff right shoe, Don/Doff left shoe, Fasten/unfasten right shoe, Fasten/unfasten left shoe Lower body dressing/undressing: 5: Set-up assist to: Obtain clothing  FIM - Toileting Toileting steps completed by patient: Adjust clothing prior to toileting, Adjust clothing after toileting Toileting Assistive Devices: Grab bar or rail for support Toileting: 3: Mod-Patient completed 2 of 3 steps  FIM - Radio producer Devices: [employer], Product manager Transfers: 5-To toilet/BSC: Supervision (verbal cues/safety issues)  FIM - Control and instrumentation engineer Devices: Best boy: 6: Bed > Chair or W/C: No  assist,  6: Chair or W/C > Bed: No assist  FIM - Locomotion: Wheelchair Distance: 175 Locomotion: Wheelchair: 5: Travels 50 - 149 ft, turns around, maneuvers to table, bed or toilet, negotiates 3% grade: modified independent FIM - Locomotion: Ambulation Locomotion: Ambulation Assistive Devices: Journalist, newspaper Ambulation/Gait Assistance: 5: Supervision Locomotion: Ambulation: 2: Travels 50 - 149 ft with supervision/safety issues  Comprehension Comprehension Mode: Auditory Comprehension: 6-Follows complex conversation/direction: With extra time/assistive device  Expression Expression Mode: Verbal Expression: 5-Expresses complex 90% of the time/cues < 10% of the time  Social Interaction Social Interaction: 6-Interacts appropriately with others with medication or extra time (anti-anxiety, antidepressant).  Problem Solving Problem Solving: 5-Solves complex 90% of the time/cues < 10% of the time  Memory Memory: 5-Recognizes or recalls 90% of the time/requires cueing < 10% of the time  Medical Problem List and Plan: 1. Functional deficits secondary to MG crises with respiratory failure.  2. DVT Prophylaxis/Anticoagulation: Pharmaceutical: Xarelto 3. Pain Management: Tylenol prn 4. Mood: Team to provide ego support. LCSW to follow for evaluation and support.  5. Neuropsych: This patient is capable of making decisions on her own behalf. 6. Skin/Wound Care: routine pressure relief measures. Maintain adequate nutrition and hydration status.  7. Fluids/Electrolytes/Nutrition: Continue TF, tolerating bolus feeds.   questran for bulking. Continue water flushes every 4 hours.    8. Chest pain with acute respiratory failure: Likely due to procedure with sedation superimposed on recent MG crises. Encourage IS  9. A fib: Monitor heart rate every 8 hours. Continue amiodarone and Xarelto.  10. Hypokalemia: continue supplement upon discharge 12. MG crises: On prednisone 60 mg/daily and  mestinon 90 mg qid (increased few days PTA). Monitor for recovery past PLEX.  13. Continued oro-pharyngeal dysphagia: NPO except for oral trials with ST. Continue tube feeds via PEG.   -pain improved overall 14. C diff colitis: Has completed treatment with resolution of diarrhea.  15. Hyperglycemia: may need to augment regimen  -starlix started Tuesday---resume home starlix  -ssi, glucophage?   LOS (Days) 4 A FACE TO FACE EVALUATION WAS PERFORMED  SWARTZ,ZACHARY T 01/02/2015 8:11 AM

## 2015-01-02 NOTE — Discharge Summary (Signed)
Physician Discharge Summary  Patient ID: Craig [name] MRN: [id number] DOB/AGE: 75-Nov-1942 75 y.o.  Admit date: 12/29/2014 Discharge date: 01/02/2015  Discharge Diagnoses:  Principal Problem:   MG, crisis (myasthenia gravis) Active Problems:   Bladder cancer   Clostridium difficile diarrhea   Dysphagia, pharyngoesophageal phase   MG with exacerbation (myasthenia gravis)   Discharged Condition: Stable.    Labs:  Basic Metabolic Panel:  Recent Labs Lab 12/27/14 0300 12/27/14 0800 12/29/14 0350 12/30/14 0550 01/01/15 0817  NA 139 138 140 141 143  K 4.1 3.6 3.1* 3.0* 3.4*  CL 103 99 99 99 104  CO2 32 31 33* 31 30  GLUCOSE 170* 123* 197* 172* 92  BUN 7 9 12 16 15   CREATININE 0.67 0.60 0.68 0.64 0.59  CALCIUM 8.5 8.3* 8.3* 8.6 8.8    CBC:  Recent Labs Lab 12/27/14 0300 12/27/14 0800 12/29/14 0350 12/30/14 0550  WBC 11.1* 11.3* 15.8* 12.0*  NEUTROABS 10.1*  --   --  9.5*  HGB 13.1 13.3 13.6 13.1  HCT 39.3 39.8 40.4 38.8*  MCV 94.7 93.2 93.1 93.0  PLT 202 213 201 238    CBG:  Recent Labs Lab 01/01/15 1206 01/01/15 1410 01/01/15 1647 01/01/15 2043 01/02/15 0638  GLUCAP 300* 278* 304* 180* 95    Brief HPI:   Craig [name] is a 74 y.o. male with history of A fib, metastatic bladder cancer, recent PNA, MG who has had worsening over past months. He was evaluated by neurology 3 days PTA and was placed on steroids with increase in mestinon due to difficulty swallowing and SOB. Patient continued to worsen and was admitted via [place] on 12/05/14 with SOB, swallowing difficulty, difficulty hold head up with droopy eyelids and congestion. s. He was intubated due to respiratory distress and started on IV solumedrol as well as plasmapheresis per input by Dr. Doy Mince. He has responded to treatment and tolerated extubation on 01/25 with use of BIPAP prn. He is able to suction oral secretions and is currently NPO with tube feeds for nutritional support. He was treated with PLEX  X 6 days with improvement in SOB. He was transferred to [facility] 12/17/14 and has been making good progress with in therapy with improvement in activity tolerance, decrease secretions as well as improvement in oxygenation. Repeat MBS showed poor recovery of swallow function therefore PEG was placed by Dr. Vernard Gambles on 12/26/14. Later that evening patient developed mid sternal chest pain radiating to left shouder and cardiac enzymes as well as EKG was done for workup. CTA chest was negative for PTX, effusions or PE and small amount of air seen anterior due to procedure. Cardiac workup negative and no recurrent chest pain episodes reported. Patient was transferred back to [facility] to complete his rehab program.    Hospital Course: [name] was admitted to rehab 12/29/2014 for inpatient therapies to consist of PT, ST and OT at least three hours five days a week. Past admission physiatrist, therapy team and rehab RN have worked together to provide customized collaborative inpatient rehab. He was kept NPO and has been transitioned to bolus tube feeds without difficulty. No recurrent diarrhea reported. Blood sugars have been monitored on qid basis and starlix was resumed and increased to home dose. SSI was used for elevated BS and wife has been educated on continuing this past discharge. She is to contact primary MD if BS tend to run over 300 in 24 hour period.   PEG site is healing well without signs or  symptoms of infection.  Nursing reported strong odor of urine and CBC showed evidence of leucocytosis. UCS revealed >100,000 colonies of E coli. He was started on keflex for three day course antibiotic regimen prior to discharge. Patient and wife were instructed to monitor for any loose stools or GI issues and contact primary D for concerns of recurrent C diff.   He continues on 60 mg prednisone daily and is to follow up with neurology for slow taper past discharge. Hypokalemia has been supplemented and recommend recheck  lytes in a week by PMD.  Neuropsychology has followed up for evaluation of mood as well as cognition. Patient denied any symptoms of mood disruption and no significant cognitive disruption noted.   He has had improvement in voice volume, decrease in oral secretions as well as improvement in facial weakness.  He continues to have overt signs and symptoms of aspiration with all po trials and is to continue NPO status.  He continues to require oxygen with activity as well as when asleep due to OSA.  He has made good progress and is at supervision level overall. Wife has been very supportive and has been present for most therapy sessions. He will continue to receive follow up [place], [place], [place] and [place] by [facility] past discharge.    Rehab course: During patient's stay in rehab weekly team conferences were held to monitor patient's progress, set goals and discuss barriers to discharge. Patient has had improvement in activity tolerance, balance, postural control, as well as ability to compensate for deficits. He is modified independent for transfer, supervision for ambulating household distances and requires supervision with ADL tasks. He requires supervision for use of diaphragmatic breathing techniques to improve breath support for speech intelligibility at the phrase level as well as dysphagia therapy. He will  Require objective swallow evaluation prior to initiating diet. Family education was done with wife who will provide assistance needed past discharge.    Disposition: 01-Home or Self Care  Diet: Nothing by mouth.   Special Instructions: 1. If diarrhea recurs contact MD due to concerns of recurrent C diff.  2. Do oral care four time a day. 3. Stay upright for 45-60 minutes past bolus tube feed. Ok to sleep flat past that.  4. Check blood sugars four times a day before tube feeds and give novolog according to scale provided.  5. Use biotene at least twice a day between the Peridex  rinse.         Medication List    STOP taking these medications        acetaminophen 325 MG tablet  Commonly known as:  TYLENOL     aspirin 325 MG tablet     famotidine 40 MG tablet  Commonly known as:  PEPCID  Replaced by:  famotidine 40 MG/5ML suspension     Fish Oil 1000 MG Caps     glucosamine-chondroitin 500-400 MG tablet     insulin aspart 100 UNIT/ML injection  Commonly known as:  novoLOG  Replaced by:  insulin aspart 100 UNIT/ML FlexPen     IRON PO     morphine 2 MG/ML injection     multivitamin with minerals Tabs tablet     potassium chloride 10 MEQ tablet  Commonly known as:  K-DUR  Replaced by:  potassium chloride 20 MEQ/15ML (10%) Soln     vitamin C 1000 MG tablet     zinc gluconate 50 MG tablet      TAKE these medications  amiodarone 100 MG tablet  Commonly known as:  PACERONE  Place 1 tablet (100 mg total) into feeding tube daily.     antiseptic oral rinse 0.05 % Liqd solution  Commonly known as:  CPC / CETYLPYRIDINIUM CHLORIDE 0.05%  7 mLs by Mouth Rinse route 2 (two) times daily.     artificial tears Oint ophthalmic ointment  Place into both eyes every 4 (four) hours as needed for dry eyes.     atorvastatin 40 MG tablet  Commonly known as:  LIPITOR  Place 1 tablet (40 mg total) into feeding tube daily.     cephALEXin 250 MG/5ML suspension  Commonly known as:  KEFLEX  Place 5 mLs (250 mg total) into feeding tube every 8 (eight) hours.     chlorhexidine 0.12 % solution  Commonly known as:  PERIDEX  15 mLs by Mouth Rinse route 2 (two) times daily.     famotidine 40 MG/5ML suspension  Commonly known as:  PEPCID  Take 5 mLs (40 mg total) by mouth daily.     feeding supplement (JEVITY 1.2 CAL) Liqd  Place 390 mLs into feeding tube 4 (four) times daily -  with meals and at bedtime.     free water Soln  Place 150 mLs into feeding tube 4 (four) times daily - after meals and at bedtime.     insulin aspart 100 UNIT/ML FlexPen   Commonly known as:  NOVOLOG FLEXPEN  Inject 0-10 Units into the skin 4 (four) times daily. Use according to scale prior to tube feeds.  Notes to Patient:  Use following scale for blood sugar ranges: 150-200--use 3 units of insulin 200-250--use 6 units of insulin 250-300--use 9 units of insulin 300-350--use 12 units of insulin 350-400--use 15 units. If blood sugars are consistently over 350 call MD to help adjust above scale. Call MD if blood sugar over 400.      ipratropium 0.02 % nebulizer solution  Commonly known as:  ATROVENT  Inhale 2.5 mLs (0.5 mg total) into the lungs every 6 (six) hours as needed (wHEEZING.).     ipratropium 17 MCG/ACT inhaler  Commonly known as:  ATROVENT HFA  Inhale 1 puff into the lungs every 6 (six) hours as needed for wheezing.     metoprolol tartrate 25 MG tablet  Commonly known as:  LOPRESSOR  Place 0.5 tablets (12.5 mg total) into feeding tube daily.     nateglinide 120 MG tablet  Commonly known as:  STARLIX  Place 1 tablet (120 mg total) into feeding tube 3 (three) times daily.     ondansetron 4 MG tablet  Commonly known as:  ZOFRAN  Take 1 tablet (4 mg total) by mouth every 6 (six) hours as needed for nausea.     potassium chloride 20 MEQ/15ML (10%) Soln  Place 15 mLs (20 mEq total) into feeding tube 3 (three) times daily.     predniSONE 20 MG tablet  Commonly known as:  DELTASONE  Place 3 tablets (60 mg total) into feeding tube daily with breakfast.     pyridostigmine 60 MG tablet  Commonly known as:  MESTINON  1.5 tablets (90 mg total) by Per NG tube route 4 (four) times daily.     rivaroxaban 20 MG Tabs tablet  Commonly known as:  XARELTO  Take 1 tablet (20 mg total) by mouth daily with supper.     senna 8.6 MG Tabs tablet  Commonly known as:  SENOKOT  Place 1 tablet (8.6 mg total) into  feeding tube daily as needed for mild constipation.           Follow-up Information    Follow up with Ann Held, MD On 01/09/2015.  For  post hospital check, recheck BMET as well as evaluation of blood sugars.    Specialty:  Family Medicine   Why:  @ 11:10 AM   Contact information:   [place] [address] [phone]       Follow up with Meredith Staggers, MD On 02/10/2015.   Specialty:  Physical Medicine and Rehabilitation   Why:  Be there at 10:30  for  11 am appointment    Contact information:   [place]. [address] [place] [address] [phone]       Follow up with Marcial Pacas, MD On 01/08/2015.   Specialty:  Neurology   Why:  Be there at 9 am for 9:30 appointment   Contact information:   [place] [address] [phone]       Signed: Bary Leriche 01/02/2015, 5:05 PM

## 2015-01-02 NOTE — Discharge Instructions (Signed)
Inpatient Rehab Discharge Instructions  Craig [name] Discharge date and time:    Activities/Precautions/ Functional Status: Activity: activity as tolerated Diet: Nothing by mouth Wound Care: keep wound clean and dry   Functional status:  ___ No restrictions     ___ Walk up steps independently ___ 24/7 supervision/assistance   ___ Walk up steps with assistance ___ Intermittent supervision/assistance  ___ Bathe/dress independently ___ Walk with walker     ___ Bathe/dress with assistance ___ Walk Independently    ___ Shower independently ___ Walk with assistance    ___ Shower with assistance ___ No alcohol     ___ Return to work/school ________  COMMUNITY REFERRALS UPON DISCHARGE:   Home Health:   PT     OT     ST     RN     Agency:  [facility] Phone:  [phone] Medical Equipment/Items Ordered:  Tub seat with back; oxygen; tube feeding supplies; home suctioning  Agency/Supplier:  [employer]       Phone:  [phone]   Special Instructions: 1. If diarrhea recurs contact MD due to concerns of recurrent C diff.  2. Do oral care four time a day. 3. Stay upright for 45-60 minutes past bolus tube feed. Ok to sleep flat past that.  4. Check blood sugars four times a day before tube feeds and give novolog according to scale provided.  5. Use biotene at least twice a day between the Peridex rinse.    My questions have been answered and I understand these instructions. I will adhere to these goals and the provided educational materials after my discharge from the hospital.   Patient/Caregiver Signature _______________________________ Date __________  Clinician Signature _______________________________________ Date __________  Please bring this form and your medication list with you to all your follow-up doctor's appointments.

## 2015-01-02 NOTE — Progress Notes (Addendum)
OT Note - Addendum    Jan 14, 2015 1155  OT Visit Information  Last OT Received On 2015/01/14  OT G-codes **NOT FOR INPATIENT CLASS**  Functional Assessment Tool Used clniical judgement  Functional Limitation Self care  Self Care Current Status [phone]) CJ  Self Care Goal Status (M6004) CI  [facility], OTR/L  [phone] Jan 14, 2015

## 2015-01-08 ENCOUNTER — Encounter: Payer: Self-pay | Admitting: Neurology

## 2015-01-08 ENCOUNTER — Ambulatory Visit (INDEPENDENT_AMBULATORY_CARE_PROVIDER_SITE_OTHER): Payer: PPO | Admitting: Neurology

## 2015-01-08 VITALS — BP 116/74 | HR 98 | Ht 67.0 in | Wt 220.0 lb

## 2015-01-08 DIAGNOSIS — C7989 Secondary malignant neoplasm of other specified sites: Secondary | ICD-10-CM | POA: Insufficient documentation

## 2015-01-08 DIAGNOSIS — G7001 Myasthenia gravis with (acute) exacerbation: Secondary | ICD-10-CM

## 2015-01-08 DIAGNOSIS — C679 Malignant neoplasm of bladder, unspecified: Secondary | ICD-10-CM

## 2015-01-08 DIAGNOSIS — G7000 Myasthenia gravis without (acute) exacerbation: Secondary | ICD-10-CM | POA: Insufficient documentation

## 2015-01-08 NOTE — Progress Notes (Signed)
PATIENT: Craig [name] DOB: 1941-08-30  HISTORICAL  Craig [name] is a 74 yo RH WM is referred by his primary care physician Dr. Nicki Reaper for evaluation of recent hospital discharge, for myasthenia gravis exacerbation, he is accompanied by his wife, and daughter at today's clinical visit.  He had a history of atrial fibrillation, on Xalreto, diabetes, hypertension, bladder cancer diagnosed in 2014, underwent resection, followed by chemotherapy for metastatic lesion to his abdominal wall, finished chemotherapy in October 2015,  At baseline, he was highly functional, ambulate without difficulty, he was diagnosed with myasthenia gravis in 2014 by neurologist Dr. Metta Clines, presented with right ptosis only at that time, was given Mestinon, but never was given immunosuppressive treatment.  In January 2016, he noticed gradual worsening bilateral ptosis, dysphagia, dysarthria, generalized weakness, could not raise arm overhead, but December 05 2014, he has developed breathing difficulty, brought by ambulance to [facility], was intubated, diagnosed with myasthenia gravis exacerbation, require tube feeding, was treated with plasma exchange from January 20 third through February second, total of 6 treatment, along with prednisone 60 mg daily, Mestinon 90 mg 4 times a day, after third plasma exchange, he began to have some improvement, now his talking is eligible, still has profound dysphagia, generalized weakness, ptosis,  He was discharged from rehabilitation to home, wife is the main caregiver, he can ambulate without assistance, but still not eating by mouth.  CT chest   No evidence for pulmonary embolism. Limited evaluation of thesubsegmental pulmonary arteries due to motion artifact. Interval placement of gastrostomy tube. Small amount of free intraperitoneal air adjacent to the anterior margin the stomach, likely postprocedural in etiology.  CT abdomen: demonstrated omental nodularity, compatible with  metastatic disease. Celiac origin aneurysm, stable. Bilateral lower lobe atelectatic changes.  Lab normal TSH, A1c 6.9.  REVIEW OF SYSTEMS: Full 14 system review of systems performed and notable only for as above sleeping difficulty   ALLERGIES: No Known Allergies  HOME MEDICATIONS: Current Outpatient Prescriptions  Medication Sig Dispense Refill  . amiodarone (PACERONE) 100 MG tablet Place 1 tablet (100 mg total) into feeding tube daily.    Marland Kitchen antiseptic oral rinse (CPC / CETYLPYRIDINIUM CHLORIDE 0.05%) 0.05 % LIQD solution 7 mLs by Mouth Rinse route 2 (two) times daily. 946 mL 1  . artificial tears (LACRILUBE) OINT ophthalmic ointment Place into both eyes every 4 (four) hours as needed for dry eyes.  0  . atorvastatin (LIPITOR) 40 MG tablet Place 1 tablet (40 mg total) into feeding tube daily.    . cephALEXin (KEFLEX) 250 MG/5ML suspension Place 5 mLs (250 mg total) into feeding tube every 8 (eight) hours. 35 mL 0  . chlorhexidine (PERIDEX) 0.12 % solution 15 mLs by Mouth Rinse route 2 (two) times daily. 120 mL 0  . famotidine (PEPCID) 40 MG/5ML suspension Take 5 mLs (40 mg total) by mouth daily. 150 mL 0  . insulin aspart (NOVOLOG FLEXPEN) 100 UNIT/ML FlexPen Inject 0-10 Units into the skin 4 (four) times daily. Use according to scale prior to tube feeds. 15 mL 1  . ipratropium (ATROVENT HFA) 17 MCG/ACT inhaler Inhale 1 puff into the lungs every 6 (six) hours as needed for wheezing.    Marland Kitchen ipratropium (ATROVENT) 0.02 % nebulizer solution Inhale 2.5 mLs (0.5 mg total) into the lungs every 6 (six) hours as needed (wHEEZING.). 75 mL 12  . metoprolol tartrate (LOPRESSOR) 25 MG tablet Place 0.5 tablets (12.5 mg total) into feeding tube daily.    . nateglinide (STARLIX)  120 MG tablet Place 1 tablet (120 mg total) into feeding tube 3 (three) times daily. 90 tablet 1  . Nutritional Supplements (FEEDING SUPPLEMENT, JEVITY 1.2 CAL,) LIQD Place 390 mLs into feeding tube 4 (four) times daily -  with  meals and at bedtime.  0  . ondansetron (ZOFRAN) 4 MG tablet Take 1 tablet (4 mg total) by mouth every 6 (six) hours as needed for nausea. 20 tablet 0  . potassium chloride 20 MEQ/15ML (10%) SOLN Place 15 mLs (20 mEq total) into feeding tube 3 (three) times daily. 3800 mL 0  . predniSONE (DELTASONE) 20 MG tablet Place 3 tablets (60 mg total) into feeding tube daily with breakfast. 150 tablet 1  . pyridostigmine (MESTINON) 60 MG tablet 1.5 tablets (90 mg total) by Per NG tube route 4 (four) times daily. 135 tablet 1  . rivaroxaban (XARELTO) 20 MG TABS tablet Take 1 tablet (20 mg total) by mouth daily with supper.    . senna (SENOKOT) 8.6 MG TABS tablet Place 1 tablet (8.6 mg total) into feeding tube daily as needed for mild constipation. 120 each 0  . Water For Irrigation, Sterile (FREE WATER) SOLN Place 150 mLs into feeding tube 4 (four) times daily - after meals and at bedtime.     No current facility-administered medications for this visit.    PAST MEDICAL HISTORY: Past Medical History  Diagnosis Date  . Atrial fibrillation, chronic     on Xarelto   . HTN (hypertension)   . CHF (congestive heart failure)   . Diabetes mellitus   . 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 Myasthenia gravis     PAST SURGICAL HISTORY: Past Surgical History  Procedure Laterality Date  . Abdominal wall metastasis removal      FAMILY HISTORY: Family History  Problem Relation Age of Onset  . Colon cancer Mother   . Heart attack Father   . Diabetes Father     SOCIAL HISTORY:  History   Social History  . Marital Status: Married    Spouse Name: N/A  . Number of Children: 3  . Years of Education: 14   Occupational History  . Retired    Social History Main Topics  . Smoking status: Never Smoker   . Smokeless tobacco: Not on file  . Alcohol Use: No  . Drug Use: No  . Sexual Activity: Not on file   Other Topics Concern  . Not on file     Social History Narrative   Right-handed.   Lives at home with his wife.   No caffeine use.     PHYSICAL EXAM   Filed Vitals:   01/08/15 1417  BP: 116/74  Pulse: 98  Height: 5\' 7"  (1.702 m)  Weight: 220 lb (99.791 kg)    Not recorded      Body mass index is 34.45 kg/(m^2).  PHYSICAL EXAMNIATION:  Gen: NAD, conversant, well nourised, obese, well groomed                     Cardiovascular: Regular rate rhythm, no peripheral edema, warm, nontender. Eyes: Conjunctivae clear without exudates or hemorrhage Neck: Supple, no carotid bruise. Pulmonary:  poor air movements at the base of his lung   NEUROLOGICAL EXAM:  MENTAL STATUS: Speech: Profound dysarthria Cognition:    The patient is oriented to person, place, and time;     recent and remote memory intact;  language fluent;     normal attention, concentration,     fund of knowledge.  CRANIAL NERVES: CN II: Visual fields are full to confrontation. Fundoscopic exam is normal with sharp discs and no vascular changes. Venous pulsations are present bilaterally. Pupils are 4 mm and briskly reactive to light. Visual acuity is 20/20 bilaterally. CN III, IV, VI: he has significant right ptosis, left eye abduction weakness CN V: Facial sensation is intact to pinprick in all 3 divisions bilaterally. Corneal responses are intact.  CN VII: Moderate to severe bilateral eye closure, cheek puff, tongue protrusion, soft palate movement weakness CN VIII: Hearing is normal to rubbing fingers CN IX, X: Palate elevates symmetrically. Phonation is normal. CN XI: Head turning and shoulder shrug are intact CN XII: Tongue is midline with normal movements and no atrophy.  MOTOR: There is no pronator drift of out-stretched arms. Muscle bulk and tone are normal   Shoulder abduction Shoulder external rotation Elbow flexion Elbow extension Wrist flexion Wrist extension Finger abduction Hip flexion Knee flexion Knee extension Ankle dorsi  flexion Ankle plantar flexion  R 4- 4 4 4 5 5 5 4 5 5 4 5   L 4- 4 4 4 5 5 5 4 5 5 4 5     REFLEXES: Reflexes are 2+ and symmetric at the biceps, triceps, knees, and ankles. Plantar responses are flexor.  SENSORY: Light touch, pinprick, position sense, and vibration sense are intact in fingers and toes.  COORDINATION: Rapid alternating movements and fine finger movements are intact. There is no dysmetria on finger-to-nose and heel-knee-shin. There are no abnormal or extraneous movements.   GAIT/STANCE: Need to push up from seated position, cautious, mildly unsteady gait    DIAGNOSTIC DATA (LABS, IMAGING, TESTING) - I reviewed patient records, labs, notes, testing and imaging myself where available.  Lab Results  Component Value Date   WBC 12.0* 12/30/2014   HGB 13.1 12/30/2014   HCT 38.8* 12/30/2014   MCV 93.0 12/30/2014   PLT 238 12/30/2014      Component Value Date/Time   NA 143 01/01/2015 0817   K 3.4* 01/01/2015 0817   CL 104 01/01/2015 0817   CO2 30 01/01/2015 0817   GLUCOSE 92 01/01/2015 0817   BUN 15 01/01/2015 0817   CREATININE 0.59 01/01/2015 0817   CALCIUM 8.8 01/01/2015 0817   PROT 5.2* 12/30/2014 0550   ALBUMIN 2.4* 12/30/2014 0550   AST 27 12/30/2014 0550   ALT 49 12/30/2014 0550   ALKPHOS 67 12/30/2014 0550   BILITOT 0.9 12/30/2014 0550   GFRNONAA >90 01/01/2015 0817   GFRAA >90 01/01/2015 0817   No results found for: CHOL, HDL, LDLCALC, LDLDIRECT, TRIG, CHOLHDL Lab Results  Component Value Date   HGBA1C 6.9* 12/28/2014   No results found for: [id number] Lab Results  Component Value Date   TSH 0.393 12/27/2014      ASSESSMENT AND PLAN  Craig [name] is a 74 y.o. male  with generalized myasthenia gravis, presented with exacerbation, breathing difficulty, require intubation, profound dysphagia require PEG tube placement in January 2015, mild to moderate improvement following plasma exchange, last one was in February second 2016, metastatic  bladder cancer, finished chemotherapy in October 2015,   1, myasthenia gravis with exacerbation, 2, home health IVIG 3, keep current dose of Mestinon 90 mg 4 times a day, prednisone 60 mg daily, 4, advise his family keep close observation, if he developed worsening breathing difficulty he should presented to emergency room, 5, MRI of brain w/wo  6, return to clinic in 2 weeks  Marcial Pacas, M.D. Ph.D.  [facility] [address], [place], [place] [address] Ph: [phone] Fax: [phone]

## 2015-01-09 ENCOUNTER — Telehealth: Payer: Self-pay | Admitting: Neurology

## 2015-01-09 LAB — CBC WITH DIFFERENTIAL
BASOS ABS: 0 10*3/uL (ref 0.0–0.2)
BASOS: 0 %
EOS ABS: 0 10*3/uL (ref 0.0–0.4)
Eos: 0 %
HCT: 42.9 % (ref 37.5–51.0)
Hemoglobin: 14.4 g/dL (ref 12.6–17.7)
IMMATURE GRANULOCYTES: 0 %
Immature Grans (Abs): 0 10*3/uL (ref 0.0–0.1)
Lymphocytes Absolute: 0.4 10*3/uL — ABNORMAL LOW (ref 0.7–3.1)
Lymphs: 3 %
MCH: 31.2 pg (ref 26.6–33.0)
MCHC: 33.6 g/dL (ref 31.5–35.7)
MCV: 93 fL (ref 79–97)
MONOS ABS: 0.5 10*3/uL (ref 0.1–0.9)
Monocytes: 4 %
Neutrophils Absolute: 11.8 10*3/uL — ABNORMAL HIGH (ref 1.4–7.0)
Neutrophils Relative %: 93 %
RBC: 4.61 x10E6/uL (ref 4.14–5.80)
RDW: 14.4 % (ref 12.3–15.4)
WBC: 12.8 10*3/uL — ABNORMAL HIGH (ref 3.4–10.8)

## 2015-01-10 ENCOUNTER — Encounter (HOSPITAL_COMMUNITY): Payer: Self-pay | Admitting: Cardiology

## 2015-01-10 ENCOUNTER — Inpatient Hospital Stay (HOSPITAL_COMMUNITY)
Admission: EM | Admit: 2015-01-10 | Discharge: 2015-01-22 | DRG: 056 | Disposition: A | Discharge status: Rehabilitation Facility | Payer: PPO | Attending: Internal Medicine | Admitting: Internal Medicine

## 2015-01-10 ENCOUNTER — Emergency Department (HOSPITAL_COMMUNITY): Payer: PPO

## 2015-01-10 ENCOUNTER — Other Ambulatory Visit (HOSPITAL_COMMUNITY): Payer: Self-pay

## 2015-01-10 DIAGNOSIS — Z7901 Long term (current) use of anticoagulants: Secondary | ICD-10-CM

## 2015-01-10 DIAGNOSIS — E876 Hypokalemia: Secondary | ICD-10-CM | POA: Diagnosis not present

## 2015-01-10 DIAGNOSIS — G7000 Myasthenia gravis without (acute) exacerbation: Secondary | ICD-10-CM | POA: Diagnosis present

## 2015-01-10 DIAGNOSIS — J9600 Acute respiratory failure, unspecified whether with hypoxia or hypercapnia: Secondary | ICD-10-CM | POA: Diagnosis not present

## 2015-01-10 DIAGNOSIS — C679 Malignant neoplasm of bladder, unspecified: Secondary | ICD-10-CM | POA: Diagnosis not present

## 2015-01-10 DIAGNOSIS — G7001 Myasthenia gravis with (acute) exacerbation: Secondary | ICD-10-CM | POA: Diagnosis present

## 2015-01-10 DIAGNOSIS — R55 Syncope and collapse: Secondary | ICD-10-CM | POA: Diagnosis present

## 2015-01-10 DIAGNOSIS — E860 Dehydration: Secondary | ICD-10-CM | POA: Diagnosis present

## 2015-01-10 DIAGNOSIS — C772 Secondary and unspecified malignant neoplasm of intra-abdominal lymph nodes: Secondary | ICD-10-CM | POA: Diagnosis not present

## 2015-01-10 DIAGNOSIS — Z886 Allergy status to analgesic agent status: Secondary | ICD-10-CM

## 2015-01-10 DIAGNOSIS — K219 Gastro-esophageal reflux disease without esophagitis: Secondary | ICD-10-CM | POA: Diagnosis not present

## 2015-01-10 DIAGNOSIS — A0472 Enterocolitis due to Clostridium difficile, not specified as recurrent: Secondary | ICD-10-CM | POA: Diagnosis present

## 2015-01-10 DIAGNOSIS — A047 Enterocolitis due to Clostridium difficile: Secondary | ICD-10-CM | POA: Diagnosis not present

## 2015-01-10 DIAGNOSIS — L89892 Pressure ulcer of other site, stage 2: Secondary | ICD-10-CM | POA: Diagnosis not present

## 2015-01-10 DIAGNOSIS — Z7951 Long term (current) use of inhaled steroids: Secondary | ICD-10-CM

## 2015-01-10 DIAGNOSIS — IMO0002 Reserved for concepts with insufficient information to code with codable children: Secondary | ICD-10-CM | POA: Diagnosis present

## 2015-01-10 DIAGNOSIS — Z800 Family history of malignant neoplasm of digestive organs: Secondary | ICD-10-CM

## 2015-01-10 DIAGNOSIS — I10 Essential (primary) hypertension: Secondary | ICD-10-CM | POA: Diagnosis not present

## 2015-01-10 DIAGNOSIS — C7911 Secondary malignant neoplasm of bladder: Secondary | ICD-10-CM | POA: Diagnosis present

## 2015-01-10 DIAGNOSIS — K5900 Constipation, unspecified: Secondary | ICD-10-CM | POA: Diagnosis not present

## 2015-01-10 DIAGNOSIS — E870 Hyperosmolality and hypernatremia: Secondary | ICD-10-CM | POA: Diagnosis present

## 2015-01-10 DIAGNOSIS — Z931 Gastrostomy status: Secondary | ICD-10-CM | POA: Diagnosis not present

## 2015-01-10 DIAGNOSIS — E785 Hyperlipidemia, unspecified: Secondary | ICD-10-CM | POA: Diagnosis present

## 2015-01-10 DIAGNOSIS — Z79899 Other long term (current) drug therapy: Secondary | ICD-10-CM

## 2015-01-10 DIAGNOSIS — J9811 Atelectasis: Secondary | ICD-10-CM | POA: Diagnosis not present

## 2015-01-10 DIAGNOSIS — R5381 Other malaise: Secondary | ICD-10-CM | POA: Insufficient documentation

## 2015-01-10 DIAGNOSIS — E1165 Type 2 diabetes mellitus with hyperglycemia: Secondary | ICD-10-CM | POA: Diagnosis present

## 2015-01-10 DIAGNOSIS — O10019 Pre-existing essential hypertension complicating pregnancy, unspecified trimester: Secondary | ICD-10-CM | POA: Diagnosis present

## 2015-01-10 DIAGNOSIS — C7989 Secondary malignant neoplasm of other specified sites: Secondary | ICD-10-CM | POA: Diagnosis not present

## 2015-01-10 DIAGNOSIS — I482 Chronic atrial fibrillation: Secondary | ICD-10-CM | POA: Diagnosis not present

## 2015-01-10 DIAGNOSIS — G4733 Obstructive sleep apnea (adult) (pediatric): Secondary | ICD-10-CM | POA: Diagnosis present

## 2015-01-10 DIAGNOSIS — R069 Unspecified abnormalities of breathing: Secondary | ICD-10-CM

## 2015-01-10 LAB — BASIC METABOLIC PANEL
ANION GAP: 12 (ref 5–15)
BUN: 46 mg/dL — ABNORMAL HIGH (ref 6–23)
CO2: 30 mmol/L (ref 19–32)
Calcium: 9.6 mg/dL (ref 8.4–10.5)
Chloride: 109 mmol/L (ref 96–112)
Creatinine, Ser: 1.24 mg/dL (ref 0.50–1.35)
GFR calc Af Amer: 64 mL/min — ABNORMAL LOW (ref >90)
GFR calc non Af Amer: 56 mL/min — ABNORMAL LOW (ref >90)
Glucose, Bld: 344 mg/dL — ABNORMAL HIGH (ref 70–99)
Potassium: 4.9 mmol/L (ref 3.5–5.1)
Sodium: 151 mmol/L — ABNORMAL HIGH (ref 135–145)

## 2015-01-10 LAB — URINALYSIS, ROUTINE W REFLEX MICROSCOPIC
BILIRUBIN URINE: NEGATIVE
Glucose, UA: NEGATIVE mg/dL
Hgb urine dipstick: NEGATIVE
Ketones, ur: NEGATIVE mg/dL
Leukocytes, UA: NEGATIVE
NITRITE: NEGATIVE
PH: 6.5 (ref 5.0–8.0)
PROTEIN: NEGATIVE mg/dL
Specific Gravity, Urine: 1.022 (ref 1.005–1.030)
UROBILINOGEN UA: 1 mg/dL (ref 0.0–1.0)

## 2015-01-10 LAB — I-STAT TROPONIN, ED: Troponin i, poc: 0.06 ng/mL (ref 0.00–0.08)

## 2015-01-10 LAB — CBC
HCT: 44.5 % (ref 39.0–52.0)
Hemoglobin: 14.5 g/dL (ref 13.0–17.0)
MCH: 31 pg (ref 26.0–34.0)
MCHC: 32.6 g/dL (ref 30.0–36.0)
MCV: 95.3 fL (ref 78.0–100.0)
PLATELETS: 262 10*3/uL (ref 150–400)
RBC: 4.67 MIL/uL (ref 4.22–5.81)
RDW: 15.2 % (ref 11.5–15.5)
WBC: 17.7 10*3/uL — ABNORMAL HIGH (ref 4.0–10.5)

## 2015-01-10 LAB — BRAIN NATRIURETIC PEPTIDE: B NATRIURETIC PEPTIDE 5: 76.3 pg/mL (ref 0.0–100.0)

## 2015-01-10 MED ORDER — PYRIDOSTIGMINE BROMIDE 60 MG PO TABS
60.0000 mg | ORAL_TABLET | Freq: Four times a day (QID) | ORAL | Status: DC
  Administered 2015-01-10 – 2015-01-22 (×46): 60 mg via ORAL
  Filled 2015-01-10 (×51): qty 1

## 2015-01-10 MED ORDER — SODIUM CHLORIDE 0.9 % IV BOLUS (SEPSIS)
1000.0000 mL | Freq: Once | INTRAVENOUS | Status: AC
  Administered 2015-01-10: 500 mL via INTRAVENOUS

## 2015-01-10 MED ORDER — SODIUM CHLORIDE 0.9 % IV BOLUS (SEPSIS): 1000.0000 mL | Freq: Once | INTRAVENOUS | Status: DC

## 2015-01-10 MED ORDER — PREDNISONE 50 MG PO TABS
60.0000 mg | ORAL_TABLET | Freq: Every day | ORAL | Status: DC
  Administered 2015-01-11 – 2015-01-17 (×7): 60 mg via ORAL
  Filled 2015-01-10 (×8): qty 1

## 2015-01-10 MED ORDER — IMMUNE GLOBULIN (HUMAN) 10 GM/200ML IV SOLN
400.0000 mg/kg | INTRAVENOUS | Status: DC
  Administered 2015-01-11: 40 g via INTRAVENOUS
  Filled 2015-01-10: qty 800

## 2015-01-10 MED ORDER — SODIUM CHLORIDE 0.9 % IV BOLUS (SEPSIS): 500.0000 mL | Freq: Once | INTRAVENOUS | Status: DC

## 2015-01-10 NOTE — ED Notes (Signed)
Respiratory made aware of EDPs order of vital capacity.

## 2015-01-10 NOTE — ED Notes (Signed)
Respiratory

## 2015-01-10 NOTE — ED Notes (Signed)
Pt reports he has Myasthenia Gravis and had  Been SOB for the past couple of days. Family states he is being seen by a neurologist, and was told to come here is anything changed. Family states his O2 sats have been low. Pt having trouble talking.

## 2015-01-10 NOTE — ED Notes (Signed)
Spoke with EDP regarding pt's respiratory status. Will give 500cc bolus vs liter bolus.  BP currently 97/77.  Rhonchi auscultated.

## 2015-01-10 NOTE — ED Notes (Signed)
VC performed by patient with good effort on all 3 attempts. VC 443ml,700ml, and 657ml.  RT made RN and MD aware of results.

## 2015-01-10 NOTE — ED Notes (Signed)
Respiratory at bedside doing NIF treatment.

## 2015-01-10 NOTE — ED Provider Notes (Signed)
CSN: [id number]     Arrival date & time 01/10/15  1607 History   First MD Initiated Contact with Patient 01/10/15 1717     Chief Complaint  Patient presents with  . Shortness of Breath     (Consider location/radiation/quality/duration/timing/severity/associated sxs/prior Treatment) The history is provided by the patient.    74 year old male with past medical history of hypertension, CHF, AFib, metastatic bladder cancer as well as myasthenia gravis, with recent admission requiring intubation who presents with a several day history of worsening fatigue and shortness of breath. Patient states that over the last several days he has had progressively worsening generalized fatigue as well as shortness of breath. He has chronic venous arthritic, but notes increasing slurred speech and difficulty speaking over the last 24 hours as well. He has discussed this with his neurologist at home who advised him to report the ED for further evaluation. He has a history of recent admission for similar symptoms at which time he was intubated and states that while his symptoms are not nearly as severe as they were at that time. He is concerned that they're worsening. He denies any recent significant cough or sputum production. Denies any fevers or chills. No abdominal pain, nausea, vomiting or diarrhea. He describes his symptoms as generalized sensation that he cannot "get enough air" that is made worse with any exertion. Denies any alleviating factors. Of note, he is prescribed IVIG, but has been unable to obtain this due to insurance issues.  Past Medical History  Diagnosis Date  . Atrial fibrillation, chronic     on Xarelto   . HTN (hypertension)   . CHF (congestive heart failure)   . Diabetes mellitus   . 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 Myasthenia gravis    Past Surgical History  Procedure Laterality Date  . Abdominal wall metastasis removal      Family History  Problem Relation Age of Onset  . Colon cancer Mother   . Heart attack Father   . Diabetes Father    History  Substance Use Topics  . Smoking status: Never Smoker   . Smokeless tobacco: Not on file  . Alcohol Use: No    Review of Systems  Constitutional: Positive for fatigue. Negative for fever and chills.  HENT: Positive for voice change. Negative for congestion, rhinorrhea and sore throat.   Eyes: Negative for visual disturbance.  Respiratory: Positive for shortness of breath. Negative for cough and wheezing.   Cardiovascular: Negative for chest pain.  Gastrointestinal: Negative for nausea, vomiting, abdominal pain and diarrhea.  Musculoskeletal: Negative for neck pain.  Skin: Negative for rash.  Allergic/Immunologic: Negative for immunocompromised state.  Neurological: Negative for dizziness, weakness and headaches.      Allergies  Ibuprofen  Home Medications   Prior to Admission medications   Medication Sig Start Date End Date Taking? Authorizing Provider  amiodarone (PACERONE) 100 MG tablet Place 1 tablet (100 mg total) into feeding tube daily. 12/29/14  Yes Barton Dubois, MD  antiseptic oral rinse (CPC / CETYLPYRIDINIUM CHLORIDE 0.05%) 0.05 % LIQD solution 7 mLs by Mouth Rinse route 2 (two) times daily. 01/02/15  Yes Ivan Anchors Love, PA-C  artificial tears (LACRILUBE) OINT ophthalmic ointment Place into both eyes every 4 (four) hours as needed for dry eyes. 12/29/14  Yes Barton Dubois, MD  atorvastatin (LIPITOR) 40 MG tablet Place 1 tablet (40 mg total) into feeding tube daily. 12/29/14  Yes Clifton James  Dyann Kief, MD  cephALEXin (KEFLEX) 250 MG/5ML suspension Place 5 mLs (250 mg total) into feeding tube every 8 (eight) hours. 01/02/15  Yes Ivan Anchors Love, PA-C  famotidine (PEPCID) 40 MG/5ML suspension Take 5 mLs (40 mg total) by mouth daily. 01/02/15  Yes Ivan Anchors Love, PA-C  insulin aspart (NOVOLOG FLEXPEN) 100 UNIT/ML FlexPen Inject 0-10 Units into the skin 4 (four)  times daily. Use according to scale prior to tube feeds. 01/02/15  Yes Ivan Anchors Love, PA-C  ipratropium (ATROVENT HFA) 17 MCG/ACT inhaler Inhale 1 puff into the lungs every 6 (six) hours as needed for wheezing.   Yes Historical Provider, MD  ipratropium (ATROVENT) 0.02 % nebulizer solution Inhale 2.5 mLs (0.5 mg total) into the lungs every 6 (six) hours as needed (wHEEZING.). 12/29/14  Yes Barton Dubois, MD  metoprolol tartrate (LOPRESSOR) 25 MG tablet Place 0.5 tablets (12.5 mg total) into feeding tube daily. 12/29/14  Yes Barton Dubois, MD  nateglinide (STARLIX) 120 MG tablet Place 1 tablet (120 mg total) into feeding tube 3 (three) times daily. 01/02/15  Yes Ivan Anchors Love, PA-C  Nutritional Supplements (FEEDING SUPPLEMENT, JEVITY 1.2 CAL,) LIQD Place 390 mLs into feeding tube 4 (four) times daily -  with meals and at bedtime. 01/02/15  Yes Ivan Anchors Love, PA-C  potassium chloride 20 MEQ/15ML (10%) SOLN Place 15 mLs (20 mEq total) into feeding tube 3 (three) times daily. 01/02/15  Yes Ivan Anchors Love, PA-C  predniSONE (DELTASONE) 20 MG tablet Place 3 tablets (60 mg total) into feeding tube daily with breakfast. 01/02/15  Yes Ivan Anchors Love, PA-C  pyridostigmine (MESTINON) 60 MG tablet 1.5 tablets (90 mg total) by Per NG tube route 4 (four) times daily. 01/02/15  Yes Ivan Anchors Love, PA-C  rivaroxaban (XARELTO) 20 MG TABS tablet Take 1 tablet (20 mg total) by mouth daily with supper. Patient taking differently: Place 20 mg into feeding tube daily with supper.  12/29/14  Yes Barton Dubois, MD  chlorhexidine (PERIDEX) 0.12 % solution 15 mLs by Mouth Rinse route 2 (two) times daily. 12/29/14   Barton Dubois, MD  ondansetron (ZOFRAN) 4 MG tablet Take 1 tablet (4 mg total) by mouth every 6 (six) hours as needed for nausea. 01/02/15   Bary Leriche, PA-C  senna (SENOKOT) 8.6 MG TABS tablet Place 1 tablet (8.6 mg total) into feeding tube daily as needed for mild constipation. Patient not taking: Reported on 01/10/2015 01/02/15    Bary Leriche, PA-C  Water For Irrigation, Sterile (FREE WATER) SOLN Place 150 mLs into feeding tube 4 (four) times daily - after meals and at bedtime. 01/02/15   Ivan Anchors Love, PA-C   BP 113/68 mmHg  Pulse 96  Temp(Src) 97.6 F (36.4 C) (Oral)  Wt 220 lb (99.791 kg)  SpO2 94% Physical Exam  Constitutional: He is oriented to person, place, and time. He appears well-developed. No distress.  Chronically ill-appearing  HENT:  Head: Normocephalic and atraumatic.  Oropharynx dry with tacky mucous membranes. No pooling of secretions.  Eyes: Conjunctivae are normal. Pupils are equal, round, and reactive to light.  No significant ptosis noted at rest  Neck: Normal range of motion. Neck supple.  Cardiovascular: Normal rate, normal heart sounds and intact distal pulses.  Exam reveals no friction rub.   No murmur heard. Pulmonary/Chest: Tachypnea noted. No respiratory distress. He has no wheezes. He has no rales.  Abdominal: Soft. Bowel sounds are normal. He exhibits no distension. There is no tenderness.  Musculoskeletal: He  exhibits no edema.  Neurological: He is alert and oriented to person, place, and time. No cranial nerve deficit. He exhibits normal muscle tone.  Skin: Skin is warm. No rash noted.  Nursing note and vitals reviewed.   ED Course  Procedures (including critical care time) Labs Review Labs Reviewed  CBC - Abnormal; Notable for the following:    WBC 17.7 (*)    All other components within normal limits  BASIC METABOLIC PANEL - Abnormal; Notable for the following:    Sodium 151 (*)    Glucose, Bld 344 (*)    BUN 46 (*)    GFR calc non Af Amer 56 (*)    GFR calc Af Amer 64 (*)    All other components within normal limits  GLUCOSE, CAPILLARY - Abnormal; Notable for the following:    Glucose-Capillary 156 (*)    All other components within normal limits  BRAIN NATRIURETIC PEPTIDE  URINALYSIS, ROUTINE W REFLEX MICROSCOPIC  I-STAT TROPOININ, ED    Imaging  Review Dg Chest 2 View  01/10/2015   CLINICAL DATA:  Chronic shortness of breath for 2 years. Cough last 24 hr.  EXAM: CHEST  2 VIEW  COMPARISON:  12/28/2014  FINDINGS: Left Port-A-Cath remains in place, unchanged. Right central line has been removed. There is cardiomegaly. No confluent airspace opacities or effusions. Mild elevation of the right hemidiaphragm, stable.  IMPRESSION: Cardiomegaly.  No active disease.   Electronically Signed   By: Rolm Baptise M.D.   On: 01/10/2015 18:01     EKG Interpretation None      MDM   Final diagnoses:  Myasthenia gravis    74 year old male with past medical history of hypertension, CHF, metastatic bladder cancer and myasthenia gravis who presents with a several day history of generalized fatigue and subjective shortness of breath. On arrival, the patient is afebrile, heart rate 96, blood pressure 113/68, satting 94% on 2 L nasal cannula, which is the patient's baseline. Exam as above, remarkable for a fatigued appearing, but overall nontoxic male. He is tolerating secretions. He has mild tachypnea but symmetric breath sounds. No significant ptosis is noted. Neck strength is within normal limits.  The patient's presentation is most concerning for acute myasthenic crisis. At this time, he is protecting his airway but will consult neurology for evaluation. Will also obtain stat NIF and FVC. Etiology for myasthenic crisis, is unclear. Of note, the patient has not been able to obtain his IVIG. Will send broad infectious workup as well as check underlying electrolyte abnormalities.  CBC remarkable for leukocytosis of 17.7. 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 BMP is suggestive of intravascular dehydration, given sodium 151, BUN 46 and creatinine 1.24 which is doubled from his baseline. Will give 500 mL bolus and reassess given his known history of CHF. BNP is normal. Chest x-ray shows stable  cardiomegaly with no active disease. EKG shows no acute changes. NIF -30, -22, and -20. VC 450, 700, and 610. Neurology has evaluated. At this time they do not recommend intubation and will admit to the ICU for IVIG and close monitoring.  Clinical Impression: 1. Myasthenia gravis     Disposition: Admit  Condition: Critical but stable  Pt seen in conjunction with Dr. Zandra Abts, MD 01/11/15 [place]. Alvino Chapel, MD 01/12/15 [id number]  Davonna Belling, MD 01/25/15 [phone]

## 2015-01-10 NOTE — ED Notes (Signed)
Patient performed NIF 3 times with good effort. NIF -30,-22, and -20

## 2015-01-10 NOTE — ED Notes (Addendum)
Per provider, neurology would like to hold off on intubation.  Will admit pt to ICU.  Will continue to monitor pt's respiratory status.

## 2015-01-10 NOTE — Consult Note (Addendum)
NEURO HOSPITALIST CONSULT NOTE    Reason for Consult: MG increasing SOB, probable exacerbation  HPI:                                                                                                                                          Craig [name] is an 74 y.o. male well known to the neurology service. with a past medical history significant for HTN, DM, chronic congestive heart failure, atrial fibrillation on  Xarelto, metastatic bladder cancer, and MG, comes in accompanied by his wife complaining of increasing SOB, no feeling well. Patient was hospitalized at [facility] with MG crisis few weeks ago, and at that time was treated with PLEX. He was discharged from rehab last week and today developed worsening sob, fatigue, and low O2 levels at home. Stated that he tires easily talking, doesn't feel well. No double vision, HA, or visual disturbances. He was seen by outpatient neurologist yesterday and was supposed to be started on home IVIG. Denies recent fever, infection, or use of new medications. VC performed by patient with good effort on all 3 attempts. VC 460m,700ml, and 6149m NIF -30,-22, and -20  Serologies are unimpressive. Chronic MG treatment consists of prednisone 60 mg daily and mestinon  90 mg 4 times a day.    Past Medical History  Diagnosis Date  . Atrial fibrillation, chronic     on Xarelto   . HTN (hypertension)   . CHF (congestive heart failure)   . Diabetes mellitus   . 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 Myasthenia gravis     Past Surgical History  Procedure Laterality Date  . Abdominal wall metastasis removal      Family History  Problem Relation Age of Onset  . Colon cancer Mother   . Heart attack Father   . Diabetes Father     Family History: no epilepsy, brain tumors, or brain aneurysms.  Social History:  reports that he has never smoked. He does not have any smokeless tobacco  history on file. He reports that he does not drink alcohol or use illicit drugs.  Allergies  Allergen Reactions  . Ibuprofen Other (See Comments)    Interacts with another medication patient is taking    MEDICATIONS:  I have reviewed the patient's current medications.   ROS:                                                                                                                                       History obtained from the patient, wife, and chart review  General ROS: negative for - chills, fatigue, fever, night sweats,  or weight loss Psychological ROS: negative for - behavioral disorder, hallucinations, memory difficulties, mood swings or suicidal ideation Ophthalmic ROS: negative for - blurry vision, double vision, eye pain or loss of vision ENT ROS: negative for - epistaxis, nasal discharge, oral lesions, sore throat, tinnitus or vertigo Allergy and Immunology ROS: negative for - hives or itchy/watery eyes Hematological and Lymphatic ROS: negative for - bleeding problems, bruising or swollen lymph nodes Endocrine ROS: negative for - galactorrhea, hair pattern changes, polydipsia/polyuria or temperature intolerance Respiratory ROS: negative for - cough, hemoptysis, shortness of breath or wheezing Cardiovascular ROS: negative for - chest pain, dyspnea on exertion, edema or irregular heartbeat Gastrointestinal ROS: negative for - abdominal pain, diarrhea, hematemesis, nausea/vomiting or stool incontinence Genito-Urinary ROS: negative for - dysuria, hematuria, incontinence or urinary frequency/urgency Musculoskeletal ROS: negative for - joint swelling Neurological ROS: as noted in HPI Dermatological ROS: negative for rash and skin lesion changes  Physical exam: pleasant male in mild respiratory distress. Blood pressure 113/69, pulse 89, temperature 98.1 F (36.7  C), temperature source Oral, resp. rate 22, weight 99.791 kg (220 lb), SpO2 93 %. Head: normocephalic. Neck: supple, no bruits, no JVD. Cardiac: no murmurs. Lungs: clear. Abdomen: soft, no tender, no mass. Extremities: no edema. Skin: no rash Neurologic Examination:                                                                                                      General: Mental Status: Alert, oriented, thought content appropriate.  Speech fluent without evidence of aphasia.  Able to follow 3 step commands without difficulty. Cranial Nerves: II: Discs flat bilaterally; Visual fields grossly normal, pupils equal, round, reactive to light and accommodation III,IV, VI: ptosis not present, extra-ocular motions intact bilaterally V,VII: smile symmetric, facial light touch sensation normal bilaterally VIII: hearing normal bilaterally IX,X: gag reflex present XI: bilateral shoulder shrug XII: midline tongue extension without atrophy or fasciculations Motor: Mild neck flexors and biceps weakness. Tone and bulk:normal tone throughout; no atrophy noted Sensory: Pinprick and light touch intact throughout, bilaterally Deep Tendon Reflexes:  1+ all over Plantars: Right: downgoing  Left: downgoing Cerebellar: normal finger-to-nose,  heel-to-shin no tested Gait:  No tested for safety reasons CV: pulses palpable throughout    No results found for: CHOL  Results for orders placed or performed during the hospital encounter of 01/10/15 (from the past 48 hour(s))  CBC     Status: Abnormal   Collection Time: 01/10/15  4:30 PM  Result Value Ref Range   WBC 17.7 (H) 4.0 - 10.5 K/uL   RBC 4.67 4.22 - 5.81 MIL/uL   Hemoglobin 14.5 13.0 - 17.0 g/dL   HCT 44.5 39.0 - 52.0 %   MCV 95.3 78.0 - 100.0 fL   MCH 31.0 26.0 - 34.0 pg   MCHC 32.6 30.0 - 36.0 g/dL   RDW 15.2 11.5 - 15.5 %   Platelets 262 150 - 400 K/uL  Basic metabolic panel     Status: Abnormal   Collection Time: 01/10/15  4:30 PM   Result Value Ref Range   Sodium 151 (H) 135 - 145 mmol/L   Potassium 4.9 3.5 - 5.1 mmol/L   Chloride 109 96 - 112 mmol/L   CO2 30 19 - 32 mmol/L   Glucose, Bld 344 (H) 70 - 99 mg/dL   BUN 46 (H) 6 - 23 mg/dL   Creatinine, Ser 1.24 0.50 - 1.35 mg/dL   Calcium 9.6 8.4 - 10.5 mg/dL   GFR calc non Af Amer 56 (L) >90 mL/min   GFR calc Af Amer 64 (L) >90 mL/min    Comment: (NOTE) The eGFR has been calculated using the CKD EPI equation. This calculation has not been validated in all clinical situations. eGFR's persistently <90 mL/min signify possible Chronic Kidney Disease.    Anion gap 12 5 - 15  BNP (order ONLY if patient complains of dyspnea/SOB AND you have documented it for THIS visit)     Status: None   Collection Time: 01/10/15  4:30 PM  Result Value Ref Range   B Natriuretic Peptide 76.3 0.0 - 100.0 pg/mL  I-stat troponin, ED (not at [facility])     Status: None   Collection Time: 01/10/15  4:47 PM  Result Value Ref Range   Troponin i, poc 0.06 0.00 - 0.08 ng/mL   Comment 3            Comment: Due to the release kinetics of cTnI, a negative result within the first hours of the onset of symptoms does not rule out myocardial infarction with certainty. If myocardial infarction is still suspected, repeat the test at appropriate intervals.   Urinalysis, Routine w reflex microscopic     Status: None   Collection Time: 01/10/15  6:49 PM  Result Value Ref Range   Color, Urine YELLOW YELLOW   APPearance CLEAR CLEAR   Specific Gravity, Urine 1.022 1.005 - 1.030   pH 6.5 5.0 - 8.0   Glucose, UA NEGATIVE NEGATIVE mg/dL   Hgb urine dipstick NEGATIVE NEGATIVE   Bilirubin Urine NEGATIVE NEGATIVE   Ketones, ur NEGATIVE NEGATIVE mg/dL   Protein, ur NEGATIVE NEGATIVE mg/dL   Urobilinogen, UA 1.0 0.0 - 1.0 mg/dL   Nitrite NEGATIVE NEGATIVE   Leukocytes, UA NEGATIVE NEGATIVE    Comment: MICROSCOPIC NOT DONE ON URINES WITH NEGATIVE PROTEIN, BLOOD, LEUKOCYTES, NITRITE, OR GLUCOSE <1000 mg/dL.     Dg Chest 2 View  01/10/2015   CLINICAL DATA:  Chronic shortness of breath for 2 years. Cough last 24 hr.  EXAM: CHEST  2 VIEW  COMPARISON:  12/28/2014  FINDINGS: Left Port-A-Cath  remains in place, unchanged. Right central line has been removed. There is cardiomegaly. No confluent airspace opacities or effusions. Mild elevation of the right hemidiaphragm, stable.  IMPRESSION: Cardiomegaly.  No active disease.   Electronically Signed   By: Rolm Baptise M.D.   On: 01/10/2015 18:01   Assessment/Plan: 74 y/o with known MG, recent admission with MG exacerbation treated with PLEX, comes back with increasing SOB, voice changes, and declining respiratory capacity. No obvious respiratory distress at this moment but NIF -20 and very likely he will require intubation sooner than late. In the meantime, will start IVIG. Admit to ICU. Continue mestinon and prednisone. Will follow up.  Dorian Pod, MD 01/10/2015, 8:53 PM  [facility]

## 2015-01-11 ENCOUNTER — Inpatient Hospital Stay (HOSPITAL_COMMUNITY): Payer: PPO

## 2015-01-11 DIAGNOSIS — G7000 Myasthenia gravis without (acute) exacerbation: Secondary | ICD-10-CM

## 2015-01-11 DIAGNOSIS — J9620 Acute and chronic respiratory failure, unspecified whether with hypoxia or hypercapnia: Secondary | ICD-10-CM

## 2015-01-11 LAB — MRSA PCR SCREENING: MRSA BY PCR: NEGATIVE

## 2015-01-11 LAB — GLUCOSE, CAPILLARY
Glucose-Capillary: 128 mg/dL — ABNORMAL HIGH (ref 70–99)
Glucose-Capillary: 156 mg/dL — ABNORMAL HIGH (ref 70–99)
Glucose-Capillary: 280 mg/dL — ABNORMAL HIGH (ref 70–99)

## 2015-01-11 MED ORDER — FAMOTIDINE 40 MG/5ML PO SUSR
40.0000 mg | Freq: Every day | ORAL | Status: DC
  Administered 2015-01-11 – 2015-01-17 (×7): 40 mg via ORAL
  Filled 2015-01-11 (×8): qty 5

## 2015-01-11 MED ORDER — CETYLPYRIDINIUM CHLORIDE 0.05 % MT LIQD
7.0000 mL | Freq: Two times a day (BID) | OROMUCOSAL | Status: DC
  Administered 2015-01-11 – 2015-01-22 (×23): 7 mL via OROMUCOSAL

## 2015-01-11 MED ORDER — HEPARIN SODIUM (PORCINE) 1000 UNIT/ML IJ SOLN
3000.0000 [IU] | Freq: Once | INTRAMUSCULAR | Status: AC
  Administered 2015-01-11: 2000 [IU] via INTRAVENOUS

## 2015-01-11 MED ORDER — INSULIN ASPART 100 UNIT/ML ~~LOC~~ SOLN
0.0000 [IU] | SUBCUTANEOUS | Status: DC
  Administered 2015-01-11: 2 [IU] via SUBCUTANEOUS
  Administered 2015-01-11 – 2015-01-12 (×2): 8 [IU] via SUBCUTANEOUS
  Administered 2015-01-12: 11 [IU] via SUBCUTANEOUS
  Administered 2015-01-12: 5 [IU] via SUBCUTANEOUS
  Administered 2015-01-12: 2 [IU] via SUBCUTANEOUS
  Administered 2015-01-12 – 2015-01-13 (×2): 5 [IU] via SUBCUTANEOUS
  Administered 2015-01-13 (×2): 11 [IU] via SUBCUTANEOUS
  Administered 2015-01-13 (×2): 8 [IU] via SUBCUTANEOUS
  Administered 2015-01-13: 11 [IU] via SUBCUTANEOUS
  Administered 2015-01-14: 8 [IU] via SUBCUTANEOUS
  Administered 2015-01-14: 11 [IU] via SUBCUTANEOUS
  Administered 2015-01-14: 8 [IU] via SUBCUTANEOUS
  Administered 2015-01-14: 5 [IU] via SUBCUTANEOUS
  Administered 2015-01-14: 8 [IU] via SUBCUTANEOUS
  Administered 2015-01-14: 5 [IU] via SUBCUTANEOUS
  Administered 2015-01-15: 3 [IU] via SUBCUTANEOUS
  Administered 2015-01-15: 8 [IU] via SUBCUTANEOUS
  Administered 2015-01-15 (×3): 5 [IU] via SUBCUTANEOUS
  Administered 2015-01-15 – 2015-01-16 (×2): 8 [IU] via SUBCUTANEOUS
  Administered 2015-01-16: 3 [IU] via SUBCUTANEOUS
  Administered 2015-01-16: 2 [IU] via SUBCUTANEOUS
  Administered 2015-01-16: 8 [IU] via SUBCUTANEOUS
  Administered 2015-01-16: 2 [IU] via SUBCUTANEOUS
  Administered 2015-01-16: 5 [IU] via SUBCUTANEOUS
  Administered 2015-01-17 (×2): 2 [IU] via SUBCUTANEOUS
  Administered 2015-01-17: 5 [IU] via SUBCUTANEOUS
  Administered 2015-01-17: 8 [IU] via SUBCUTANEOUS
  Administered 2015-01-17: 5 [IU] via SUBCUTANEOUS
  Administered 2015-01-18: 2 [IU] via SUBCUTANEOUS
  Administered 2015-01-18: 3 [IU] via SUBCUTANEOUS
  Administered 2015-01-18: 2 [IU] via SUBCUTANEOUS
  Administered 2015-01-18 (×2): 3 [IU] via SUBCUTANEOUS
  Administered 2015-01-18: 2 [IU] via SUBCUTANEOUS
  Administered 2015-01-19: 5 [IU] via SUBCUTANEOUS
  Administered 2015-01-19: 3 [IU] via SUBCUTANEOUS
  Administered 2015-01-19: 5 [IU] via SUBCUTANEOUS
  Administered 2015-01-19 (×2): 3 [IU] via SUBCUTANEOUS
  Administered 2015-01-19: 2 [IU] via SUBCUTANEOUS
  Administered 2015-01-20: 5 [IU] via SUBCUTANEOUS
  Administered 2015-01-20: 2 [IU] via SUBCUTANEOUS
  Administered 2015-01-20: 5 [IU] via SUBCUTANEOUS
  Administered 2015-01-20: 3 [IU] via SUBCUTANEOUS
  Administered 2015-01-21: 8 [IU] via SUBCUTANEOUS
  Administered 2015-01-21: 5 [IU] via SUBCUTANEOUS
  Administered 2015-01-21: 2 [IU] via SUBCUTANEOUS
  Administered 2015-01-21: 8 [IU] via SUBCUTANEOUS
  Administered 2015-01-22 (×2): 3 [IU] via SUBCUTANEOUS

## 2015-01-11 MED ORDER — METOPROLOL TARTRATE 12.5 MG HALF TABLET: 12.5000 mg | ORAL_TABLET | Freq: Every day | ORAL | Status: DC

## 2015-01-11 MED ORDER — SODIUM CHLORIDE 0.45 % IV SOLN
INTRAVENOUS | Status: DC
  Administered 2015-01-11 – 2015-01-12 (×2): via INTRAVENOUS

## 2015-01-11 MED ORDER — METOPROLOL TARTRATE 1 MG/ML IV SOLN
2.5000 mg | INTRAVENOUS | Status: DC | PRN
  Filled 2015-01-11: qty 5

## 2015-01-11 MED ORDER — VITAL HIGH PROTEIN PO LIQD
1000.0000 mL | ORAL | Status: DC
  Administered 2015-01-11: 1000 mL
  Administered 2015-01-12: 09:00:00
  Filled 2015-01-11 (×3): qty 1000

## 2015-01-11 MED ORDER — SODIUM CHLORIDE 0.9 % IJ SOLN
10.0000 mL | INTRAMUSCULAR | Status: DC | PRN
  Administered 2015-01-11: 20 mL
  Filled 2015-01-11: qty 40

## 2015-01-11 MED ORDER — CHLORHEXIDINE GLUCONATE 0.12 % MT SOLN
15.0000 mL | Freq: Two times a day (BID) | OROMUCOSAL | Status: DC
  Administered 2015-01-11 – 2015-01-22 (×23): 15 mL via OROMUCOSAL
  Filled 2015-01-11 (×24): qty 15

## 2015-01-11 MED ORDER — FREE WATER
100.0000 mL | Freq: Three times a day (TID) | Status: DC
  Administered 2015-01-11 – 2015-01-12 (×3): 100 mL

## 2015-01-11 MED ORDER — AMIODARONE HCL 200 MG PO TABS
100.0000 mg | ORAL_TABLET | Freq: Every day | ORAL | Status: DC
Start: 2015-01-11 — End: 2015-01-22
  Administered 2015-01-11 – 2015-01-22 (×12): 100 mg
  Filled 2015-01-11 (×13): qty 1

## 2015-01-11 NOTE — Procedures (Signed)
Hemodialysis Catheter Insertion Procedure Note Craig [name] [id number] 1941/01/30  Procedure: Insertion of Hemodialysis Catheter Indications: PLEX Treatement  Procedure Details Consent: Risks of procedure as well as the alternatives and risks of each were explained to the (patient/caregiver).  Consent for procedure obtained.   Time Out: Verified patient identification, verified procedure, site/side was marked, verified correct patient position, special equipment/implants available, medications/allergies/relevent history reviewed, required imaging and test results available.  Performed  Maximum sterile technique was used including antiseptics, cap, gloves, gown, hand hygiene, mask and sheet. Skin prep: Chlorhexidine; local anesthetic administered A triple lumen HD catheter was placed in the right internal jugular vein using the Seldinger technique.  Evaluation Blood flow good Complications: No apparent complications Patient did tolerate procedure well. Chest X-ray ordered to verify placement.  CXR: pending.   Procedure performed under direct supervision of Dr. Halford Chessman and with ultrasound guidance for real time vessel cannulation.     [name], NP-C [facility] Pgr: [phone] or [phone]   01/11/2015, 2:23 PM  I was present for procedure.  Chesley Mires, MD [facility] 01/11/2015, 3:13 PM Pager:  [phone] After 3pm call: [phone]

## 2015-01-11 NOTE — Progress Notes (Signed)
NIF -60, VC 1.2 L good patient effort.

## 2015-01-11 NOTE — Progress Notes (Signed)
NIF -40, VC 1.6L with good pt effort

## 2015-01-11 NOTE — Progress Notes (Signed)
eLink Physician-Brief Progress Note Patient Name: Craig [name] DOB: May 07, 1941 MRN: [id number]   Date of Service  01/11/2015  HPI/Events of Note  66 M with myasthenia Gravis exacerbation on Plex treatments.  Transferred to ICU over concern over resp status and high risk for intubation.  Currently patient is HD stable with sats of 93% n RA with RR in the mid 20s.  By camera check the patient is in no acute resp distress but does have occ coughing suggestive he may not be clearing secretions effectively.  He is on IVIG, prednisone and mestinon.  Other medical issues are CHF/AF/HTN/DM/Bladder Ca with abd wall mets.  eICU Interventions  Plan of care per primary neuro team. Will monitor resp status closely via [facility]      Intervention Category Evaluation Type: New Patient Evaluation  DETERDING,ELIZABETH 01/11/2015, 12:38 AM

## 2015-01-11 NOTE — Progress Notes (Signed)
01/11/15 0537  RN called Aram Beecham MD and updated MD on Pt current respiratory status. RN requested AM chest xray. MD agreed. RN placed the order.   Wyn Quaker RN

## 2015-01-11 NOTE — Progress Notes (Signed)
UR Completed.  [phone]  

## 2015-01-11 NOTE — Progress Notes (Addendum)
Subjective: Patient reports that he feels short of breath.  O2 saturations have been 88-90%.  Secretions remain an issue as well.  Family is wishing to avoid intubation if possible.    Objective: Current vital signs: BP 129/79 mmHg  Pulse 95  Temp(Src) 97.8 F (36.6 C) (Oral)  Resp 24  Ht 5\' 7"  (1.702 m)  Wt 99 kg (218 lb 4.1 oz)  BMI 34.18 kg/m2  SpO2 95% Vital signs in last 24 hours: Temp:  [97.3 F (36.3 C)-98.1 F (36.7 C)] 97.8 F (36.6 C) (02/28 1202) Pulse Rate:  [39-107] 95 (02/28 1340) Resp:  [15-32] 24 (02/28 1340) BP: (80-133)/(37-94) 129/79 mmHg (02/28 1300) SpO2:  [88 %-100 %] 95 % (02/28 1340) Weight:  [99 kg (218 lb 4.1 oz)-99.791 kg (220 lb)] 99 kg (218 lb 4.1 oz) (02/28 0000)  Intake/Output from previous day: 02/27 0701 - 02/28 0700 In: 90 [IV Piggyback:60] Out: 175 [Urine:175] Intake/Output this shift: Total I/O In: 18 [Other:30; IV Piggyback:20] Out: -  Nutritional status: Diet NPO time specified  Neurologic Exam: HEENT-  Normocephalic, no lesions, without obvious abnormality.  Normal external eye and conjunctiva.  Normal TM's bilaterally.  Normal auditory canals and external ears. Normal external nose, mucus membranes and septum.  Normal pharynx. Cardiovascular- S1, S2 normal, pulses palpable throughout   Lungs- chest clear with diminished breath sounds Abdomen- soft, non-tender; bowel sounds normal; no masses,  no organomegaly Extremities- lower extremity edema Lymph-no adenopathy palpable Musculoskeletal-no joint tenderness, deformity or swelling Skin-warm and dry, no hyperpigmentation, vitiligo, or suspicious lesions  Neurological Examination Mental Status: Alert, oriented, thought content appropriate.  Speech fluent without evidence of aphasia.  Able to follow 3 step commands without difficulty. Cranial Nerves: II: Discs flat bilaterally; Visual fields grossly normal, pupils equal, round, reactive to light and accommodation III,IV, VI: ptosis  not present, extra-ocular motions intact bilaterally V,VII: smile symmetric, facial light touch sensation normal bilaterally VIII: hearing normal bilaterally IX,X: gag reflex present XI: bilateral shoulder shrug XII: midline tongue extension Motor: Patient able to lift LUE to 90 degrees and RUE to about 60 degrees.  Continued mild neck flexor weakness.  Able to lift each lower extremity off the bed Sensory: Pinprick and light touch intact throughout, bilaterally Deep Tendon Reflexes: 1+ and symmetric throughout Plantars: Right: downgoing   Left: downgoing  Lab Results: Basic Metabolic Panel:  Recent Labs Lab 01/10/15 1630  NA 151*  K 4.9  CL 109  CO2 30  GLUCOSE 344*  BUN 46*  CREATININE 1.24  CALCIUM 9.6    Liver Function Tests: No results for input(s): AST, ALT, ALKPHOS, BILITOT, PROT, ALBUMIN in the last 168 hours. No results for input(s): LIPASE, AMYLASE in the last 168 hours. No results for input(s): AMMONIA in the last 168 hours.  CBC:  Recent Labs Lab 01/08/15 1608 01/10/15 1630  WBC 12.8* 17.7*  NEUTROABS 11.8*  --   HGB 14.4 14.5  HCT 42.9 44.5  MCV 93 95.3  PLT  --  262    Cardiac Enzymes: No results for input(s): CKTOTAL, CKMB, CKMBINDEX, TROPONINI in the last 168 hours.  Lipid Panel: No results for input(s): CHOL, TRIG, HDL, CHOLHDL, VLDL, LDLCALC in the last 168 hours.  CBG:  Recent Labs Lab 01/11/15 0032 01/11/15 0410  GLUCAP 156* 128*    Microbiology: Results for orders placed or performed during the hospital encounter of 01/10/15  MRSA PCR Screening     Status: None   Collection Time: 01/11/15 12:58 AM  Result Value  Ref Range Status   MRSA by PCR NEGATIVE NEGATIVE Final    Comment:        The GeneXpert MRSA Assay (FDA approved for NASAL specimens only), is one component of a comprehensive MRSA colonization surveillance program. It is not intended to diagnose MRSA infection nor to guide or monitor treatment for MRSA  infections.     Coagulation Studies: No results for input(s): LABPROT, INR in the last 72 hours.  Imaging: Dg Chest 2 View  01/10/2015   CLINICAL DATA:  Chronic shortness of breath for 2 years. Cough last 24 hr.  EXAM: CHEST  2 VIEW  COMPARISON:  12/28/2014  FINDINGS: Left Port-A-Cath remains in place, unchanged. Right central line has been removed. There is cardiomegaly. No confluent airspace opacities or effusions. Mild elevation of the right hemidiaphragm, stable.  IMPRESSION: Cardiomegaly.  No active disease.   Electronically Signed   By: Rolm Baptise M.D.   On: 01/10/2015 18:01   Dg Chest Port 1 View  01/11/2015   CLINICAL DATA:  Short of breath was congestion  EXAM: PORTABLE CHEST - 1 VIEW  COMPARISON:  Radiograph 01/10/2015  FINDINGS: Left power port. Normal cardiac silhouette. Low lung volumes. There is left basilar atelectasis. No effusion or infiltrate. No pneumothorax.  IMPRESSION: Low lung volumes and left basilar atelectasis. No significant change.   Electronically Signed   By: Suzy Bouchard M.D.   On: 01/11/2015 09:00    Medications:  I have reviewed the patient's current medications. Scheduled: . amiodarone  100 mg Per Tube Daily  . antiseptic oral rinse  7 mL Mouth Rinse q12n4p  . chlorhexidine  15 mL Mouth Rinse BID  . famotidine  40 mg Oral Daily  . feeding supplement (VITAL HIGH PROTEIN)  1,000 mL Per Tube Q24H  . free water  100 mL Per Tube 3 times per day  . heparin  3,000 Units Intravenous Once  . Immune Globulin 5%  400 mg/kg Intravenous Q24 Hr x 5  . insulin aspart  0-15 Units Subcutaneous 6 times per day  . predniSONE  60 mg Oral Q breakfast  . pyridostigmine  60 mg Oral 4 times per day  . sodium chloride  500 mL Intravenous Once    Assessment/Plan: 74 year old male with MG.  Last PLEX treatment 12/13/14.  Remains on Mestinon and Prednisone.  Complains of shortness of breath.  NIF -40, VC 1.6L.  Patient received first IVIg treatment today which was in the  process of being scheduled as an outpatient prior to his presentation.  Concerned about his respiratory status and possible fatiguing, therefore would likely benefit from further PLEX.   White blood cell count increasing.  Possibly secondary to steroids but CXR with left atelectasis.    Recommendations: MG 1. Catheter to be placed.  PLEX treatments to be started in AM 2.  CCM to be consulted to follow respiratory status for need of intubation vs BIPAP.   3.  CCM to determine need for antibiotics.  Patient has been afebrile.   4.  Continue to follow NIF and VC daily  Acute renal insufficiency-likely secondary to dehydration 1.  Patient has received boluses-will start IVF's.    This patient is critically ill and at significant risk of neurological worsening, death and care requires constant monitoring of vital signs, hemodynamics,respiratory and cardiac monitoring, neurological assessment, discussion with family, other specialists and medical decision making of high complexity. I spent 45 minutes of neurocritical care time  in the care of  this patient.    LOS: 1 day   Alexis Goodell, MD [facility] [phone] 01/11/2015  2:25 PM

## 2015-01-11 NOTE — Consult Note (Signed)
PULMONARY / CRITICAL CARE MEDICINE   Name: Craig [name] MRN: [id number] DOB: 29-Jul-1941    ADMISSION DATE:  01/10/2015 CONSULTATION DATE:  01/11/15  REFERRING MD :  Dr. Doy Mince    CHIEF COMPLAINT:  Respiratory Failure   INITIAL PRESENTATION: [name], never smoker, with PMH of MG with recent flare and prolonged admission to [facility] who was admitted on 2/27 with worsening SOB, fatigue and low oxygen saturations.  Recently discharged 2/19 from CIR after MG exacerbation admit that required intubation / PLEX rx.     STUDIES:  2/28  CXR >> low lung volumes, L basilar atx    SIGNIFICANT EVENTS: 2/27  Admit with worsening SOB, fatigue and low O2 saturations.  NIF -30, -22, -20.  VC 450, 700, 610 ml 2/28  PCCM consulted for evaluation of worsening respiratory status.  NIF -23, VC 1.6L   HISTORY OF PRESENT ILLNESS:  [name], never smoker, known to PCCM SVC, with a PMH of Afib on Xarelto, HTN, CHF, PNA (2012), Ulcers, DM, CVA (2005), Myasthenia gravis (followed by Dr. 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    He was seen by his primary neurologist on 2/26 and was supposed to be initiated on IVIG at home.  Unfortunately, he had worsening of symptoms after seeing Neurology and family sought evaluation in ER on 2/27.  The patient was admitted by Neurology for acute myasthenia exacerbation, placed on IVIG, mestinon and continued prednisone 60 mg daily.  Xarelto was held on admit.  The patient had a moist cough and reports of fatigue overnight.  2/28 PCCM was consulted for pulmonary evaluation.      PAST MEDICAL HISTORY :   has a past medical history of  Atrial fibrillation, chronic; HTN (hypertension); CHF (congestive heart failure); Diabetes mellitus; Bladder cancer metastasized to intra-abdominal lymph nodes; and Myasthenia gravis.  has past surgical history that includes abdominal wall metastasis removal.   HOME MEDICATIONS:  Prior to Admission medications   Medication Sig Start Date End Date Taking? Authorizing Provider  amiodarone (PACERONE) 100 MG tablet Place 1 tablet (100 mg total) into feeding tube daily. 12/29/14  Yes Barton Dubois, MD  antiseptic oral rinse (CPC / CETYLPYRIDINIUM CHLORIDE 0.05%) 0.05 % LIQD solution 7 mLs by Mouth Rinse route 2 (two) times daily. 01/02/15  Yes Ivan Anchors Love, PA-C  artificial tears (LACRILUBE) OINT ophthalmic ointment Place into both eyes every 4 (four) hours as needed for dry eyes. 12/29/14  Yes Barton Dubois, MD  atorvastatin (LIPITOR) 40 MG tablet Place 1 tablet (40 mg total) into feeding tube daily. 12/29/14  Yes Barton Dubois, MD  cephALEXin [facility]) 250 MG/5ML suspension Place 5 mLs (250 mg total) into feeding tube every 8 (eight) hours. 01/02/15  Yes Ivan Anchors Love, PA-C  famotidine (PEPCID) 40 MG/5ML suspension Take 5 mLs (40 mg total) by mouth daily. 01/02/15  Yes Ivan Anchors Love, PA-C  insulin aspart (NOVOLOG FLEXPEN) 100 UNIT/ML FlexPen Inject 0-10 Units into the skin 4 (four) times daily. Use according to scale prior to tube feeds. 01/02/15  Yes Ivan Anchors Love, PA-C  ipratropium (ATROVENT HFA) 17 MCG/ACT inhaler Inhale 1 puff into the lungs every 6 (six)  hours as needed for wheezing.   Yes Historical Provider, MD  ipratropium (ATROVENT) 0.02 % nebulizer solution Inhale 2.5 mLs (0.5 mg total) into the lungs every 6 (six) hours as needed (wHEEZING.). 12/29/14  Yes Barton Dubois, MD  metoprolol tartrate (LOPRESSOR) 25 MG tablet Place 0.5 tablets (12.5 mg total) into feeding tube daily. 12/29/14  Yes Barton Dubois, MD  nateglinide (STARLIX) 120 MG tablet Place 1 tablet (120 mg total) into feeding tube 3 (three)  times daily. 01/02/15  Yes Ivan Anchors Love, PA-C  Nutritional Supplements (FEEDING SUPPLEMENT, JEVITY 1.2 CAL,) LIQD Place 390 mLs into feeding tube 4 (four) times daily -  with meals and at bedtime. 01/02/15  Yes Ivan Anchors Love, PA-C  potassium chloride 20 MEQ/15ML (10%) SOLN Place 15 mLs (20 mEq total) into feeding tube 3 (three) times daily. 01/02/15  Yes Ivan Anchors Love, PA-C  predniSONE (DELTASONE) 20 MG tablet Place 3 tablets (60 mg total) into feeding tube daily with breakfast. 01/02/15  Yes Ivan Anchors Love, PA-C  pyridostigmine (MESTINON) 60 MG tablet 1.5 tablets (90 mg total) by Per NG tube route 4 (four) times daily. 01/02/15  Yes Ivan Anchors Love, PA-C  rivaroxaban (XARELTO) 20 MG TABS tablet Take 1 tablet (20 mg total) by mouth daily with supper. Patient taking differently: Place 20 mg into feeding tube daily with supper.  12/29/14  Yes Barton Dubois, MD  chlorhexidine (PERIDEX) 0.12 % solution 15 mLs by Mouth Rinse route 2 (two) times daily. 12/29/14   Barton Dubois, MD  ondansetron (ZOFRAN) 4 MG tablet Take 1 tablet (4 mg total) by mouth every 6 (six) hours as needed for nausea. 01/02/15   Bary Leriche, PA-C  senna (SENOKOT) 8.6 MG TABS tablet Place 1 tablet (8.6 mg total) into feeding tube daily as needed for mild constipation. Patient not taking: Reported on 01/10/2015 01/02/15   Bary Leriche, PA-C  Water For Irrigation, Sterile (FREE WATER) SOLN Place 150 mLs into feeding tube 4 (four) times daily - after meals and at bedtime. 01/02/15   Bary Leriche, PA-C   Allergies  Allergen Reactions  . Ibuprofen Other (See Comments)    Interacts with another medication patient is taking    FAMILY HISTORY:  indicated that his mother is deceased. He indicated that his father is deceased.    SOCIAL HISTORY:  reports that he has never smoked. He does not have any smokeless tobacco history on file. He reports that he does not drink alcohol or use illicit drugs.  REVIEW OF SYSTEMS:   Gen: Denies fever,  chills, weight change, fatigue, night sweats HEENT: Denies blurred vision, double vision, hearing loss, tinnitus, sinus congestion, rhinorrhea, sore throat, neck stiffness, dysphagia PULM: Denies hemoptysis, wheezing.  Reports moist cough but not sputum production, sensation of not being able to "get enough air in".   CV: Denies chest pain, edema, paroxysmal nocturnal dyspnea, palpitations. Sleeps in recliner at baseline.  GI: Denies abdominal pain, nausea, vomiting, diarrhea, hematochezia, melena, constipation, change in bowel habits GU: Denies dysuria, hematuria, polyuria, oliguria, urethral discharge Endocrine: Denies hot or cold intolerance, polyuria, polyphagia or appetite change Derm: Denies rash, dry skin, scaling or peeling skin change Heme: Denies easy bruising, bleeding, bleeding gums Neuro: Denies headache, numbness, loss of memory or consciousness. Reports weakness and slurred speech.    SUBJECTIVE:   VITAL SIGNS: Temp:  [97.3 F (36.3 C)-98.1 F (36.7 C)] 97.6 F (36.4 C) (02/28 0726) Pulse Rate:  [39-107] 107 (02/28 1130) Resp:  [15-32]  25 (02/28 1130) BP: (80-131)/(37-94) 124/80 mmHg (02/28 1130) SpO2:  [88 %-100 %] 90 % (02/28 1130) Weight:  [218 lb 4.1 oz (99 kg)-220 lb (99.791 kg)] 218 lb 4.1 oz (99 kg) (02/28 0000)   HEMODYNAMICS:     VENTILATOR SETTINGS:     INTAKE / OUTPUT:  Intake/Output Summary (Last 24 hours) at 01/11/15 1141 Last data filed at 01/11/15 0900  Gross per 24 hour  Intake    140 ml  Output    175 ml  Net    -35 ml    PHYSICAL EXAMINATION: General:  Chronically ill in NAD Neuro:  Generalized weakness, AAOx4, mild slurring but clear / understandable speech HEENT:  Mm pink/moist, no jvd  Cardiovascular:  s1s2 rrr, no m/r/g Lungs:  resp's even/non-labored, lungs bilaterally diminished but clear  Abdomen:  Obese, soft, NT, PEG c/d/i Musculoskeletal:  No acute deformities, generalized weakness but appears stronger than last admit Skin:   Warm/dry, no edema  LABS:  CBC  Recent Labs Lab 01/08/15 1608 01/10/15 1630  WBC 12.8* 17.7*  HGB 14.4 14.5  HCT 42.9 44.5  PLT  --  262   Coag's No results for input(s): APTT, INR in the last 168 hours.   BMET  Recent Labs Lab 01/10/15 1630  NA 151*  K 4.9  CL 109  CO2 30  BUN 46*  CREATININE 1.24  GLUCOSE 344*   Electrolytes  Recent Labs Lab 01/10/15 1630  CALCIUM 9.6   Sepsis Markers No results for input(s): LATICACIDVEN, PROCALCITON, O2SATVEN in the last 168 hours.   ABG No results for input(s): PHART, PCO2ART, PO2ART in the last 168 hours.   Liver Enzymes No results for input(s): AST, ALT, ALKPHOS, BILITOT, ALBUMIN in the last 168 hours.   Cardiac Enzymes No results for input(s): TROPONINI, PROBNP in the last 168 hours.   Glucose  Recent Labs Lab 01/11/15 0032 01/11/15 0410  GLUCAP 156* 128*    Imaging Dg Chest 2 View  01/10/2015   CLINICAL DATA:  Chronic shortness of breath for 2 years. Cough last 24 hr.  EXAM: CHEST  2 VIEW  COMPARISON:  12/28/2014  FINDINGS: Left Port-A-Cath remains in place, unchanged. Right central line has been removed. There is cardiomegaly. No confluent airspace opacities or effusions. Mild elevation of the right hemidiaphragm, stable.  IMPRESSION: Cardiomegaly.  No active disease.   Electronically Signed   By: Rolm Baptise M.D.   On: 01/10/2015 18:01     ASSESSMENT / PLAN:  NEUROLOGIC A:   Myasthenia Gravis with Acute Exacerbation  P:   RASS goal: n/a PT as able  Neurology following PLEX / IVIG per Neuro discretion  Activity as tolerated  PULMONARY OETT A: Acute Respiratory Failure - in setting of MG exacerbation.  Actually appears stronger than prior admission.   OSA - on bipap 18/8 at baseline P:   Q12 assessment of NIF / VC Cycle on BiPAP - 2 hours on, 4 off during day to avoid respiratory muscle fatigue.  QHS support 18/8 baseline  Pulmonary hygiene:  Mobilize, chest vest  Oral care Trend  CXR Aspiration precautions  CARDIOVASCULAR L CW Port >> A:  Chronic Atrial Fibrillation - on Xarelto at baseline, held on admission  HTN HLD P:  Monitor BP trend  Hold home lipitor, lopressor, Xarelto  Resume amiodarone PRN lopressor for HR > 115 or SBP > 170  RENAL A:   Hypernatremia  P:   Monitor BMP  Replace electrolytes as indicated  Free water  100 ml Q8  GASTROINTESTINAL / GU Chronic PEG >>  A:   Metastatic Bladder Cancer - mets to abdominal wall, CT 2/11 at CIR with progressive omental / peritoneal metastasis GERD Dysphagia - s/p PEG, bolus feeding at home with reported reflux   P:   Trial nocturnal continuous feeding  NPO Begin TF per nutrition  Need to obtain records from prior ONC work up   HEMATOLOGIC A:   Chronic Anticoagulation - on Xarelto in setting of Afib P:  Hold Xarelto, consider restart in am 2/29 vs heparin gtt   INFECTIOUS A:   LLL ATX P:   Monitor fever curve / leukocytosis  ENDOCRINE A:   DM   P:   SSI     FAMILY  - Updates: patient and family updated at bedside. Will trial bipap in the hopes to avoid intubation.  He does appear stronger this admission but he may worsen with therapy.  They understand that he may require intubation if he were to decline further.    - Inter-disciplinary family meet or Palliative Care meeting due by: 3/6    [name], NP-C [place] Pgr: [phone] or [phone]   01/11/2015, 11:41 AM   Reviewed above, and examined.  74 yo male with hx of metastatic bladder cancer and recent dx of myasthenia developed progressive dyspnea, fatigue, and hypoxia.  He was admitted by neurology service and plan for plasma exchange.  He has improvement in WOB and oxygenation with BiPAP.    He has decreased breath sounds out bases with faint basilar crackles, abd soft, he moves all extremities and mental status intact.  Will proceed with plasma exchange, continue prednisone/mestinon.  Will  continue qhs BiPAP and prn during the day.  Monitor respiratory status in ICU >> currently he does not need intubation.  D/w Dr. Doy Mince, and updated family at bedside.  CC time by me independent of APP and procedure time is 40 minutes.  Chesley Mires, MD [facility] Pulmonary/Critical Care 01/11/2015, 3:18 PM Pager:  [phone] After 3pm call: [phone]

## 2015-01-11 NOTE — Progress Notes (Addendum)
01/11/15 0507  Pt having some complaints of SOB. RN at bedside. Pt still rhonchus with a congested cough. Pt maintaining clear air way, but with strong cough. Pt unable to cough up any mucous. RN worked with Member with I/S and suctioned a few large pieces of mucous. Pt actively suctioning with wife at bedside. eLink RN made aware. Pt on 3/L [place], O2 Sats 92% with RR in the 20s. eLink to continue to monitor.   RN to monitor. RT made aware.

## 2015-01-12 ENCOUNTER — Inpatient Hospital Stay (HOSPITAL_COMMUNITY): Payer: PPO

## 2015-01-12 DIAGNOSIS — J9600 Acute respiratory failure, unspecified whether with hypoxia or hypercapnia: Secondary | ICD-10-CM

## 2015-01-12 LAB — BASIC METABOLIC PANEL
ANION GAP: 5 (ref 5–15)
Anion gap: 7 (ref 5–15)
BUN: 28 mg/dL — ABNORMAL HIGH (ref 6–23)
BUN: 30 mg/dL — AB (ref 6–23)
CALCIUM: 8.5 mg/dL (ref 8.4–10.5)
CHLORIDE: 114 mmol/L — AB (ref 96–112)
CHLORIDE: 118 mmol/L — AB (ref 96–112)
CO2: 32 mmol/L (ref 19–32)
CO2: 23 mmol/L (ref 19–32)
CREATININE: 0.86 mg/dL (ref 0.50–1.35)
Calcium: 8 mg/dL — ABNORMAL LOW (ref 8.4–10.5)
Creatinine, Ser: 0.96 mg/dL (ref 0.50–1.35)
GFR calc Af Amer: >90 mL/min (ref >90)
GFR calc non Af Amer: 80 mL/min — ABNORMAL LOW (ref >90)
GFR, EST NON AFRICAN AMERICAN: 83 mL/min — AB (ref >90)
Glucose, Bld: 120 mg/dL — ABNORMAL HIGH (ref 70–99)
Glucose, Bld: 320 mg/dL — ABNORMAL HIGH (ref 70–99)
POTASSIUM: 3.3 mmol/L — AB (ref 3.5–5.1)
Potassium: 3.8 mmol/L (ref 3.5–5.1)
Sodium: 151 mmol/L — ABNORMAL HIGH (ref 135–145)
Sodium: 148 mmol/L — ABNORMAL HIGH (ref 135–145)

## 2015-01-12 LAB — CBC
HEMATOCRIT: 39.1 % (ref 39.0–52.0)
HEMOGLOBIN: 12.9 g/dL — AB (ref 13.0–17.0)
MCH: 31.2 pg (ref 26.0–34.0)
MCHC: 33 g/dL (ref 30.0–36.0)
MCV: 94.4 fL (ref 78.0–100.0)
Platelets: 182 10*3/uL (ref 150–400)
RBC: 4.14 MIL/uL — AB (ref 4.22–5.81)
RDW: 15.1 % (ref 11.5–15.5)
WBC: 11.3 10*3/uL — ABNORMAL HIGH (ref 4.0–10.5)

## 2015-01-12 LAB — POCT I-STAT 3, ART BLOOD GAS (G3+)
ACID-BASE EXCESS: 4 mmol/L — AB (ref 0.0–2.0)
Bicarbonate: 28.4 mEq/L — ABNORMAL HIGH (ref 20.0–24.0)
O2 Saturation: 98 %
PCO2 ART: 39.1 mmHg (ref 35.0–45.0)
PH ART: 7.469 — AB (ref 7.350–7.450)
TCO2: 30 mmol/L (ref 0–100)
pO2, Arterial: 91 mmHg (ref 80.0–100.0)

## 2015-01-12 LAB — POCT I-STAT, CHEM 8
BUN: 30 mg/dL — ABNORMAL HIGH (ref 6–23)
CALCIUM ION: 1.21 mmol/L (ref 1.13–1.30)
Chloride: 112 mmol/L (ref 96–112)
Creatinine, Ser: 0.9 mg/dL (ref 0.50–1.35)
Glucose, Bld: 211 mg/dL — ABNORMAL HIGH (ref 70–99)
HEMATOCRIT: 42 % (ref 39.0–52.0)
Hemoglobin: 14.3 g/dL (ref 13.0–17.0)
Potassium: 4.9 mmol/L (ref 3.5–5.1)
SODIUM: 151 mmol/L — AB (ref 135–145)
TCO2: 25 mmol/L (ref 0–100)

## 2015-01-12 LAB — GLUCOSE, CAPILLARY
GLUCOSE-CAPILLARY: 215 mg/dL — AB (ref 70–99)
Glucose-Capillary: 101 mg/dL — ABNORMAL HIGH (ref 70–99)
Glucose-Capillary: 148 mg/dL — ABNORMAL HIGH (ref 70–99)
Glucose-Capillary: 269 mg/dL — ABNORMAL HIGH (ref 70–99)
Glucose-Capillary: 339 mg/dL — ABNORMAL HIGH (ref 70–99)
Glucose-Capillary: 292 mg/dL — ABNORMAL HIGH (ref 70–99)

## 2015-01-12 LAB — CLOSTRIDIUM DIFFICILE BY PCR: Toxigenic C. Difficile by PCR: POSITIVE — AB

## 2015-01-12 MED ORDER — SODIUM CHLORIDE 0.9 % IV SOLN
Freq: Once | INTRAVENOUS | Status: DC
  Filled 2015-01-12: qty 200

## 2015-01-12 MED ORDER — ALBUMIN HUMAN 25 % IV SOLN
INTRAVENOUS | Status: AC
  Administered 2015-01-12 (×3): via INTRAVENOUS_CENTRAL
  Filled 2015-01-12 (×5): qty 200

## 2015-01-12 MED ORDER — JEVITY 1.2 CAL PO LIQD
1000.0000 mL | ORAL | Status: DC
  Administered 2015-01-12 – 2015-01-14 (×3): 1000 mL
  Administered 2015-01-16: 21:00:00
  Administered 2015-01-16 – 2015-01-18 (×3): 1000 mL
  Administered 2015-01-19: 14:00:00
  Administered 2015-01-20 – 2015-01-21 (×2): 1000 mL
  Filled 2015-01-12 (×21): qty 1000
  Filled 2015-01-12: qty 237
  Filled 2015-01-12: qty 1000

## 2015-01-12 MED ORDER — PIPERACILLIN-TAZOBACTAM 3.375 G IVPB
3.3750 g | Freq: Three times a day (TID) | INTRAVENOUS | Status: DC
  Administered 2015-01-12 – 2015-01-14 (×7): 3.375 g via INTRAVENOUS
  Filled 2015-01-12 (×8): qty 50

## 2015-01-12 MED ORDER — ACETYLCYSTEINE 20 % IN SOLN
3.0000 mL | Freq: Three times a day (TID) | RESPIRATORY_TRACT | Status: DC
  Administered 2015-01-12: 3 mL via RESPIRATORY_TRACT
  Administered 2015-01-12 – 2015-01-13 (×3): via RESPIRATORY_TRACT
  Administered 2015-01-13 – 2015-01-14 (×4): 3 mL via RESPIRATORY_TRACT
  Administered 2015-01-15: 09:00:00 via RESPIRATORY_TRACT
  Filled 2015-01-12 (×12): qty 4

## 2015-01-12 MED ORDER — DEXTROSE 5 % IV SOLN
INTRAVENOUS | Status: DC
  Administered 2015-01-12 – 2015-01-15 (×3): via INTRAVENOUS

## 2015-01-12 MED ORDER — RIVAROXABAN 20 MG PO TABS
20.0000 mg | ORAL_TABLET | Freq: Every day | ORAL | Status: DC
  Administered 2015-01-12 – 2015-01-21 (×10): 20 mg
  Filled 2015-01-12 (×10): qty 1

## 2015-01-12 MED ORDER — POTASSIUM CHLORIDE 20 MEQ/15ML (10%) PO SOLN
40.0000 meq | Freq: Two times a day (BID) | ORAL | Status: AC
  Administered 2015-01-12 (×2): 40 meq
  Filled 2015-01-12 (×2): qty 30

## 2015-01-12 MED ORDER — ACETAMINOPHEN 160 MG/5ML PO SOLN
650.0000 mg | ORAL | Status: DC | PRN
  Administered 2015-01-12: 650 mg
  Filled 2015-01-12: qty 20.3

## 2015-01-12 MED ORDER — DIPHENHYDRAMINE HCL 25 MG PO CAPS: 25.0000 mg | ORAL_CAPSULE | Freq: Four times a day (QID) | ORAL | Status: DC | PRN

## 2015-01-12 MED ORDER — ACETAMINOPHEN 325 MG PO TABS: 650.0000 mg | ORAL_TABLET | ORAL | Status: DC | PRN

## 2015-01-12 MED ORDER — ACETYLCYSTEINE 20 % IN SOLN
3.0000 mL | Freq: Three times a day (TID) | RESPIRATORY_TRACT | Status: DC
  Filled 2015-01-12 (×3): qty 4

## 2015-01-12 MED ORDER — SODIUM CHLORIDE 0.9 % IV SOLN
4.0000 g | Freq: Once | INTRAVENOUS | Status: AC
  Administered 2015-01-12: 2 g via INTRAVENOUS
  Filled 2015-01-12: qty 40

## 2015-01-12 MED ORDER — SODIUM CHLORIDE 0.9 % IV SOLN
4.0000 g | Freq: Once | INTRAVENOUS | Status: DC
  Administered 2015-01-14: 2 g via INTRAVENOUS
  Filled 2015-01-12: qty 40

## 2015-01-12 MED ORDER — ACD FORMULA A 0.73-2.45-2.2 GM/100ML VI SOLN
Status: AC
  Administered 2015-01-12: 717 mL via INTRAVENOUS
  Filled 2015-01-12: qty 1000

## 2015-01-12 MED ORDER — ALBUTEROL SULFATE (2.5 MG/3ML) 0.083% IN NEBU
2.5000 mg | INHALATION_SOLUTION | Freq: Three times a day (TID) | RESPIRATORY_TRACT | Status: DC
Start: 2015-01-12 — End: 2015-01-21
  Administered 2015-01-12 – 2015-01-21 (×27): 2.5 mg via RESPIRATORY_TRACT
  Filled 2015-01-12 (×28): qty 3

## 2015-01-12 MED ORDER — ACETAMINOPHEN 325 MG PO TABS
650.0000 mg | ORAL_TABLET | ORAL | Status: DC | PRN
  Filled 2015-01-12: qty 2

## 2015-01-12 MED ORDER — PRO-STAT SUGAR FREE PO LIQD
30.0000 mL | Freq: Every day | ORAL | Status: DC
  Administered 2015-01-12 – 2015-01-22 (×11): 30 mL
  Filled 2015-01-12 (×13): qty 30

## 2015-01-12 MED ORDER — ACD FORMULA A 0.73-2.45-2.2 GM/100ML VI SOLN
500.0000 mL | Status: DC
  Administered 2015-01-12: 717 mL via INTRAVENOUS
  Filled 2015-01-12: qty 500

## 2015-01-12 MED ORDER — CALCIUM CARBONATE ANTACID 500 MG PO CHEW: 2.0000 | CHEWABLE_TABLET | ORAL | Status: AC

## 2015-01-12 MED ORDER — HEPARIN SODIUM (PORCINE) 1000 UNIT/ML IJ SOLN: 1000.0000 [IU] | Freq: Once | INTRAMUSCULAR | Status: DC

## 2015-01-12 MED ORDER — ACD FORMULA A 0.73-2.45-2.2 GM/100ML VI SOLN
500.0000 mL | Status: DC
  Filled 2015-01-12 (×2): qty 500

## 2015-01-12 MED ORDER — HEPARIN SODIUM (PORCINE) 1000 UNIT/ML IJ SOLN
1000.0000 [IU] | Freq: Once | INTRAMUSCULAR | Status: AC
  Administered 2015-01-12: 1000 [IU]

## 2015-01-12 MED ORDER — SODIUM CHLORIDE 0.9 % IV SOLN
Freq: Once | INTRAVENOUS | Status: DC
  Filled 2015-01-12 (×2): qty 200

## 2015-01-12 NOTE — Progress Notes (Signed)
NIF -14, VC 1.4L/min

## 2015-01-12 NOTE — Progress Notes (Signed)
VC 1.5 at 2130.

## 2015-01-12 NOTE — Progress Notes (Signed)
Subjective: Continues to be SOB  Exam: Filed Vitals:   01/12/15 1000  BP:   Pulse: 83  Temp:   Resp: 20   Gen: In bed, on Bipap MS: Awake, alert GM:WNUU, face symmetric(thoguh limited by BiPAP Motor: good strength in LE, 4-/5 shoulder abduction on right, 4/5 on left.  Sensory:intact to LT   Impression: 74 yo M with SOB in the setting of myasthenia gravis. I suspect that this does represent continued disease activity. He is being started on a repeat course of plasma exchange. He responded last time, though he did have six treatments due to incomplete improvement.   Recommendations: 1) PLEX QOD x 5 treatments.  2) will continue to follow.   Roland Rack, MD [facility] [phone]  If 7pm- 7am, please page neurology on call as listed in [place].

## 2015-01-12 NOTE — Progress Notes (Signed)
TPE 1/5 completed without issue. 1.0 volume exchange as ordered. Pt tolerated well without issue. Gave 2g calcium gluconate, 200cc saline bolus (for bp drop into 90s), and heparin for cath dwell. Report given to primary RN. Bp 114/77, HR 83, sats 99% BIPAP. Continue with exchanges every other day for total of 5 per note. If any changes to regimen please call hemo.

## 2015-01-12 NOTE — Progress Notes (Addendum)
Inpatient Diabetes Program Recommendations  AACE/ADA: New Consensus Statement on Inpatient Glycemic Control (2013)  Target Ranges:  Prepandial:   less than 140 mg/dL      Peak postprandial:   less than 180 mg/dL (1-2 hours)      Critically ill patients:  140 - 180 mg/dL   Results for DATHAN, ATTIA (MRN [id number]) as of 01/12/2015 10:14  Ref. Range 01/11/2015 04:10 01/11/2015 20:38 01/12/2015 00:06 01/12/2015 04:09 01/12/2015 07:46  Glucose-Capillary Latest Range: 70-99 mg/dL 128 (H) 280 (H) 215 (H) 101 (H) 148 (H)    Current orders for Inpatient glycemic control: Novolog 0-15 units Q4H  Inpatient Diabetes Program Recommendations Insulin - Basal: Please consider ordering low dose basal insulin. Recommend starting with Lantus 5 units Q24H starting now.  Thanks, Barnie Alderman, RN, MSN, CCRN, CDE Diabetes Coordinator Inpatient Diabetes Program [phone] (Team Pager) [phone] (AP office) [phone] [facility] office)

## 2015-01-12 NOTE — Progress Notes (Addendum)
PULMONARY / CRITICAL CARE MEDICINE   Name: Craig [name] MRN: [id number] DOB: 1941-02-04    ADMISSION DATE:  01/10/2015 CONSULTATION DATE:  01/11/15  REFERRING MD :  Dr. Doy Mince   CHIEF COMPLAINT:  Respiratory Failure  INITIAL PRESENTATION: [name], never smoker, with PMH of MG with recent flare and prolonged admission to [facility] who was admitted on 2/27 with worsening SOB, fatigue and low oxygen saturations.  Recently discharged 2/19 from CIR after MG exacerbation admit that required intubation / PLEX rx.    STUDIES:  2/28  CXR >> low lung volumes, L basilar atx   SIGNIFICANT EVENTS: 2/27  Admit with worsening SOB, fatigue and low O2 saturations.  NIF -30, -22, -20.  VC 450, 700, 610 ml 2/28  PCCM consulted for evaluation of worsening respiratory status.  NIF -40, VC 1.6L 2/29 >>rt base atx  SUBJECTIVE: remains on bipap  VITAL SIGNS: Temp:  [97.4 F (36.3 C)-98.2 F (36.8 C)] 97.8 F (36.6 C) (02/29 0409) Pulse Rate:  [39-107] 101 (02/29 0600) Resp:  [15-40] 17 (02/29 0600) BP: (82-133)/(47-103) 108/77 mmHg (02/29 0600) SpO2:  [88 %-97 %] 96 % (02/29 0600) FiO2 (%):  [70 %] 70 % (02/29 0400) Weight:  [216 lb 0.8 oz (98 kg)] 216 lb 0.8 oz (98 kg) (02/29 0429)   HEMODYNAMICS:     VENTILATOR SETTINGS: Vent Mode:  [-]  FiO2 (%):  [70 %] 70 %   INTAKE / OUTPUT:  Intake/Output Summary (Last 24 hours) at 01/12/15 0636 Last data filed at 01/12/15 0600  Gross per 24 hour  Intake 1664.25 ml  Output   1025 ml  Net 639.25 ml    PHYSICAL EXAMINATION: General:  Chronically ill in NAD Neuro:  Generalized weakness, AAOx4, mild slurring but clear / understandable speech HEENT:  Mm pink/moist, no jvd  Cardiovascular:  s1s2 rrr, no m/r/g Lungs:  Reduced bs rt Abdomen:  Obese, soft, NT, PEG c/d/i Musculoskeletal:  No acute deformities, generalized weakness but appears stronger than last admit Skin:  Warm/dry, no edema  LABS:  CBC  Recent Labs Lab 01/08/15 1608  01/10/15 1630 01/12/15 0500  WBC 12.8* 17.7* 11.3*  HGB 14.4 14.5 12.9*  HCT 42.9 44.5 39.1  PLT  --  262 182   Coag's No results for input(s): APTT, INR in the last 168 hours.   BMET  Recent Labs Lab 01/10/15 1630  NA 151*  K 4.9  CL 109  CO2 30  BUN 46*  CREATININE 1.24  GLUCOSE 344*   Electrolytes  Recent Labs Lab 01/10/15 1630  CALCIUM 9.6   Sepsis Markers No results for input(s): LATICACIDVEN, PROCALCITON, O2SATVEN in the last 168 hours.   ABG No results for input(s): PHART, PCO2ART, PO2ART in the last 168 hours.   Liver Enzymes No results for input(s): AST, ALT, ALKPHOS, BILITOT, ALBUMIN in the last 168 hours.   Cardiac Enzymes No results for input(s): TROPONINI, PROBNP in the last 168 hours.   Glucose  Recent Labs Lab 01/11/15 0032 01/11/15 0410 01/11/15 2038 01/12/15 0006 01/12/15 0409  GLUCAP 156* 128* 280* 215* 101*    Imaging Dg Chest Port 1 View  01/11/2015   CLINICAL DATA:  Acute respiratory failure. Hemodialysis catheter insertion.  EXAM: PORTABLE CHEST - 1 VIEW  COMPARISON:  01/11/2015  FINDINGS: Right dialysis catheter has been placed with the tip in the SVC. Left Port-A-Cath is unchanged. No pneumothorax. Mild cardiomegaly. Left basilar atelectasis. Minimal right base atelectasis. No overt edema.  IMPRESSION: Right dialysis  catheter placement with the tip in the SVC. No pneumothorax.  Bibasilar atelectasis, left greater than right.   Electronically Signed   By: Rolm Baptise M.D.   On: 01/11/2015 15:15   Dg Chest Port 1 View  01/11/2015   CLINICAL DATA:  Short of breath was congestion  EXAM: PORTABLE CHEST - 1 VIEW  COMPARISON:  Radiograph 01/10/2015  FINDINGS: Left power port. Normal cardiac silhouette. Low lung volumes. There is left basilar atelectasis. No effusion or infiltrate. No pneumothorax.  IMPRESSION: Low lung volumes and left basilar atelectasis. No significant change.   Electronically Signed   By: Suzy Bouchard M.D.   On:  01/11/2015 09:00     ASSESSMENT / PLAN:  NEUROLOGIC A:   Myasthenia Gravis with Acute Exacerbation  P:   RASS goal: n/a PT as able  Neurology following PLEX 2/29 >>  S/p IVIG 2/28 per Neuro discretion  Activity as tolerated Role pred, will this harm? Will d/w neuro Daily NIf, VC  PULMONARY OETT A: Acute Respiratory Failure - in setting of MG exacerbation.  Actually appears stronger than prior admission.   OSA - on bipap 18/8 at baseline Rt base atx P:   Q12 assessment of NIF / VC Cycle on BiPAP - increase cycle 4 on 4 off,   QHS support 18/8 baseline  Pulmonary hygiene:  Mobilize, chest vest  Oral care Trend CXR Aspiration precautions Add mucmoysts x 49 hr, obtain abg With atx, increase EPAP 10-12  CARDIOVASCULAR L CW Port >> A:  Chronic Atrial Fibrillation - on Xarelto at baseline, held on admission  HTN HLD P:  Monitor BP trend  Hold home lipitor, lopressor Restart Xarelto  Continue home amiodarone PRN lopressor for HR > 115 or SBP > 170  RENAL A:   Hypernatremia, hypokalemia P:   Monitor BMP  Replace electrolytes as indicated  Free water 100 ml Q8, dc as resp status tenuous bmet in afternoon , add d5w Mag, phos in am   GASTROINTESTINAL / GU Chronic PEG >>  A:   Metastatic Bladder Cancer - mets to abdominal wall, CT 2/11 at CIR with progressive omental / peritoneal metastasis GERD Dysphagia - s/p PEG, bolus feeding at home with reported reflux   P:   Trial nocturnal continuous tube feeding - tolerating well  NPO  HEMATOLOGIC A:   Chronic Anticoagulation - on Xarelto in setting of Afib P:  Restart Xarelto  INFECTIOUS A:   LLL ATX, increase rt base atx - concern asp PNA developing Nosocomial exposure last 90 days P:   Add zosyn, send sputum  ENDOCRINE A:   DM   P:   SSI    FAMILY  - Updates: patient and family updated at bedside. Will trial bipap in the hopes to avoid intubation.  He does appear stronger this admission but he may  worsen with therapy.  They understand that he may require intubation if he were to decline further.    - Inter-disciplinary family meet or Palliative Care meeting due by: 3/6   Olam Idler, MD 01/12/2015, 8:04 AM PGY-2, [facility]  STAFF NOTE: I, Merrie Roof, MD FACP have personally reviewed patient's available data, including medical history, events of note, physical examination and test results as part of my evaluation. I have discussed with resident/NP and other care providers such as pharmacist, RN and RRT. In addition, I personally evaluated patient and elicited key findings of: remains on BIPAp with worsening reduced bs and rt base atx on  pcxr, increase epap, change to 4 hrs on 4 hrs off, abg required, change to d5w, zosyn addition, assess sputrum, active infection will set his MG back, avoid, correct NA, supp k The patient is critically ill with multiple organ systems failure and requires high complexity decision making for assessment and support, frequent evaluation and titration of therapies, application of advanced monitoring technologies and extensive interpretation of multiple databases.   Critical Care Time devoted to patient care services described in this note is30 Minutes. This time reflects time of care of this signee: Merrie Roof, MD FACP. This critical care time does not reflect procedure time, or teaching time or supervisory time of PA/NP/Med student/Med Resident etc but could involve care discussion time. Rest per NP/medical resident whose note is outlined above and that I agree with   Lavon Paganini. Titus Mould, MD, [place] Pgr: [place] [facility] 01/12/2015 8:59 AM

## 2015-01-12 NOTE — Progress Notes (Signed)
ANTIBIOTIC CONSULT NOTE - INITIAL  Pharmacy Consult for zosyn Indication: pneumonia  Allergies  Allergen Reactions  . Ibuprofen Other (See Comments)    Interacts with another medication patient is taking    Patient Measurements: Height: 5\' 7"  (170.2 cm) Weight: 216 lb 0.8 oz (98 kg) IBW/kg (Calculated) : 66.1 Adjusted Body Weight:  Vital Signs: Temp: 98.4 F (36.9 C) (02/29 0742) Temp Source: Oral (02/29 0742) BP: 102/71 mmHg (02/29 1101) Pulse Rate: 86 (02/29 1101) Intake/Output from previous day: 02/28 0701 - 02/29 0700 In: 1664.3 [I.V.:1131.3; NG/GT:483; IV Piggyback:20] Out: 6294 [Urine:1025] Intake/Output from this shift: Total I/O In: 558.8 [I.V.:298.8; Other:80; NG/GT:130; IV Piggyback:50] Out: 200 [Urine:200]  Labs:  Recent Labs  01/10/15 1630 01/12/15 0500  WBC 17.7* 11.3*  HGB 14.5 12.9*  PLT 262 182  CREATININE 1.24 0.86   Estimated Creatinine Clearance: 84.1 mL/min (by C-G formula based on Cr of 0.86). No results for input(s): VANCOTROUGH, VANCOPEAK, VANCORANDOM, GENTTROUGH, GENTPEAK, GENTRANDOM, TOBRATROUGH, TOBRAPEAK, TOBRARND, AMIKACINPEAK, AMIKACINTROU, AMIKACIN in the last 72 hours.   Microbiology: Recent Results (from the past 720 hour(s))  MRSA PCR Screening     Status: None   Collection Time: 12/27/14  3:20 AM  Result Value Ref Range Status   MRSA by PCR NEGATIVE NEGATIVE Final    Comment:        The GeneXpert MRSA Assay (FDA approved for NASAL specimens only), is one component of a comprehensive MRSA colonization surveillance program. It is not intended to diagnose MRSA infection nor to guide or monitor treatment for MRSA infections.   Urine culture     Status: None   Collection Time: 12/30/14  4:24 PM  Result Value Ref Range Status   Specimen Description URINE, CLEAN CATCH  Final   Special Requests NONE  Final   Colony Count   Final    >=100,000 COLONIES/ML Performed at [facility]    Culture   Final   ESCHERICHIA COLI Performed at [facility]    Report Status 01/01/2015 FINAL  Final   Organism ID, Bacteria ESCHERICHIA COLI  Final      Susceptibility   Escherichia coli - MIC*    AMPICILLIN >=32 RESISTANT Resistant     CEFAZOLIN <=4 SENSITIVE Sensitive     CEFTRIAXONE <=1 SENSITIVE Sensitive     CIPROFLOXACIN <=0.25 SENSITIVE Sensitive     GENTAMICIN <=1 SENSITIVE Sensitive     LEVOFLOXACIN <=0.12 SENSITIVE Sensitive     NITROFURANTOIN 64 INTERMEDIATE Intermediate     TOBRAMYCIN <=1 SENSITIVE Sensitive     TRIMETH/SULFA >=320 RESISTANT Resistant     PIP/TAZO <=4 SENSITIVE Sensitive     * ESCHERICHIA COLI  MRSA PCR Screening     Status: None   Collection Time: 01/11/15 12:58 AM  Result Value Ref Range Status   MRSA by PCR NEGATIVE NEGATIVE Final    Comment:        The GeneXpert MRSA Assay (FDA approved for NASAL specimens only), is one component of a comprehensive MRSA colonization surveillance program. It is not intended to diagnose MRSA infection nor to guide or monitor treatment for MRSA infections.     Medical History: Past Medical History  Diagnosis Date  . Atrial fibrillation, chronic     on Xarelto   . HTN (hypertension)   . CHF (congestive heart failure)   . Diabetes mellitus   . 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 Myasthenia gravis  Assessment: 66 yom admitted with a myasthenia gravis crisis after a recent discharg. Was to start IVIG at home. Received 1x dose yesterday now initiating PLEX. Also to start zosyn for possible PNA. Pt is afebrile and WBC is elevated.   Goal of Therapy:  Eradication of infection  Plan:  1. Zosyn 3.375gm IV Q8H (4 hr inf) 2. F/u renal fxn, C&S, clinical status   Naydene Kamrowski, Rande Lawman 01/12/2015,11:11 AM

## 2015-01-12 NOTE — Progress Notes (Signed)
INITIAL NUTRITION ASSESSMENT  DOCUMENTATION CODES Per approved criteria  -Obesity Unspecified   INTERVENTION: D/C Vital High Protein  Initiate TF via G-tube with Jevity 1.2 at 65 ml/hr and Prostat 30 ml daily   Provides 1972 kcal (100% of estimated needs), 101 grams of protein, 1259 ml of H20  NUTRITION DIAGNOSIS:  Inadequate oral intake related to dysphagia as evidenced by NPO status.   Goal: Pt to meet >/= 90% of estimated energy needs.  Monitor:  Tube feed tolerance/adequacy, weight, labs, I/O's  Reason for Assessment: Consult for enteral/tube feeding initiation and management   74 y.o. male  Admitting Dx: SOB  ASSESSMENT: 74 y/o male with past medical history of MG with recent flare and prolonged admission to [facility]. He was admitted on 2/27 with worsening SOB, fatigue and low oxygen saturations. Recently discharged 2/19 from CIR after MG exacerbation that required intubation. History of metastatic bladder cancer, DM2, CHF. G port placed 2/12 due to dysphagia. Currently receiving Vital HP at 40 ml/hr.   Labs- high Na, Cl; low K, Ca BiPAP Wife at bedside upon assessment. Discussed bolus feedings/tolerance with wife. She reported some coughing; unsure if related to reflux. She reported not giving Prostat at home, because she did not know what amount to give.  Wt history shows stable weight since last admission; NFPE did not indicate muscle mass or body fat depletion.  Usual bolus tube feeding routine: Bolus tube feeding of Jevity 1.2 via PEG 390 ml 4 times daily with 30 ml Prostat once daily to provide 1972 kcals, 102 grams of protein, and 1264 ml of free water. Provide free water flushes of 150 ml 4 times daily. Total daily water: 1864 ml.  Height: Ht Readings from Last 1 Encounters:  01/11/15 5\' 7"  (1.702 m)    Weight: Wt Readings from Last 1 Encounters:  01/12/15 216 lb 0.8 oz (98 kg)    Ideal Body Weight: 148 lb  (67.3 kg)  % Ideal Body Weight: 145%  Wt  Readings from Last 10 Encounters:  01/12/15 216 lb 0.8 oz (98 kg)  01/08/15 220 lb (99.791 kg)  01/02/15 217 lb 6 oz (98.6 kg)  12/29/14 233 lb 11.2 oz (106.006 kg)  12/17/14 236 lb 12.4 oz (107.4 kg)  05/03/10 290 lb (131.543 kg)    Usual Body Weight: unknown  % Usual Body Weight: -  BMI:  Body mass index is 33.83 kg/(m^2). obesity class I  Estimated Nutritional Needs: Kcal: 1850-2000 Protein: 100-115 grams Fluid: >/= 1.8L daily  Skin: intact  Diet Order: Diet NPO time specified  EDUCATION NEEDS: -No education needs identified at this time   Intake/Output Summary (Last 24 hours) at 01/12/15 0852 Last data filed at 01/12/15 0830  Gross per 24 hour  Intake 1834.25 ml  Output   1225 ml  Net 609.25 ml    Last BM: 2/29  Labs:   Recent Labs Lab 01/10/15 1630 01/12/15 0500  NA 151* 151*  K 4.9 3.3*  CL 109 114*  CO2 30 32  BUN 46* 28*  CREATININE 1.24 0.86  CALCIUM 9.6 8.0*  GLUCOSE 344* 120*    CBG (last 3)   Recent Labs  01/12/15 0006 01/12/15 0409 01/12/15 0746  GLUCAP 215* 101* 148*    Scheduled Meds: . therapeutic plasma exchange solution   Dialysis Q1 Hr x 5  . amiodarone  100 mg Per Tube Daily  . antiseptic oral rinse  7 mL Mouth Rinse q12n4p  . calcium gluconate IVPB  4 g  Intravenous Once  . chlorhexidine  15 mL Mouth Rinse BID  . citrate dextrose      . famotidine  40 mg Oral Daily  . feeding supplement (VITAL HIGH PROTEIN)  1,000 mL Per Tube Q24H  . free water  100 mL Per Tube 3 times per day  . heparin  1,000 Units Intracatheter Once  . insulin aspart  0-15 Units Subcutaneous 6 times per day  . potassium chloride  40 mEq Per Tube BID  . predniSONE  60 mg Oral Q breakfast  . pyridostigmine  60 mg Oral 4 times per day  . sodium chloride  500 mL Intravenous Once    Continuous Infusions: . sodium chloride 75 mL/hr at 01/12/15 0434  . citrate dextrose      Past Medical History  Diagnosis Date  . Atrial fibrillation, chronic      on Xarelto   . HTN (hypertension)   . CHF (congestive heart failure)   . Diabetes mellitus   . 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 Myasthenia gravis     Past Surgical History  Procedure Laterality Date  . Abdominal wall metastasis removal      Wynona Dove, MS Dietetic Intern Pager: [phone]

## 2015-01-13 ENCOUNTER — Telehealth: Payer: Self-pay | Admitting: *Deleted

## 2015-01-13 ENCOUNTER — Inpatient Hospital Stay (HOSPITAL_COMMUNITY): Payer: PPO

## 2015-01-13 DIAGNOSIS — R55 Syncope and collapse: Secondary | ICD-10-CM | POA: Diagnosis present

## 2015-01-13 LAB — GLUCOSE, CAPILLARY
GLUCOSE-CAPILLARY: 220 mg/dL — AB (ref 70–99)
GLUCOSE-CAPILLARY: 323 mg/dL — AB (ref 70–99)
Glucose-Capillary: 265 mg/dL — ABNORMAL HIGH (ref 70–99)
Glucose-Capillary: 297 mg/dL — ABNORMAL HIGH (ref 70–99)
Glucose-Capillary: 349 mg/dL — ABNORMAL HIGH (ref 70–99)
Glucose-Capillary: 341 mg/dL — ABNORMAL HIGH (ref 70–99)

## 2015-01-13 LAB — URINALYSIS, ROUTINE W REFLEX MICROSCOPIC
BILIRUBIN URINE: NEGATIVE
Glucose, UA: >1000 mg/dL — AB
Hgb urine dipstick: NEGATIVE
Ketones, ur: NEGATIVE mg/dL
Leukocytes, UA: NEGATIVE
Nitrite: NEGATIVE
PH: 5.5 (ref 5.0–8.0)
Protein, ur: NEGATIVE mg/dL
Specific Gravity, Urine: 1.029 (ref 1.005–1.030)
Urobilinogen, UA: 1 mg/dL (ref 0.0–1.0)

## 2015-01-13 LAB — PHOSPHORUS: PHOSPHORUS: 1.6 mg/dL — AB (ref 2.3–4.6)

## 2015-01-13 LAB — BASIC METABOLIC PANEL
Anion gap: 4 — ABNORMAL LOW (ref 5–15)
BUN: 23 mg/dL (ref 6–23)
CO2: 27 mmol/L (ref 19–32)
Calcium: 8.6 mg/dL (ref 8.4–10.5)
Chloride: 116 mmol/L — ABNORMAL HIGH (ref 96–112)
Creatinine, Ser: 0.88 mg/dL (ref 0.50–1.35)
GFR calc Af Amer: >90 mL/min (ref >90)
GFR calc non Af Amer: 83 mL/min — ABNORMAL LOW (ref >90)
GLUCOSE: 342 mg/dL — AB (ref 70–99)
POTASSIUM: 3.7 mmol/L (ref 3.5–5.1)
SODIUM: 147 mmol/L — AB (ref 135–145)

## 2015-01-13 LAB — MAGNESIUM: Magnesium: 2 mg/dL (ref 1.5–2.5)

## 2015-01-13 LAB — URINE MICROSCOPIC-ADD ON

## 2015-01-13 MED ORDER — INSULIN GLARGINE 100 UNIT/ML ~~LOC~~ SOLN
10.0000 [IU] | Freq: Every day | SUBCUTANEOUS | Status: DC
  Administered 2015-01-13 – 2015-01-15 (×3): 10 [IU] via SUBCUTANEOUS
  Filled 2015-01-13 (×5): qty 0.1

## 2015-01-13 MED ORDER — METRONIDAZOLE 500 MG PO TABS
500.0000 mg | ORAL_TABLET | Freq: Three times a day (TID) | ORAL | Status: DC
  Administered 2015-01-13 – 2015-01-17 (×14): 500 mg via ORAL
  Filled 2015-01-13 (×16): qty 1

## 2015-01-13 MED ORDER — FENTANYL CITRATE 0.05 MG/ML IJ SOLN
25.0000 ug | Freq: Once | INTRAMUSCULAR | Status: AC
  Administered 2015-01-13: 25 ug via INTRAVENOUS
  Filled 2015-01-13: qty 2

## 2015-01-13 MED ORDER — FENTANYL CITRATE 0.05 MG/ML IJ SOLN
25.0000 ug | INTRAMUSCULAR | Status: DC | PRN
  Administered 2015-01-13 – 2015-01-14 (×6): 25 ug via INTRAVENOUS
  Administered 2015-01-14 – 2015-01-21 (×38): 50 ug via INTRAVENOUS
  Administered 2015-01-22: 25 ug via INTRAVENOUS
  Filled 2015-01-13 (×46): qty 2

## 2015-01-13 MED ORDER — DIPHENHYDRAMINE HCL 25 MG PO CAPS: 25.0000 mg | ORAL_CAPSULE | Freq: Four times a day (QID) | ORAL | Status: DC | PRN

## 2015-01-13 MED ORDER — HEPARIN SODIUM (PORCINE) 1000 UNIT/ML IJ SOLN
1000.0000 [IU] | Freq: Once | INTRAMUSCULAR | Status: DC
  Filled 2015-01-13: qty 1

## 2015-01-13 MED ORDER — POTASSIUM PHOSPHATES 15 MMOLE/5ML IV SOLN
20.0000 mmol | Freq: Once | INTRAVENOUS | Status: AC
  Administered 2015-01-13: 20 mmol via INTRAVENOUS
  Filled 2015-01-13: qty 6.67

## 2015-01-13 NOTE — Progress Notes (Signed)
Pharmacy Consult for C. Difficile management consult- I Indication: Clostridium Difficile  Allergies  Allergen Reactions  . Ibuprofen Other (See Comments)    Interacts with another medication patient is taking    Patient Measurements: Height: 5\' 7"  (170.2 cm) Weight: 223 lb 5.2 oz (101.3 kg) IBW/kg (Calculated) : 66.1   Vital Signs: Temp: 97.6 F (36.4 C) (03/01 0349) Temp Source: Axillary (03/01 0349) BP: 105/57 mmHg (03/01 0409) Pulse Rate: 68 (03/01 0409) Intake/Output from previous day: 02/29 0701 - 03/01 0700 In: 2378.4 [I.V.:1178.8; NG/GT:959.6; IV Piggyback:100] Out: 375 [Urine:375] Intake/Output from this shift: Total I/O In: 1035 [I.V.:520; Other:60; NG/GT:455] Out: 175 [Urine:175]  Labs:  Recent Labs  01/10/15 1630 01/12/15 0500 01/12/15 1115 01/12/15 1800  WBC 17.7* 11.3*  --   --   HGB 14.5 12.9* 14.3  --   PLT 262 182  --   --   CREATININE 1.24 0.86 0.90 0.96   Estimated Creatinine Clearance: 76.6 mL/min (by C-G formula based on Cr of 0.96). No results for input(s): VANCOTROUGH, VANCOPEAK, VANCORANDOM, GENTTROUGH, GENTPEAK, GENTRANDOM, TOBRATROUGH, TOBRAPEAK, TOBRARND, AMIKACINPEAK, AMIKACINTROU, AMIKACIN in the last 72 hours.   Microbiology: Recent Results (from the past 720 hour(s))  MRSA PCR Screening     Status: None   Collection Time: 12/27/14  3:20 AM  Result Value Ref Range Status   MRSA by PCR NEGATIVE NEGATIVE Final    Comment:        The GeneXpert MRSA Assay (FDA approved for NASAL specimens only), is one component of a comprehensive MRSA colonization surveillance program. It is not intended to diagnose MRSA infection nor to guide or monitor treatment for MRSA infections.   Urine culture     Status: None   Collection Time: 12/30/14  4:24 PM  Result Value Ref Range Status   Specimen Description URINE, CLEAN CATCH  Final   Special Requests NONE  Final   Colony Count   Final    >=100,000 COLONIES/ML Performed at [facility]    Culture   Final    ESCHERICHIA COLI Performed at [facility]    Report Status 01/01/2015 FINAL  Final   Organism ID, Bacteria ESCHERICHIA COLI  Final      Susceptibility   Escherichia coli - MIC*    AMPICILLIN >=32 RESISTANT Resistant     CEFAZOLIN <=4 SENSITIVE Sensitive     CEFTRIAXONE <=1 SENSITIVE Sensitive     CIPROFLOXACIN <=0.25 SENSITIVE Sensitive     GENTAMICIN <=1 SENSITIVE Sensitive     LEVOFLOXACIN <=0.12 SENSITIVE Sensitive     NITROFURANTOIN 64 INTERMEDIATE Intermediate     TOBRAMYCIN <=1 SENSITIVE Sensitive     TRIMETH/SULFA >=320 RESISTANT Resistant     PIP/TAZO <=4 SENSITIVE Sensitive     * ESCHERICHIA COLI  MRSA PCR Screening     Status: None   Collection Time: 01/11/15 12:58 AM  Result Value Ref Range Status   MRSA by PCR NEGATIVE NEGATIVE Final    Comment:        The GeneXpert MRSA Assay (FDA approved for NASAL specimens only), is one component of a comprehensive MRSA colonization surveillance program. It is not intended to diagnose MRSA infection nor to guide or monitor treatment for MRSA infections.   Clostridium Difficile by PCR     Status: Abnormal   Collection Time: 01/12/15  3:01 PM  Result Value Ref Range Status   C difficile by pcr POSITIVE (A) NEGATIVE Final    Comment: CRITICAL RESULT CALLED TO, READ  BACK BY AND VERIFIED WITH: T. CRITE RN 17:00 01/12/15 ([email or web address])     Medical History: Past Medical History  Diagnosis Date  . Atrial fibrillation, chronic     on Xarelto   . HTN (hypertension)   . CHF (congestive heart failure)   . Diabetes mellitus   . 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 Myasthenia gravis     Medications:  Prescriptions prior to admission  Medication Sig Dispense Refill Last Dose  . amiodarone (PACERONE) 100 MG tablet Place 1 tablet (100 mg total) into feeding tube daily.   01/10/2015 at Unknown time  . antiseptic oral rinse (CPC /  CETYLPYRIDINIUM CHLORIDE 0.05%) 0.05 % LIQD solution 7 mLs by Mouth Rinse route 2 (two) times daily. 946 mL 1 01/10/2015 at Unknown time  . artificial tears (LACRILUBE) OINT ophthalmic ointment Place into both eyes every 4 (four) hours as needed for dry eyes.  0 01/09/2015 at Unknown time  . atorvastatin (LIPITOR) 40 MG tablet Place 1 tablet (40 mg total) into feeding tube daily.   01/09/2015 at Unknown time  . cephALEXin (KEFLEX) 250 MG/5ML suspension Place 5 mLs (250 mg total) into feeding tube every 8 (eight) hours. 35 mL 0 01/10/2015 at Unknown time  . famotidine (PEPCID) 40 MG/5ML suspension Take 5 mLs (40 mg total) by mouth daily. 150 mL 0 01/10/2015 at Unknown time  . insulin aspart (NOVOLOG FLEXPEN) 100 UNIT/ML FlexPen Inject 0-10 Units into the skin 4 (four) times daily. Use according to scale prior to tube feeds. 15 mL 1 01/10/2015 at Unknown time  . ipratropium (ATROVENT HFA) 17 MCG/ACT inhaler Inhale 1 puff into the lungs every 6 (six) hours as needed for wheezing.   01/10/2015 at Unknown time  . ipratropium (ATROVENT) 0.02 % nebulizer solution Inhale 2.5 mLs (0.5 mg total) into the lungs every 6 (six) hours as needed (wHEEZING.). 75 mL 12 01/10/2015 at Unknown time  . metoprolol tartrate (LOPRESSOR) 25 MG tablet Place 0.5 tablets (12.5 mg total) into feeding tube daily.   01/10/2015 at 0800  . nateglinide (STARLIX) 120 MG tablet Place 1 tablet (120 mg total) into feeding tube 3 (three) times daily. 90 tablet 1 01/10/2015 at Unknown time  . Nutritional Supplements (FEEDING SUPPLEMENT, JEVITY 1.2 CAL,) LIQD Place 390 mLs into feeding tube 4 (four) times daily -  with meals and at bedtime.  0 01/10/2015 at Unknown time  . potassium chloride 20 MEQ/15ML (10%) SOLN Place 15 mLs (20 mEq total) into feeding tube 3 (three) times daily. 3800 mL 0 01/10/2015 at Unknown time  . predniSONE (DELTASONE) 20 MG tablet Place 3 tablets (60 mg total) into feeding tube daily with breakfast. 150 tablet 1 01/10/2015 at  Unknown time  . pyridostigmine (MESTINON) 60 MG tablet 1.5 tablets (90 mg total) by Per NG tube route 4 (four) times daily. 135 tablet 1 01/10/2015 at Unknown time  . rivaroxaban (XARELTO) 20 MG TABS tablet Take 1 tablet (20 mg total) by mouth daily with supper. (Patient taking differently: Place 20 mg into feeding tube daily with supper. )   01/09/2015 at Unknown time  . chlorhexidine (PERIDEX) 0.12 % solution 15 mLs by Mouth Rinse route 2 (two) times daily. 120 mL 0 not started yet  . ondansetron (ZOFRAN) 4 MG tablet Take 1 tablet (4 mg total) by mouth every 6 (six) hours as needed for nausea. 20 tablet 0 Unk  . senna (SENOKOT) 8.6 MG TABS tablet  Place 1 tablet (8.6 mg total) into feeding tube daily as needed for mild constipation. (Patient not taking: Reported on 01/10/2015) 120 each 0 Not Taking at Unknown time  . Water For Irrigation, Sterile (FREE WATER) SOLN Place 150 mLs into feeding tube 4 (four) times daily - after meals and at bedtime.   Taking   Scheduled:  . acetylcysteine  3 mL Nebulization TID  . therapeutic plasma exchange solution   Dialysis Once in dialysis  . [START ON 01/14/2015] therapeutic plasma exchange solution   Dialysis Once in dialysis  . albuterol  2.5 mg Nebulization TID  . amiodarone  100 mg Per Tube Daily  . antiseptic oral rinse  7 mL Mouth Rinse q12n4p  . [START ON 01/14/2015] calcium gluconate IVPB  4 g Intravenous Once  . chlorhexidine  15 mL Mouth Rinse BID  . famotidine  40 mg Oral Daily  . feeding supplement (PRO-STAT SUGAR FREE 64)  30 mL Per Tube Daily  . insulin aspart  0-15 Units Subcutaneous 6 times per day  . piperacillin-tazobactam (ZOSYN)  IV  3.375 g Intravenous Q8H  . predniSONE  60 mg Oral Q breakfast  . pyridostigmine  60 mg Oral 4 times per day  . rivaroxaban  20 mg Per Tube Q supper  . sodium chloride  500 mL Intravenous Once    Assessment:   74 y.o male with first occurrence of C. Difficile.   WBC 11.3K , SCr 0.96   Plan: Metronidazole  500 mg po q8hr x 14 days.   Nicole Cella, RPh Clinical Pharmacist Pager: [phone] 01/13/2015,5:35 AM

## 2015-01-13 NOTE — Telephone Encounter (Signed)
Update: Home IVIG approved - I called [place] on 01/12/15 to let them know so he could be scheduled.  I touched base with the family - he was actually re-admitted to the hospital in is now in ICU.

## 2015-01-13 NOTE — Progress Notes (Signed)
NIF -20, VC 1.2 L/min

## 2015-01-13 NOTE — Progress Notes (Addendum)
Inpatient Diabetes Program Recommendations  AACE/ADA: New Consensus Statement on Inpatient Glycemic Control (2013)  Target Ranges:  Prepandial:   less than 140 mg/dL      Peak postprandial:   less than 180 mg/dL (1-2 hours)      Critically ill patients:  140 - 180 mg/dL   Results for BENICIO, MANNA (MRN [id number]) as of 01/13/2015 10:32  Ref. Range 01/12/2015 04:09 01/12/2015 07:46 01/12/2015 13:14 01/12/2015 15:45 01/12/2015 19:05 01/12/2015 23:26 01/13/2015 03:51 01/13/2015 07:37  Glucose-Capillary Latest Range: 70-99 mg/dL 101 (H) 148 (H) 269 (H) 339 (H) 292 (H) 265 (H) 297 (H) 220 (H)   Diabetes history: DM2 Outpatient Diabetes medications: Novolog 0-10 units QID Current orders for Inpatient glycemic control: Lantus 10 units daily, Novolog 0-15 units Q4H  Inpatient Diabetes Program Recommendations Insulin - Basal: Noted Lantus 10 units daily was ordered to start today at 12:00 pm. Insulin - Tube Feeding Coverage: Please consider ordering Novolog 4 units Q4H for tube feeding coverage. Insulin-Correction: Please consider discontinuing current regular glycemic control order set and order ICU Glycemic Control order set for tighter glycemic control.  Thanks, Barnie Alderman, RN, MSN, CCRN, CDE Diabetes Coordinator Inpatient Diabetes Program [phone] (Team Pager) [phone] ([place] office) [phone] [facility] office)

## 2015-01-13 NOTE — Progress Notes (Signed)
Subjective: Currently has no complaints.  Still feeling short as though he cannot get a good deep breath but breathing comfortably at rest.   Objective: Current vital signs: BP 105/56 mmHg  Pulse 90  Temp(Src) 98.6 F (37 C) (Axillary)  Resp 20  Ht 5\' 7"  (1.702 m)  Wt 101.3 kg (223 lb 5.2 oz)  BMI 34.97 kg/m2  SpO2 94% Vital signs in last 24 hours: Temp:  [97.4 F (36.3 C)-98.6 F (37 C)] 98.6 F (37 C) (03/01 0746) Pulse Rate:  [64-93] 90 (03/01 0900) Resp:  [9-45] 20 (03/01 0900) BP: (86-118)/(54-77) 105/56 mmHg (03/01 0900) SpO2:  [94 %-100 %] 94 % (03/01 0900) FiO2 (%):  [60 %] 60 % (03/01 0800) Weight:  [101.3 kg (223 lb 5.2 oz)] 101.3 kg (223 lb 5.2 oz) (03/01 0500)  Intake/Output from previous day: 02/29 0701 - 03/01 0700 In: 3128.4 [I.V.:1273.8; HW/EX:9371.6; IV Piggyback:150] Out: 575 [Urine:575] Intake/Output this shift: Total I/O In: 330 [I.V.:150; Other:50; NG/GT:130] Out: 75 [Urine:75] Nutritional status: Diet NPO time specified  Neurologic Exam: General: NAD Mental Status: Alert, oriented, thought content appropriate.  Speech fluent without evidence of aphasia.  Able to follow 3 step commands without difficulty. Cranial Nerves: II: Discs flat bilaterally; Visual fields grossly normal, pupils equal, round, reactive to light and accommodation III,IV, VI: ptosis not present, extra-ocular motions intact bilaterally V,VII: smile symmetric, facial light touch sensation normal bilaterally VIII: hearing normal bilaterally IX,X: gag reflex present XI: bilateral shoulder shrug XII: midline tongue extension without atrophy or fasciculations  Motor: Right : Upper extremity   4-/5    Left:     Upper extremity   4-/5  Lower extremity   4/5     Lower extremity   4/5 Tone and bulk:normal tone throughout; no atrophy noted Sensory: Pinprick and light touch intact throughout, bilaterally Deep Tendon Reflexes:  Right: Upper Extremity   Left: Upper extremity   biceps  (C-5 to C-6) 2/4   biceps (C-5 to C-6) 2/4 tricep (C7) 2/4    triceps (C7) 2/4 Brachioradialis (C6) 2/4  Brachioradialis (C6) 2/4  Lower Extremity Lower Extremity  quadriceps (L-2 to L-4) 1/4   quadriceps (L-2 to L-4) 1/4 Achilles (S1) 0/4   Achilles (S1) 0/4  Plantars: Right: downgoing   Left: downgoing     Cough strong, NIF/VC -20/ 1.2 L, Secretions none  Lab Results: Basic Metabolic Panel:  Recent Labs Lab 01/10/15 1630 01/12/15 0500 01/12/15 1115 01/12/15 1800 01/13/15 0500  NA 151* 151* 151* 148*  --   K 4.9 3.3* 4.9 3.8  --   CL 109 114* 112 118*  --   CO2 30 32  --  23  --   GLUCOSE 344* 120* 211* 320*  --   BUN 46* 28* 30* 30*  --   CREATININE 1.24 0.86 0.90 0.96  --   CALCIUM 9.6 8.0*  --  8.5  --   MG  --   --   --   --  2.0  PHOS  --   --   --   --  1.6*    Liver Function Tests: No results for input(s): AST, ALT, ALKPHOS, BILITOT, PROT, ALBUMIN in the last 168 hours. No results for input(s): LIPASE, AMYLASE in the last 168 hours. No results for input(s): AMMONIA in the last 168 hours.  CBC:  Recent Labs Lab 01/08/15 1608 01/10/15 1630 01/12/15 0500 01/12/15 1115  WBC 12.8* 17.7* 11.3*  --   NEUTROABS 11.8*  --   --   --  HGB 14.4 14.5 12.9* 14.3  HCT 42.9 44.5 39.1 42.0  MCV 93 95.3 94.4  --   PLT  --  262 182  --     Cardiac Enzymes: No results for input(s): CKTOTAL, CKMB, CKMBINDEX, TROPONINI in the last 168 hours.  Lipid Panel: No results for input(s): CHOL, TRIG, HDL, CHOLHDL, VLDL, LDLCALC in the last 168 hours.  CBG:  Recent Labs Lab 01/12/15 1545 01/12/15 1905 01/12/15 2326 01/13/15 0351 01/13/15 0737  GLUCAP 339* 292* 265* 297* 220*    Microbiology: Results for orders placed or performed during the hospital encounter of 01/10/15  MRSA PCR Screening     Status: None   Collection Time: 01/11/15 12:58 AM  Result Value Ref Range Status   MRSA by PCR NEGATIVE NEGATIVE Final    Comment:        The GeneXpert MRSA Assay  (FDA approved for NASAL specimens only), is one component of a comprehensive MRSA colonization surveillance program. It is not intended to diagnose MRSA infection nor to guide or monitor treatment for MRSA infections.   Clostridium Difficile by PCR     Status: Abnormal   Collection Time: 01/12/15  3:01 PM  Result Value Ref Range Status   C difficile by pcr POSITIVE (A) NEGATIVE Final    Comment: CRITICAL RESULT CALLED TO, READ BACK BY AND VERIFIED WITH: T. CRITE RN 17:00 01/12/15 ([email or web address])     Coagulation Studies: No results for input(s): LABPROT, INR in the last 72 hours.  Imaging: Dg Chest Port 1 View  01/12/2015   CLINICAL DATA:  Acute respiratory failure, shortness of breath, history of CHF myasthenia gravis, bladder malignancy.  EXAM: PORTABLE CHEST - 1 VIEW  COMPARISON:  Portable chest x-ray of January 11, 2015  FINDINGS: The lungs are reasonably well inflated. There is bibasilar atelectasis unchanged. The cardiac silhouette is top-normal in size. The pulmonary vascularity is normal. The Port-A-Cath appliance tip projects over the junction of the middle and distal thirds of the SVC. The right internal jugular dialysis catheter tip projects over the midportion of the SVC.  IMPRESSION: There has not been significant interval change in the appearance of the chest since yesterday's study. There remains bibasilar atelectasis and possible small bilateral pleural effusions.   Electronically Signed   By: David  Martinique   On: 01/12/2015 07:14   Dg Chest Port 1 View  01/11/2015   CLINICAL DATA:  Acute respiratory failure. Hemodialysis catheter insertion.  EXAM: PORTABLE CHEST - 1 VIEW  COMPARISON:  01/11/2015  FINDINGS: Right dialysis catheter has been placed with the tip in the SVC. Left Port-A-Cath is unchanged. No pneumothorax. Mild cardiomegaly. Left basilar atelectasis. Minimal right base atelectasis. No overt edema.  IMPRESSION: Right dialysis catheter placement with the tip in the SVC.  No pneumothorax.  Bibasilar atelectasis, left greater than right.   Electronically Signed   By: Rolm Baptise M.D.   On: 01/11/2015 15:15    Medications:  Scheduled: . acetylcysteine  3 mL Nebulization TID  . therapeutic plasma exchange solution   Dialysis Once in dialysis  . [START ON 01/14/2015] therapeutic plasma exchange solution   Dialysis Once in dialysis  . albuterol  2.5 mg Nebulization TID  . amiodarone  100 mg Per Tube Daily  . antiseptic oral rinse  7 mL Mouth Rinse q12n4p  . [START ON 01/14/2015] calcium gluconate IVPB  4 g Intravenous Once  . chlorhexidine  15 mL Mouth Rinse BID  . famotidine  40 mg Oral Daily  .  feeding supplement (PRO-STAT SUGAR FREE 64)  30 mL Per Tube Daily  . insulin aspart  0-15 Units Subcutaneous 6 times per day  . metroNIDAZOLE  500 mg Oral 3 times per day  . piperacillin-tazobactam (ZOSYN)  IV  3.375 g Intravenous Q8H  . predniSONE  60 mg Oral Q breakfast  . pyridostigmine  60 mg Oral 4 times per day  . rivaroxaban  20 mg Per Tube Q supper  . sodium chloride  500 mL Intravenous Once    Assessment/Plan: 74 yo M with SOB in the setting of myasthenia gravis and now found to be C-diff positive.    Etta Quill PA-C [facility] [phone]  01/13/2015, 9:28 AM   I have seen and evaluated the patient. I have reviewed the above note and made appropriate changes. MG exacerbation, received PLEX 1/5 yesterday, for tx again tomorrow. QOD for total of 5 treatments.   1) Continue prednisone 60mg  daily 2) continue mestinon.   Roland Rack, MD [facility] [phone]  If 7pm- 7am, please page neurology on call as listed in [place].

## 2015-01-13 NOTE — Progress Notes (Signed)
eLink Physician-Brief Progress Note Patient Name: Craig [name] DOB: [birth year] MRN: [id number]   Date of Service  01/13/2015  HPI/Events of Note  Patient with ongoing issues of pain not relived by tylenol C Diff positive  eICU Interventions  Plan: Fentanyl prn C Diff treatment per pharmacy     Intervention Category Intermediate Interventions: Pain - evaluation and management;Diagnostic test evaluation  [place] Code 01/13/2015, 5:26 AM

## 2015-01-13 NOTE — Progress Notes (Signed)
PULMONARY / CRITICAL CARE MEDICINE   Name: Craig [name] MRN: [id number] DOB: 1941-08-03    ADMISSION DATE:  01/10/2015 CONSULTATION DATE:  01/11/15  REFERRING MD :  Dr. Doy Mince   CHIEF COMPLAINT:  Respiratory Failure  INITIAL PRESENTATION: [name], never smoker, with PMH of MG with recent flare and prolonged admission to [facility] who was admitted on 2/27 with worsening SOB, fatigue and low oxygen saturations.  Recently discharged 2/19 from CIR after MG exacerbation admit that required intubation / PLEX rx.    STUDIES:  2/28  CXR >> low lung volumes, L basilar atx   SIGNIFICANT EVENTS: 2/27  Admit with worsening SOB, fatigue and low O2 saturations.  NIF -30, -22, -20.  VC 450, 700, 610 ml 2/28  PCCM consulted for evaluation of worsening respiratory status.  NIF -40, VC 1.6L 2/29 >>rt base atx  SUBJECTIVE: cycling bipap  VITAL SIGNS: Temp:  [97.4 F (36.3 C)-98.6 F (37 C)] 98.6 F (37 C) (03/01 0746) Pulse Rate:  [64-93] 90 (03/01 0900) Resp:  [9-45] 20 (03/01 0900) BP: (86-118)/(54-77) 105/56 mmHg (03/01 0900) SpO2:  [94 %-100 %] 94 % (03/01 0900) FiO2 (%):  [60 %] 60 % (03/01 0800) Weight:  [101.3 kg (223 lb 5.2 oz)] 101.3 kg (223 lb 5.2 oz) (03/01 0500)   HEMODYNAMICS:     VENTILATOR SETTINGS: Vent Mode:  [-]  FiO2 (%):  [60 %] 60 %   INTAKE / OUTPUT:  Intake/Output Summary (Last 24 hours) at 01/13/15 0931 Last data filed at 01/13/15 0900  Gross per 24 hour  Intake 3143.42 ml  Output    450 ml  Net 2693.42 ml    PHYSICAL EXAMINATION: General:  Chronically ill in NAD Neuro:  Generalized weakness, AAOx4 HEENT: jvd wnl Cardiovascular:  s1s2 rrr, no m/r/g Lungs:  Reduced bs bibasilar Abdomen:  Obese, soft, NT, PEG c/d/i Musculoskeletal:  No acute deformities, generalized weakness Skin:  Warm/dry, no edema  LABS:  CBC  Recent Labs Lab 01/08/15 1608 01/10/15 1630 01/12/15 0500 01/12/15 1115  WBC 12.8* 17.7* 11.3*  --   HGB 14.4 14.5 12.9* 14.3  HCT  42.9 44.5 39.1 42.0  PLT  --  262 182  --    Coag's No results for input(s): APTT, INR in the last 168 hours.   BMET  Recent Labs Lab 01/10/15 1630 01/12/15 0500 01/12/15 1115 01/12/15 1800  NA 151* 151* 151* 148*  K 4.9 3.3* 4.9 3.8  CL 109 114* 112 118*  CO2 30 32  --  23  BUN 46* 28* 30* 30*  CREATININE 1.24 0.86 0.90 0.96  GLUCOSE 344* 120* 211* 320*   Electrolytes  Recent Labs Lab 01/10/15 1630 01/12/15 0500 01/12/15 1800 01/13/15 0500  CALCIUM 9.6 8.0* 8.5  --   MG  --   --   --  2.0  PHOS  --   --   --  1.6*   Sepsis Markers No results for input(s): LATICACIDVEN, PROCALCITON, O2SATVEN in the last 168 hours.   ABG  Recent Labs Lab 01/12/15 1029  PHART 7.469*  PCO2ART 39.1  PO2ART 91.0     Liver Enzymes No results for input(s): AST, ALT, ALKPHOS, BILITOT, ALBUMIN in the last 168 hours.   Cardiac Enzymes No results for input(s): TROPONINI, PROBNP in the last 168 hours.   Glucose  Recent Labs Lab 01/12/15 1314 01/12/15 1545 01/12/15 1905 01/12/15 2326 01/13/15 0351 01/13/15 0737  GLUCAP 269* 339* 292* 265* 297* 220*    Imaging  Dg Chest Port 1 View  01/12/2015   CLINICAL DATA:  Acute respiratory failure, shortness of breath, history of CHF myasthenia gravis, bladder malignancy.  EXAM: PORTABLE CHEST - 1 VIEW  COMPARISON:  Portable chest x-ray of January 11, 2015  FINDINGS: The lungs are reasonably well inflated. There is bibasilar atelectasis unchanged. The cardiac silhouette is top-normal in size. The pulmonary vascularity is normal. The Port-A-Cath appliance tip projects over the junction of the middle and distal thirds of the SVC. The right internal jugular dialysis catheter tip projects over the midportion of the SVC.  IMPRESSION: There has not been significant interval change in the appearance of the chest since yesterday's study. There remains bibasilar atelectasis and possible small bilateral pleural effusions.   Electronically Signed    By: David  Martinique   On: 01/12/2015 07:14     ASSESSMENT / PLAN:  NEUROLOGIC A:   Myasthenia Gravis with Acute Exacerbation  Bibasilar atx P:   RASS goal: 0 Avoid benzo PT as able  Neurology following PLEX 2/29 >>  S/p IVIG 2/28, repeat plasmaphoresis in am  Activity as tolerated, upright position Daily NIf, VC (-20, 1.2 lit)  PULMONARY OETT A: Acute Respiratory Failure - in setting of MG exacerbation.  Actually appears stronger than prior admission.   OSA - on bipap 18/8 at baseline Rt base atx P:   Cycle on BiPAP - increase cycle 4 on 4 off, keep schedule as does decline after 4 hrs Pulmonary hygiene:  Mobilize, chest vest  Aspiration precautions mucmoysts  X 2 more doses, obtain abg With atx, increase EPAP 10, ipap 18, repeat pcxr now Chest pt  CARDIOVASCULAR L CW Port >> A:  Chronic Atrial Fibrillation - on Xarelto at baseline, held on admission  HTN HLD P:  Xarelto  Continue amiodarone PRN lopressor for HR > 115 or SBP > 170  RENAL A:   Hypophos P:   d5w remain, await am na await k, na to dose phos replacement Chem in m   GASTROINTESTINAL / GU Chronic PEG >>  A:   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 P:   2/29 zosyn>>> 2/29 flagyl>>> 2/29 sputum>>>  If stool increased, change to oral vanc pcxr now  ENDOCRINE A:   DM, uncontrolled  P:   SSI  Add lantus Still needs the d5w  FAMILY  - Updates: patient and family updated at bedside. Will trial bipap in  the hopes to avoid intubation.  He does appear stronger this admission but he may worsen with therapy.  They understand that he may require intubation if he were to decline further.    - Inter-disciplinary family meet or Palliative Care meeting due by: 3/6   Ccm time 30 min   Lavon Paganini. Titus Mould, MD, [place] Pgr: [facility]

## 2015-01-13 NOTE — Progress Notes (Signed)
eLink Physician-Brief Progress Note Patient Name: Craig [name] DOB: May 06, 1941 MRN: [id number]   Date of Service  01/13/2015  HPI/Events of Note  Patient c/o of 10/10 neck pain un-relieved by tylenol  eICU Interventions  Plan: 25 mcg fentanyl IV times one     Intervention Category Intermediate Interventions: Pain - evaluation and management  Teron Blais 01/13/2015, 2:08 AM

## 2015-01-13 NOTE — Progress Notes (Signed)
Patient was active with with [facility] care management prior to admission. [place] will continue to follow for progress and D/C planning needs.  Of note, [facility] Care Management does not interfere with or replace any services arranged for the patient by the inpatient care management team.  Please contact Natividad Brood, RN, BSN, [facility] Liaison Nurse at [phone].

## 2015-01-14 ENCOUNTER — Inpatient Hospital Stay (HOSPITAL_COMMUNITY): Payer: PPO

## 2015-01-14 LAB — BASIC METABOLIC PANEL
ANION GAP: 6 (ref 5–15)
BUN: 18 mg/dL (ref 6–23)
CO2: 29 mmol/L (ref 19–32)
Calcium: 8.7 mg/dL (ref 8.4–10.5)
Chloride: 110 mmol/L (ref 96–112)
Creatinine, Ser: 0.75 mg/dL (ref 0.50–1.35)
GFR calc non Af Amer: 88 mL/min — ABNORMAL LOW (ref >90)
Glucose, Bld: 252 mg/dL — ABNORMAL HIGH (ref 70–99)
POTASSIUM: 3 mmol/L — AB (ref 3.5–5.1)
Sodium: 145 mmol/L (ref 135–145)

## 2015-01-14 LAB — GLUCOSE, CAPILLARY
GLUCOSE-CAPILLARY: 221 mg/dL — AB (ref 70–99)
GLUCOSE-CAPILLARY: 237 mg/dL — AB (ref 70–99)
GLUCOSE-CAPILLARY: 241 mg/dL — AB (ref 70–99)
GLUCOSE-CAPILLARY: 267 mg/dL — AB (ref 70–99)
GLUCOSE-CAPILLARY: 292 mg/dL — AB (ref 70–99)
Glucose-Capillary: 302 mg/dL — ABNORMAL HIGH (ref 70–99)
Glucose-Capillary: 272 mg/dL — ABNORMAL HIGH (ref 70–99)

## 2015-01-14 LAB — POCT I-STAT, CHEM 8
BUN: 20 mg/dL (ref 6–23)
CREATININE: 0.8 mg/dL (ref 0.50–1.35)
Calcium, Ion: 1.29 mmol/L (ref 1.13–1.30)
Chloride: 103 mmol/L (ref 96–112)
GLUCOSE: 319 mg/dL — AB (ref 70–99)
HCT: 38 % — ABNORMAL LOW (ref 39.0–52.0)
HEMOGLOBIN: 12.9 g/dL — AB (ref 13.0–17.0)
POTASSIUM: 3.9 mmol/L (ref 3.5–5.1)
SODIUM: 145 mmol/L (ref 135–145)
TCO2: 26 mmol/L (ref 0–100)

## 2015-01-14 LAB — CBC WITH DIFFERENTIAL/PLATELET
BASOS ABS: 0 10*3/uL (ref 0.0–0.1)
Basophils Relative: 0 % (ref 0–1)
EOS ABS: 0 10*3/uL (ref 0.0–0.7)
EOS PCT: 0 % (ref 0–5)
HEMATOCRIT: 37.5 % — AB (ref 39.0–52.0)
Hemoglobin: 12.2 g/dL — ABNORMAL LOW (ref 13.0–17.0)
LYMPHS PCT: 16 % (ref 12–46)
Lymphs Abs: 1.7 10*3/uL (ref 0.7–4.0)
MCH: 30.8 pg (ref 26.0–34.0)
MCHC: 32.5 g/dL (ref 30.0–36.0)
MCV: 94.7 fL (ref 78.0–100.0)
Monocytes Absolute: 0.6 10*3/uL (ref 0.1–1.0)
Monocytes Relative: 5 % (ref 3–12)
NEUTROS ABS: 8.4 10*3/uL — AB (ref 1.7–7.7)
Neutrophils Relative %: 79 % — ABNORMAL HIGH (ref 43–77)
Platelets: 154 10*3/uL (ref 150–400)
RBC: 3.96 MIL/uL — ABNORMAL LOW (ref 4.22–5.81)
RDW: 14.5 % (ref 11.5–15.5)
WBC: 10.7 10*3/uL — AB (ref 4.0–10.5)

## 2015-01-14 LAB — URINE CULTURE
COLONY COUNT: NO GROWTH
CULTURE: NO GROWTH

## 2015-01-14 MED ORDER — SODIUM CHLORIDE 0.9 % IV SOLN
4.0000 g | Freq: Once | INTRAVENOUS | Status: AC
  Filled 2015-01-14: qty 40

## 2015-01-14 MED ORDER — ACD FORMULA A 0.73-2.45-2.2 GM/100ML VI SOLN
500.0000 mL | Status: DC
  Administered 2015-01-14 (×2): 500 mL via INTRAVENOUS
  Filled 2015-01-14: qty 500

## 2015-01-14 MED ORDER — ACETAMINOPHEN 325 MG PO TABS: 650.0000 mg | ORAL_TABLET | ORAL | Status: DC | PRN

## 2015-01-14 MED ORDER — CALCIUM CARBONATE ANTACID 500 MG PO CHEW
2.0000 | CHEWABLE_TABLET | ORAL | Status: AC
  Filled 2015-01-14 (×2): qty 2

## 2015-01-14 MED ORDER — SODIUM CHLORIDE 0.9 % IV SOLN
INTRAVENOUS | Status: AC
  Administered 2015-01-14 (×2): via INTRAVENOUS_CENTRAL
  Filled 2015-01-14 (×4): qty 200

## 2015-01-14 MED ORDER — ACD FORMULA A 0.73-2.45-2.2 GM/100ML VI SOLN
Status: AC
  Filled 2015-01-14: qty 1000

## 2015-01-14 MED ORDER — INSULIN ASPART 100 UNIT/ML ~~LOC~~ SOLN
4.0000 [IU] | SUBCUTANEOUS | Status: DC
  Administered 2015-01-14 – 2015-01-22 (×48): 4 [IU] via SUBCUTANEOUS

## 2015-01-14 MED ORDER — HEPARIN SODIUM (PORCINE) 1000 UNIT/ML IJ SOLN: 1000.0000 [IU] | Freq: Once | INTRAMUSCULAR | Status: DC

## 2015-01-14 MED ORDER — DIPHENHYDRAMINE HCL 25 MG PO CAPS: 25.0000 mg | ORAL_CAPSULE | Freq: Four times a day (QID) | ORAL | Status: DC | PRN

## 2015-01-14 MED ORDER — POTASSIUM CHLORIDE 20 MEQ/15ML (10%) PO SOLN
30.0000 meq | ORAL | Status: AC
  Administered 2015-01-14 (×2): 30 meq
  Filled 2015-01-14 (×3): qty 30

## 2015-01-14 MED ORDER — CEFTRIAXONE SODIUM IN DEXTROSE 40 MG/ML IV SOLN
2.0000 g | INTRAVENOUS | Status: DC
  Administered 2015-01-14 – 2015-01-17 (×4): 2 g via INTRAVENOUS
  Filled 2015-01-14 (×4): qty 50

## 2015-01-14 MED ORDER — MUPIROCIN 2 % EX OINT
TOPICAL_OINTMENT | Freq: Every day | CUTANEOUS | Status: DC
  Administered 2015-01-14 – 2015-01-21 (×7): via TOPICAL
  Filled 2015-01-14 (×3): qty 22

## 2015-01-14 NOTE — Progress Notes (Signed)
Inpatient Diabetes Program Recommendations  AACE/ADA: New Consensus Statement on Inpatient Glycemic Control (2013)  Target Ranges:  Prepandial:   less than 140 mg/dL      Peak postprandial:   less than 180 mg/dL (1-2 hours)      Critically ill patients:  140 - 180 mg/dL   Results for Craig [name], Craig [name] (MRN [id number]) as of 01/14/2015 09:07  Ref. Range 01/13/2015 15:42 01/13/2015 19:33 01/13/2015 23:56 01/14/2015 04:17 01/14/2015 08:26  Glucose-Capillary Latest Range: 70-99 mg/dL 341 (H) 323 (H) 292 (H) 267 (H) 221 (H)    Diabetes history: DM2 Outpatient Diabetes medications: Novolog 0-10 units 4x/day, Starlix 120mg  TID Current orders for Inpatient glycemic control: Lantus 10 units Daily, Novolog 0-15 units Q4hrs, Novolog 4 units Q4hrs tube feed coverage.  Inpatient Diabetes Program Recommendations Insulin - Basal: Glucose levels in the 200-300's. If the desire is to keep the patient on current glycemic control order set instead of ICU Glycemic Control order set and starting IV insulin, please consider increasing basal dose to Lantus 15-20 units Q24hrs. Insulin - Tube Feed Coverage: Noted starting tube feed coverage at Novolog 4 units Q4hrs (hold if tube feeds are held or stopped for any reason).  Thanks,  Tama Headings RN, MSN, [facility] Inpatient Diabetes Coordinator Team Pager [phone]

## 2015-01-14 NOTE — Progress Notes (Signed)
Pt. Performed -25 on the NIF and .85L on the vital capacity.

## 2015-01-14 NOTE — Progress Notes (Signed)
[facility] ADULT ICU REPLACEMENT PROTOCOL FOR AM LAB REPLACEMENT ONLY  The patient does apply for the [facility] Adult ICU Electrolyte Replacment Protocol based on the criteria listed below:   1. Is GFR >/= 40 ml/min? Yes.    Patient's GFR today is 88 2. Is urine output >/= 0.5 ml/kg/hr for the last 6 hours? Yes.   Patient's UOP is .9 ml/kg/hr 3. Is BUN < 60 mg/dL? Yes.    Patient's BUN today is 18 4. Abnormal electrolyte K 3.0 5. Ordered repletion with: per protocol 6. If a panic level lab has been reported, has the CCM MD in charge been notified? Yes.  .   Physician:  Chari Manning [facility] 01/14/2015 6:02 AM

## 2015-01-14 NOTE — Progress Notes (Signed)
PULMONARY / CRITICAL CARE MEDICINE   Name: Craig [name] MRN: [id number] DOB: [birth year]    ADMISSION DATE:  01/10/2015 CONSULTATION DATE:  01/11/15  REFERRING MD :  Dr. Doy Mince   CHIEF COMPLAINT:  Respiratory Failure  INITIAL PRESENTATION: [name], never smoker, with PMH of MG with recent flare and prolonged admission to [facility] who was admitted on 2/27 with worsening SOB, fatigue and low oxygen saturations.  Recently discharged 2/19 from CIR after MG exacerbation admit that required intubation / PLEX rx.    STUDIES:  2/28  CXR >> low lung volumes, L basilar atx   SIGNIFICANT EVENTS: 2/27  Admit with worsening SOB, fatigue and low O2 saturations.  NIF -30, -22, -20.  VC 450, 700, 610 ml 2/28  PCCM consulted for evaluation of worsening respiratory status.  NIF -40, VC 1.6L 2/29  R basilar atx  SUBJECTIVE: RT reports stronger cough this am, NIF -38, VC 1L with good effort.  Pt reports feeling stronger / better this am  VITAL SIGNS: Temp:  [97.3 F (36.3 C)-98.4 F (36.9 C)] 97.6 F (36.4 C) (03/02 0828) Pulse Rate:  [52-114] 72 (03/02 0600) Resp:  [16-45] 20 (03/02 0600) BP: (97-109)/(49-75) 103/57 mmHg (03/02 0600) SpO2:  [91 %-100 %] 95 % (03/02 0600) FiO2 (%):  [60 %] 60 % (03/02 0300) Weight:  [220 lb 14.4 oz (100.2 kg)] 220 lb 14.4 oz (100.2 kg) (03/02 0500)   HEMODYNAMICS:     VENTILATOR SETTINGS: Vent Mode:  [-]  FiO2 (%):  [60 %] 60 %   INTAKE / OUTPUT:  Intake/Output Summary (Last 24 hours) at 01/14/15 0835 Last data filed at 01/14/15 0500  Gross per 24 hour  Intake   2468 ml  Output   1165 ml  Net   1303 ml    PHYSICAL EXAMINATION: General:  Chronically ill in NAD Neuro:  Generalized weakness, AAOx4 HEENT: jvd wnl Cardiovascular:  s1s2 rrr, no m/r/g Lungs:  Reduced bs bibasilar Abdomen:  Obese, soft, NT, PEG c/d/i Musculoskeletal:  No acute deformities, generalized weakness Skin:  Warm/dry, no edema.  Bridge of nose with small stage 2 (present on admit  - was a dry scab, now open with moist wound bed) pressure ulcer from bipap.    LABS:  CBC  Recent Labs Lab 01/10/15 1630 01/12/15 0500 01/12/15 1115 01/14/15 0500  WBC 17.7* 11.3*  --  10.7*  HGB 14.5 12.9* 14.3 12.2*  HCT 44.5 39.1 42.0 37.5*  PLT 262 182  --  154   BMET  Recent Labs Lab 01/12/15 1800 01/13/15 1030 01/14/15 0500  NA 148* 147* 145  K 3.8 3.7 3.0*  CL 118* 116* 110  CO2 23 27 29   BUN 30* 23 18  CREATININE 0.96 0.88 0.75  GLUCOSE 320* 342* 252*   Electrolytes  Recent Labs Lab 01/12/15 1800 01/13/15 0500 01/13/15 1030 01/14/15 0500  CALCIUM 8.5  --  8.6 8.7  MG  --  2.0  --   --   PHOS  --  1.6*  --   --     ABG  Recent Labs Lab 01/12/15 1029  PHART 7.469*  PCO2ART 39.1  PO2ART 91.0    Glucose  Recent Labs Lab 01/13/15 0737 01/13/15 1202 01/13/15 1542 01/13/15 1933 01/13/15 2356 01/14/15 0417  GLUCAP 220* 349* 341* 323* 292* 267*    Imaging Dg Chest Port 1 View  01/13/2015   CLINICAL DATA:  Collapse  EXAM: PORTABLE CHEST - 1 VIEW  COMPARISON:  01/12/2015  FINDINGS: Cardiac shadow is stable. A left chest wall port and right temporary dialysis catheter are again seen. Bibasilar atelectatic changes are noted. No pneumothorax is seen. No sizable effusion is noted.  IMPRESSION: Bibasilar atelectatic changes.  These results will be called to the ordering clinician or representative by the Radiologist Assistant, and communication documented in the PACS or zVision Dashboard.   Electronically Signed   By: Inez Catalina M.D.   On: 01/13/2015 10:00     ASSESSMENT / PLAN:  NEUROLOGIC A:   Myasthenia Gravis with Acute Exacerbation  P:   Avoid benzo / sedating meds  PT as able  Neurology following PLEX initiated 2/29 >>   S/p IVIG 2/28 Activity as tolerated, upright position Daily NIf, VC , some improvement noted and clinically  PULMONARY OETT A: Acute Respiratory Failure - in setting of MG exacerbation.  Actually appears stronger  than prior admission.   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  Nosocomial exposure last 90 days C-diff pos (only 1 smear today), FP?Marland Kitchen  Hx recent positive at rehab.   Nasal Stage II Pressure Ulcer - present on admit, was dry scab, open with increase bipap use   P:   Zosyn 2/29 >>3/2 Ceftriaxone 3/2>>> Flagyl 2/29 >>  2/29 sputum >> 2/29 C-diff >> positive  If stool increased, change to oral vanc Trend CXR, basilar atx WOC consult for nose Narrow off zosyn  ENDOCRINE A:   DM, uncontrolled  P:   SSI  Add 4 units insulin Q4 for TF coverage Lantus Consider d/c D5 in am 3/3  FAMILY  - Updates: patient and family updated at bedside. Will trial bipap in the hopes to avoid intubation.  He does appear stronger this admission but he may worsen with therapy.  They understand that he may require intubation if he were to decline further.    - Inter-disciplinary family  meet or Palliative Care meeting due by: 3/6   [name], NP-C [place] Pgr: [phone] or [phone]    STAFF NOTE: I, Merrie Roof, MD FACP have personally reviewed patient's available data, including medical history, events of note, physical examination and test results as part of my evaluation. I have discussed with resident/NP and other care providers such as pharmacist, RN and RRT. In addition, I personally evaluated patient and elicited key findings of: NIF, VC  improved, pcxr better lung volumes, nose wound a concern, limit NIMV further 2 hr on 4-6 ogg, then consider NO NIMV in am , naorrw abx, pcxr in am , soon with nose wound, may have to abort NIMV, wife updated by me The patient is critically ill with multiple organ systems failure and requires high complexity decision making for assessment and support, frequent evaluation and titration of therapies, application of advanced monitoring technologies and extensive interpretation of multiple databases.   Critical Care Time devoted to patient care services described in this note is30 Minutes. This time reflects time of care of this signee: Merrie Roof, MD FACP. This critical care time does not reflect procedure time, or teaching time or supervisory time of PA/NP/Med student/Med Resident etc but could involve care discussion time. Rest per NP/medical resident whose note is outlined above and that I agree with   Lavon Paganini. Titus Mould, MD, [place] Pgr: [facility] 01/14/2015 12:24 PM

## 2015-01-14 NOTE — Progress Notes (Signed)
TPE #2/5 complete without issue. Tolerated well. Report given to primary RN. Gave 2g calcium gluconate during exchange.

## 2015-01-14 NOTE — Progress Notes (Signed)
Subjective: Patient feeling slightly better but wife notes he is coughing more.  Still slightly SOB but no change from yesterday.  Objective: Current vital signs: BP 103/57 mmHg  Pulse 72  Temp(Src) 97.6 F (36.4 C) (Oral)  Resp 20  Ht 5\' 7"  (1.702 m)  Wt 100.2 kg (220 lb 14.4 oz)  BMI 34.59 kg/m2  SpO2 95% Vital signs in last 24 hours: Temp:  [97.3 F (36.3 C)-98.4 F (36.9 C)] 97.6 F (36.4 C) (03/02 0828) Pulse Rate:  [52-114] 72 (03/02 0600) Resp:  [16-45] 20 (03/02 0600) BP: (97-109)/(49-75) 103/57 mmHg (03/02 0600) SpO2:  [91 %-100 %] 95 % (03/02 0600) FiO2 (%):  [60 %] 60 % (03/02 0300) Weight:  [100.2 kg (220 lb 14.4 oz)] 100.2 kg (220 lb 14.4 oz) (03/02 0500)  Intake/Output from previous day: 03/01 0701 - 03/02 0700 In: 2658 [I.V.:703.8; NG/GT:1472.3; IV Piggyback:402] Out: 1240 [Urine:1240] Intake/Output this shift:   Nutritional status: Diet NPO time specified  Neurologic Exam: Mental Status: Alert, oriented, thought content appropriate. Speech fluent without evidence of aphasia. Able to follow 3 step commands without difficulty. Cranial Nerves: II:  Visual fields grossly normal, pupils equal, round, reactive to light and accommodation III,IV, VI: ptosis not present, extra-ocular motions intact bilaterally V,VII: smile symmetric, facial light touch sensation normal bilaterally VIII: hearing normal bilaterally IX,X: gag reflex present XI: bilateral shoulder shrug XII: midline tongue extension without atrophy or fasciculations  Motor: Right :Upper extremity 4-/5Left: Upper extremity 4-/5 Lower extremity 4/5Lower extremity 4/5 Tone and bulk:normal tone throughout; no atrophy noted Sensory: Pinprick and light touch intact throughout, bilaterally Deep Tendon Reflexes:  Right: Upper Extremity Left: Upper extremity    biceps (C-5 to C-6) 2/4 biceps (C-5 to C-6) 2/4 tricep (C7) 2/4triceps (C7) 2/4 Brachioradialis (C6) 2/4Brachioradialis (C6) 2/4  Lower Extremity Lower Extremity  quadriceps (L-2 to L-4) 1/4 quadriceps (L-2 to L-4) 1/4 Achilles (S1) 0/4Achilles (S1) 0/4  Plantars: Right: downgoingLeft: downgoing  Cough strong, NIF/VC improved to -38/1 from  -20/ 1.2 L on (01-15-2015), Secretions none, productive cough today  Lab Results: Basic Metabolic Panel:  Recent Labs Lab 01/10/15 1630 01/12/15 0500 01/12/15 1115 01/12/15 1800 01/13/15 0500 01/13/15 1030 01/14/15 0500  NA 151* 151* 151* 148*  --  147* 145  K 4.9 3.3* 4.9 3.8  --  3.7 3.0*  CL 109 114* 112 118*  --  116* 110  CO2 30 32  --  23  --  27 29  GLUCOSE 344* 120* 211* 320*  --  342* 252*  BUN 46* 28* 30* 30*  --  23 18  CREATININE 1.24 0.86 0.90 0.96  --  0.88 0.75  CALCIUM 9.6 8.0*  --  8.5  --  8.6 8.7  MG  --   --   --   --  2.0  --   --   PHOS  --   --   --   --  1.6*  --   --     Liver Function Tests: No results for input(s): AST, ALT, ALKPHOS, BILITOT, PROT, ALBUMIN in the last 168 hours. No results for input(s): LIPASE, AMYLASE in the last 168 hours. No results for input(s): AMMONIA in the last 168 hours.  CBC:  Recent Labs Lab 01/08/15 1608 01/10/15 1630 01/12/15 0500 01/12/15 1115 01/14/15 0500  WBC 12.8* 17.7* 11.3*  --  10.7*  NEUTROABS 11.8*  --   --   --  8.4*  HGB 14.4 14.5 12.9* 14.3 12.2*  HCT  42.9 44.5 39.1 42.0 37.5*  MCV 93 95.3 94.4  --  94.7  PLT  --  262 182  --  154    Cardiac Enzymes: No results for input(s): CKTOTAL, CKMB, CKMBINDEX, TROPONINI in the last 168 hours.  Lipid Panel: No results for input(s): CHOL, TRIG, HDL, CHOLHDL, VLDL, LDLCALC in the last 168 hours.  CBG:  Recent Labs Lab 01/13/15 1542  01/13/15 1933 01/13/15 2356 01/14/15 0417 01/14/15 0826  GLUCAP 341* 323* 292* 267* 221*    Microbiology: Results for orders placed or performed during the hospital encounter of 01/10/15  MRSA PCR Screening     Status: None   Collection Time: 01/11/15 12:58 AM  Result Value Ref Range Status   MRSA by PCR NEGATIVE NEGATIVE Final    Comment:        The GeneXpert MRSA Assay (FDA approved for NASAL specimens only), is one component of a comprehensive MRSA colonization surveillance program. It is not intended to diagnose MRSA infection nor to guide or monitor treatment for MRSA infections.   Clostridium Difficile by PCR     Status: Abnormal   Collection Time: 01/12/15  3:01 PM  Result Value Ref Range Status   C difficile by pcr POSITIVE (A) NEGATIVE Final    Comment: CRITICAL RESULT CALLED TO, READ BACK BY AND VERIFIED WITH: T. CRITE RN 17:00 01/12/15 ([email or web address])     Coagulation Studies: No results for input(s): LABPROT, INR in the last 72 hours.  Imaging: Dg Chest Port 1 View  01/14/2015   CLINICAL DATA:  Cough.  Shortness of breath.  EXAM: PORTABLE CHEST - 1 VIEW  COMPARISON:  01/13/2015.  FINDINGS: Right IJ line, left power port catheter in stable position. Mediastinum and hilar structures are normal. Stable cardiomegaly. Normal pulmonary vascularity. Persistent bibasilar atelectasis and/or mild infiltrates.  IMPRESSION: 1. Right IJ line and left PowerPort catheter is in stable position. 2. Stable bibasilar atelectasis and/or infiltrates. No other change.   Electronically Signed   By: Marcello Moores  Register   On: 01/14/2015 07:16   Dg Chest Port 1 View  01/13/2015   CLINICAL DATA:  Collapse  EXAM: PORTABLE CHEST - 1 VIEW  COMPARISON:  01/12/2015  FINDINGS: Cardiac shadow is stable. A left chest wall port and right temporary dialysis catheter are again seen. Bibasilar atelectatic changes are noted. No pneumothorax is seen. No sizable effusion is noted.  IMPRESSION: Bibasilar atelectatic  changes.  These results will be called to the ordering clinician or representative by the Radiologist Assistant, and communication documented in the PACS or zVision Dashboard.   Electronically Signed   By: Inez Catalina M.D.   On: 01/13/2015 10:00    Medications:  Scheduled: . acetylcysteine  3 mL Nebulization TID  . therapeutic plasma exchange solution   Dialysis Once in dialysis  . therapeutic plasma exchange solution   Dialysis Once in dialysis  . albuterol  2.5 mg Nebulization TID  . amiodarone  100 mg Per Tube Daily  . antiseptic oral rinse  7 mL Mouth Rinse q12n4p  . calcium gluconate IVPB  4 g Intravenous Once  . chlorhexidine  15 mL Mouth Rinse BID  . famotidine  40 mg Oral Daily  . feeding supplement (PRO-STAT SUGAR FREE 64)  30 mL Per Tube Daily  . heparin  1,000 Units Intracatheter Once  . insulin aspart  0-15 Units Subcutaneous 6 times per day  . insulin aspart  4 Units Subcutaneous 6 times per day  . insulin glargine  10  Units Subcutaneous Daily  . metroNIDAZOLE  500 mg Oral 3 times per day  . piperacillin-tazobactam (ZOSYN)  IV  3.375 g Intravenous Q8H  . potassium chloride  30 mEq Per Tube Q4H  . predniSONE  60 mg Oral Q breakfast  . pyridostigmine  60 mg Oral 4 times per day  . rivaroxaban  20 mg Per Tube Q supper    Assessment/Plan:  74 yo M with SOB in the setting of myasthenia gravis and now found to be C-diff positive. NIF improved to -38 with VC 1. PLEX #2 planned for today.     Etta Quill PA-C [facility] [phone]  01/14/2015, 9:23 AM  I have seen and evaluated the patient. I have reviewed the above note. No bipap on today. PLEX #2 today. NIF slightly improved.   Roland Rack, MD [facility] [phone]  If 7pm- 7am, please page neurology on call as listed in [place].

## 2015-01-14 NOTE — Progress Notes (Signed)
NIF -21 VC 1.30

## 2015-01-14 NOTE — Consult Note (Addendum)
WOC wound consult note Reason for Consult: Consult requested for pressure ulcer to nose.  Pt has been wearing a Bipap mask prior to admission and wife at bedside assessed wound.  She states it has been present for several weeks and she has been using antibiotic ointment and gauze to pad the site when he wears the mask.  She feels it has been improving. Wound type: Stage 2 to the bridge of patient's nose Pressure Ulcer POA: Yes Measurement:.5X.3X.1cm Wound bed: 20%yellow, 80% red Drainage (amount, consistency, odor) Small amt yellow drainage, no odor Periwound: Intact skin surrounding  Dressing procedure/placement/frequency: Foam dressing to reduce pressure.  Bactroban ointment for moist healing.  Discussed plan of care with wife and she denies further questions.  It may be difficult to promote healing while in the hospital since pt is wearing Bipap much more frequently and for longer periods of time than when at home. Please re-consult if further assistance is needed.  Thank-you,  Julien Girt MSN, [place], [place], [place], [place]

## 2015-01-14 NOTE — Progress Notes (Signed)
NIF -38, VC 1 L/min Great effort.  Stronger cough this morning.

## 2015-01-15 ENCOUNTER — Inpatient Hospital Stay (HOSPITAL_COMMUNITY): Payer: PPO

## 2015-01-15 LAB — ACETYLCHOLINE RECEPTOR, BINDING: ACHR BINDING AB, SERUM: 34.2 nmol/L — AB (ref 0.00–0.24)

## 2015-01-15 LAB — BLOOD GAS, ARTERIAL
ACID-BASE DEFICIT: 0.8 mmol/L (ref 0.0–2.0)
Bicarbonate: 22.9 mEq/L (ref 20.0–24.0)
DRAWN BY: 33100
O2 CONTENT: 4 L/min
O2 Saturation: 94.9 %
Patient temperature: 97.9
TCO2: 24 mmol/L (ref 0–100)
pCO2 arterial: 33.9 mmHg — ABNORMAL LOW (ref 35.0–45.0)
pH, Arterial: 7.443 (ref 7.350–7.450)
pO2, Arterial: 72.6 mmHg — ABNORMAL LOW (ref 80.0–100.0)

## 2015-01-15 LAB — BASIC METABOLIC PANEL
Anion gap: 6 (ref 5–15)
BUN: 14 mg/dL (ref 6–23)
CO2: 26 mmol/L (ref 19–32)
Calcium: 8.4 mg/dL (ref 8.4–10.5)
Chloride: 109 mmol/L (ref 96–112)
Creatinine, Ser: 0.64 mg/dL (ref 0.50–1.35)
GFR calc Af Amer: >90 mL/min (ref >90)
GLUCOSE: 210 mg/dL — AB (ref 70–99)
POTASSIUM: 3.2 mmol/L — AB (ref 3.5–5.1)
SODIUM: 141 mmol/L (ref 135–145)

## 2015-01-15 LAB — CBC
HEMATOCRIT: 36.4 % — AB (ref 39.0–52.0)
Hemoglobin: 11.9 g/dL — ABNORMAL LOW (ref 13.0–17.0)
MCH: 30.7 pg (ref 26.0–34.0)
MCHC: 32.7 g/dL (ref 30.0–36.0)
MCV: 93.8 fL (ref 78.0–100.0)
Platelets: 155 10*3/uL (ref 150–400)
RBC: 3.88 MIL/uL — ABNORMAL LOW (ref 4.22–5.81)
RDW: 14.6 % (ref 11.5–15.5)
WBC: 13.5 10*3/uL — AB (ref 4.0–10.5)

## 2015-01-15 LAB — GLUCOSE, CAPILLARY
GLUCOSE-CAPILLARY: 281 mg/dL — AB (ref 70–99)
Glucose-Capillary: 245 mg/dL — ABNORMAL HIGH (ref 70–99)
Glucose-Capillary: 207 mg/dL — ABNORMAL HIGH (ref 70–99)
Glucose-Capillary: 197 mg/dL — ABNORMAL HIGH (ref 70–99)
Glucose-Capillary: 264 mg/dL — ABNORMAL HIGH (ref 70–99)
Glucose-Capillary: 244 mg/dL — ABNORMAL HIGH (ref 70–99)

## 2015-01-15 LAB — ACETYLCHOLINE RECEPTOR, MODULATING: ACETYLCHOLINE MODULAT AB: 56 % — AB (ref 0–20)

## 2015-01-15 MED ORDER — INSULIN GLARGINE 100 UNIT/ML ~~LOC~~ SOLN
15.0000 [IU] | Freq: Every day | SUBCUTANEOUS | Status: DC
  Filled 2015-01-15: qty 0.15

## 2015-01-15 MED ORDER — POLYETHYLENE GLYCOL 3350 17 G PO PACK
17.0000 g | PACK | Freq: Once | ORAL | Status: DC
  Filled 2015-01-15: qty 1

## 2015-01-15 MED ORDER — POTASSIUM CHLORIDE 20 MEQ/15ML (10%) PO SOLN
40.0000 meq | ORAL | Status: AC
  Administered 2015-01-15 (×2): 40 meq
  Filled 2015-01-15 (×3): qty 30

## 2015-01-15 MED ORDER — INSULIN GLARGINE 100 UNIT/ML ~~LOC~~ SOLN
5.0000 [IU] | Freq: Once | SUBCUTANEOUS | Status: AC
  Administered 2015-01-15: 5 [IU] via SUBCUTANEOUS
  Filled 2015-01-15 (×2): qty 0.05

## 2015-01-15 NOTE — Progress Notes (Signed)
PULMONARY / CRITICAL CARE MEDICINE   Name: Craig [name] MRN: [id number] DOB: July 17, 1941    ADMISSION DATE:  01/10/2015 CONSULTATION DATE:  01/11/15  REFERRING MD :  Dr. Doy Mince   CHIEF COMPLAINT:  Respiratory Failure  INITIAL PRESENTATION: [name], never smoker, with PMH of MG with recent flare and prolonged admission to [facility] who was admitted on 2/27 with worsening SOB, fatigue and low oxygen saturations.  Recently discharged 2/19 from CIR after MG exacerbation admit that required intubation / PLEX rx.    STUDIES:  2/28  CXR >> low lung volumes, L basilar atx   SIGNIFICANT EVENTS: 2/27  Admit with worsening SOB, fatigue and low O2 saturations.  NIF -30, -22, -20.  VC 450, 700, 610 ml 2/28  PCCM consulted for evaluation of worsening respiratory status.  NIF -40, VC 1.6L 2/29  R basilar atx  SUBJECTIVE: no distress this am 2 hrs off NIMV  VITAL SIGNS: Temp:  [96.6 F (35.9 C)-98.6 F (37 C)] 97.4 F (36.3 C) (03/03 0826) Pulse Rate:  [45-86] 69 (03/03 1100) Resp:  [10-32] 18 (03/03 1100) BP: (87-110)/(53-80) 88/53 mmHg (03/03 1100) SpO2:  [93 %-100 %] 94 % (03/03 1100) Weight:  [104.3 kg (229 lb 15 oz)] 104.3 kg (229 lb 15 oz) (03/03 0439)   HEMODYNAMICS:     VENTILATOR SETTINGS:     INTAKE / OUTPUT:  Intake/Output Summary (Last 24 hours) at 01/15/15 1119 Last data filed at 01/15/15 1055  Gross per 24 hour  Intake   2390 ml  Output   1725 ml  Net    665 ml    PHYSICAL EXAMINATION: General:  Chronically ill in NAD Neuro:  Generalized weakness, AAOx4 HEENT: jvd wnl, nose abrasion Cardiovascular:  s1s2 rrr, no m/r/g Lungs:  CTA anterior Abdomen:  Obese, soft, NT, PEG wnl Musculoskeletal:  No acute deformities, generalized weakness Skin:  Warm/dry, no edema.  Bridge of nose with small stage 2 (present on admit - was a dry scab, now open with moist wound bed) now covered    LABS:  CBC  Recent Labs Lab 01/12/15 0500  01/14/15 0500 01/14/15 1854  01/15/15 0505  WBC 11.3*  --  10.7*  --  13.5*  HGB 12.9*  < > 12.2* 12.9* 11.9*  HCT 39.1  < > 37.5* 38.0* 36.4*  PLT 182  --  154  --  155  < > = values in this interval not displayed. BMET  Recent Labs Lab 01/13/15 1030 01/14/15 0500 01/14/15 1854 01/15/15 0505  NA 147* 145 145 141  K 3.7 3.0* 3.9 3.2*  CL 116* 110 103 109  CO2 27 29  --  26  BUN 23 18 20 14   CREATININE 0.88 0.75 0.80 0.64  GLUCOSE 342* 252* 319* 210*   Electrolytes  Recent Labs Lab 01/13/15 0500 01/13/15 1030 01/14/15 0500 01/15/15 0505  CALCIUM  --  8.6 8.7 8.4  MG 2.0  --   --   --   PHOS 1.6*  --   --   --     ABG  Recent Labs Lab 01/12/15 1029  PHART 7.469*  PCO2ART 39.1  PO2ART 91.0    Glucose  Recent Labs Lab 01/14/15 1157 01/14/15 1559 01/14/15 2137 01/15/15 0022 01/15/15 0423 01/15/15 0828  GLUCAP 241* 302* 272* 245* 207* 197*    Imaging Dg Chest Port 1 View  01/14/2015   CLINICAL DATA:  Cough.  Shortness of breath.  EXAM: PORTABLE CHEST - 1 VIEW  COMPARISON:  01/13/2015.  FINDINGS: Right IJ line, left power port catheter in stable position. Mediastinum and hilar structures are normal. Stable cardiomegaly. Normal pulmonary vascularity. Persistent bibasilar atelectasis and/or mild infiltrates.  IMPRESSION: 1. Right IJ line and left PowerPort catheter is in stable position. 2. Stable bibasilar atelectasis and/or infiltrates. No other change.   Electronically Signed   By: Marcello Moores  Register   On: 01/14/2015 07:16     ASSESSMENT / PLAN:  NEUROLOGIC A:   Myasthenia Gravis with Acute Exacerbation  P:   PT as able  Neurology following PLEX initiated s/p treatment 3/2 S/p IVIG 2/28 Activity as tolerated, upright position Daily NIf, VC See pulm Add pT  PULMONARY OETT A: Acute Respiratory Failure - in setting of MG exacerbation OSA - on bipap 18/8 at baseline Bibasilar ATX P:   BIPAP to dc daytime? 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  Nosocomial exposure last 90 days C-diff pos (only 1 smear today), FP?Marland Kitchen  Hx recent positive at rehab.   Nasal Stage II Pressure Ulcer - present on admit, was dry scab, open with increase bipap use   P:   Zosyn 2/29 >>3/2 Ceftriaxone 3/2>>>add stop 9th Flagyl 2/29 >>extend 10 days post above empiric abx  2/29 sputum >> 2/29 C-diff >> positive  Ad stop date abx  ENDOCRINE A:   DM, uncontrolled  P:   SSI  Add 4 units insulin Q4 for TF coverage Lantus increase to 15 Lower d5 rate  FAMILY  - Updates:daily  - Inter-disciplinary family meet or Palliative Care meeting due by: 3/6  Ccm time 69 mi n  Daniel J. Titus Mould, MD, [place] Pgr: [facility]

## 2015-01-15 NOTE — Progress Notes (Signed)
Subjective: Feels approximately the same.   Exam: Filed Vitals:   01/15/15 0930  BP: 104/60  Pulse: 74  Temp:   Resp: 23   Gen: In bed, on Bipap MS: Awake, alert HF:SFSE, face symmetric, transverse smile.  Motor: good strength in LE, 4-/5 shoulder abduction on right, 4/5 on left.  Sensory:intact to LT   Impression: 74 yo M with SOB in the setting of myasthenia gravis. I suspect that this does represent continued disease activity. He is being started on a repeat course of plasma exchange. He responded last time, though he did have six treatments due to incomplete improvement. NIFs are stable.   Recommendations: 1) PLEX QOD x 5 treatments, tx 3 tomorrow.  2) will continue to follow.   Roland Rack, MD [facility] [phone]  If 7pm- 7am, please page neurology on call as listed in [place].

## 2015-01-15 NOTE — Progress Notes (Signed)
NIF -13    VC  1.1L with good effort.

## 2015-01-15 NOTE — Progress Notes (Signed)
Pt K+ was 3.2. Notified MD Nelda Marseille. Will continue to monitor.

## 2015-01-15 NOTE — Progress Notes (Signed)
NIF -32, VC .90

## 2015-01-16 ENCOUNTER — Inpatient Hospital Stay (HOSPITAL_COMMUNITY): Payer: PPO

## 2015-01-16 DIAGNOSIS — R069 Unspecified abnormalities of breathing: Secondary | ICD-10-CM | POA: Diagnosis present

## 2015-01-16 DIAGNOSIS — J989 Respiratory disorder, unspecified: Secondary | ICD-10-CM

## 2015-01-16 LAB — CLOSTRIDIUM DIFFICILE BY PCR: CDIFFPCR: POSITIVE — AB

## 2015-01-16 LAB — BASIC METABOLIC PANEL
Anion gap: 4 — ABNORMAL LOW (ref 5–15)
BUN: 16 mg/dL (ref 6–23)
CHLORIDE: 108 mmol/L (ref 96–112)
CO2: 31 mmol/L (ref 19–32)
CREATININE: 0.62 mg/dL (ref 0.50–1.35)
Calcium: 8.9 mg/dL (ref 8.4–10.5)
GFR calc Af Amer: >90 mL/min (ref >90)
GFR calc non Af Amer: >90 mL/min (ref >90)
Glucose, Bld: 142 mg/dL — ABNORMAL HIGH (ref 70–99)
POTASSIUM: 3.4 mmol/L — AB (ref 3.5–5.1)
SODIUM: 143 mmol/L (ref 135–145)

## 2015-01-16 LAB — GLUCOSE, CAPILLARY
Glucose-Capillary: 122 mg/dL — ABNORMAL HIGH (ref 70–99)
Glucose-Capillary: 125 mg/dL — ABNORMAL HIGH (ref 70–99)
Glucose-Capillary: 176 mg/dL — ABNORMAL HIGH (ref 70–99)
Glucose-Capillary: 205 mg/dL — ABNORMAL HIGH (ref 70–99)
Glucose-Capillary: 254 mg/dL — ABNORMAL HIGH (ref 70–99)
Glucose-Capillary: 300 mg/dL — ABNORMAL HIGH (ref 70–99)

## 2015-01-16 LAB — CBC
HEMATOCRIT: 38.1 % — AB (ref 39.0–52.0)
Hemoglobin: 12.5 g/dL — ABNORMAL LOW (ref 13.0–17.0)
MCH: 30.9 pg (ref 26.0–34.0)
MCHC: 32.8 g/dL (ref 30.0–36.0)
MCV: 94.1 fL (ref 78.0–100.0)
Platelets: 171 10*3/uL (ref 150–400)
RBC: 4.05 MIL/uL — ABNORMAL LOW (ref 4.22–5.81)
RDW: 14.7 % (ref 11.5–15.5)
WBC: 13.2 10*3/uL — AB (ref 4.0–10.5)

## 2015-01-16 LAB — POCT I-STAT, CHEM 8
BUN: 18 mg/dL (ref 6–23)
CALCIUM ION: 1.34 mmol/L — AB (ref 1.13–1.30)
Chloride: 103 mmol/L (ref 96–112)
Creatinine, Ser: 0.8 mg/dL (ref 0.50–1.35)
GLUCOSE: 138 mg/dL — AB (ref 70–99)
HCT: 39 % (ref 39.0–52.0)
Hemoglobin: 13.3 g/dL (ref 13.0–17.0)
Potassium: 3.4 mmol/L — ABNORMAL LOW (ref 3.5–5.1)
SODIUM: 143 mmol/L (ref 135–145)
TCO2: 24 mmol/L (ref 0–100)

## 2015-01-16 MED ORDER — ACETAMINOPHEN 325 MG PO TABS: 650.0000 mg | ORAL_TABLET | ORAL | Status: DC | PRN

## 2015-01-16 MED ORDER — HEPARIN SODIUM (PORCINE) 1000 UNIT/ML IJ SOLN: 1000.0000 [IU] | Freq: Once | INTRAMUSCULAR | Status: DC

## 2015-01-16 MED ORDER — SODIUM CHLORIDE 0.9 % IV SOLN: 4.0000 g | Freq: Once | INTRAVENOUS | Status: DC

## 2015-01-16 MED ORDER — SODIUM CHLORIDE 0.9 % IV SOLN
4.0000 g | Freq: Once | INTRAVENOUS | Status: DC
  Filled 2015-01-16: qty 40

## 2015-01-16 MED ORDER — ACD FORMULA A 0.73-2.45-2.2 GM/100ML VI SOLN
500.0000 mL | Status: DC
  Administered 2015-01-16: 500 mL via INTRAVENOUS
  Filled 2015-01-16 (×2): qty 500

## 2015-01-16 MED ORDER — DIPHENHYDRAMINE HCL 25 MG PO CAPS: 25.0000 mg | ORAL_CAPSULE | Freq: Four times a day (QID) | ORAL | Status: DC | PRN

## 2015-01-16 MED ORDER — SENNOSIDES 8.8 MG/5ML PO SYRP
5.0000 mL | ORAL_SOLUTION | Freq: Every day | ORAL | Status: DC | PRN
  Filled 2015-01-16: qty 5

## 2015-01-16 MED ORDER — ACD FORMULA A 0.73-2.45-2.2 GM/100ML VI SOLN
Status: AC
  Administered 2015-01-16: 11:00:00
  Filled 2015-01-16: qty 500

## 2015-01-16 MED ORDER — ACETAMINOPHEN 325 MG PO TABS
650.0000 mg | ORAL_TABLET | ORAL | Status: DC | PRN
Start: 2015-01-16 — End: 2015-01-17

## 2015-01-16 MED ORDER — SODIUM CHLORIDE 0.9 % IV SOLN: INTRAVENOUS | Status: DC

## 2015-01-16 MED ORDER — SODIUM CHLORIDE 0.9 % IV SOLN
INTRAVENOUS | Status: DC
  Administered 2015-01-16 – 2015-01-20 (×4): via INTRAVENOUS

## 2015-01-16 MED ORDER — CALCIUM GLUCONATE 10 % IV SOLN
4.0000 g | Freq: Once | INTRAVENOUS | Status: AC
  Administered 2015-01-16: 4 g via INTRAVENOUS
  Filled 2015-01-16 (×2): qty 40

## 2015-01-16 MED ORDER — ALBUMIN HUMAN 25 % IV SOLN
INTRAVENOUS | Status: AC
  Administered 2015-01-16 (×3): via INTRAVENOUS_CENTRAL
  Filled 2015-01-16 (×3): qty 200

## 2015-01-16 MED ORDER — INSULIN GLARGINE 100 UNIT/ML ~~LOC~~ SOLN
20.0000 [IU] | Freq: Every day | SUBCUTANEOUS | Status: DC
Start: 2015-01-16 — End: 2015-01-18
  Administered 2015-01-16 – 2015-01-18 (×3): 20 [IU] via SUBCUTANEOUS
  Filled 2015-01-16 (×3): qty 0.2

## 2015-01-16 MED ORDER — ACD FORMULA A 0.73-2.45-2.2 GM/100ML VI SOLN: 500.0000 mL | Status: DC

## 2015-01-16 MED ORDER — POTASSIUM CHLORIDE 20 MEQ/15ML (10%) PO SOLN
40.0000 meq | Freq: Once | ORAL | Status: AC
  Administered 2015-01-16: 40 meq
  Filled 2015-01-16: qty 30

## 2015-01-16 MED ORDER — HEPARIN SODIUM (PORCINE) 1000 UNIT/ML IJ SOLN
1000.0000 [IU] | Freq: Once | INTRAMUSCULAR | Status: AC
  Administered 2015-01-16: 1000 [IU]

## 2015-01-16 MED ORDER — ALBUMIN HUMAN 25 % IV SOLN
Freq: Once | INTRAVENOUS | Status: DC
  Filled 2015-01-16: qty 200

## 2015-01-16 MED ORDER — ACD FORMULA A 0.73-2.45-2.2 GM/100ML VI SOLN
500.0000 mL | Status: DC
  Filled 2015-01-16: qty 500

## 2015-01-16 MED ORDER — CALCIUM CARBONATE ANTACID 500 MG PO CHEW
CHEWABLE_TABLET | ORAL | Status: AC
  Filled 2015-01-16: qty 1

## 2015-01-16 NOTE — Progress Notes (Signed)
TPE completed w/ no complications. Pt alert, no c/o, vss. Primary RN present.

## 2015-01-16 NOTE — Progress Notes (Signed)
Pt. Performed -25 on the NIF and 1.5L on the vital capacity.

## 2015-01-16 NOTE — Progress Notes (Signed)
Subjective: Feels about the same .   Exam: Filed Vitals:   01/16/15 0754  BP:   Pulse:   Temp: 97.5 F (36.4 C)  Resp:    Gen: In bed, NAD MS: alert and awake NB:VAPO symmetric, PERRLA EOMI Motor: bilateral LE 4/5, left arm >right arm 4-/5 Sensory:intact throughout   Pertinent Labs: K+3.4 WBC 13.5 on 3/3  NIF -58, VC 1.1  Impression: 74 yo M with SOB in the setting of myasthenia gravis. I suspect that this does represent continued disease activity. He is being started on a repeat course of plasma exchange. He responded last time, though he did have six treatments due to incomplete improvement. NIFs -58 and VC 1.1  Recommendations: 1) PLEX QOD x 5 treatments, tx 3 today.  2) will continue to follow.   Etta Quill PA-C [facility] [phone]  01/16/2015, 9:08 AM   I have seen and evaluated the patient. I have reviewed the above note and made appropriate changes. Continue PLEX for MG.  Roland Rack, MD [facility] [phone]  If 7pm- 7am, please page neurology on call as listed in [place].

## 2015-01-16 NOTE — Progress Notes (Signed)
NUTRITION FOLLOW-UP  INTERVENTION: Continue TF via G-tube with Jevity 1.2 at 65 ml/hr and Prostat 30 ml daily   Provides 1972 kcal (100% of estimated needs), 101 grams of protein, 1259 ml of H20  NUTRITION DIAGNOSIS:  Inadequate oral intake related to dysphagia as evidenced by NPO status; ongoing  Goal: Pt to meet >/= 90% of estimated energy needs; ongoing  Monitor:  Tube feed tolerance/adequacy, weight, labs, I/O's  ASSESSMENT: 74 y/o male with past medical history of MG with recent flare and prolonged admission to [facility]. He was admitted on 2/27 with worsening SOB, fatigue and low oxygen saturations. Recently discharged 2/19 from CIR after MG exacerbation that required intubation. History of metastatic bladder cancer, DM2, CHF.  Nasal Cannula  G port placed 2/12 due to dysphagia. Currently receiving Jevity 1.2 at 65 ml/hr and Prostat 30 ml daily Continue TF regimen. Labs- low K  Usual bolus tube feeding routine: Bolus tube feeding of Jevity 1.2 via PEG 390 ml 4 times daily with 30 ml Prostat once daily to provide 1972 kcals, 102 grams of protein, and 1264 ml of free water. Provide free water flushes of 150 ml 4 times daily. Total daily water: 1864 ml.   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 obesity class I  Estimated Nutritional Needs: Kcal: 1850-2000 Protein: 100-115 grams Fluid: >/= 1.8L daily  Skin: intact  Diet Order: Diet NPO time specified   Intake/Output Summary (Last 24 hours) at 01/16/15 1013 Last data filed at 01/16/15 0500  Gross per 24 hour  Intake   1415 ml  Output    895 ml  Net    520 ml    Last BM: 2/29  Labs:   Recent Labs Lab 01/13/15 0500  01/14/15 0500 01/14/15 1854 01/15/15 0505 01/16/15 0409  NA  --   < > 145 145 141 143  K  --   < > 3.0* 3.9 3.2* 3.4*  CL  --   < >  110 103 109 108  CO2  --   < > 29  --  26 31  BUN  --   < > 18 20 14 16   CREATININE  --   < > 0.75 0.80 0.64 0.62  CALCIUM  --   < > 8.7  --  8.4 8.9  MG 2.0  --   --   --   --   --   PHOS 1.6*  --   --   --   --   --   GLUCOSE  --   < > 252* 319* 210* 142*  < > = values in this interval not displayed.  CBG (last 3)   Recent Labs  01/15/15 1935 01/16/15 0018 01/16/15 0723  GLUCAP 244* 205* 122*    Scheduled Meds: . therapeutic plasma exchange solution   Dialysis Q1 Hr x 3  . albuterol  2.5 mg Nebulization TID  . amiodarone  100 mg Per Tube Daily  . antiseptic oral rinse  7 mL Mouth Rinse q12n4p  . calcium carbonate      . calcium gluconate IVPB  4 g Intravenous Once  . cefTRIAXone (ROCEPHIN)  IV  2 g Intravenous Q24H  . chlorhexidine  15 mL Mouth Rinse BID  . citrate dextrose      .  famotidine  40 mg Oral Daily  . feeding supplement (PRO-STAT SUGAR FREE 64)  30 mL Per Tube Daily  . heparin  1,000 Units Intracatheter Once  . insulin aspart  0-15 Units Subcutaneous 6 times per day  . insulin aspart  4 Units Subcutaneous 6 times per day  . insulin glargine  15 Units Subcutaneous Daily  . metroNIDAZOLE  500 mg Oral 3 times per day  . mupirocin ointment   Topical Daily  . predniSONE  60 mg Oral Q breakfast  . pyridostigmine  60 mg Oral 4 times per day  . rivaroxaban  20 mg Per Tube Q supper    Continuous Infusions: . citrate dextrose    . dextrose 10 mL/hr at 01/15/15 1127  . feeding supplement (JEVITY 1.2 CAL) 1,000 mL (01/16/15 0407)     Wynona Dove, MS Dietetic Intern Pager: [phone]

## 2015-01-16 NOTE — Evaluation (Signed)
Physical Therapy Evaluation Patient Details Name: Craig [name] MRN: [id number] DOB: 07/16/1941 Today's Date: 01/16/2015   History of Present Illness  74 yo M with SOB in the setting of myasthenia gravis  Clinical Impression  Pt admitted with the above complications. Pt currently with functional limitations due to the deficits listed below (see PT Problem List). Required mod assist +2 for transfer today, HR elevated to 130 upon standing, 74BPM at rest. SpO2 95% on 4L supplemental O2. Feel he would benefit from short term SNF prior to returning home, but will continue to monitor for progress and update discharge recommendations as appropriate. Pt will benefit from skilled PT to increase their independence and safety with mobility to allow discharge to the venue listed below.       Follow Up Recommendations SNF    Equipment Recommendations  None recommended by PT    Recommendations for Other Services       Precautions / Restrictions Precautions Precautions: Fall Precaution Comments: [place] HR and O2 Restrictions Weight Bearing Restrictions: No      Mobility  Bed Mobility Overal bed mobility: Needs Assistance Bed Mobility: Supine to Sit;Sit to Supine     Supine to sit: Min guard;HOB elevated Sit to supine: Min guard   General bed mobility comments: Close guard for safety with HOB elevated. Cues for technique, requires extra time to come to edge of bed.  Transfers Overall transfer level: Needs assistance Equipment used: 2 person hand held assist Transfers: Sit to/from Stand Sit to Stand: Mod assist;+2 physical assistance         General transfer comment: Mod assist +2 for boost to stand from lowest bed setting. Tolerates x 1 minute each bout. Slow to rise. Cues for technique and upright posture upon standing.  Ambulation/Gait                Stairs            Wheelchair Mobility    Modified Rankin (Stroke Patients Only)       Balance Overall balance  assessment: Needs assistance Sitting-balance support: Feet supported;No upper extremity supported Sitting balance-Leahy Scale: Fair     Standing balance support: Bilateral upper extremity supported Standing balance-Leahy Scale: Poor Standing balance comment: Stood with +2 mod assist for stability. Able to perform static marching x10 before fatiguing and needed to sit.                             Pertinent Vitals/Pain Pain Assessment: No/denies pain    Home Living Family/patient expects to be discharged to:: Unsure Living Arrangements: Spouse/significant other Available Help at Discharge: Family;Available 24 hours/day Type of Home: House Home Access: Stairs to enter Entrance Stairs-Rails: Right Entrance Stairs-Number of Steps: 3 Home Layout: One level;Laundry or work area in [place]: Environmental consultant - 2 wheels;Cane - single point;Bedside commode;Shower seat      Prior Function Level of Independence: Needs assistance   Gait / Transfers Assistance Needed: RW for gait  ADL's / Homemaking Assistance Needed: needs assist to bath.  Comments: Infor represents time since returning home from previous CIR stay.     Hand Dominance   Dominant Hand: Right    Extremity/Trunk Assessment   Upper Extremity Assessment: Defer to OT evaluation           Lower Extremity Assessment: Generalized weakness      Cervical / Trunk Assessment: Kyphotic  Communication   Communication: Expressive difficulties  Cognition Arousal/Alertness:  Awake/alert Behavior During Therapy: Flat affect Overall Cognitive Status: Within Functional Limits for tasks assessed                      General Comments General comments (skin integrity, edema, etc.): HR elevated to 130 upon standing first trial, 117 on second attempt. 74 bpm at rest.    Exercises        Assessment/Plan    PT Assessment Patient needs continued PT services  PT Diagnosis Difficulty  walking;Generalized weakness   PT Problem List Decreased strength;Decreased activity tolerance;Decreased balance;Decreased mobility;Cardiopulmonary status limiting activity  PT Treatment Interventions DME instruction;Gait training;Stair training;Functional mobility training;Therapeutic activities;Therapeutic exercise;Balance training;Neuromuscular re-education;Patient/family education   PT Goals (Current goals can be found in the Care Plan section) Acute Rehab PT Goals Patient Stated Goal: get back to being independent again PT Goal Formulation: With patient/family Time For Goal Achievement: 01/30/15 Potential to Achieve Goals: Good    Frequency Min 3X/week   Barriers to discharge Inaccessible home environment 3 steps to enter house    Co-evaluation               End of Session Equipment Utilized During Treatment: Gait belt;Oxygen Activity Tolerance: Patient limited by fatigue Patient left: in chair;with call bell/phone within reach;with family/visitor present Nurse Communication: Mobility status         Time: 0941-1000 PT Time Calculation (min) (ACUTE ONLY): 19 min   Charges:   PT Evaluation $Initial PT Evaluation Tier I: 1 Procedure     PT G CodesEllouise Newer 01/16/2015, 11:09 AM  Elayne Snare, [place]

## 2015-01-16 NOTE — Progress Notes (Signed)
RT Note-FVC-1.0L                NIF-44

## 2015-01-16 NOTE — Care Management Note (Signed)
  Page 1 of 1   01/16/2015     11:06:05 AM CARE MANAGEMENT NOTE 01/16/2015  Patient:  Craig [name], Craig [name]   Account Number:  [id number]  Date Initiated:  01/11/2015  Documentation initiated by:  [name]  Subjective/Objective Assessment:   Reamitted with MG - SOB - IVIG and possible intubation     Action/Plan:   Anticipated DC Date:  01/16/2015   Anticipated DC Plan:  [place]  CM consult      Choice offered to / List presented to:             Status of service:  In process, will continue to follow Medicare Important Message given?  YES (If response is "NO", the following Medicare IM given date fields will be blank) Date Medicare IM given:  01/16/2015 Medicare IM given by:  Elissa Hefty Date Additional Medicare IM given:   Additional Medicare IM given by:    Discharge Disposition:    Per UR Regulation:  Reviewed for med. necessity/level of care/duration of stay  If discussed at [place] of Stay Meetings, dates discussed:   01/15/2015    Comments:  Contact:  Galeas,Martha Spouse Erma Son   [phone]  01-14-15  Donita Brooks, RNBSN [phone] Post last admission admitted to CIR. Today off and on bipap and getting #2 plasmaphoresis.

## 2015-01-16 NOTE — Progress Notes (Signed)
Pt transferred to 2 [facility] per MD order. Report called to RN. Pt transferred via bed with personal belongings. Attempted to call wife and update her, but no answer.

## 2015-01-16 NOTE — Telephone Encounter (Signed)
Follow up visit in March 14th 2016

## 2015-01-16 NOTE — Progress Notes (Signed)
PULMONARY / CRITICAL CARE MEDICINE   Name: Craig [name] MRN: [id number] DOB: [birth year]    ADMISSION DATE:  01/10/2015 CONSULTATION DATE:  01/11/15  REFERRING MD :  Dr. Doy Mince   CHIEF COMPLAINT:  Respiratory Failure  INITIAL PRESENTATION: [name], never smoker, with PMH of MG with recent flare and prolonged admission to [facility] who was admitted on 2/27 with worsening SOB, fatigue and low oxygen saturations.  Recently discharged 2/19 from CIR after MG exacerbation admit that required intubation / PLEX rx.    STUDIES:  2/28  CXR >> low lung volumes, L basilar atx   SIGNIFICANT EVENTS: 2/27  Admit with worsening SOB, fatigue and low O2 saturations.  NIF -30, -22, -20.  VC 450, 700, 610 ml 2/28  PCCM consulted for evaluation of worsening respiratory status.  NIF -40, VC 1.6L 2/29  R basilar atx 3/3- no daytime NIMV needed  SUBJECTIVE: no distress getting plex  VITAL SIGNS: Temp:  [97.4 F (36.3 C)-98.5 F (36.9 C)] 97.5 F (36.4 C) (03/04 0754) Pulse Rate:  [52-94] 69 (03/04 0600) Resp:  [7-25] 17 (03/04 0600) BP: (88-121)/(48-88) 90/48 mmHg (03/04 0600) SpO2:  [93 %-100 %] 96 % (03/04 0850) FiO2 (%):  [50 %] 50 % (03/04 0600) Weight:  [103.9 kg (229 lb 0.9 oz)] 103.9 kg (229 lb 0.9 oz) (03/04 0500)   HEMODYNAMICS:     VENTILATOR SETTINGS: Vent Mode:  [-]  FiO2 (%):  [50 %] 50 %   INTAKE / OUTPUT:  Intake/Output Summary (Last 24 hours) at 01/16/15 1045 Last data filed at 01/16/15 0500  Gross per 24 hour  Intake   1415 ml  Output    895 ml  Net    520 ml    PHYSICAL EXAMINATION: General:  Chronically ill in NAD Neuro:  Generalized weakness, AAOx4 HEENT: jvd wnl, nose abrasion unchanged Cardiovascular:  s1s2 rrr, no m/r/g Lungs:  CTA anterior, cough strong Abdomen:  Obese, soft, NT, PEG wnl Musculoskeletal:  No acute deformities, generalized weakness Skin:  Warm/dry, no edema.  Bridge of nose with small stage 2   LABS:  CBC  Recent Labs Lab 01/12/15 0500   01/14/15 0500 01/14/15 1854 01/15/15 0505  WBC 11.3*  --  10.7*  --  13.5*  HGB 12.9*  < > 12.2* 12.9* 11.9*  HCT 39.1  < > 37.5* 38.0* 36.4*  PLT 182  --  154  --  155  < > = values in this interval not displayed. BMET  Recent Labs Lab 01/14/15 0500 01/14/15 1854 01/15/15 0505 01/16/15 0409  NA 145 145 141 143  K 3.0* 3.9 3.2* 3.4*  CL 110 103 109 108  CO2 29  --  26 31  BUN 18 20 14 16   CREATININE 0.75 0.80 0.64 0.62  GLUCOSE 252* 319* 210* 142*   Electrolytes  Recent Labs Lab 01/13/15 0500  01/14/15 0500 01/15/15 0505 01/16/15 0409  CALCIUM  --   < > 8.7 8.4 8.9  MG 2.0  --   --   --   --   PHOS 1.6*  --   --   --   --   < > = values in this interval not displayed.  ABG  Recent Labs Lab 01/12/15 1029 01/15/15 1535  PHART 7.469* 7.443  PCO2ART 39.1 33.9*  PO2ART 91.0 72.6*    Glucose  Recent Labs Lab 01/15/15 0828 01/15/15 1206 01/15/15 1601 01/15/15 1935 01/16/15 0018 01/16/15 0723  GLUCAP 197* 264* 281* 244* 205*  122*    Imaging Dg Chest Port 1 View  01/15/2015   CLINICAL DATA:  Acute respiratory failure  EXAM: PORTABLE CHEST - 1 VIEW  COMPARISON:  Portable chest x-ray of 01/14/2015  FINDINGS: The lungs remain poorly aerated with bibasilar atelectasis present. The right central venous line tip overlies the mid lower SVC. Left-sided power port Port-A-Cath tip is near the expected SVC -RA junction. Cardiomegaly is stable.  IMPRESSION: Poor inspiration with bibasilar atelectasis.   Electronically Signed   By: Ivar Drape M.D.   On: 01/15/2015 07:15     ASSESSMENT / PLAN:  NEUROLOGIC A:   Myasthenia Gravis with Acute Exacerbation  P:   PT  Neurology following PLEX initiated s/p treatment 3/5 today S/p IVIG 2/28 Activity as tolerated, upright position Daily NIf, VC dc  PULMONARY OETT A: Acute Respiratory Failure - in setting of MG exacerbation OSA - on bipap 18/8 at baseline Bibasilar ATX P:   Successful off daytime NIMV x 24 hr, dc  daytime NIMV Continued nocturnal BIPAP Pulmonary hygiene:  Mobilize, chest vest  Aspiration precautions Chest PT Increase mobility pcxr in am for atx  CARDIOVASCULAR L CW Port >> A:  Chronic Atrial Fibrillation - on Xarelto at baseline HTN HLD P:  Xarelto  amiodarone PRN lopressor   RENAL A:   Hypophosphatemia Hypokalemia  P:   k supp 40 meq x 1 Trend chemistry   GASTROINTESTINAL / GU Chronic PEG >>  A:   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  Nosocomial exposure last 90 days C-diff pos (only 1 smear today), FP?Marland Kitchen  Hx recent positive at rehab.   Nasal Stage II Pressure Ulcer - present on admit, was dry scab, open with increase bipap use   P:   Zosyn 2/29 >>3/2 Ceftriaxone 3/2>>>add stop 9th Flagyl 2/29 >>extend 10 days post above empiric abx  2/29 sputum >> 2/29 C-diff >> positive  All abx have stop date  ENDOCRINE A:   DM, uncontrolled  P:   SSI  Add 4 units insulin Q4 for TF coverage Lantus increase to 20 Lower d5 rate, dc  FAMILY  - Updates:daily wife  - Inter-disciplinary family meet or Palliative Care meeting due by: 3/6   To sdu, [facility]  Lavon Paganini. Titus Mould, MD, [facility] Pgr: [facility]

## 2015-01-16 NOTE — Progress Notes (Signed)
Patient has had periods of bradycardia, HR in the 40s-50s. Patient is not symptomatic. Notified Elink Nurse about periods of Bradycardia.

## 2015-01-17 DIAGNOSIS — A047 Enterocolitis due to Clostridium difficile: Secondary | ICD-10-CM

## 2015-01-17 DIAGNOSIS — E1165 Type 2 diabetes mellitus with hyperglycemia: Secondary | ICD-10-CM

## 2015-01-17 LAB — BASIC METABOLIC PANEL
Anion gap: 6 (ref 5–15)
BUN: 14 mg/dL (ref 6–23)
CHLORIDE: 109 mmol/L (ref 96–112)
CO2: 28 mmol/L (ref 19–32)
CREATININE: 0.58 mg/dL (ref 0.50–1.35)
Calcium: 8.9 mg/dL (ref 8.4–10.5)
GFR calc Af Amer: >90 mL/min (ref >90)
GLUCOSE: 136 mg/dL — AB (ref 70–99)
Potassium: 3.5 mmol/L (ref 3.5–5.1)
Sodium: 143 mmol/L (ref 135–145)

## 2015-01-17 LAB — GLUCOSE, CAPILLARY
GLUCOSE-CAPILLARY: 109 mg/dL — AB (ref 70–99)
GLUCOSE-CAPILLARY: 245 mg/dL — AB (ref 70–99)
GLUCOSE-CAPILLARY: 272 mg/dL — AB (ref 70–99)
Glucose-Capillary: 140 mg/dL — ABNORMAL HIGH (ref 70–99)
Glucose-Capillary: 150 mg/dL — ABNORMAL HIGH (ref 70–99)
Glucose-Capillary: 239 mg/dL — ABNORMAL HIGH (ref 70–99)

## 2015-01-17 LAB — MAGNESIUM: Magnesium: 1.7 mg/dL (ref 1.5–2.5)

## 2015-01-17 MED ORDER — PREDNISONE 20 MG PO TABS
60.0000 mg | ORAL_TABLET | Freq: Every day | ORAL | Status: DC
  Administered 2015-01-18 – 2015-01-22 (×5): 60 mg
  Filled 2015-01-17 (×4): qty 1
  Filled 2015-01-17: qty 3
  Filled 2015-01-17: qty 1

## 2015-01-17 MED ORDER — METRONIDAZOLE 500 MG PO TABS
500.0000 mg | ORAL_TABLET | Freq: Three times a day (TID) | ORAL | Status: DC
  Administered 2015-01-17 – 2015-01-22 (×15): 500 mg
  Filled 2015-01-17 (×15): qty 1

## 2015-01-17 MED ORDER — FAMOTIDINE 40 MG/5ML PO SUSR
40.0000 mg | Freq: Every day | ORAL | Status: DC
  Administered 2015-01-18 – 2015-01-22 (×5): 40 mg
  Filled 2015-01-17 (×5): qty 5

## 2015-01-17 NOTE — Progress Notes (Signed)
Subjective: Feels some improvement  Exam: Filed Vitals:   01/17/15 1618  BP: 118/63  Pulse: 92  Temp: 97.5 F (36.4 C)  Resp: 23   Gen: In bed, NAD MS: alert and awake RF:VOHK symmetric, PERRLA EOMI Motor: bilateral LE 4/5, left arm >right arm 4-/5 Sensory:intact throughout  Able to count to 7 on a single breath   Pertinent Labs: Bmp - unremarkable.   Impression: 74 yo M with SOB in the setting of myasthenia gravis. I suspect that this does represent continued disease activity. He is being started on a repeat course of plasma exchange. He responded last time, though he did have six treatments due to incomplete improvement.   Recommendations: 1) PLEX QOD x 5 treatments, tx 4 tomorrow.  2) will continue to follow.   Etta Quill PA-C [facility] [phone]  01/17/2015, 7:28 PM   I have seen and evaluated the patient. I have reviewed the above note and made appropriate changes. Continue PLEX for MG.  Roland Rack, MD [facility] [phone]  If 7pm- 7am, please page neurology on call as listed in [place].

## 2015-01-17 NOTE — Progress Notes (Signed)
[place] TEAM 1 - Stepdown/ICU TEAM Progress Note  Craig [name] BZJ:[id number] DOB: 24-Jun-1941 DOA: 01/10/2015 PCP: Ann Held, MD  Admit HPI / Brief Narrative: [name] never smoker, with Hx of MG with recent flare and prolonged admission to [facility] who was admitted on 2/27 with worsening SOB, fatigue and low oxygen saturations. Recently discharged 2/19 from CIR after MG exacerbation admit that required intubation / PLEX rx.   Significant Events: 2/27 Admit with worsening SOB, fatigue and low O2 saturations. NIF -30, -22, -20. VC 450, 700, 610 ml 2/28 PCCM consulted for evaluation of worsening respiratory status. NIF -40, VC 1.6L 2/29 R basilar atx 3/3 no daytime NIMV needed  HPI/Subjective: Pt c/o some epigastric pain, which he states has been present "for days."  No sob, n/v, or chest pain.    Assessment/Plan:  Myasthenia Gravis with Acute Exacerbation  Neurology following - PLEX initiated - S/p IVIG 2/28 - Daily NIF - Neuro following   Acute Respiratory Failure - in setting of MG exacerbation off daytime NIMV - continues on nocturnal BIPAP  OSA on bipap 18/8 at baseline  Chronic Atrial Fibrillation on Xarelto at baseline  HTN BP currently well controlled   HLD  Hypophosphatemia F/u in AM  Hypokalemia  Improved - follow  Metastatic Bladder Cancer bladder cancer diagnosed in 2014, underwent resection, followed by chemotherapy for metastatic lesion to his abdominal wall, finished chemotherapy in October 2015 - CT 2/11 at CIR with progressive omental / peritoneal metastasis  Chronic Dysphagia  s/p PEG, bolus feeding at home with reported reflux - now on continuous feeds  C diff collitis  Cont flagyl tx - continues to have diarrhea    Possible aspiration PNA v/s atx Stop broad abx in setting of C diff - no convincing signs/sx of an active pulmonary infection   Nasal Stage II Pressure Ulcer present on admit, was dry scab, open with increase bipap use    Uncontrolled DM CBG variable - follow w/o change today   Code Status: FULL Family Communication: no family present at time of exam Disposition Plan: SDU  Consultants: Neurology PCCM  Antibiotics: Rocephin 3/2 > 3/5 Zosyn 2/29 > 3/2 Flagyl 3/1 >  DVT prophylaxis: xarelto   Objective: Blood pressure 112/55, pulse 67, temperature 98 F (36.7 C), temperature source Axillary, resp. rate 19, height 5\' 7"  (1.702 m), weight 106.5 kg (234 lb 12.6 oz), SpO2 99 %.  Intake/Output Summary (Last 24 hours) at 01/17/15 0727 Last data filed at 01/17/15 0400  Gross per 24 hour  Intake   1955 ml  Output   2200 ml  Net   -245 ml   Exam: General: No acute respiratory distress Lungs: Clear to auscultation bilaterally without wheezes or crackles Cardiovascular: Regular rate and rhythm without murmur gallop or rub normal S1 and S2 Abdomen: Mildly tender in epigastrium, nondistended, soft, bowel sounds positive, no rebound, no ascites, no appreciable mass Extremities: No significant cyanosis, clubbing, or edema bilateral lower extremities  Data Reviewed: Basic Metabolic Panel:  Recent Labs Lab 01/13/15 0500 01/13/15 1030 01/14/15 0500 01/14/15 1854 01/15/15 0505 01/16/15 0409 01/16/15 1050 01/17/15 0308  NA  --  147* 145 145 141 143 143 143  K  --  3.7 3.0* 3.9 3.2* 3.4* 3.4* 3.5  CL  --  116* 110 103 109 108 103 109  CO2  --  27 29  --  26 31  --  28  GLUCOSE  --  342* 252* 319* 210* 142* 138* 136*  BUN  --  23 18 20 14 16 18 14   CREATININE  --  0.88 0.75 0.80 0.64 0.62 0.80 0.58  CALCIUM  --  8.6 8.7  --  8.4 8.9  --  8.9  MG 2.0  --   --   --   --   --   --  1.7  PHOS 1.6*  --   --   --   --   --   --   --     Liver Function Tests: No results for input(s): AST, ALT, ALKPHOS, BILITOT, PROT, ALBUMIN in the last 168 hours. No results for input(s): LIPASE, AMYLASE in the last 168 hours. No results for input(s): AMMONIA in the last 168 hours.   CBC:  Recent Labs Lab  01/10/15 1630 01/12/15 0500  01/14/15 0500 01/14/15 1854 01/15/15 0505 01/16/15 1050 01/16/15 1059  WBC 17.7* 11.3*  --  10.7*  --  13.5*  --  13.2*  NEUTROABS  --   --   --  8.4*  --   --   --   --   HGB 14.5 12.9*  < > 12.2* 12.9* 11.9* 13.3 12.5*  HCT 44.5 39.1  < > 37.5* 38.0* 36.4* 39.0 38.1*  MCV 95.3 94.4  --  94.7  --  93.8  --  94.1  PLT 262 182  --  154  --  155  --  171  < > = values in this interval not displayed.  CBG:  Recent Labs Lab 01/16/15 1123 01/16/15 1546 01/16/15 1936 01/17/15 0034 01/17/15 0348  GLUCAP 176* 300* 254* 150* 140*    Recent Results (from the past 240 hour(s))  MRSA PCR Screening     Status: None   Collection Time: 01/11/15 12:58 AM  Result Value Ref Range Status   MRSA by PCR NEGATIVE NEGATIVE Final    Comment:        The GeneXpert MRSA Assay (FDA approved for NASAL specimens only), is one component of a comprehensive MRSA colonization surveillance program. It is not intended to diagnose MRSA infection nor to guide or monitor treatment for MRSA infections.   Clostridium Difficile by PCR     Status: Abnormal   Collection Time: 01/12/15  3:01 PM  Result Value Ref Range Status   C difficile by pcr POSITIVE (A) NEGATIVE Final    Comment: CRITICAL RESULT CALLED TO, READ BACK BY AND VERIFIED WITH: T. CRITE RN 17:00 01/12/15 ([email or web address])   Culture, Urine     Status: None   Collection Time: 01/13/15  3:44 PM  Result Value Ref Range Status   Specimen Description URINE, CATHETERIZED  Final   Special Requests NONE  Final   Colony Count NO GROWTH Performed at [facility]   Final   Culture NO GROWTH Performed at [facility]   Final   Report Status 01/14/2015 FINAL  Final  Clostridium Difficile by PCR     Status: Abnormal   Collection Time: 01/15/15  4:59 PM  Result Value Ref Range Status   C difficile by pcr POSITIVE (A) NEGATIVE Final    Comment: CRITICAL RESULT CALLED TO, READ BACK BY AND VERIFIED WITH: Brendolyn Patty RN 9:58 01/16/15 ([email or web address])      Studies:  Recent x-ray studies have been reviewed in detail by the Attending Physician  Scheduled Meds:  Scheduled Meds: . albuterol  2.5 mg Nebulization TID  . amiodarone  100 mg Per Tube Daily  . antiseptic oral rinse  7 mL Mouth Rinse q12n4p  . cefTRIAXone (ROCEPHIN)  IV  2 g Intravenous Q24H  . chlorhexidine  15 mL Mouth Rinse BID  . famotidine  40 mg Oral Daily  . feeding supplement (PRO-STAT SUGAR FREE 64)  30 mL Per Tube Daily  . insulin aspart  0-15 Units Subcutaneous 6 times per day  . insulin aspart  4 Units Subcutaneous 6 times per day  . insulin glargine  20 Units Subcutaneous Daily  . metroNIDAZOLE  500 mg Oral 3 times per day  . mupirocin ointment   Topical Daily  . predniSONE  60 mg Oral Q breakfast  . pyridostigmine  60 mg Oral 4 times per day  . rivaroxaban  20 mg Per Tube Q supper    Time spent on care of this patient: 35 mins   Nethan Caudillo T , MD   [facility] Office  [phone] Pager - Text Page per Shea Evans as per below:  On-Call/Text Page:      [email or web address]      password [email or web address]  If 7PM-7AM, please contact night-coverage [email or web address] Password [email or web address] 01/17/2015, 7:27 AM   LOS: 7 days

## 2015-01-18 DIAGNOSIS — E876 Hypokalemia: Secondary | ICD-10-CM

## 2015-01-18 LAB — MAGNESIUM: Magnesium: 1.7 mg/dL (ref 1.5–2.5)

## 2015-01-18 LAB — COMPREHENSIVE METABOLIC PANEL
ALK PHOS: 33 U/L — AB (ref 39–117)
ALT: 31 U/L (ref 0–53)
AST: 25 U/L (ref 0–37)
Albumin: 3.3 g/dL — ABNORMAL LOW (ref 3.5–5.2)
Anion gap: 4 — ABNORMAL LOW (ref 5–15)
BILIRUBIN TOTAL: 0.5 mg/dL (ref 0.3–1.2)
BUN: 15 mg/dL (ref 6–23)
CALCIUM: 8.8 mg/dL (ref 8.4–10.5)
CHLORIDE: 106 mmol/L (ref 96–112)
CO2: 34 mmol/L — ABNORMAL HIGH (ref 19–32)
CREATININE: 0.51 mg/dL (ref 0.50–1.35)
GFR calc Af Amer: >90 mL/min (ref >90)
Glucose, Bld: 125 mg/dL — ABNORMAL HIGH (ref 70–99)
POTASSIUM: 3 mmol/L — AB (ref 3.5–5.1)
SODIUM: 144 mmol/L (ref 135–145)
TOTAL PROTEIN: 4.2 g/dL — AB (ref 6.0–8.3)

## 2015-01-18 LAB — GLUCOSE, CAPILLARY
GLUCOSE-CAPILLARY: 142 mg/dL — AB (ref 70–99)
GLUCOSE-CAPILLARY: 107 mg/dL — AB (ref 70–99)
GLUCOSE-CAPILLARY: 232 mg/dL — AB (ref 70–99)
Glucose-Capillary: 129 mg/dL — ABNORMAL HIGH (ref 70–99)
Glucose-Capillary: 166 mg/dL — ABNORMAL HIGH (ref 70–99)

## 2015-01-18 LAB — CBC
HEMATOCRIT: 37.6 % — AB (ref 39.0–52.0)
HEMOGLOBIN: 12.4 g/dL — AB (ref 13.0–17.0)
MCH: 30.5 pg (ref 26.0–34.0)
MCHC: 33 g/dL (ref 30.0–36.0)
MCV: 92.6 fL (ref 78.0–100.0)
PLATELETS: 180 10*3/uL (ref 150–400)
RBC: 4.06 MIL/uL — ABNORMAL LOW (ref 4.22–5.81)
RDW: 15.5 % (ref 11.5–15.5)
WBC: 14.6 10*3/uL — ABNORMAL HIGH (ref 4.0–10.5)

## 2015-01-18 LAB — PHOSPHORUS: Phosphorus: 3.1 mg/dL (ref 2.3–4.6)

## 2015-01-18 LAB — LIPASE, BLOOD: Lipase: 146 U/L — ABNORMAL HIGH (ref 11–59)

## 2015-01-18 MED ORDER — CALCIUM CARBONATE ANTACID 500 MG PO CHEW
2.0000 | CHEWABLE_TABLET | ORAL | Status: AC
  Filled 2015-01-18 (×2): qty 2

## 2015-01-18 MED ORDER — ACETAMINOPHEN 325 MG PO TABS: 650.0000 mg | ORAL_TABLET | ORAL | Status: DC | PRN

## 2015-01-18 MED ORDER — POTASSIUM CHLORIDE 20 MEQ/15ML (10%) PO SOLN
40.0000 meq | Freq: Two times a day (BID) | ORAL | Status: DC
  Administered 2015-01-18 – 2015-01-19 (×2): 40 meq
  Filled 2015-01-18 (×2): qty 30

## 2015-01-18 MED ORDER — POTASSIUM CHLORIDE CRYS ER 20 MEQ PO TBCR: 40.0000 meq | EXTENDED_RELEASE_TABLET | Freq: Two times a day (BID) | ORAL | Status: DC

## 2015-01-18 MED ORDER — INSULIN GLARGINE 100 UNIT/ML ~~LOC~~ SOLN
22.0000 [IU] | Freq: Every day | SUBCUTANEOUS | Status: DC
  Administered 2015-01-19 – 2015-01-21 (×3): 22 [IU] via SUBCUTANEOUS
  Filled 2015-01-18 (×3): qty 0.22

## 2015-01-18 MED ORDER — HEPARIN SODIUM (PORCINE) 1000 UNIT/ML IJ SOLN
1000.0000 [IU] | Freq: Once | INTRAMUSCULAR | Status: DC
  Filled 2015-01-18: qty 1

## 2015-01-18 MED ORDER — SODIUM CHLORIDE 0.9 % IV SOLN
INTRAVENOUS | Status: AC
  Administered 2015-01-18 (×3): via INTRAVENOUS_CENTRAL
  Filled 2015-01-18 (×3): qty 200

## 2015-01-18 MED ORDER — ACD FORMULA A 0.73-2.45-2.2 GM/100ML VI SOLN
500.0000 mL | Status: DC
  Administered 2015-01-18 – 2015-01-20 (×3): 500 mL via INTRAVENOUS
  Filled 2015-01-18: qty 500

## 2015-01-18 MED ORDER — DIPHENHYDRAMINE HCL 25 MG PO CAPS: 25.0000 mg | ORAL_CAPSULE | Freq: Four times a day (QID) | ORAL | Status: DC | PRN

## 2015-01-18 MED ORDER — ACD FORMULA A 0.73-2.45-2.2 GM/100ML VI SOLN
Status: AC
  Administered 2015-01-18: 500 mL via INTRAVENOUS
  Filled 2015-01-18: qty 1000

## 2015-01-18 MED ORDER — SODIUM CHLORIDE 0.9 % IV SOLN
4.0000 g | Freq: Once | INTRAVENOUS | Status: AC
  Administered 2015-01-18: 4 g via INTRAVENOUS
  Filled 2015-01-18: qty 40

## 2015-01-18 MED ORDER — CALCIUM CARBONATE ANTACID 500 MG PO CHEW
CHEWABLE_TABLET | ORAL | Status: AC
  Filled 2015-01-18: qty 4

## 2015-01-18 NOTE — Progress Notes (Signed)
[place] TEAM 1 - Stepdown/ICU TEAM Progress Note  [name] VVO:[id number] DOB: 1941/02/08 DOA: 01/10/2015 PCP: Ann Held, MD  Admit HPI / Brief Narrative: [name] never smoker, with Hx of MG with recent flare and prolonged admission to [facility] who was admitted on 2/27 with worsening SOB, fatigue and low oxygen saturations. Recently discharged 2/19 from CIR after MG exacerbation admit that required intubation / PLEX rx.   Significant Events: 2/27 Admit with worsening SOB, fatigue and low O2 saturations. NIF -30, -22, -20. VC 450, 700, 610 ml 2/28 PCCM consulted for evaluation of worsening respiratory status. NIF -40, VC 1.6L 2/29 R basilar atx 3/3 no daytime NIMV needed  HPI/Subjective: Pt has no new complaints today.  He is anxious to be allowed sips of water.  He denies cp, n/v, abdom pain, or sob.   Assessment/Plan:  Myasthenia Gravis with Acute Exacerbation  Neurology following - PLEX initiated - S/p IVIG 2/28 - Daily NIF  Acute Respiratory Failure - in setting of MG exacerbation off daytime NIMV - continues on nocturnal BIPAP - appears stable   C diff collitis v/s colonization of colon  Cont flagyl tx - continues to have diarrhea, but this could be due to his tube feeds - he completed a 14 day course of flagyl last on 12/24/14 - will plan for tx with an additional 14 days w/ isolated flagyl tx for a possible first recurrence    OSA on bipap 18/8 at baseline  Chronic Atrial Fibrillation on Xarelto and oral amio - well controlled   HTN BP currently well controlled   HLD Resume lipitor when oral intake resumes   Hypophosphatemia F/u in AM  Hypokalemia  Due to GI losses w/ ongoing diarrhea - cont to replace and follow  Metastatic Bladder Cancer bladder cancer diagnosed in 2014, underwent resection, followed by chemotherapy for metastatic lesion to his abdominal wall, finished chemotherapy in October 2015 - CT 2/11 at CIR with progressive omental /  peritoneal metastasis  Chronic Dysphagia  s/p PEG, bolus feeding at home with reported reflux - now on continuous feeds - will ask SLP to recheck to see if sips of clears safe    Possible aspiration PNA v/s atx Stop broad abx in setting of C diff - no convincing signs/sx of an active pulmonary infection   Nasal Stage II Pressure Ulcer present on admit, was dry scab, open with increase bipap use - appears to be slowly improving   Uncontrolled DM  CBG variable - follow w/o change today   Code Status: FULL Family Communication: spoke w/ wife at bedside at length  Disposition Plan: SDU  Consultants: Neurology PCCM  Antibiotics: Rocephin 3/2 > 3/5 Zosyn 2/29 > 3/2 Flagyl 3/1 >  DVT prophylaxis: xarelto   Objective: Blood pressure 109/58, pulse 78, temperature 97.6 F (36.4 C), temperature source Axillary, resp. rate 21, height 5\' 7"  (1.702 m), weight 106.4 kg (234 lb 9.1 oz), SpO2 94 %.  Intake/Output Summary (Last 24 hours) at 01/18/15 1216 Last data filed at 01/18/15 0600  Gross per 24 hour  Intake   1605 ml  Output   1415 ml  Net    190 ml   Exam: General: No acute respiratory distress Lungs: Clear to auscultation bilaterally without wheezes or crackles Cardiovascular: Regular rate and rhythm without murmur gallop or rub  Abdomen: Mildly tender in epigastrium, nondistended, soft, bowel sounds positive, no rebound, no ascites, no appreciable mass Extremities: No significant cyanosis, clubbing, or edema bilateral lower  extremities  Data Reviewed: Basic Metabolic Panel:  Recent Labs Lab 01/13/15 0500  01/14/15 0500  01/15/15 0505 01/16/15 0409 01/16/15 1050 01/17/15 0308 01/18/15 0330  NA  --   < > 145  < > 141 143 143 143 144  K  --   < > 3.0*  < > 3.2* 3.4* 3.4* 3.5 3.0*  CL  --   < > 110  < > 109 108 103 109 106  CO2  --   < > 29  --  26 31  --  28 34*  GLUCOSE  --   < > 252*  < > 210* 142* 138* 136* 125*  BUN  --   < > 18  < > 14 16 18 14 15     CREATININE  --   < > 0.75  < > 0.64 0.62 0.80 0.58 0.51  CALCIUM  --   < > 8.7  --  8.4 8.9  --  8.9 8.8  MG 2.0  --   --   --   --   --   --  1.7 1.7  PHOS 1.6*  --   --   --   --   --   --   --  3.1  < > = values in this interval not displayed.  Liver Function Tests:  Recent Labs Lab 01/18/15 0330  AST 25  ALT 31  ALKPHOS 33*  BILITOT 0.5  PROT 4.2*  ALBUMIN 3.3*    Recent Labs Lab 01/18/15 0330  LIPASE 146*   CBC:  Recent Labs Lab 01/12/15 0500  01/14/15 0500 01/14/15 1854 01/15/15 0505 01/16/15 1050 01/16/15 1059 01/18/15 0250  WBC 11.3*  --  10.7*  --  13.5*  --  13.2* 14.6*  NEUTROABS  --   --  8.4*  --   --   --   --   --   HGB 12.9*  < > 12.2* 12.9* 11.9* 13.3 12.5* 12.4*  HCT 39.1  < > 37.5* 38.0* 36.4* 39.0 38.1* 37.6*  MCV 94.4  --  94.7  --  93.8  --  94.1 92.6  PLT 182  --  154  --  155  --  171 180  < > = values in this interval not displayed.  CBG:  Recent Labs Lab 01/17/15 1617 01/17/15 2027 01/18/15 0052 01/18/15 0606 01/18/15 0835  GLUCAP 272* 239* 129* 142* 107*    Recent Results (from the past 240 hour(s))  MRSA PCR Screening     Status: None   Collection Time: 01/11/15 12:58 AM  Result Value Ref Range Status   MRSA by PCR NEGATIVE NEGATIVE Final    Comment:        The GeneXpert MRSA Assay (FDA approved for NASAL specimens only), is one component of a comprehensive MRSA colonization surveillance program. It is not intended to diagnose MRSA infection nor to guide or monitor treatment for MRSA infections.   Clostridium Difficile by PCR     Status: Abnormal   Collection Time: 01/12/15  3:01 PM  Result Value Ref Range Status   C difficile by pcr POSITIVE (A) NEGATIVE Final    Comment: CRITICAL RESULT CALLED TO, READ BACK BY AND VERIFIED WITH: T. CRITE RN 17:00 01/12/15 ([email or web address])   Culture, Urine     Status: None   Collection Time: 01/13/15  3:44 PM  Result Value Ref Range Status   Specimen Description URINE,  CATHETERIZED  Final   Special Requests NONE  Final   Colony Count NO GROWTH Performed at [facility]   Final   Culture NO GROWTH Performed at [facility]   Final   Report Status 01/14/2015 FINAL  Final  Clostridium Difficile by PCR     Status: Abnormal   Collection Time: 01/15/15  4:59 PM  Result Value Ref Range Status   C difficile by pcr POSITIVE (A) NEGATIVE Final    Comment: CRITICAL RESULT CALLED TO, READ BACK BY AND VERIFIED WITH: Brendolyn Patty RN 9:58 01/16/15 ([email or web address])      Studies:  Recent x-ray studies have been reviewed in detail by the Attending Physician  Scheduled Meds:  Scheduled Meds: . albuterol  2.5 mg Nebulization TID  . amiodarone  100 mg Per Tube Daily  . antiseptic oral rinse  7 mL Mouth Rinse q12n4p  . chlorhexidine  15 mL Mouth Rinse BID  . famotidine  40 mg Per Tube Daily  . feeding supplement (PRO-STAT SUGAR FREE 64)  30 mL Per Tube Daily  . insulin aspart  0-15 Units Subcutaneous 6 times per day  . insulin aspart  4 Units Subcutaneous 6 times per day  . insulin glargine  20 Units Subcutaneous Daily  . metroNIDAZOLE  500 mg Per Tube 3 times per day  . mupirocin ointment   Topical Daily  . predniSONE  60 mg Per Tube Q breakfast  . pyridostigmine  60 mg Oral 4 times per day  . rivaroxaban  20 mg Per Tube Q supper    Time spent on care of this patient: 35 mins   Jamey Harman T , MD   [facility] Office  [phone] Pager - Text Page per Shea Evans as per below:  On-Call/Text Page:      [email or web address]      password [email or web address]  If 7PM-7AM, please contact night-coverage [email or web address] Password [email or web address] 01/18/2015, 12:16 PM   LOS: 8 days

## 2015-01-18 NOTE — Progress Notes (Signed)
Subjective: Feels some improvement  Exam: Filed Vitals:   01/18/15 0834  BP: 114/65  Pulse: 71  Temp: 98.4 F (36.9 C)  Resp: 20   Gen: In bed, NAD MS: alert and awake CB:JSEG symmetric, PERRLA EOMI Motor: bilateral LE 4/5, right arm abduction 4/5 Sensory:intact throughout  Able to count to 8 on a single breath   Pertinent Labs: Bmp - unremarkable.   Impression: 74 yo M with SOB in the setting of myasthenia gravis. I suspect that this does represent continued disease activity. He is being started on a repeat course of plasma exchange. He responded last time, though he did have six treatments due to incomplete improvement. He is starting to feel some improvement.   Recommendations: 1) PLEX QOD x 5 treatments, tx 4 due today, though may be postponed due to being Sunday.  2) will continue to follow.   Roland Rack, MD [facility] [phone]  If 7pm- 7am, please page neurology on call as listed in [place].

## 2015-01-19 LAB — POCT I-STAT, CHEM 8
BUN: 18 mg/dL (ref 6–23)
CHLORIDE: 101 mmol/L (ref 96–112)
CREATININE: 0.7 mg/dL (ref 0.50–1.35)
Calcium, Ion: 1.29 mmol/L (ref 1.13–1.30)
Glucose, Bld: 177 mg/dL — ABNORMAL HIGH (ref 70–99)
HCT: 40 % (ref 39.0–52.0)
Hemoglobin: 13.6 g/dL (ref 13.0–17.0)
Potassium: 3.4 mmol/L — ABNORMAL LOW (ref 3.5–5.1)
SODIUM: 145 mmol/L (ref 135–145)
TCO2: 28 mmol/L (ref 0–100)

## 2015-01-19 LAB — GLUCOSE, CAPILLARY
GLUCOSE-CAPILLARY: 170 mg/dL — AB (ref 70–99)
GLUCOSE-CAPILLARY: 151 mg/dL — AB (ref 70–99)
GLUCOSE-CAPILLARY: 235 mg/dL — AB (ref 70–99)
GLUCOSE-CAPILLARY: 223 mg/dL — AB (ref 70–99)
Glucose-Capillary: 144 mg/dL — ABNORMAL HIGH (ref 70–99)
Glucose-Capillary: 115 mg/dL — ABNORMAL HIGH (ref 70–99)
Glucose-Capillary: 182 mg/dL — ABNORMAL HIGH (ref 70–99)

## 2015-01-19 LAB — BASIC METABOLIC PANEL
Anion gap: 3 — ABNORMAL LOW (ref 5–15)
BUN: 15 mg/dL (ref 6–23)
CALCIUM: 9 mg/dL (ref 8.4–10.5)
CO2: 30 mmol/L (ref 19–32)
CREATININE: 0.52 mg/dL (ref 0.50–1.35)
Chloride: 111 mmol/L (ref 96–112)
GFR calc Af Amer: >90 mL/min (ref >90)
GLUCOSE: 175 mg/dL — AB (ref 70–99)
Potassium: 3.5 mmol/L (ref 3.5–5.1)
Sodium: 144 mmol/L (ref 135–145)

## 2015-01-19 MED ORDER — DIPHENHYDRAMINE HCL 25 MG PO CAPS: 25.0000 mg | ORAL_CAPSULE | Freq: Four times a day (QID) | ORAL | Status: DC | PRN

## 2015-01-19 MED ORDER — POTASSIUM CHLORIDE 20 MEQ/15ML (10%) PO SOLN
20.0000 meq | Freq: Two times a day (BID) | ORAL | Status: AC
  Administered 2015-01-19 – 2015-01-21 (×4): 20 meq
  Filled 2015-01-19 (×4): qty 15

## 2015-01-19 MED ORDER — ACETAMINOPHEN 325 MG PO TABS: 650.0000 mg | ORAL_TABLET | ORAL | Status: DC | PRN

## 2015-01-19 NOTE — Evaluation (Signed)
Clinical/Bedside Swallow Evaluation Patient Details  Name: Craig [name] MRN: [id number] Date of Birth: Jun 07, 1941  Today's Date: 01/19/2015 Time: SLP Start Time (ACUTE ONLY): 24 SLP Stop Time (ACUTE ONLY): 1545 SLP Time Calculation (min) (ACUTE ONLY): 15 min  Past Medical History:  Past Medical History  Diagnosis Date  . Atrial fibrillation, chronic     on Xarelto   . HTN (hypertension)   . CHF (congestive heart failure)   . Diabetes mellitus   . 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 Myasthenia gravis    Past Surgical History:  Past Surgical History  Procedure Laterality Date  . Abdominal wall metastasis removal     HPI:  [name], never smoker, transferred to [facility] on 1/22 with acute MG exacerbation & respiratory failure. Decompensated requiring intubation 1/22-1/25.  The patient was subsequently transferred to the rehab unit for several weeks of therapy and was home for approximately a week prior to readmission for trouble breathing.  The patient's wife reports that [facility] had seen the patient for an evaluation only and that therapy had not begun.  The patient is currently NPO receiving continuous PEG feeds.   Assessment / Plan / Recommendation Clinical Impression  The patient was seen for a clinical evaluation of swallowing.  The patient had a previous MBS dated 12/25/2014 which showed moderately-severe oral/pharyngeal dysphagia  with both sensory and motor components. The patient silently penetrated/aspirated all textures.  It was recommended at that time that the patient remain NPO and participate in swallowing therapy.  At bedside today, the patient's oral motor exam appears improved since last assessment.  The patient has good lingual and labial range of motion (except lingual elevation) and strength is mildly reduced.  The patient was given ice chips with good tolerance today.  Overt sign/symptoms of aspiration were not noted  during.  The patients respiratory rate and oxygen levels remained stable throughout.  The patient was also able to masticate the ice chips which per the wife is an improvement.  The patient also reports that he feels his swallowing has improved.  Recommend continue NPO status and ST to follow up for swallowing therapy and consideration of repeat objective measure to assess for possible improvement of swallowing.      Aspiration Risk  Severe    Diet Recommendation NPO   Medication Administration: Via alternative means    Other  Recommendations Oral Care Recommendations: Oral care Q4 per protocol   Follow Up Recommendations   ([facility] vs inpatient rehab)    Frequency and Duration min 2x/week  2 weeks     Swallow Study Prior Functional Status       General Date of Onset: 12/05/14 HPI: [name], never smoker, transferred to [facility] on 1/22 with acute MG exacerbation & respiratory failure. Decompensated requiring intubation 1/22-1/25.  The patient was subsequently transferred to the rehab unit for several weeks of therapy and was home for approximately a week prior to readmission for trouble breathing.  The patient's wife reports that [facility] had seen the patient for an evaluation only and that therapy had not begun.  The patient is currently NPO receiving continuous PEG feeds. Type of Study: Bedside swallow evaluation Previous Swallow Assessment: 12/25/2014  MBS with rx for NPO Diet Prior to this Study: NPO;PEG tube Temperature Spikes Noted: No Respiratory Status: Nasal cannula History of Recent Intubation: Yes Length of Intubations (days): 4 days Date extubated: 12/08/14 Behavior/Cognition: Alert;Cooperative;Pleasant mood Oral  Cavity - Dentition: Missing dentition Self-Feeding Abilities: Total assist Patient Positioning: Upright in chair Baseline Vocal Quality: Clear Volitional Cough: Strong Volitional Swallow: Able to elicit    Oral/Motor/Sensory Function Overall Oral Motor/Sensory  Function: Impaired Labial ROM: Within Functional Limits Labial Symmetry: Within Functional Limits Labial Strength: Reduced Lingual ROM: Other (Comment) (poor lingual elevation noted) Lingual Symmetry: Within Functional Limits Lingual Strength: Reduced Facial ROM: Within Functional Limits Facial Symmetry: Within Functional Limits Facial Strength: Reduced Velum: Within Functional Limits Mandible: Within Functional Limits   Ice Chips Ice chips: Impaired Presentation: Spoon Oral Phase Impairments: Reduced lingual movement/coordination;Reduced labial seal Oral Phase Functional Implications: Prolonged oral transit Pharyngeal Phase Impairments: Suspected delayed Swallow;Decreased hyoid-laryngeal movement   Thin Liquid Thin Liquid: Not tested    Nectar Thick Nectar Thick Liquid: Not tested   Honey Thick Honey Thick Liquid: Not tested   Puree Puree: Not tested   Solid   GO    Solid: Not tested       Craig [name] N 01/19/2015,4:00 PM  Craig [name], [place], [place] [facility] SLP [phone]

## 2015-01-19 NOTE — Progress Notes (Addendum)
Physical Therapy Treatment Patient Details Name: Craig [name] MRN: [id number] DOB: [birth year] Today's Date: 01/19/2015    History of Present Illness 74 yo M with SOB in the setting of myasthenia gravis    PT Comments    Patient progressing towards PT goals, less physical assist required. Patient main limiting factor is pain in abdominal area. Patient tolerated transfer training x4 as well as self care and there-ex. Will continue to see and progress as tolerated.   Follow Up Recommendations  CIR     Equipment Recommendations  None recommended by PT    Recommendations for Other Services Rehab consult     Precautions / Restrictions Precautions Precautions: Fall Precaution Comments: [place] HR and O2 Restrictions Weight Bearing Restrictions: No    Mobility  Bed Mobility Overal bed mobility: Needs Assistance Bed Mobility: Supine to Sit;Sit to Supine     Supine to sit: Min guard;HOB elevated Sit to supine: Min guard   General bed mobility comments: No physical assist required, patient able to come to EOB quickly, min guard for safety wikth lines  Transfers Overall transfer level: Needs assistance Equipment used: Rolling walker (2 wheeled);2 person hand held assist Transfers: Sit to/from [facility] Sit to Stand: Min assist;+2 physical assistance Stand pivot transfers: Min guard       General transfer comment: min assist to elevate to standing, performed x4 during session with extended static standing trials. Vcs for hand placement and safety   Ambulation/Gait                 Stairs            Wheelchair Mobility    Modified Rankin (Stroke Patients Only)       Balance   Sitting-balance support: Feet supported Sitting balance-Leahy Scale: Fair       Standing balance-Leahy Scale: Poor Standing balance comment: Heavy reliance on UE support with RW during static standing trials. performed x4                      Cognition Arousal/Alertness: Awake/alert Behavior During Therapy: Flat affect Overall Cognitive Status: Impaired/Different from baseline Area of Impairment: Safety/judgement;Problem solving         Safety/Judgement: Decreased awareness of safety   Problem Solving: Slow processing;Requires verbal cues;Requires tactile cues      Exercises General Exercises - Lower Extremity Ankle Circles/Pumps: AROM;Both;10 reps;Seated Hip Flexion/Marching: AROM;Both;5 reps;Standing (with Rw, x2)    General Comments General comments (skin integrity, edema, etc.): pericare performed noted leakage from rectal tube site, barrier cream #3 applied.       Pertinent Vitals/Pain Pain Assessment: Faces Faces Pain Scale: Hurts even more Pain Location: abdomen Pain Descriptors / Indicators: Discomfort;Aching Pain Intervention(s): Limited activity within patient's tolerance;Monitored during session;Repositioned    Home Living                      Prior Function            PT Goals (current goals can now be found in the care plan section) Acute Rehab PT Goals Patient Stated Goal: get back to being independent again PT Goal Formulation: With patient/family Time For Goal Achievement: 01/30/15 Potential to Achieve Goals: Good Progress towards PT goals: Progressing toward goals    Frequency  Min 3X/week    PT Plan Current plan remains appropriate    Co-evaluation             End of Session Equipment Utilized During  Treatment: Gait belt;Oxygen Activity Tolerance: Patient limited by fatigue Patient left: in chair;with call bell/phone within reach;with family/visitor present     Time: 7622-6333 PT Time Calculation (min) (ACUTE ONLY): 25 min  Charges:  $Therapeutic Exercise: 8-22 mins $Therapeutic Activity: 8-22 mins                    G CodesDuncan Dull 2015-01-25, 4:32 PM Alben Deeds, [place] DPT  [phone]

## 2015-01-19 NOTE — Progress Notes (Addendum)
[place] TEAM 1 - Stepdown/ICU TEAM Progress Note  Craig [name] UVO:[id number] DOB: 12/07/1940 DOA: 01/10/2015 PCP: Ann Held, MD  Admit HPI / Brief Narrative: [name] never smoker, with Hx of MG with recent flare and prolonged admission to [facility] who was admitted on 2/27 with worsening SOB, fatigue and low oxygen saturations. Recently discharged 2/19 from CIR after MG exacerbation admit that required intubation / PLEX rx.   Significant Events: 2/27 Admit with worsening SOB, fatigue and low O2 saturations. NIF -30, -22, -20. VC 450, 700, 610 ml 2/28 PCCM consulted for evaluation of worsening respiratory status. NIF -40, VC 1.6L 2/29 R basilar atx 3/3 no daytime NIMV needed  HPI/Subjective: Pt c/o some "deep crampy" type abdom discomfort in the RLQ.  His bowel movements have not changed in character.  He denies nausea or vomiting.  His discomfort has improved spontaneously.  He denies cp or sob, and feels that he is "a little bit stronger" in general.    Assessment/Plan:  Myasthenia Gravis with Acute Exacerbation  Neurology following - PLEX initiated - S/p IVIG 2/28 - Daily NIF - appears to be slowly improving   Acute Respiratory Failure - in setting of MG exacerbation off daytime NIMV - continues on nocturnal BIPAP - appears stable from respiratory standpoint   C diff collitis v/s colonization of colon  Cont flagyl tx - continues to have diarrhea, but this could be due to his tube feeds - he completed a 14 day course of flagyl w/ last dose on 12/24/14 - will plan for tx with an additional 14 days w/ isolated flagyl tx for a possible first recurrence   OSA on bipap 18/8 at baseline  Chronic Atrial Fibrillation on Xarelto and oral amio - well controlled   HTN BP currently well controlled   HLD Resume lipitor when oral intake resumes   Hypophosphatemia Resolved   Hypokalemia  Due to GI losses w/ ongoing diarrhea - cont to replace and follow  Metastatic Bladder  Cancer bladder cancer diagnosed in 2014, underwent resection, followed by chemotherapy for metastatic lesion to his abdominal wall, finished chemotherapy in October 2015 - CT 2/11 at CIR with progressive omental / peritoneal metastasis  Chronic Dysphagia  s/p PEG - bolus feeding at home with reported reflux - now on continuous feeds and tolerating well - will ask SLP to recheck to see if sips of clears safe   Possible aspiration PNA v/s atx Stopped broad abx in setting of C diff - no convincing signs/sx of an active pulmonary infection   Nasal Stage II Pressure Ulcer present on admit, was dry scab, open with increase bipap use - appears to be slowly improving - pt denies pain in wound, and feels it is getting better   Uncontrolled DM  CBG variable - follow w/o change today   Code Status: FULL Family Communication: spoke w/ wife at bedside at length  Disposition Plan: SDU until [place] proven to be stable - CIR v/s SNF once PLEX completed    Consultants: Neurology PCCM  Antibiotics: Rocephin 3/2 > 3/5 Zosyn 2/29 > 3/2 Flagyl 3/1 > (unapposed since 3/6)  DVT prophylaxis: xarelto   Objective: Blood pressure 101/47, pulse 89, temperature 97.2 F (36.2 C), temperature source Axillary, resp. rate 21, height 5\' 7"  (1.702 m), weight 106.8 kg (235 lb 7.2 oz), SpO2 94 %.  Intake/Output Summary (Last 24 hours) at 01/19/15 1432 Last data filed at 01/19/15 0600  Gross per 24 hour  Intake   1800  ml  Output   1395 ml  Net    405 ml   Exam: General: No acute respiratory distress - alert and conversant  Lungs: Clear to auscultation bilaterally without wheezes or crackles Cardiovascular: Regular rate and rhythm without murmur gallop or rub  Abdomen: No tenderness to deep palpation diffusely, obese, soft, bowel sounds positive, no rebound, no ascites, no appreciable mass - PEG insertion site clean and dry  Extremities: No significant cyanosis, clubbing, or edema bilateral lower  extremities  Data Reviewed: Basic Metabolic Panel:  Recent Labs Lab 01/13/15 0500  01/15/15 0505 01/16/15 0409 01/16/15 1050 01/17/15 0308 01/18/15 0330 01/18/15 2033 01/19/15 0130  NA  --   < > 141 143 143 143 144 145 144  K  --   < > 3.2* 3.4* 3.4* 3.5 3.0* 3.4* 3.5  CL  --   < > 109 108 103 109 106 101 111  CO2  --   < > 26 31  --  28 34*  --  30  GLUCOSE  --   < > 210* 142* 138* 136* 125* 177* 175*  BUN  --   < > 14 16 18 14 15 18 15   CREATININE  --   < > 0.64 0.62 0.80 0.58 0.51 0.70 0.52  CALCIUM  --   < > 8.4 8.9  --  8.9 8.8  --  9.0  MG 2.0  --   --   --   --  1.7 1.7  --   --   PHOS 1.6*  --   --   --   --   --  3.1  --   --   < > = values in this interval not displayed.  Liver Function Tests:  Recent Labs Lab 01/18/15 0330  AST 25  ALT 31  ALKPHOS 33*  BILITOT 0.5  PROT 4.2*  ALBUMIN 3.3*    Recent Labs Lab 01/18/15 0330  LIPASE 146*   CBC:  Recent Labs Lab 01/14/15 0500  01/15/15 0505 01/16/15 1050 01/16/15 1059 01/18/15 0250 01/18/15 2033  WBC 10.7*  --  13.5*  --  13.2* 14.6*  --   NEUTROABS 8.4*  --   --   --   --   --   --   HGB 12.2*  < > 11.9* 13.3 12.5* 12.4* 13.6  HCT 37.5*  < > 36.4* 39.0 38.1* 37.6* 40.0  MCV 94.7  --  93.8  --  94.1 92.6  --   PLT 154  --  155  --  171 180  --   < > = values in this interval not displayed.  CBG:  Recent Labs Lab 01/18/15 2035 01/19/15 0106 01/19/15 0404 01/19/15 0831 01/19/15 1215  GLUCAP 170* 144* 151* 115* 182*    Recent Results (from the past 240 hour(s))  MRSA PCR Screening     Status: None   Collection Time: 01/11/15 12:58 AM  Result Value Ref Range Status   MRSA by PCR NEGATIVE NEGATIVE Final    Comment:        The GeneXpert MRSA Assay (FDA approved for NASAL specimens only), is one component of a comprehensive MRSA colonization surveillance program. It is not intended to diagnose MRSA infection nor to guide or monitor treatment for MRSA infections.   Clostridium  Difficile by PCR     Status: Abnormal   Collection Time: 01/12/15  3:01 PM  Result Value Ref Range Status   C difficile by pcr  POSITIVE (A) NEGATIVE Final    Comment: CRITICAL RESULT CALLED TO, READ BACK BY AND VERIFIED WITH: T. CRITE RN 17:00 01/12/15 ([email or web address])   Culture, Urine     Status: None   Collection Time: 01/13/15  3:44 PM  Result Value Ref Range Status   Specimen Description URINE, CATHETERIZED  Final   Special Requests NONE  Final   Colony Count NO GROWTH Performed at [facility]   Final   Culture NO GROWTH Performed at [facility]   Final   Report Status 01/14/2015 FINAL  Final  Clostridium Difficile by PCR     Status: Abnormal   Collection Time: 01/15/15  4:59 PM  Result Value Ref Range Status   C difficile by pcr POSITIVE (A) NEGATIVE Final    Comment: CRITICAL RESULT CALLED TO, READ BACK BY AND VERIFIED WITH: Brendolyn Patty RN 9:58 01/16/15 ([email or web address])      Studies:  Recent x-ray studies have been reviewed in detail by the Attending Physician  Scheduled Meds:  Scheduled Meds: . albuterol  2.5 mg Nebulization TID  . amiodarone  100 mg Per Tube Daily  . antiseptic oral rinse  7 mL Mouth Rinse q12n4p  . chlorhexidine  15 mL Mouth Rinse BID  . famotidine  40 mg Per Tube Daily  . feeding supplement (PRO-STAT SUGAR FREE 64)  30 mL Per Tube Daily  . heparin  1,000 Units Intracatheter Once  . insulin aspart  0-15 Units Subcutaneous 6 times per day  . insulin aspart  4 Units Subcutaneous 6 times per day  . insulin glargine  22 Units Subcutaneous Daily  . metroNIDAZOLE  500 mg Per Tube 3 times per day  . mupirocin ointment   Topical Daily  . potassium chloride  40 mEq Per Tube BID  . predniSONE  60 mg Per Tube Q breakfast  . pyridostigmine  60 mg Oral 4 times per day  . rivaroxaban  20 mg Per Tube Q supper    Time spent on care of this patient: 35 mins   Allexis Bordenave T , MD   [facility] Office  [phone] Pager - Text Page per Shea Evans  as per below:  On-Call/Text Page:      [email or web address]      password [email or web address]  If 7PM-7AM, please contact night-coverage [email or web address] Password [email or web address] 01/19/2015, 2:32 PM   LOS: 9 days

## 2015-01-19 NOTE — Progress Notes (Signed)
Pt refused qhs Bipap for tonight. Pt states he did not wear Bipap last night and does not want to wear it tonight. Pt is resting comfortably on 3lpm nasal cannula SpO2 98% RR-18. RT will continue to monitor.

## 2015-01-19 NOTE — Progress Notes (Signed)
Patient does not want to wear BIPAP due to skin irritation on nose from BIPAP mask.

## 2015-01-19 NOTE — Progress Notes (Signed)
Pt performed NIF 3 times and gave great effort each time. NIF was measured at -42cmH2O.

## 2015-01-20 ENCOUNTER — Inpatient Hospital Stay (HOSPITAL_COMMUNITY): Payer: PPO

## 2015-01-20 ENCOUNTER — Other Ambulatory Visit: Payer: PPO

## 2015-01-20 DIAGNOSIS — I482 Chronic atrial fibrillation: Secondary | ICD-10-CM

## 2015-01-20 DIAGNOSIS — G4733 Obstructive sleep apnea (adult) (pediatric): Secondary | ICD-10-CM

## 2015-01-20 DIAGNOSIS — E1165 Type 2 diabetes mellitus with hyperglycemia: Secondary | ICD-10-CM | POA: Diagnosis present

## 2015-01-20 DIAGNOSIS — J9601 Acute respiratory failure with hypoxia: Secondary | ICD-10-CM

## 2015-01-20 DIAGNOSIS — C7911 Secondary malignant neoplasm of bladder: Secondary | ICD-10-CM

## 2015-01-20 DIAGNOSIS — O10019 Pre-existing essential hypertension complicating pregnancy, unspecified trimester: Secondary | ICD-10-CM | POA: Diagnosis present

## 2015-01-20 DIAGNOSIS — A0472 Enterocolitis due to Clostridium difficile, not specified as recurrent: Secondary | ICD-10-CM | POA: Diagnosis present

## 2015-01-20 DIAGNOSIS — E785 Hyperlipidemia, unspecified: Secondary | ICD-10-CM

## 2015-01-20 DIAGNOSIS — R1310 Dysphagia, unspecified: Secondary | ICD-10-CM

## 2015-01-20 DIAGNOSIS — E876 Hypokalemia: Secondary | ICD-10-CM | POA: Diagnosis present

## 2015-01-20 DIAGNOSIS — IMO0002 Reserved for concepts with insufficient information to code with codable children: Secondary | ICD-10-CM | POA: Diagnosis present

## 2015-01-20 LAB — BASIC METABOLIC PANEL
Anion gap: 3 — ABNORMAL LOW (ref 5–15)
BUN: 17 mg/dL (ref 6–23)
CO2: 32 mmol/L (ref 19–32)
CREATININE: 0.56 mg/dL (ref 0.50–1.35)
Calcium: 8.9 mg/dL (ref 8.4–10.5)
Chloride: 111 mmol/L (ref 96–112)
Glucose, Bld: 133 mg/dL — ABNORMAL HIGH (ref 70–99)
POTASSIUM: 3.5 mmol/L (ref 3.5–5.1)
Sodium: 146 mmol/L — ABNORMAL HIGH (ref 135–145)

## 2015-01-20 LAB — GLUCOSE, CAPILLARY
GLUCOSE-CAPILLARY: 177 mg/dL — AB (ref 70–99)
GLUCOSE-CAPILLARY: 178 mg/dL — AB (ref 70–99)
GLUCOSE-CAPILLARY: 207 mg/dL — AB (ref 70–99)
GLUCOSE-CAPILLARY: 88 mg/dL (ref 70–99)
Glucose-Capillary: 130 mg/dL — ABNORMAL HIGH (ref 70–99)
Glucose-Capillary: 213 mg/dL — ABNORMAL HIGH (ref 70–99)

## 2015-01-20 LAB — POCT I-STAT, CHEM 8
BUN: 17 mg/dL (ref 6–23)
CREATININE: 0.8 mg/dL (ref 0.50–1.35)
Calcium, Ion: 1.34 mmol/L — ABNORMAL HIGH (ref 1.13–1.30)
Chloride: 104 mmol/L (ref 96–112)
GLUCOSE: 92 mg/dL (ref 70–99)
HCT: 39 % (ref 39.0–52.0)
Hemoglobin: 13.3 g/dL (ref 13.0–17.0)
POTASSIUM: 3.6 mmol/L (ref 3.5–5.1)
SODIUM: 148 mmol/L — AB (ref 135–145)
TCO2: 27 mmol/L (ref 0–100)

## 2015-01-20 MED ORDER — ACETAMINOPHEN 325 MG PO TABS: 650.0000 mg | ORAL_TABLET | ORAL | Status: DC | PRN

## 2015-01-20 MED ORDER — DIPHENHYDRAMINE HCL 25 MG PO CAPS: 25.0000 mg | ORAL_CAPSULE | Freq: Four times a day (QID) | ORAL | Status: DC | PRN

## 2015-01-20 MED ORDER — SODIUM CHLORIDE 0.9 % IV SOLN
2.0000 g | Freq: Once | INTRAVENOUS | Status: AC
  Administered 2015-01-20: 2 g via INTRAVENOUS
  Filled 2015-01-20: qty 20

## 2015-01-20 MED ORDER — ACD FORMULA A 0.73-2.45-2.2 GM/100ML VI SOLN: 500.0000 mL | Status: DC

## 2015-01-20 MED ORDER — ALBUMIN HUMAN 25 % IV SOLN
INTRAVENOUS | Status: AC
  Administered 2015-01-20 (×3): via INTRAVENOUS_CENTRAL
  Filled 2015-01-20 (×3): qty 200

## 2015-01-20 MED ORDER — ACD FORMULA A 0.73-2.45-2.2 GM/100ML VI SOLN
500.0000 mL | Status: DC
  Administered 2015-01-20: 500 mL via INTRAVENOUS
  Filled 2015-01-20: qty 500

## 2015-01-20 MED ORDER — ACD FORMULA A 0.73-2.45-2.2 GM/100ML VI SOLN
Status: AC
  Administered 2015-01-20: 500 mL via INTRAVENOUS
  Filled 2015-01-20: qty 1000

## 2015-01-20 MED ORDER — HEPARIN SODIUM (PORCINE) 1000 UNIT/ML IJ SOLN
1000.0000 [IU] | Freq: Once | INTRAMUSCULAR | Status: AC
  Administered 2015-01-20: 1000 [IU]
  Filled 2015-01-20: qty 1

## 2015-01-20 NOTE — Progress Notes (Signed)
NIF -40cmH2O

## 2015-01-20 NOTE — Progress Notes (Addendum)
Rehab Admissions Coordinator Note:  Patient was screened by Aaron Bostwick L for appropriateness for an Inpatient Acute Rehab Consult.  At this time, we are recommending Inpatient Rehab consult. We will also need orders for OT eval for insurance purposes.   Craig [name] L 01/20/2015, 10:05 AM  I can be reached at [phone].

## 2015-01-20 NOTE — Progress Notes (Signed)
[place] TEAM 1 - Stepdown/ICU TEAM Progress Note  Craig [name] DHR:[id number] DOB: September 15, 1941 DOA: 01/10/2015 PCP: Ann Held, MD  Admit HPI / Brief Narrative: Craig [name] is a 74 y.o. WM PMHx chronic Atrial Fibrillation on Xarelto, HTN, CHF, and Myasthenia Gravis. Recently admitted for myasthenia gravis crisis and had plasmapheresis was transferred to rehabilitation (discharge from [facility] 2/19 after MG exacerbation requiring intubation/PLEX) On 2/27 patient started developing chest pain which was retrosternal pressure-like lasted for 15 minutes following which he related to his left-sided shoulder. Patient also was diaphoretic but denies any associated shortness of breath. He was given Dilaudid for pain relief following which patient became transiently bradycardic with heart rate into the 40s. Patient presently is chest pain-free. Patient had a PEG tube placed yesterday for his dysphagia secondary to myasthenia gravis. Patient denies any abdominal pain nausea vomiting or diarrhea fever chills. EKG shows atrial fibrillation with nonspecific ST changes. Patient has been admitted for further management of his chest pain. Patient is on Xarelto which has been held for last 2 days for his PEG tube placement.   HPI/Subjective: 3/8 A/O 4, states only issue on this admission was SOB. States currently negative SOB, negative CP. Able to move all extremities on command.   Assessment/Plan: Myasthenia Gravis with Acute Exacerbation  -Neurology following; PLEX per neurology  -S/p IVIG 2/28  - Daily NIF - appears to be slowly improving; 3/8 NIF= -35 -Continue Pyridostigmine 60 mg QID -Kidney prednisone 60 mg daily  Acute Respiratory Failure - in setting of MG exacerbation off daytime NIMV - continues on nocturnal BIPAP - appears stable from respiratory standpoint   C diff collitis v/s colonization of colon  -Cont flagyl tx - continues to have diarrhea, but this could be due to his tube feeds - he  completed a 14 day course of flagyl w/ last dose on 12/24/14 - will plan for tx with an additional 14 days w/ isolated flagyl tx for a possible first recurrence   OSA -on bipap 18/8 at baseline  Chronic Atrial Fibrillation -on Xarelto and oral amio - well controlled   HTN -BP currently well controlled   HLD -Resume lipitor when oral intake resumes   Hypophosphatemia Resolved   Hypokalemia  -Due to GI losses w/ ongoing diarrhea - cont to replace and follow  Metastatic Bladder Cancer -bladder cancer diagnosed in 2014, underwent resection, followed by chemotherapy for metastatic lesion to his abdominal wall, finished chemotherapy in October 2015 - CT 2/11 at CIR with progressive omental / peritoneal metastasis  Chronic Dysphagia  -s/p PEG - bolus feeding at home with reported reflux - now on continuous feeds and tolerating well  - 3/8 patient passed swallow study recommended thin liquid diet. Started full liquid diet.  Possible aspiration PNA v/s atx Stopped broad abx in setting of C diff - no convincing signs/sx of an active pulmonary infection   Nasal Stage II Pressure Ulcer present on admit, was dry scab, open with increase bipap use - appears to be slowly improving - pt denies pain in wound, and feels it is getting better   Uncontrolled DM  -CBG variable - follow w/o change today    Code Status: FULL Family Communication: no family present at time of exam Disposition Plan: Per neurology    Consultants: Dr. Roland Rack (Neurology) Dr. Merrie Roof (PCCM)   Procedure/Significant Events: 2/27 Admit with worsening SOB, fatigue and low O2 saturations. NIF -30, -22, -20. VC 450, 700, 610 ml 2/28 PCCM consulted for evaluation  of worsening respiratory status. NIF -40, VC 1.6L 2/29 CXR: Rt basilar atx 3/3 no daytime NIMV needed   Culture NA  Antibiotics: NA  DVT prophylaxis: Xarelto   Devices NA  LINES / TUBES:  NA    Continuous  Infusions: . sodium chloride 10 mL/hr at 01/18/15 1734  . citrate dextrose    . feeding supplement (JEVITY 1.2 CAL) 1,000 mL (01/20/15 1253)    Objective: VITAL SIGNS: Temp: 97.7 F (36.5 C) (03/08 2011) Temp Source: Axillary (03/08 2011) BP: 115/59 mmHg (03/08 2011) Pulse Rate: 86 (03/08 2011) SPO2; FIO2:   Intake/Output Summary (Last 24 hours) at 01/20/15 2123 Last data filed at 01/20/15 2100  Gross per 24 hour  Intake   2810 ml  Output    775 ml  Net   2035 ml     Exam: General:A /O 4, NAD, No acute respiratory distress, moves all extremities to command Lungs: Clear to auscultation bilaterally without wheezes or crackles Cardiovascular: Regular rate and rhythm without murmur gallop or rub normal S1 and S2 Abdomen: Nontender, nondistended, soft, bowel sounds positive, no rebound, no ascites, no appreciable mass Extremities: No significant cyanosis, clubbing, or edema bilateral lower extremities  Data Reviewed: Basic Metabolic Panel:  Recent Labs Lab 01/16/15 0409  01/17/15 0308 01/18/15 0330 01/18/15 2033 01/19/15 0130 01/20/15 01/20/15 0954  NA 143  < > 143 144 145 144 146* 148*  K 3.4*  < > 3.5 3.0* 3.4* 3.5 3.5 3.6  CL 108  < > 109 106 101 111 111 104  CO2 31  --  28 34*  --  30 32  --   GLUCOSE 142*  < > 136* 125* 177* 175* 133* 92  BUN 16  < > 14 15 18 15 17 17   CREATININE 0.62  < > 0.58 0.51 0.70 0.52 0.56 0.80  CALCIUM 8.9  --  8.9 8.8  --  9.0 8.9  --   MG  --   --  1.7 1.7  --   --   --   --   PHOS  --   --   --  3.1  --   --   --   --   < > = values in this interval not displayed. Liver Function Tests:  Recent Labs Lab 01/18/15 0330  AST 25  ALT 31  ALKPHOS 33*  BILITOT 0.5  PROT 4.2*  ALBUMIN 3.3*    Recent Labs Lab 01/18/15 0330  LIPASE 146*   No results for input(s): AMMONIA in the last 168 hours. CBC:  Recent Labs Lab 01/14/15 0500  01/15/15 0505 01/16/15 1050 01/16/15 1059 01/18/15 0250 01/18/15 2033 01/20/15 0954    WBC 10.7*  --  13.5*  --  13.2* 14.6*  --   --   NEUTROABS 8.4*  --   --   --   --   --   --   --   HGB 12.2*  < > 11.9* 13.3 12.5* 12.4* 13.6 13.3  HCT 37.5*  < > 36.4* 39.0 38.1* 37.6* 40.0 39.0  MCV 94.7  --  93.8  --  94.1 92.6  --   --   PLT 154  --  155  --  171 180  --   --   < > = values in this interval not displayed. Cardiac Enzymes: No results for input(s): CKTOTAL, CKMB, CKMBINDEX, TROPONINI in the last 168 hours. BNP (last 3 results)  Recent Labs  12/28/14 2215 01/10/15 1630  BNP 250.1* 76.3    ProBNP (last 3 results) No results for input(s): PROBNP in the last 8760 hours.  CBG:  Recent Labs Lab 01/19/15 2347 01/20/15 0345 01/20/15 0828 01/20/15 1616 01/20/15 2056  GLUCAP 178* 130* 88 177* 213*    Recent Results (from the past 240 hour(s))  MRSA PCR Screening     Status: None   Collection Time: 01/11/15 12:58 AM  Result Value Ref Range Status   MRSA by PCR NEGATIVE NEGATIVE Final    Comment:        The GeneXpert MRSA Assay (FDA approved for NASAL specimens only), is one component of a comprehensive MRSA colonization surveillance program. It is not intended to diagnose MRSA infection nor to guide or monitor treatment for MRSA infections.   Clostridium Difficile by PCR     Status: Abnormal   Collection Time: 01/12/15  3:01 PM  Result Value Ref Range Status   C difficile by pcr POSITIVE (A) NEGATIVE Final    Comment: CRITICAL RESULT CALLED TO, READ BACK BY AND VERIFIED WITH: T. CRITE RN 17:00 01/12/15 ([email or web address])   Culture, Urine     Status: None   Collection Time: 01/13/15  3:44 PM  Result Value Ref Range Status   Specimen Description URINE, CATHETERIZED  Final   Special Requests NONE  Final   Colony Count NO GROWTH Performed at [facility]   Final   Culture NO GROWTH Performed at [facility]   Final   Report Status 01/14/2015 FINAL  Final  Clostridium Difficile by PCR     Status: Abnormal   Collection Time: 01/15/15   4:59 PM  Result Value Ref Range Status   C difficile by pcr POSITIVE (A) NEGATIVE Final    Comment: CRITICAL RESULT CALLED TO, READ BACK BY AND VERIFIED WITH: Brendolyn Patty RN 9:58 01/16/15 ([email or web address])      Studies:  Recent x-ray studies have been reviewed in detail by the Attending Physician  Scheduled Meds:  Scheduled Meds: . albuterol  2.5 mg Nebulization TID  . amiodarone  100 mg Per Tube Daily  . antiseptic oral rinse  7 mL Mouth Rinse q12n4p  . chlorhexidine  15 mL Mouth Rinse BID  . famotidine  40 mg Per Tube Daily  . feeding supplement (PRO-STAT SUGAR FREE 64)  30 mL Per Tube Daily  . insulin aspart  0-15 Units Subcutaneous 6 times per day  . insulin aspart  4 Units Subcutaneous 6 times per day  . insulin glargine  22 Units Subcutaneous Daily  . metroNIDAZOLE  500 mg Per Tube 3 times per day  . mupirocin ointment   Topical Daily  . potassium chloride  20 mEq Per Tube BID  . predniSONE  60 mg Per Tube Q breakfast  . pyridostigmine  60 mg Oral 4 times per day  . rivaroxaban  20 mg Per Tube Q supper    Time spent on care of this patient: 40 mins   Allie Bossier [facility]  [facility] Office  [phone] Pager - [phone]  On-Call/Text Page:      [email or web address]      password [email or web address]  If 7PM-7AM, please contact night-coverage [email or web address] Password [email or web address] 01/20/2015, 9:23 PM   LOS: 10 days   Care during the described time interval was provided by me .  I have reviewed this patient's available data, including medical history, events of note, physical examination, radiology studies and test results as part of my evaluation  Dia Crawford, MD  [phone] Pager

## 2015-01-20 NOTE — Clinical Social Work Psychosocial (Signed)
Clinical Social Work Department BRIEF PSYCHOSOCIAL ASSESSMENT 01/20/2015  Patient:  Craig [name], Craig [name]     Account Number:  [id number]     [place] date:  01/10/2015  Clinical Social Worker:  Domenica Reamer, [place]  Date/Time:  01/19/2015 04:24 PM  Referred by:  Physician  Date Referred:  01/19/2015 Referred for  SNF Placement   Other Referral:   Interview type:  Patient Other interview type:   also spoke with wife at bedsid    PSYCHOSOCIAL DATA Living Status:  WIFE Admitted from facility:   Level of care:   Primary support name:  Luster Landsberg Primary support relationship to patient:  SPOUSE Degree of support available:   high level of support    CURRENT CONCERNS Current Concerns  Post-Acute Placement   Other Concerns:    SOCIAL WORK ASSESSMENT / PLAN CSW spoke with patient and wife at bedside about PT recommendation for SNF- patient and wife unaware of recommendation.  CSW explained that due to patient decreased mobility that they were recommending going to a SNF prior to going home since he is currently unable to ambulate.  Pt and wife expressed confusion about these recommendations since this was the first they had heard of it- stated that pt has improved drastically and that wife feels as if she can handle his needs at home as she has done in the past.   Assessment/plan status:  Psychosocial Support/Ongoing Assessment of Needs Other assessment/ plan:   FL2   Information/referral to community resources:    PATIENT'S/FAMILY'S RESPONSE TO PLAN OF CARE: Patient and wife are not agreeable to placement at this time unless PT continues to strongly recommend placement- wife would feel better having pt at home.       Domenica Reamer, [place] Social Worker [phone]

## 2015-01-20 NOTE — Progress Notes (Addendum)
Subjective: Patient feels slightly stronger.  Receiving last PLX today.   Objective: Current vital signs: BP 112/73 mmHg  Pulse 68  Temp(Src) 96.3 F (35.7 C) (Axillary)  Resp 22  Ht 5\' 7"  (1.702 m)  Wt 106.3 kg (234 lb 5.6 oz)  BMI 36.70 kg/m2  SpO2 98% Vital signs in last 24 hours: Temp:  [96.3 F (35.7 C)-98.2 F (36.8 C)] 96.3 F (35.7 C) (03/08 0830) Pulse Rate:  [68-89] 68 (03/08 0830) Resp:  [15-22] 22 (03/08 0830) BP: (101-115)/(47-73) 112/73 mmHg (03/08 0830) SpO2:  [94 %-98 %] 98 % (03/08 0830) Weight:  [106.3 kg (234 lb 5.6 oz)] 106.3 kg (234 lb 5.6 oz) (03/08 0500)  Intake/Output from previous day: 03/07 0701 - 03/08 0700 In: 825 [I.V.:110; NG/GT:715] Out: 375 [Urine:375] Intake/Output this shift:   Nutritional status: Diet NPO time specified  Neurologic Exam: General: Mental Status: Alert, oriented, thought content appropriate.  Speech fluent without evidence of aphasia.  Able to follow 3 step commands without difficulty. Cranial Nerves: II:  Visual fields grossly normal, pupils equal, round, reactive to light and accommodation III,IV, VI: ptosis not present, extra-ocular motions intact bilaterally V,VII: smile symmetric, facial light touch sensation normal bilaterally VIII: hearing normal bilaterally IX,X: uvula rises symmetrically XI: bilateral shoulder shrug XII: midline tongue extension without atrophy or fasciculations  Motor: Right : Upper extremity   4-/5    Left:     Upper extremity   4-/5 Lifts bilateral arms off bed to 45 degrees   Lower extremity   4/5     Lower extremity   4/5 Tone and bulk:normal tone throughout; no atrophy noted Sensory: Pinprick and light touch intact throughout, bilaterally Deep Tendon Reflexes:  Depressed 1+ throughout.      Lab Results: Basic Metabolic Panel:  Recent Labs Lab 01/16/15 0409  01/17/15 0308 01/18/15 0330 01/18/15 2033 01/19/15 0130 01/20/15  NA 143  < > 143 144 145 144 146*  K 3.4*  < >  3.5 3.0* 3.4* 3.5 3.5  CL 108  < > 109 106 101 111 111  CO2 31  --  28 34*  --  30 32  GLUCOSE 142*  < > 136* 125* 177* 175* 133*  BUN 16  < > 14 15 18 15 17   CREATININE 0.62  < > 0.58 0.51 0.70 0.52 0.56  CALCIUM 8.9  --  8.9 8.8  --  9.0 8.9  MG  --   --  1.7 1.7  --   --   --   PHOS  --   --   --  3.1  --   --   --   < > = values in this interval not displayed.  Liver Function Tests:  Recent Labs Lab 01/18/15 0330  AST 25  ALT 31  ALKPHOS 33*  BILITOT 0.5  PROT 4.2*  ALBUMIN 3.3*    Recent Labs Lab 01/18/15 0330  LIPASE 146*   No results for input(s): AMMONIA in the last 168 hours.  CBC:  Recent Labs Lab 01/14/15 0500  01/15/15 0505 01/16/15 1050 01/16/15 1059 01/18/15 0250 01/18/15 2033  WBC 10.7*  --  13.5*  --  13.2* 14.6*  --   NEUTROABS 8.4*  --   --   --   --   --   --   HGB 12.2*  < > 11.9* 13.3 12.5* 12.4* 13.6  HCT 37.5*  < > 36.4* 39.0 38.1* 37.6* 40.0  MCV 94.7  --  93.8  --  94.1 92.6  --   PLT 154  --  155  --  171 180  --   < > = values in this interval not displayed.  Cardiac Enzymes: No results for input(s): CKTOTAL, CKMB, CKMBINDEX, TROPONINI in the last 168 hours.  Lipid Panel: No results for input(s): CHOL, TRIG, HDL, CHOLHDL, VLDL, LDLCALC in the last 168 hours.  CBG:  Recent Labs Lab 01/19/15 1601 01/19/15 2032 01/19/15 2347 01/20/15 0345 01/20/15 0828  GLUCAP 235* 223* 178* 130* 88    Microbiology: Results for orders placed or performed during the hospital encounter of 01/10/15  MRSA PCR Screening     Status: None   Collection Time: 01/11/15 12:58 AM  Result Value Ref Range Status   MRSA by PCR NEGATIVE NEGATIVE Final    Comment:        The GeneXpert MRSA Assay (FDA approved for NASAL specimens only), is one component of a comprehensive MRSA colonization surveillance program. It is not intended to diagnose MRSA infection nor to guide or monitor treatment for MRSA infections.   Clostridium Difficile by PCR      Status: Abnormal   Collection Time: 01/12/15  3:01 PM  Result Value Ref Range Status   C difficile by pcr POSITIVE (A) NEGATIVE Final    Comment: CRITICAL RESULT CALLED TO, READ BACK BY AND VERIFIED WITH: T. CRITE RN 17:00 01/12/15 ([email or web address])   Culture, Urine     Status: None   Collection Time: 01/13/15  3:44 PM  Result Value Ref Range Status   Specimen Description URINE, CATHETERIZED  Final   Special Requests NONE  Final   Colony Count NO GROWTH Performed at [facility]   Final   Culture NO GROWTH Performed at [facility]   Final   Report Status 01/14/2015 FINAL  Final  Clostridium Difficile by PCR     Status: Abnormal   Collection Time: 01/15/15  4:59 PM  Result Value Ref Range Status   C difficile by pcr POSITIVE (A) NEGATIVE Final    Comment: CRITICAL RESULT CALLED TO, READ BACK BY AND VERIFIED WITH: Brendolyn Patty RN 9:58 01/16/15 ([email or web address])     Coagulation Studies: No results for input(s): LABPROT, INR in the last 72 hours.  Imaging: No results found.  Medications:  Scheduled: . albuterol  2.5 mg Nebulization TID  . amiodarone  100 mg Per Tube Daily  . antiseptic oral rinse  7 mL Mouth Rinse q12n4p  . calcium gluconate IVPB  2 g Intravenous Once  . chlorhexidine  15 mL Mouth Rinse BID  . citrate dextrose      . famotidine  40 mg Per Tube Daily  . feeding supplement (PRO-STAT SUGAR FREE 64)  30 mL Per Tube Daily  . heparin  1,000 Units Intracatheter Once  . insulin aspart  0-15 Units Subcutaneous 6 times per day  . insulin aspart  4 Units Subcutaneous 6 times per day  . insulin glargine  22 Units Subcutaneous Daily  . metroNIDAZOLE  500 mg Per Tube 3 times per day  . mupirocin ointment   Topical Daily  . potassium chloride  20 mEq Per Tube BID  . predniSONE  60 mg Per Tube Q breakfast  . pyridostigmine  60 mg Oral 4 times per day  . rivaroxaban  20 mg Per Tube Q supper    Assessment/Plan:  74 yo M with SOB in the setting of myasthenia gravis.  He  has received 5 courses of PLX as of  today and feels slightly stronger.  Most recent NIF has been -5 and -35.  Has good cough.  Only can count to 8 in single breath.  At this time would continue his pyridostigmine and prednisone.    Recommend: 1) Continued daily NIF and VC 2) discussed with primary team. Looking for placement. Will S/O for now. Please call with any questions.     Etta Quill PA-C [facility] [phone]  01/20/2015, 9:46 AM

## 2015-01-20 NOTE — Progress Notes (Signed)
NIF -35  

## 2015-01-20 NOTE — Progress Notes (Signed)
TPE 5/5 completed without issue. Pt tolerated very well. Remains on 3L [place] sats 98-100%. Report called to primary RN

## 2015-01-20 NOTE — Progress Notes (Signed)
PT Cancellation Note  Patient Details Name: Craig [name] MRN: [id number] DOB: 1941/09/16   Cancelled Treatment:    Reason Eval/Treat Not Completed: Patient at procedure or test/unavailable   Duncan Dull 01/20/2015, 1:44 PM Alben Deeds, [place] DPT  [phone]

## 2015-01-20 NOTE — Clinical Social Work Note (Addendum)
CSW spoke with patient and patient wife at bedside.  CSW spoke with them concerning recommendation for SNF placement- patient states that he has not been spoken to about this option and feels as if he has improved enough to go home with his wife.  Pt wife states that she has taken care of him at home before and that she feels comfortable taking him home this time.  CSW will continue to follow in case PT/OT continues to recommend skilled care.  Domenica Reamer, [place] Social Worker [phone]

## 2015-01-20 NOTE — Progress Notes (Signed)
Speech Language Pathology Treatment:    Patient Details Name: Craig [name] MRN: [id number] DOB: [birth year] Today's Date: 01/20/2015 Time: 8832-5498 SLP Time Calculation (min) (ACUTE ONLY): 17 min  Assessment / Plan / Recommendation Clinical Impression  Pt demonstrated great improvement since last session. Pt was given PO trials of ice chips and small sips of thin liquids which did not elicit s/s of aspiration. Pt swallowed approximately 4-5 times following teaspoon amounts of purees. Given that pt's prior MBS revealed moderate-severe oropharyngeal dysphagia characterized by decreased base of tongue retraction, reduced hyolaryngeal excursion and reduced pharyngeal constriction, and pt's need to swallow multiple times following PO trials with purees, recommend an objective swallow study to determine safest diet. Speech will follow-up with MBS findings.   HPI HPI: 73 y/o M, never smoker, transferred to [facility] on 1/22 with acute MG exacerbation & respiratory failure. Decompensated requiring intubation 1/22-1/25.  The patient was subsequently transferred to the rehab unit for several weeks of therapy and was home for approximately a week prior to readmission for trouble breathing.  The patient's wife reports that [facility] had seen the patient for an evaluation only and that therapy had not begun.  The patient is currently NPO receiving continuous PEG feeds.   Pertinent Vitals    SLP Plan  MBS    Recommendations Diet recommendations: NPO Medication Administration: Via alternative means              Oral Care Recommendations: Oral care Q4 per protocol Plan: MBS    Craig     [name], Craig [name] 01/20/2015, 8:50 AM

## 2015-01-20 NOTE — Procedures (Signed)
Objective Swallowing Evaluation: Modified Barium Swallowing Study  Patient Details  Name: Craig [name] MRN: [id number] Date of Birth: 1941-06-02  Today's Date: 01/20/2015 Time: SLP Start Time (ACUTE ONLY): 1340-SLP Stop Time (ACUTE ONLY): 1400 SLP Time Calculation (min) (ACUTE ONLY): 20 min  Past Medical History:  Past Medical History  Diagnosis Date  . Atrial fibrillation, chronic     on Xarelto   . HTN (hypertension)   . CHF (congestive heart failure)   . Diabetes mellitus   . 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 Myasthenia gravis    Past Surgical History:  Past Surgical History  Procedure Laterality Date  . Abdominal wall metastasis removal     HPI:  HPI: [name], never smoker, transferred to [facility] on 1/22 with acute MG exacerbation & respiratory failure. Decompensated requiring intubation 1/22-1/25.  The patient was subsequently transferred to the rehab unit for several weeks of therapy and was home for approximately a week prior to readmission for trouble breathing.  The patient's wife reports that [facility] had seen the patient for an evaluation only and that therapy had not begun.  The patient is currently NPO receiving continuous PEG feeds.  No Data Recorded  Assessment / Plan / Recommendation CHL IP CLINICAL IMPRESSIONS 01/20/2015  Dysphagia Diagnosis (None)  Clinical impression Pt presents with mild oral dysphagia; pt used backward head tilt intermittently to propel bolus to the back of oral cavity. Pharyngeal phase dysphagia characterized by base of tongue weakness, and delayed swallow initiation. No aspiration or penetration observed across consistencies. Recommend pt initiate regular/ thin liquid diet. Recommend consult with dietician to determine need for continued PEG tube feed as oral intake improves. Speech will follow-up for diet tolerance.       CHL IP TREATMENT RECOMMENDATION 01/20/2015  Treatment Plan  Recommendations Therapy as outlined in treatment plan below     CHL IP DIET RECOMMENDATION 01/20/2015  Diet Recommendations Regular;Thin liquid  Liquid Administration via Cup;Straw  Medication Administration Whole meds with puree  Compensations Slow rate;Small sips/bites  Postural Changes and/or Swallow Maneuvers Seated upright 90 degrees     CHL IP OTHER RECOMMENDATIONS 01/20/2015  Recommended Consults (None)  Oral Care Recommendations Oral care Q4 per protocol  Other Recommendations (None)     CHL IP FOLLOW UP RECOMMENDATIONS 01/20/2015  Follow up Recommendations Inpatient Rehab     CHL IP FREQUENCY AND DURATION 01/20/2015  Speech Therapy Frequency (ACUTE ONLY) min 2x/week  Treatment Duration 2 weeks     Pertinent Vitals/Pain NA    SLP Swallow Goals No flowsheet data found.  No flowsheet data found.    CHL IP REASON FOR REFERRAL 12/25/2014  Reason for Referral Objectively evaluate swallowing function     CHL IP ORAL PHASE 01/20/2015  Lips (None)  Tongue (None)  Mucous membranes (None)  Nutritional status (None)  Other (None)  Oxygen therapy (None)  Oral Phase Impaired  Oral - Pudding Teaspoon (None)  Oral - Pudding Cup (None)  Oral - Honey Teaspoon NT  Oral - Honey Cup (None)  Oral - Honey Syringe (None)  Oral - Nectar Teaspoon NT  Oral - Nectar Cup (None)  Oral - Nectar Straw (None)  Oral - Nectar Syringe (None)  Oral - Ice Chips (None)  Oral - Thin Teaspoon NT  Oral - Thin Cup Delayed oral transit  Oral - Thin Straw Delayed oral transit  Oral - Thin Syringe NT  Oral - Puree Delayed oral  transit  Oral - Mechanical Soft (None)  Oral - Regular Delayed oral transit  Oral - Multi-consistency (None)  Oral - Pill (None)  Oral Phase - Comment (None)      CHL IP PHARYNGEAL PHASE 01/20/2015  Pharyngeal Phase Impaired  Pharyngeal - Pudding Teaspoon (None)  Penetration/Aspiration details (pudding teaspoon) (None)  Pharyngeal - Pudding Cup (None)  Penetration/Aspiration  details (pudding cup) (None)  Pharyngeal - Honey Teaspoon NT  Penetration/Aspiration details (honey teaspoon) (None)  Pharyngeal - Honey Cup (None)  Penetration/Aspiration details (honey cup) (None)  Pharyngeal - Honey Syringe (None)  Penetration/Aspiration details (honey syringe) (None)  Pharyngeal - Nectar Teaspoon NT  Penetration/Aspiration details (nectar teaspoon) (None)  Pharyngeal - Nectar Cup (None)  Penetration/Aspiration details (nectar cup) (None)  Pharyngeal - Nectar Straw (None)  Penetration/Aspiration details (nectar straw) (None)  Pharyngeal - Nectar Syringe (None)  Penetration/Aspiration details (nectar syringe) (None)  Pharyngeal - Ice Chips (None)  Penetration/Aspiration details (ice chips) (None)  Pharyngeal - Thin Teaspoon NT  Penetration/Aspiration details (thin teaspoon) (None)  Pharyngeal - Thin Cup Delayed swallow initiation;Pharyngeal residue - valleculae  Penetration/Aspiration details (thin cup) (None)  Pharyngeal - Thin Straw Delayed swallow initiation;Pharyngeal residue - valleculae  Penetration/Aspiration details (thin straw) (None)  Pharyngeal - Thin Syringe (None)  Penetration/Aspiration details (thin syringe') (None)  Pharyngeal - Puree Delayed swallow initiation;Pharyngeal residue - valleculae  Penetration/Aspiration details (puree) (None)  Pharyngeal - Mechanical Soft (None)  Penetration/Aspiration details (mechanical soft) (None)  Pharyngeal - Regular Delayed swallow initiation;Pharyngeal residue - valleculae  Penetration/Aspiration details (regular) (None)  Pharyngeal - Multi-consistency (None)  Penetration/Aspiration details (multi-consistency) (None)  Pharyngeal - Pill (None)  Penetration/Aspiration details (pill) (None)  Pharyngeal Comment (None)     CHL IP CERVICAL ESOPHAGEAL PHASE 01/20/2015  Cervical Esophageal Phase WFL  Pudding Teaspoon (None)  Pudding Cup (None)  Honey Teaspoon (None)  Honey Cup (None)  Honey Syringe (None)   Nectar Teaspoon (None)  Nectar Cup (None)  Nectar Straw (None)  Nectar Syringe (None)  Thin Teaspoon (None)  Thin Cup (None)  Thin Straw (None)  Thin Syringe (None)  Cervical Esophageal Comment (None)    No flowsheet data found.  Rodena Goldmann, SLP Student      Supervised and reviewed by Herbie Baltimore MA CCC-SLP  Travonte Byard, Katherene Ponto 01/20/2015, 2:43 PM

## 2015-01-20 NOTE — Progress Notes (Signed)
Pt resting comfortably. No CPT (flutter) performed.

## 2015-01-20 NOTE — Progress Notes (Signed)
Pt refused Bipap for tonight due to skin irritation on nose. Pt was encouraged to contact RT if he changes his mind. Pt remains on 3Lpm nasal cannula SpO2 97%. RT will continue to monitor as needed.

## 2015-01-20 NOTE — Progress Notes (Signed)
OT Cancellation Note  Patient Details Name: Craig [name] MRN: [id number] DOB: 1941/07/30   Cancelled Treatment:    Reason Eval/Treat Not Completed: Patient at procedure or test/ unavailable (MBS)  [place], OTR/L  [phone] 01/20/2015 01/20/2015, 3:30 PM

## 2015-01-20 NOTE — Consult Note (Signed)
Physical Medicine and Rehabilitation Consult   Reason for Consult: SOB with fatigue due to MG exacerbation Referring Physician: Dr. Sherral Hammers.    HPI: Craig [name] is a 74 y.o. male with history of AFib, bladder cancer, DM type 2, MG with recent flare who was recently discharged to home past CIR on 01/02/15. He was seen by outpatient neurologist on 02/26 with plans to start home IVIG but was readmitted on 01/10/15 with fatigue,  worsening of SOB and hypoxia. HD  Cath placed by CCM on 02/28 and neurology recommended IVIG X 5 treatments.  CCM consulted for close monitoring of respiratory status due to concerns of requiring intubation and he was started on IV zosyn for PNA. He developed diarrhea with recurrent c diff and was started on metronidazole on 03/05.  He has had neck discomfort due to HD cath requiring IV fentanyl for pain management. ST evaluation done yesterday and patient started on trials of ice chips with plans for objective swallow evaluation.  He has completed his treatment course of IVIG today and is reporting increase  in strength as well as swallow function. MBS done today and patient started on regular diet with thin liquids. Therapy ongoing and patient showing improvement in activity tolerance. CIR recommended by MD and Rehab team.    Review of Systems  Eyes: Positive for blurred vision.  Respiratory: Positive for cough.   Neurological: Positive for focal weakness.      Past Medical History  Diagnosis Date  . Atrial fibrillation, chronic     on Xarelto   . HTN (hypertension)   . CHF (congestive heart failure)   . Diabetes mellitus   . 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 Myasthenia gravis     Past Surgical History  Procedure Laterality Date  . Abdominal wall metastasis removal      Family History  Problem Relation Age of Onset  . Colon cancer Mother   . Heart attack Father   . Diabetes Father      Social History:  reports that he has never smoked. He does not have any smokeless tobacco history on file. He reports that he does not drink alcohol or use illicit drugs.    Allergies  Allergen Reactions  . Ibuprofen Other (See Comments)    Interacts with another medication patient is taking    Medications Prior to Admission  Medication Sig Dispense Refill  . amiodarone (PACERONE) 100 MG tablet Place 1 tablet (100 mg total) into feeding tube daily.    Marland Kitchen antiseptic oral rinse (CPC / CETYLPYRIDINIUM CHLORIDE 0.05%) 0.05 % LIQD solution 7 mLs by Mouth Rinse route 2 (two) times daily. 946 mL 1  . artificial tears (LACRILUBE) OINT ophthalmic ointment Place into both eyes every 4 (four) hours as needed for dry eyes.  0  . atorvastatin (LIPITOR) 40 MG tablet Place 1 tablet (40 mg total) into feeding tube daily.    . cephALEXin (KEFLEX) 250 MG/5ML suspension Place 5 mLs (250 mg total) into feeding tube every 8 (eight) hours. 35 mL 0  . famotidine (PEPCID) 40 MG/5ML suspension Take 5 mLs (40 mg total) by mouth daily. 150 mL 0  . insulin aspart (NOVOLOG FLEXPEN) 100 UNIT/ML FlexPen Inject 0-10 Units into the skin 4 (four) times daily. Use according to scale prior to tube feeds. 15 mL 1  . ipratropium (ATROVENT HFA) 17 MCG/ACT inhaler Inhale 1 puff into the lungs every  6 (six) hours as needed for wheezing.    Marland Kitchen ipratropium (ATROVENT) 0.02 % nebulizer solution Inhale 2.5 mLs (0.5 mg total) into the lungs every 6 (six) hours as needed (wHEEZING.). 75 mL 12  . metoprolol tartrate (LOPRESSOR) 25 MG tablet Place 0.5 tablets (12.5 mg total) into feeding tube daily.    . nateglinide (STARLIX) 120 MG tablet Place 1 tablet (120 mg total) into feeding tube 3 (three) times daily. 90 tablet 1  . Nutritional Supplements (FEEDING SUPPLEMENT, JEVITY 1.2 CAL,) LIQD Place 390 mLs into feeding tube 4 (four) times daily -  with meals and at bedtime.  0  . potassium chloride 20 MEQ/15ML (10%) SOLN Place 15 mLs (20 mEq  total) into feeding tube 3 (three) times daily. 3800 mL 0  . predniSONE (DELTASONE) 20 MG tablet Place 3 tablets (60 mg total) into feeding tube daily with breakfast. 150 tablet 1  . pyridostigmine (MESTINON) 60 MG tablet 1.5 tablets (90 mg total) by Per NG tube route 4 (four) times daily. 135 tablet 1  . rivaroxaban (XARELTO) 20 MG TABS tablet Take 1 tablet (20 mg total) by mouth daily with supper. (Patient taking differently: Place 20 mg into feeding tube daily with supper. )    . chlorhexidine (PERIDEX) 0.12 % solution 15 mLs by Mouth Rinse route 2 (two) times daily. 120 mL 0  . ondansetron (ZOFRAN) 4 MG tablet Take 1 tablet (4 mg total) by mouth every 6 (six) hours as needed for nausea. 20 tablet 0  . senna (SENOKOT) 8.6 MG TABS tablet Place 1 tablet (8.6 mg total) into feeding tube daily as needed for mild constipation. (Patient not taking: Reported on 01/10/2015) 120 each 0  . Water For Irrigation, Sterile (FREE WATER) SOLN Place 150 mLs into feeding tube 4 (four) times daily - after meals and at bedtime.      Home: Home Living Family/patient expects to be discharged to:: Unsure Living Arrangements: Spouse/significant other Available Help at Discharge: Family, Available 24 hours/day Type of Home: House Home Access: Stairs to enter [employer] of Steps: 3 Entrance Stairs-Rails: Right Home Layout: One level, Laundry or work area in [place]: Environmental consultant - 2 wheels, Radio producer - single point, Bedside commode, Shower seat  Lives With: Spouse  Functional History: Prior Function Level of Independence: Needs assistance Gait / Transfers Assistance Needed: RW for gait ADL's / Homemaking Assistance Needed: needs assist to bath. Comments: Infor represents time since returning home from previous CIR stay. Functional Status:  Mobility: Bed Mobility Overal bed mobility: Needs Assistance Bed Mobility: Supine to Sit, Sit to Supine Supine to sit: Min guard, HOB elevated Sit to  supine: Min guard General bed mobility comments: No physical assist required, patient able to come to EOB quickly, min guard for safety wikth lines Transfers Overall transfer level: Needs assistance Equipment used: Rolling walker (2 wheeled), 2 person hand held assist Transfers: Sit to/from Stand, Stand Pivot Transfers Sit to Stand: Min assist, +2 physical assistance Stand pivot transfers: Min guard General transfer comment: min assist to elevate to standing, performed x4 during session with extended static standing trials. Vcs for hand placement and safety       ADL:    Cognition: Cognition Overall Cognitive Status: Impaired/Different from baseline Orientation Level: Oriented X4 Cognition Arousal/Alertness: Awake/alert Behavior During Therapy: Flat affect Overall Cognitive Status: Impaired/Different from baseline Area of Impairment: Safety/judgement, Problem solving Safety/Judgement: Decreased awareness of safety Problem Solving: Slow processing, Requires verbal cues, Requires tactile cues  Blood pressure  111/70, pulse 72, temperature 96.6 F (35.9 C), temperature source Oral, resp. rate 21, height 5\' 7"  (1.702 m), weight 106.3 kg (234 lb 5.6 oz), SpO2 94 %. Physical Exam  Constitutional: He appears well-developed and well-nourished.  HENT:  Head: Normocephalic.  Skin breakdown over nose  Cardiovascular: Normal rate.   Respiratory: Effort normal.  Neurological:  Speech clearer. Has better oral-motor control. Mild ptosis and dysarthria still. UES: grossly 4/5 prox to distal. LE: 2/5 prox to 3/5 distally. No sensory deficits.  Psychiatric: He has a normal mood and affect. His behavior is normal.    Results for orders placed or performed during the hospital encounter of 01/10/15 (from the past 24 hour(s))  Glucose, capillary     Status: Abnormal   Collection Time: 01/19/15 12:15 PM  Result Value Ref Range   Glucose-Capillary 182 (H) 70 - 99 mg/dL  Glucose, capillary      Status: Abnormal   Collection Time: 01/19/15  4:01 PM  Result Value Ref Range   Glucose-Capillary 235 (H) 70 - 99 mg/dL   Comment 1 Notify RN   Glucose, capillary     Status: Abnormal   Collection Time: 01/19/15  8:32 PM  Result Value Ref Range   Glucose-Capillary 223 (H) 70 - 99 mg/dL  Glucose, capillary     Status: Abnormal   Collection Time: 01/19/15 11:47 PM  Result Value Ref Range   Glucose-Capillary 178 (H) 70 - 99 mg/dL  Basic metabolic panel     Status: Abnormal   Collection Time: 01/20/15 12:00 AM  Result Value Ref Range   Sodium 146 (H) 135 - 145 mmol/L   Potassium 3.5 3.5 - 5.1 mmol/L   Chloride 111 96 - 112 mmol/L   CO2 32 19 - 32 mmol/L   Glucose, Bld 133 (H) 70 - 99 mg/dL   BUN 17 6 - 23 mg/dL   Creatinine, Ser 0.56 0.50 - 1.35 mg/dL   Calcium 8.9 8.4 - 10.5 mg/dL   GFR calc non Af Amer >90 >90 mL/min   GFR calc Af Amer >90 >90 mL/min   Anion gap 3 (L) 5 - 15  Glucose, capillary     Status: Abnormal   Collection Time: 01/20/15  3:45 AM  Result Value Ref Range   Glucose-Capillary 130 (H) 70 - 99 mg/dL  Glucose, capillary     Status: None   Collection Time: 01/20/15  8:28 AM  Result Value Ref Range   Glucose-Capillary 88 70 - 99 mg/dL   No results found.  Assessment/Plan: Diagnosis: myasthenia gravis with crisis 1. Does the need for close, 24 hr/day medical supervision in concert with the patient's rehab needs make it unreasonable for this patient to be served in a less intensive setting? Yes 2. Co-Morbidities requiring supervision/potential complications: c diff, afib, respiratory failure 3. Due to bladder management, bowel management, safety, skin/wound care, disease management, medication administration, pain management and patient education, does the patient require 24 hr/day rehab nursing? Yes 4. Does the patient require coordinated care of a physician, rehab nurse, PT (1-2 hrs/day, 5 days/week), OT (1-2 hrs/day, 5 days/week) and SLP (1-2 hrs/day, 5  days/week) to address physical and functional deficits in the context of the above medical diagnosis(es)? Yes Addressing deficits in the following areas: balance, endurance, locomotion, strength, transferring, bowel/bladder control, bathing, dressing, feeding, grooming, toileting, speech, swallowing and psychosocial support 5. Can the patient actively participate in an intensive therapy program of at least 3 hrs of therapy per day at least 5  days per week? Yes 6. The potential for patient to make measurable gains while on inpatient rehab is excellent 7. Anticipated functional outcomes upon discharge from inpatient rehab are modified independent and supervision  with PT, modified independent and supervision with OT, modified independent and supervision with SLP. 8. Estimated rehab length of stay to reach the above functional goals is: 7 days 9. Does the patient have adequate social supports and living environment to accommodate these discharge functional goals? Yes 10. Anticipated D/C setting: Home 11. Anticipated post D/C treatments: [place] therapy 12. Overall Rehab/Functional Prognosis: excellent  RECOMMENDATIONS: This patient's condition is appropriate for continued rehabilitative care in the following setting: CIR Patient has agreed to participate in recommended program. Yes Note that insurance prior authorization may be required for reimbursement for recommended care.  Comment: Rehab Admissions Coordinator to follow up.  Thanks,  Meredith Staggers, MD, Mellody Drown     01/20/2015

## 2015-01-20 NOTE — Progress Notes (Signed)
Physical Therapy Treatment Patient Details Name: Craig [name] MRN: [id number] DOB: 27-Dec-1940 Today's Date: 01/20/2015    History of Present Illness 74 yo M with SOB in the setting of myasthenia gravis    PT Comments    Patient seen for activity progression. Patient was able to tolerate some ambulation with use of RW and assist. Patient with noted instability and decreased functional awareness of safety and positioning with use of assistive device. Spoke with patient and spouse at length regarding recommendations for disposition. They are hopeful for return to CIR prior to d/c. I feel that this patient would be ideal candidate for continued CIR rehabilitation as he did progress well with previous treatment. Ideal plan would be to get a little stronger and more functionally independent prior to discharge home with spouse. Will continue to see and progress as tolerated.   Follow Up Recommendations  CIR     Equipment Recommendations  None recommended by PT    Recommendations for Other Services Rehab consult     Precautions / Restrictions Precautions Precautions: Fall Precaution Comments: Montior HR and O2 Restrictions Weight Bearing Restrictions: No    Mobility  Bed Mobility Overal bed mobility: Needs Assistance Bed Mobility: Supine to Sit;Sit to Supine     Supine to sit: Min guard;HOB elevated Sit to supine: Min guard   General bed mobility comments: No physical assist required  Transfers Overall transfer level: Needs assistance Equipment used: Rolling walker (2 wheeled);2 person hand held assist Transfers: Sit to/from Stand Sit to Stand: Min assist;+2 physical assistance         General transfer comment: Min assist to elevate to standing  Ambulation/Gait Ambulation/Gait assistance: Min assist Ambulation Distance (Feet): 18 Feet Assistive device: Rolling walker (2 wheeled) Gait Pattern/deviations: Step-to pattern;Decreased stride length;Drifts right/left;Trunk  flexed Gait velocity: decreased Gait velocity interpretation: Below normal speed for age/gender General Gait Details: VCs for positioning with use of RW, cues for upright posture, patient with increased fatigue during ambulation but able to complete tasks.   Stairs            Wheelchair Mobility    Modified Rankin (Stroke Patients Only)       Balance   Sitting-balance support: Feet supported Sitting balance-Leahy Scale: Fair     Standing balance support: Bilateral upper extremity supported Standing balance-Leahy Scale: Poor Standing balance comment: Heavy reliance on UE support with RW during static standing trials. performed x4                     Cognition Arousal/Alertness: Awake/alert Behavior During Therapy: Flat affect Overall Cognitive Status: Impaired/Different from baseline Area of Impairment: Safety/judgement;Problem solving         Safety/Judgement: Decreased awareness of safety   Problem Solving: Slow processing;Requires verbal cues      Exercises General Exercises - Lower Extremity Ankle Circles/Pumps: AROM;Both;10 reps;Seated    General Comments        Pertinent Vitals/Pain Pain Assessment: Faces Faces Pain Scale: Hurts little more Pain Location: abdomen Pain Descriptors / Indicators: Discomfort Pain Intervention(s): Limited activity within patient's tolerance;Monitored during session;Relaxation;Repositioned    Home Living                      Prior Function            PT Goals (current goals can now be found in the care plan section) Acute Rehab PT Goals Patient Stated Goal: get back to being independent again PT Goal Formulation: With  patient/family Time For Goal Achievement: 01/30/15 Potential to Achieve Goals: Good Progress towards PT goals: Progressing toward goals    Frequency  Min 3X/week    PT Plan Current plan remains appropriate    Co-evaluation             End of Session Equipment Utilized  During Treatment: Gait belt;Oxygen Activity Tolerance: Patient limited by fatigue Patient left: in bed;with call bell/phone within reach;with family/visitor present     Time: 1640-1706 PT Time Calculation (min) (ACUTE ONLY): 26 min  Charges:  $Gait Training: 8-22 mins $Self Care/Home Management: 8-22                    G CodesDuncan Dull February 05, 2015, 5:28 PM Alben Deeds, [place] DPT  [phone]

## 2015-01-20 NOTE — Progress Notes (Signed)
Came to visit patient at bedside earlier. He was off the unit at the time. Spoke with wife to make aware that [facility] will continue to follow post hospital discharge. Marthenia Rolling, MSN- [facility] [phone]

## 2015-01-21 ENCOUNTER — Encounter (HOSPITAL_COMMUNITY): Payer: Self-pay | Admitting: *Deleted

## 2015-01-21 LAB — COMPREHENSIVE METABOLIC PANEL
ALK PHOS: 28 U/L — AB (ref 39–117)
ALT: 13 U/L (ref 0–53)
ANION GAP: 4 — AB (ref 5–15)
AST: 17 U/L (ref 0–37)
Albumin: 3.8 g/dL (ref 3.5–5.2)
BUN: 15 mg/dL (ref 6–23)
CALCIUM: 8.7 mg/dL (ref 8.4–10.5)
CO2: 28 mmol/L (ref 19–32)
Chloride: 111 mmol/L (ref 96–112)
Creatinine, Ser: 0.6 mg/dL (ref 0.50–1.35)
GFR calc Af Amer: >90 mL/min (ref >90)
GFR calc non Af Amer: >90 mL/min (ref >90)
Glucose, Bld: 136 mg/dL — ABNORMAL HIGH (ref 70–99)
Potassium: 3.7 mmol/L (ref 3.5–5.1)
Sodium: 143 mmol/L (ref 135–145)
Total Bilirubin: 0.8 mg/dL (ref 0.3–1.2)
Total Protein: 4.4 g/dL — ABNORMAL LOW (ref 6.0–8.3)

## 2015-01-21 LAB — CBC WITH DIFFERENTIAL/PLATELET
BASOS PCT: 0 % (ref 0–1)
Basophils Absolute: 0 10*3/uL (ref 0.0–0.1)
Eosinophils Absolute: 0 10*3/uL (ref 0.0–0.7)
Eosinophils Relative: 0 % (ref 0–5)
HCT: 40.4 % (ref 39.0–52.0)
HEMOGLOBIN: 13.2 g/dL (ref 13.0–17.0)
LYMPHS PCT: 13 % (ref 12–46)
Lymphs Abs: 1.7 10*3/uL (ref 0.7–4.0)
MCH: 31 pg (ref 26.0–34.0)
MCHC: 32.7 g/dL (ref 30.0–36.0)
MCV: 94.8 fL (ref 78.0–100.0)
Monocytes Absolute: 0.8 10*3/uL (ref 0.1–1.0)
Monocytes Relative: 6 % (ref 3–12)
Neutro Abs: 10.7 10*3/uL — ABNORMAL HIGH (ref 1.7–7.7)
Neutrophils Relative %: 81 % — ABNORMAL HIGH (ref 43–77)
PLATELETS: 190 10*3/uL (ref 150–400)
RBC: 4.26 MIL/uL (ref 4.22–5.81)
RDW: 17.3 % — ABNORMAL HIGH (ref 11.5–15.5)
WBC: 13.2 10*3/uL — ABNORMAL HIGH (ref 4.0–10.5)

## 2015-01-21 LAB — GLUCOSE, CAPILLARY
GLUCOSE-CAPILLARY: 112 mg/dL — AB (ref 70–99)
GLUCOSE-CAPILLARY: 280 mg/dL — AB (ref 70–99)
Glucose-Capillary: 137 mg/dL — ABNORMAL HIGH (ref 70–99)
Glucose-Capillary: 214 mg/dL — ABNORMAL HIGH (ref 70–99)

## 2015-01-21 LAB — MAGNESIUM: Magnesium: 1.7 mg/dL (ref 1.5–2.5)

## 2015-01-21 LAB — PHOSPHORUS: Phosphorus: 3.1 mg/dL (ref 2.3–4.6)

## 2015-01-21 MED ORDER — PNEUMOCOCCAL VAC POLYVALENT 25 MCG/0.5ML IJ INJ
0.5000 mL | INJECTION | INTRAMUSCULAR | Status: AC
  Administered 2015-01-22: 0.5 mL via INTRAMUSCULAR
  Filled 2015-01-21: qty 0.5

## 2015-01-21 MED ORDER — INSULIN GLARGINE 100 UNIT/ML ~~LOC~~ SOLN
24.0000 [IU] | Freq: Every day | SUBCUTANEOUS | Status: DC
Start: 2015-01-22 — End: 2015-01-22
  Administered 2015-01-22: 24 [IU] via SUBCUTANEOUS
  Filled 2015-01-21: qty 0.24

## 2015-01-21 MED ORDER — HYDROCOD POLST-CHLORPHEN POLST 10-8 MG/5ML PO LQCR
5.0000 mL | Freq: Once | ORAL | Status: AC
  Administered 2015-01-22: 5 mL via ORAL
  Filled 2015-01-21: qty 5

## 2015-01-21 MED ORDER — ALBUTEROL SULFATE (2.5 MG/3ML) 0.083% IN NEBU
2.5000 mg | INHALATION_SOLUTION | RESPIRATORY_TRACT | Status: DC | PRN
  Administered 2015-01-21: 2.5 mg via RESPIRATORY_TRACT
  Filled 2015-01-21: qty 3

## 2015-01-21 NOTE — Progress Notes (Signed)
Speech Language Pathology Treatment:    Patient Details Name: Craig [name] MRN: [id number] DOB: 03-02-1941 Today's Date: 01/21/2015 Time: 0945-1000 SLP Time Calculation (min) (ACUTE ONLY): 15 min  Assessment / Plan / Recommendation Clinical Impression  Pt demonstrates excellent tolerance of thin liquids. Regular solids also trialed with mild sensation of residuals which cleared by following solids with liquids. Discussed pt with MD who agreed with regular texture solids, will again write regular diet and thin liquids. SLP to follow for tolerance.    HPI HPI: [name], never smoker, transferred to [facility] on 1/22 with acute MG exacerbation & respiratory failure. Decompensated requiring intubation 1/22-1/25.  The patient was subsequently transferred to the rehab unit for several weeks of therapy and was home for approximately a week prior to readmission for trouble breathing.  The patient's wife reports that HHST had seen the patient for an evaluation only and that therapy had not begun.  The patient is currently NPO receiving continuous PEG feeds.   Pertinent Vitals Pain Assessment: No/denies pain  SLP Plan  MBS    Recommendations Diet recommendations: Regular;Thin liquid Liquids provided via: Cup;Straw Medication Administration: Whole meds with puree Supervision: Intermittent supervision to cue for compensatory strategies Compensations: Slow rate;Small sips/bites;Follow solids with liquid Postural Changes and/or Swallow Maneuvers: Seated upright 90 degrees              Oral Care Recommendations: Oral care BID Follow up Recommendations: Inpatient Rehab Plan: [place] Nuriyah Hanline, MA CCC-SLP [phone]  Lynann Beaver 01/21/2015, 10:02 AM

## 2015-01-21 NOTE — Progress Notes (Signed)
Report called to Denice Paradise, RN on [address]

## 2015-01-21 NOTE — Evaluation (Signed)
Occupational Therapy Evaluation Patient Details Name: Craig [name] MRN: [id number] DOB: 1941/01/19 Today's Date: 01/21/2015    History of Present Illness 74 yo M with SOB in the setting of myasthenia gravis   Clinical Impression   Pt was assisted for showering in the week he was home prior to readmission.  He presents with generalized weakness, greater proximal than distal, decreased balance and impaired cognition interfering with ability to perform ADL and ADL transfers.  Pt noted to increase to 133 with standing today. Pt will be a good inpatient rehab candidate. Will follow acutely.    Follow Up Recommendations  CIR    Equipment Recommendations  None recommended by OT    Recommendations for Other Services       Precautions / Restrictions Precautions Precautions: Fall Precaution Comments: Montior HR and O2 Restrictions Weight Bearing Restrictions: No      Mobility Bed Mobility Overal bed mobility: Needs Assistance Bed Mobility: Supine to Sit;Sit to Supine     Supine to sit: Supervision;HOB elevated Sit to supine: Supervision   General bed mobility comments: No physical assist required  Transfers Overall transfer level: Needs assistance Equipment used: Rolling walker (2 wheeled) Transfers: Sit to/from Stand Sit to Stand: Min assist;From elevated surface         General transfer comment: min assist to rise and gain balance    Balance     Sitting balance-Leahy Scale: Fair       Standing balance-Leahy Scale: Poor                              ADL Overall ADL's : Needs assistance/impaired Eating/Feeding: Set up;Bed level Eating/Feeding Details (indicate cue type and reason): oral feeds and peg Grooming: Minimal assistance;Sitting   Upper Body Bathing: Minimal assitance;Sitting   Lower Body Bathing: Moderate assistance;Sit to/from stand Lower Body Bathing Details (indicate cue type and reason): difficulty with releasing RW and maintinaing  balance with peri care Upper Body Dressing : Minimal assistance;Sitting   Lower Body Dressing: Moderate assistance;Sit to/from stand   Toilet Transfer: Minimal assistance;RW   Toileting- Clothing Manipulation and Hygiene: Moderate assistance;Sit to/from stand         General ADL Comments: Doffed sock, assisted to start over toes to donn. Reliant on at least one hand on walker for balance.     Vision     Perception     Praxis      Pertinent Vitals/Pain Pain Assessment: No/denies pain     Hand Dominance Right   Extremity/Trunk Assessment Upper Extremity Assessment Upper Extremity Assessment: RUE deficits/detail;LUE deficits/detail RUE Deficits / Details: 2+/5 shoulder to 80 degrees, full PROM, 4+/5 elbow to hand RUE Coordination: decreased gross motor LUE Deficits / Details: 3-/5 shoulder, 4+/5 elbow to hand LUE Coordination: decreased gross motor   Lower Extremity Assessment Lower Extremity Assessment: Defer to PT evaluation   Cervical / Trunk Assessment Cervical / Trunk Assessment: Kyphotic Cervical / Trunk Exceptions: forward head   Communication Communication Communication: Expressive difficulties   Cognition Arousal/Alertness: Awake/alert Behavior During Therapy: Flat affect Overall Cognitive Status: Impaired/Different from baseline Area of Impairment: Safety/judgement;Problem solving     Memory: Decreased short-term memory   Safety/Judgement: Decreased awareness of safety   Problem Solving: Slow processing;Requires verbal cues     General Comments       Exercises       Shoulder Instructions      Home Living Family/patient expects to be discharged to:: Inpatient  rehab Living Arrangements: Spouse/significant other Available Help at Discharge: Family;Available 24 hours/day Type of Home: House Home Access: Stairs to enter [employer] of Steps: 3 Entrance Stairs-Rails: Right Home Layout: One level;Laundry or work area in basement      [employer] Shower/Tub: Occupational psychologist: [place] Accessibility: Yes How Accessible: Accessible via walker Home Equipment: Environmental consultant - 2 wheels;Cane - single point;Bedside commode;Shower seat      Lives With: Spouse    Prior Functioning/Environment Level of Independence: Needs assistance  Gait / Transfers Assistance Needed: RW for gait ADL's / Homemaking Assistance Needed: needs assist for showering   Comments: Information since discharge home.    OT Diagnosis: Generalized weakness;Cognitive deficits   OT Problem List: Decreased strength;Decreased activity tolerance;Impaired balance (sitting and/or standing);Decreased cognition;Decreased coordination;Decreased safety awareness;Decreased knowledge of use of DME or AE;Obesity;Impaired UE functional use   OT Treatment/Interventions: Self-care/ADL training;Therapeutic exercise;Energy conservation;DME and/or AE instruction;Therapeutic activities;Patient/family education;Balance training    OT Goals(Current goals can be found in the care plan section) Acute Rehab OT Goals Patient Stated Goal: get back to being independent again OT Goal Formulation: With patient Time For Goal Achievement: 02/04/15 Potential to Achieve Goals: Good ADL Goals Pt Will Perform Grooming: with supervision;standing Pt Will Perform Upper Body Bathing: with supervision;with adaptive equipment;sitting Pt Will Perform Lower Body Bathing: with supervision;with adaptive equipment;sit to/from stand Pt Will Perform Upper Body Dressing: with supervision;sitting Pt Will Perform Lower Body Dressing: with supervision;with adaptive equipment;sit to/from stand Pt Will Transfer to Toilet: with supervision;ambulating Pt Will Perform Toileting - Clothing Manipulation and hygiene: with supervision;sit to/from stand  OT Frequency: Min 2X/week   Barriers to D/C:            Co-evaluation              End of Session Equipment Utilized During  Treatment: Oxygen;Rolling walker Nurse Communication: Other (comment) (elevated HR)  Activity Tolerance: Treatment limited secondary to medical complications (Comment) (HR elevated 133 with standing) Patient left: in bed;with call bell/phone within reach;with family/visitor present   Time: 5974-1638 OT Time Calculation (min): 40 min Charges:  OT General Charges $OT Visit: 1 Procedure OT Evaluation $Initial OT Evaluation Tier I: 1 Procedure OT Treatments $Self Care/Home Management : 8-22 mins G-Codes:    Malka So 01/21/2015, 9:48 AM  [phone]

## 2015-01-21 NOTE — Progress Notes (Signed)
Physical Therapy Treatment Patient Details Name: Craig [name] MRN: [id number] DOB: 1941/06/24 Today's Date: 01/21/2015    History of Present Illness 74 yo M with SOB in the setting of myasthenia gravis    PT Comments    Pt very fatigued today, worked on transfers and exercises, pt unable to ambulate. Min A +2 for pivot to chair with RW. Despite fatigue, pt motivated to work with therapy and get OOB. PT will continue to follow.   Follow Up Recommendations  CIR     Equipment Recommendations  None recommended by PT    Recommendations for Other Services Rehab consult     Precautions / Restrictions Precautions Precautions: Fall Precaution Comments: Montior HR and O2 Restrictions Weight Bearing Restrictions: No Other Position/Activity Restrictions: HOB elevated due to continuous tube feeds.    Mobility  Bed Mobility Overal bed mobility: Needs Assistance Bed Mobility: Supine to Sit     Supine to sit: Supervision;HOB elevated     General bed mobility comments: use of rail, able to get to EOB without physical assist  Transfers Overall transfer level: Needs assistance Equipment used: Rolling walker (2 wheeled) Transfers: Sit to/from [employer] Transfers Sit to Stand: Min assist;+2 physical assistance Stand pivot transfers: Min assist;+2 safety/equipment       General transfer comment: due to fatigue, min A to rise and pivot with RW  Ambulation/Gait             General Gait Details: too fatigued today   Stairs            Wheelchair Mobility    Modified Rankin (Stroke Patients Only)       Balance Overall balance assessment: Needs assistance Sitting-balance support: No upper extremity supported;Feet supported Sitting balance-Leahy Scale: Good Sitting balance - Comments: pt able to wt shift in sitting without UE support and no LOB   Standing balance support: Single extremity supported;During functional activity Standing balance-Leahy Scale:  Poor Standing balance comment: able to stand with single UE support                     Cognition Arousal/Alertness: Lethargic Behavior During Therapy: Flat affect Overall Cognitive Status: Impaired/Different from baseline Area of Impairment: Problem solving     Memory: Decreased short-term memory       Problem Solving: Slow processing;Decreased initiation;Requires verbal cues General Comments: lethargic today    Exercises General Exercises - Lower Extremity Ankle Circles/Pumps: AROM;Both;10 reps;Seated Long Arc Quad: AROM;Both;10 reps;Seated (with manual resistance) Heel Slides: AROM;Both;10 reps;Seated (with manual resistance) Hip Flexion/Marching: AROM;Both;10 reps;Seated    General Comments General comments (skin integrity, edema, etc.): HR and O2 sats stable throughout session, kept on 2 L O2      Pertinent Vitals/Pain Pain Assessment: No/denies pain    Home Living                      Prior Function            PT Goals (current goals can now be found in the care plan section) Acute Rehab PT Goals Patient Stated Goal: get back to being independent again PT Goal Formulation: With patient/family Time For Goal Achievement: 01/30/15 Potential to Achieve Goals: Good Progress towards PT goals: Progressing toward goals    Frequency  Min 3X/week    PT Plan Current plan remains appropriate    Co-evaluation             End of Session Equipment Utilized During Treatment: Gait  belt;Oxygen Activity Tolerance: Patient limited by fatigue Patient left: in chair;with call bell/phone within reach;with family/visitor present     Time: 2811-8867 PT Time Calculation (min) (ACUTE ONLY): 18 min  Charges:  $Therapeutic Activity: 8-22 mins                    G Codes:     Leighton Roach, PT  Acute Rehab Services  [place], [place] 01/21/2015, 2:02 PM

## 2015-01-21 NOTE — Progress Notes (Signed)
Pt. Arrived from [facility] with tech and nurse with VSS stable and reporting no pain. Will continue to monitor. Craig [name] E, [place] 01/21/2015 2215

## 2015-01-21 NOTE — Progress Notes (Signed)
I met with pt at bedside with his wife. I will begin insurance authorization with [employer] for an inpt rehab admission. Pt and his wife prefer another inpt rehab admission prior to d/c home. They refuse SNF placement. [phone]

## 2015-01-21 NOTE — Progress Notes (Signed)
NIF: -22 VC: 650 Flutter x10

## 2015-01-21 NOTE — PMR Pre-admission (Signed)
PMR Admission Coordinator Pre-Admission Assessment  Patient: Craig [name] is an 74 y.o., male MRN: [id number] DOB: [birth year] Height: 5\' 7"  (170.2 cm) Weight: 107.6 kg (237 lb 3.4 oz)              Insurance Information HMO:     PPO: yes     PCP:      IPA:      80/20:      OTHER: medicare advantage plan PRIMARY: [employer]      Policy#: [id number]      Subscriber: pt CM Name: Enrique Sack    Phone#: [phone]     Fax#: [phone] Pre-Cert#: [id number]      Employer: retired Benefits:  Phone #: [phone]     Name: 01/22/15 Eff. Date: 11/14/14     Deduct: none      Out of Pocket Max: $3100      Life Max: none CIR: $225 copay per admit then $75 per day dayd 2 through 5      SNF: no copay days 1-20; $140 copay per day days 21-100 Outpatient: $10 per visit copay     Co-Pay: no visit limit Home Health: $10 copay per visit      Co-Pay: no visi tlimit DME: 80%     Co-Pay: 20% Providers: in network  SECONDARY: none       Medicaid Application Date:       Case Manager:  Disability Application Date:       Case Worker:   Emergency Facilities manager Information    Name Relation Home Work Mobile   Coakley,Martha Spouse [phone]     Manny, Vitolo   [phone]     Current Medical History  Patient Admitting Diagnosis: myasthenia gravis with crisis  History of Present Illness:Craig [name] is a 74 y.o. male with history of AFib, bladder cancer, DM type 2, MG with recent flare who was recently discharged to home past CIR on 01/02/15. He was seen by outpatient neurologist on 02/26 with plans to start home IVIG but was readmitted on 01/10/15 with fatigue, worsening of SOB and hypoxia.   HD Cath placed by CCM on 02/28 and neurology recommended IVIG X 5 treatments. CCM consulted for close monitoring of respiratory status due to concerns of requiring intubation and he was started on IV zosyn for PNA. He developed diarrhea with recurrent c diff and was started on metronidazole on  03/05. He has had neck discomfort due to HD cath requiring IV fentanyl for pain management. He has completed his treatment course of IVIG today and is reporting increase in strength as well as swallow function. MBS done today and patient started on regular diet with thin liquids.  Plasmapheresis completed 5/5 on 01/21/15. Neurologist to follow as outpatient at this time. Recommend rehabilitation.   Past Medical History  Past Medical History  Diagnosis Date  . Atrial fibrillation, chronic     on Xarelto   . HTN (hypertension)   . CHF (congestive heart failure)   . Diabetes mellitus   . 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 Myasthenia gravis     Family History  family history includes Colon cancer in his mother; Diabetes in his father; Heart attack in his father.  Prior Rehab/Hospitalizations: CIR 12/29/14 through 01/02/15. F/u with Dr. Krista Blue at outptatient neurologist who was attempting insurance approval for home IVIG but pt function declined and pt readmitted to acute hospital  Current Medications  Current facility-administered medications:  .  0.9 %  sodium chloride infusion, , Intravenous, Continuous, Raylene Miyamoto, MD, Last Rate: 10 mL/hr at 01/20/15 2223 .  albuterol (PROVENTIL) (2.5 MG/3ML) 0.083% nebulizer solution 2.5 mg, 2.5 mg, Nebulization, Q2H PRN, Cherene Altes, MD, 2.5 mg at 01/21/15 2309 .  amiodarone (PACERONE) tablet 100 mg, 100 mg, Per Tube, Daily, Donita Brooks, NP, 100 mg at 01/22/15 1041 .  antiseptic oral rinse (CPC / CETYLPYRIDINIUM CHLORIDE 0.05%) solution 7 mL, 7 mL, Mouth Rinse, q12n4p, Amie Portland, MD, 7 mL at 01/21/15 1600 .  chlorhexidine (PERIDEX) 0.12 % solution 15 mL, 15 mL, Mouth Rinse, BID, Amie Portland, MD, 15 mL at 01/22/15 0848 .  famotidine (PEPCID) 40 MG/5ML suspension 40 mg, 40 mg, Per Tube, Daily, Cherene Altes, MD, 40 mg at 01/22/15 1042 .  feeding supplement (JEVITY  1.2 CAL) liquid 1,000 mL, 1,000 mL, Per Tube, Continuous, Ardeen Garland, RD, Last Rate: 65 mL/hr at 01/21/15 0635, 1,000 mL at 01/21/15 0635 .  feeding supplement (PRO-STAT SUGAR FREE 64) liquid 30 mL, 30 mL, Per Tube, Daily, Ardeen Garland, RD, 30 mL at 01/22/15 1041 .  fentaNYL (SUBLIMAZE) injection 25-50 mcg, 25-50 mcg, Intravenous, Q2H PRN, Colbert Coyer, MD, 50 mcg at 01/21/15 2151 .  insulin aspart (novoLOG) injection 0-15 Units, 0-15 Units, Subcutaneous, 6 times per day, Donita Brooks, NP, 3 Units at 01/22/15 0847 .  insulin aspart (novoLOG) injection 4 Units, 4 Units, Subcutaneous, 6 times per day, Donita Brooks, NP, 4 Units at 01/22/15 0846 .  insulin glargine (LANTUS) injection 24 Units, 24 Units, Subcutaneous, Daily, Cherene Altes, MD, 24 Units at 01/22/15 1042 .  metoprolol (LOPRESSOR) injection 2.5-5 mg, 2.5-5 mg, Intravenous, Q3H PRN, Raylene Miyamoto, MD .  metroNIDAZOLE (FLAGYL) tablet 500 mg, 500 mg, Per Tube, 3 times per day, Cherene Altes, MD, 500 mg at 01/22/15 0558 .  mupirocin ointment (BACTROBAN) 2 %, , Topical, Daily, Raylene Miyamoto, MD .  predniSONE (DELTASONE) tablet 60 mg, 60 mg, Per Tube, Q breakfast, Cherene Altes, MD, 60 mg at 01/22/15 0849 .  pyridostigmine (MESTINON) tablet 60 mg, 60 mg, Oral, 4 times per day, Amie Portland, MD, 60 mg at 01/22/15 0558 .  rivaroxaban (XARELTO) tablet 20 mg, 20 mg, Per Tube, Q supper, Olam Idler, MD, 20 mg at 01/21/15 1914 .  sodium chloride 0.9 % injection 10-40 mL, 10-40 mL, Intracatheter, PRN, Amie Portland, MD, 20 mL at 01/11/15 1101  Patients Current Diet: Diet regular with thin liquids. FEEs 01/20/15 whole meds with puree.  Precautions / Restrictions Precautions Precautions: Fall Precaution Comments: Montior HR and O2 Restrictions Weight Bearing Restrictions: No Other Position/Activity Restrictions: HOB elevated due to continuous tube feeds.   Prior Activity Level Recent d/c  01/02/15 from CIR. Was Mod I to supervision with wife assisting. Home PEG feeds. NPO at home. New to home 02 since d/c. Also yonker suctioning at home.  Home Assistive Devices / Equipment Home Assistive Devices/Equipment: Environmental consultant (specify type), Shower chair with back Home Equipment: Walker - 2 wheels, Cane - single point, Bedside commode, Shower seat  Prior Functional Level Prior Function Level of Independence: Needs assistance Gait / Transfers Assistance Needed: RW for gait ADL's / Homemaking Assistance Needed: needs assist for showering Comments: Information since discharge home.  Current Functional Level Cognition  Overall Cognitive Status: Impaired/Different from baseline Orientation Level: Oriented X4 Safety/Judgement: Decreased awareness of safety General Comments:  lethargic today    Extremity Assessment (includes Sensation/Coordination)  Upper Extremity Assessment: RUE deficits/detail, LUE deficits/detail RUE Deficits / Details: 2+/5 shoulder to 80 degrees, full PROM, 4+/5 elbow to hand RUE Coordination: decreased gross motor LUE Deficits / Details: 3-/5 shoulder, 4+/5 elbow to hand LUE Coordination: decreased gross motor  Lower Extremity Assessment: Defer to PT evaluation    ADLs  Overall ADL's : Needs assistance/impaired Eating/Feeding: Set up, Bed level Eating/Feeding Details (indicate cue type and reason): oral feeds and peg Grooming: Minimal assistance, Sitting Upper Body Bathing: Minimal assitance, Sitting Lower Body Bathing: Moderate assistance, Sit to/from stand Lower Body Bathing Details (indicate cue type and reason): difficulty with releasing RW and maintinaing balance with peri care Upper Body Dressing : Minimal assistance, Sitting Lower Body Dressing: Moderate assistance, Sit to/from stand Toilet Transfer: Minimal assistance, RW Toileting- Clothing Manipulation and Hygiene: Moderate assistance, Sit to/from stand General ADL Comments: Doffed sock, assisted  to start over toes to donn. Reliant on at least one hand on walker for balance.    Mobility  Overal bed mobility: Needs Assistance Bed Mobility: Supine to Sit Supine to sit: Supervision, HOB elevated Sit to supine: Supervision General bed mobility comments: use of rail, able to get to EOB without physical assist    Transfers  Overall transfer level: Needs assistance Equipment used: Rolling walker (2 wheeled) Transfers: Sit to/from Stand, Stand Pivot Transfers Sit to Stand: Min assist, +2 physical assistance Stand pivot transfers: Min assist, +2 safety/equipment General transfer comment: due to fatigue, min A to rise and pivot with RW    Ambulation / Gait / Stairs / Wheelchair Mobility  Ambulation/Gait Ambulation/Gait assistance: Museum/gallery curator (Feet): 18 Feet Assistive device: Rolling walker (2 wheeled) Gait Pattern/deviations: Step-to pattern, Decreased stride length, Drifts right/left, Trunk flexed Gait velocity: decreased Gait velocity interpretation: Below normal speed for age/gender General Gait Details: too fatigued today    Posture / Balance Dynamic Sitting Balance Sitting balance - Comments: pt able to wt shift in sitting without UE support and no LOB Balance Overall balance assessment: Needs assistance Sitting-balance support: No upper extremity supported, Feet supported Sitting balance-Leahy Scale: Good Sitting balance - Comments: pt able to wt shift in sitting without UE support and no LOB Standing balance support: Single extremity supported, During functional activity Standing balance-Leahy Scale: Poor Standing balance comment: able to stand with single UE support     Special needs/care consideration BiPAP/CPAP yes Oxygen 2 liters [place]                             Bowel mgmt: rectal tube with cdiff positive Bladder mgmt: foley Diabetic mgmtyes [facility], [place], to have [facility] follow up at d/c. [place] via [place] per Dr. Reynaldo Minium   Previous Home  Environment Living Arrangements: Spouse/significant other  Lives With: Spouse Available Help at Discharge: Family, Available 24 hours/day Type of Home: House Home Layout: One level, Laundry or work area in basement Home Access: Stairs to enter Entrance Stairs-Rails: Right Technical brewer of Steps: 3 Bathroom Shower/Tub: Gaffer, Chiropodist: Standard Bathroom Accessibility: Yes How Accessible: Accessible via walker [place]: No Type of Home Care Services: Other (Comment) (DMe via [place] care, [facility] for Home he) [place] (if known): [facility] for DME; [facility] [place] [facility] for [facility]  Discharge Living Setting Plans for Discharge Living Setting: Patient's home, Lives with (comment), Other (Comment) (wife) Type of Home at Discharge:  House Discharge Home Layout: One level Discharge Home Access: Stairs to enter Entrance Stairs-Rails: Right Entrance Stairs-Number of Steps: 3 Discharge Bathroom Shower/Tub: Tub/shower unit Discharge Bathroom Toilet: Standard Discharge Bathroom Accessibility: Yes How Accessible: Accessible via walker Does the patient have any problems obtaining your medications?: Yes (Describe)  Social/Family/Support Systems Patient Roles: Spouse, Parent Contact Information: Luster Landsberg, wife Anticipated Caregiver: wife Anticipated Caregiver's Contact Information: see above Ability/Limitations of Caregiver: no limitations Caregiver Availability: 24/7 Discharge Plan Discussed with Primary Caregiver: Yes Is Caregiver In Agreement with Plan?: Yes Does Caregiver/Family have Issues with Lodging/Transportation while Pt is in Rehab?: No  Goals/Additional Needs Patient/Family Goal for Rehab: Mod I to supervision with PT and OT, supervision to min assist SLP Expected length of stay: ELOS 7 days Dietary Needs: Regular diet with thin liquids rec by SLP 3/8 after FEES. Still has PEG Equipment Needs: New Peg last  admission Pt/Family Agrees to Admission and willing to participate: Yes Program Orientation Provided & Reviewed with Pt/Caregiver Including Roles  & Responsibilities: Yes  Decrease burden of Care through IP rehab admission: n/a  Possible need for SNF placement upon discharge:not anticipated. Wife states she will take home before she will consider SNF placement.  Patient Condition: This patient's medical and functional status has changed since the consult dated: 01/20/2015 in which the Rehabilitation Physician determined and documented that the patient's condition is appropriate for intensive rehabilitative care in an inpatient rehabilitation facility. See "History of Present Illness" (above) for medical update. Functional changes are: mod assist. Patient's medical and functional status update has been discussed with the Rehabilitation physician and patient remains appropriate for inpatient rehabilitation. Will admit to inpatient rehab today.  Preadmission Screen Completed By:  Cleatrice Burke, 01/22/2015 11:02 AM ______________________________________________________________________   Discussed status with Dr. Naaman Plummer on 01/22/15 at  37 and received telephone approval for admission today.  Admission Coordinator:  Cleatrice Burke, time 3785 Date 01/22/2015

## 2015-01-21 NOTE — Progress Notes (Signed)
NIF -39 flutter x10

## 2015-01-21 NOTE — Progress Notes (Signed)
Craig [name] TEAM 1 - Stepdown/ICU TEAM Progress Note  Craig [name] WPV:[id number] DOB: 18-Sep-1941 DOA: 01/10/2015 PCP: Craig Held, MD  Admit HPI / Brief Narrative: [name] never smoker, with Hx of MG with recent flare and prolonged admission to [facility] who was admitted on 2/27 with worsening SOB, fatigue and low oxygen saturations. Recently discharged 2/19 from CIR after MG exacerbation admit that required intubation / PLEX rx.   Significant Events: 2/27 Admit with worsening SOB, fatigue and low O2 saturations. NIF -30, -22, -20. VC 450, 700, 610 ml 2/28 PCCM consulted for evaluation of worsening respiratory status. NIF -40, VC 1.6L 2/29 R basilar atx 3/3 no daytime NIMV needed  HPI/Subjective: Pt is resting comfortably w/ no new complaints.  Denies cp, n/v, abdom pain, or sob.  Good appetite and tolerating his diet w/o difficulty.    Assessment/Plan:  Myasthenia Gravis with Acute Exacerbation  Neurology has now signed off - PLEX course has been completed - S/p IVIG 2/28 - Daily NIF stable - much improved - now ready for rehab efforts   Acute Respiratory Failure - in setting of MG exacerbation off daytime NIMV - continues on nocturnal BIPAP, but has been refusing due to nasal wound - appears stable from respiratory standpoint   C diff collitis v/s colonization of colon  Cont flagyl tx - continues to have diarrhea, but this could be due to his tube feeds - he completed a 14 day course of flagyl w/ last dose on 12/24/14 - will plan for tx with an additional 14 days w/ isolated flagyl tx for a possible first recurrence (through 3/19)  OSA on bipap 18/8 at baseline  Chronic Atrial Fibrillation on Xarelto and oral amio - well controlled   HTN BP currently well controlled   HLD Resume lipitor when oral intake resumes and is consistent   Hypophosphatemia Resolved   Hypokalemia  Due to GI losses w/ ongoing diarrhea - replaced to normal range   Metastatic Bladder  Cancer bladder cancer diagnosed in 2014, underwent resection, followed by chemotherapy for metastatic lesion to his abdominal wall, finished chemotherapy in October 2015 - CT 2/11 at CIR with progressive omental / peritoneal metastasis  Chronic Dysphagia  s/p PEG - bolus feeding at home with reported reflux - now on continuous feeds and tolerating well - SLP has cleared for a diet - diet tolerated thus far - plan to continue tube feeds for now to allow pt to catch up w/ caloric needs - may consider weaning tube feeds after a week or so of consistent oral intake    Possible aspiration PNA v/s atx Stopped broad abx in setting of C diff - no convincing signs/sx of an active pulmonary infection   Nasal Stage II Pressure Ulcer present on admit, was dry scab, open with increase bipap use - slowly improving - pt denies pain in wound, and feels it is getting better   Uncontrolled DM  CBG variable - follow w/o change today   Code Status: FULL Family Communication: spoke w/ wife at bedside at length  Disposition Plan: now stable for transfer to med bed while awaiting CIR v/s SNF    Consultants: Neurology PCCM  Antibiotics: Rocephin 3/2 > 3/5 Zosyn 2/29 > 3/2 Flagyl 3/1 > planned 3/19  DVT prophylaxis: xarelto   Objective: Blood pressure 117/69, pulse 79, temperature 97.7 F (36.5 C), temperature source Oral, resp. rate 19, height 5\' 7"  (1.702 m), weight 107.6 kg (237 lb 3.4 oz), SpO2 96 %.  Intake/Output Summary (Last 24 hours) at 01/21/15 1637 Last data filed at 01/21/15 0600  Gross per 24 hour  Intake   1390 ml  Output    275 ml  Net   1115 ml   Exam: General: No acute respiratory distress - alert and conversant  Lungs: Clear to auscultation bilaterally without wheezes or crackles Cardiovascular: Regular rate and rhythm  Abdomen: No tenderness to deep palpation diffusely, obese, soft, bowel sounds positive, no rebound, no ascites, no appreciable mass - PEG insertion site clean  and dry  Extremities: No significant cyanosis, clubbing, edema bilateral lower extremities  Data Reviewed: Basic Metabolic Panel:  Recent Labs Lab 01/17/15 0308 01/18/15 0330 01/18/15 2033 01/19/15 0130 01/20/15 01/20/15 0954 01/21/15 0445  NA 143 144 145 144 146* 148* 143  K 3.5 3.0* 3.4* 3.5 3.5 3.6 3.7  CL 109 106 101 111 111 104 111  CO2 28 34*  --  30 32  --  28  GLUCOSE 136* 125* 177* 175* 133* 92 136*  BUN 14 15 18 15 17 17 15   CREATININE 0.58 0.51 0.70 0.52 0.56 0.80 0.60  CALCIUM 8.9 8.8  --  9.0 8.9  --  8.7  MG 1.7 1.7  --   --   --   --  1.7  PHOS  --  3.1  --   --   --   --  3.1    Liver Function Tests:  Recent Labs Lab 01/18/15 0330 01/21/15 0445  AST 25 17  ALT 31 13  ALKPHOS 33* 28*  BILITOT 0.5 0.8  PROT 4.2* 4.4*  ALBUMIN 3.3* 3.8    Recent Labs Lab 01/18/15 0330  LIPASE 146*   CBC:  Recent Labs Lab 01/15/15 0505  01/16/15 1059 01/18/15 0250 01/18/15 2033 01/20/15 0954 01/21/15 0445  WBC 13.5*  --  13.2* 14.6*  --   --  13.2*  NEUTROABS  --   --   --   --   --   --  10.7*  HGB 11.9*  < > 12.5* 12.4* 13.6 13.3 13.2  HCT 36.4*  < > 38.1* 37.6* 40.0 39.0 40.4  MCV 93.8  --  94.1 92.6  --   --  94.8  PLT 155  --  171 180  --   --  190  < > = values in this interval not displayed.  CBG:  Recent Labs Lab 01/20/15 1616 01/20/15 2056 01/20/15 2243 01/21/15 0504 01/21/15 0815  GLUCAP 177* 213* 207* 137* 112*    Recent Results (from the past 240 hour(s))  Clostridium Difficile by PCR     Status: Abnormal   Collection Time: 01/12/15  3:01 PM  Result Value Ref Range Status   C difficile by pcr POSITIVE (A) NEGATIVE Final    Comment: CRITICAL RESULT CALLED TO, READ BACK BY AND VERIFIED WITH: T. CRITE RN 17:00 01/12/15 ([email or web address])   Culture, Urine     Status: None   Collection Time: 01/13/15  3:44 PM  Result Value Ref Range Status   Specimen Description URINE, CATHETERIZED  Final   Special Requests NONE  Final   Colony Count NO  GROWTH Performed at [facility]   Final   Culture NO GROWTH Performed at [facility]   Final   Report Status 01/14/2015 FINAL  Final  Clostridium Difficile by PCR     Status: Abnormal   Collection Time: 01/15/15  4:59 PM  Result Value Ref Range Status   C  difficile by pcr POSITIVE (A) NEGATIVE Final    Comment: CRITICAL RESULT CALLED TO, READ BACK BY AND VERIFIED WITH: Brendolyn Patty RN 9:58 01/16/15 ([email or web address])      Studies:  Recent x-ray studies have been reviewed in detail by the Attending Physician  Scheduled Meds:  Scheduled Meds: . albuterol  2.5 mg Nebulization TID  . amiodarone  100 mg Per Tube Daily  . antiseptic oral rinse  7 mL Mouth Rinse q12n4p  . chlorhexidine  15 mL Mouth Rinse BID  . famotidine  40 mg Per Tube Daily  . feeding supplement (PRO-STAT SUGAR FREE 64)  30 mL Per Tube Daily  . insulin aspart  0-15 Units Subcutaneous 6 times per day  . insulin aspart  4 Units Subcutaneous 6 times per day  . insulin glargine  22 Units Subcutaneous Daily  . metroNIDAZOLE  500 mg Per Tube 3 times per day  . mupirocin ointment   Topical Daily  . predniSONE  60 mg Per Tube Q breakfast  . pyridostigmine  60 mg Oral 4 times per day  . rivaroxaban  20 mg Per Tube Q supper    Time spent on care of this patient: 35 mins   Iran Rowe T , MD   [facility] Office  [phone] Pager - Text Page per Shea Evans as per below:  On-Call/Text Page:      [email or web address]      password [email or web address]  If 7PM-7AM, please contact night-coverage [email or web address] Password [email or web address] 01/21/2015, 4:37 PM   LOS: 11 days

## 2015-01-21 NOTE — Progress Notes (Signed)
Pt resting comfortably. No CPT (flutter) done at this time.

## 2015-01-22 ENCOUNTER — Inpatient Hospital Stay (HOSPITAL_COMMUNITY)
Admission: RE | Admit: 2015-01-22 | Discharge: 2015-02-03 | DRG: 057 | Disposition: A | Discharge status: Home Health | Payer: PPO | Source: Intra-hospital | Attending: Physical Medicine & Rehabilitation | Admitting: Physical Medicine & Rehabilitation

## 2015-01-22 DIAGNOSIS — B372 Candidiasis of skin and nail: Secondary | ICD-10-CM | POA: Diagnosis present

## 2015-01-22 DIAGNOSIS — E11649 Type 2 diabetes mellitus with hypoglycemia without coma: Secondary | ICD-10-CM | POA: Diagnosis present

## 2015-01-22 DIAGNOSIS — I4891 Unspecified atrial fibrillation: Secondary | ICD-10-CM | POA: Diagnosis present

## 2015-01-22 DIAGNOSIS — I10 Essential (primary) hypertension: Secondary | ICD-10-CM | POA: Diagnosis present

## 2015-01-22 DIAGNOSIS — G7001 Myasthenia gravis with (acute) exacerbation: Secondary | ICD-10-CM | POA: Diagnosis not present

## 2015-01-22 DIAGNOSIS — I509 Heart failure, unspecified: Secondary | ICD-10-CM | POA: Diagnosis present

## 2015-01-22 DIAGNOSIS — A047 Enterocolitis due to Clostridium difficile: Secondary | ICD-10-CM | POA: Diagnosis present

## 2015-01-22 DIAGNOSIS — E43 Unspecified severe protein-calorie malnutrition: Secondary | ICD-10-CM

## 2015-01-22 DIAGNOSIS — G4733 Obstructive sleep apnea (adult) (pediatric): Secondary | ICD-10-CM | POA: Diagnosis present

## 2015-01-22 DIAGNOSIS — R5381 Other malaise: Secondary | ICD-10-CM | POA: Insufficient documentation

## 2015-01-22 DIAGNOSIS — A0472 Enterocolitis due to Clostridium difficile, not specified as recurrent: Secondary | ICD-10-CM | POA: Diagnosis present

## 2015-01-22 DIAGNOSIS — I482 Chronic atrial fibrillation: Secondary | ICD-10-CM | POA: Diagnosis present

## 2015-01-22 LAB — GLUCOSE, CAPILLARY
GLUCOSE-CAPILLARY: 169 mg/dL — AB (ref 70–99)
GLUCOSE-CAPILLARY: 167 mg/dL — AB (ref 70–99)
Glucose-Capillary: 173 mg/dL — ABNORMAL HIGH (ref 70–99)
Glucose-Capillary: 174 mg/dL — ABNORMAL HIGH (ref 70–99)
Glucose-Capillary: 290 mg/dL — ABNORMAL HIGH (ref 70–99)
Glucose-Capillary: 97 mg/dL (ref 70–99)

## 2015-01-22 MED ORDER — TRAZODONE HCL 50 MG PO TABS
25.0000 mg | ORAL_TABLET | Freq: Every evening | ORAL | Status: DC | PRN
  Administered 2015-01-22 – 2015-02-01 (×7): 50 mg via ORAL
  Filled 2015-01-22 (×7): qty 1

## 2015-01-22 MED ORDER — ACETAMINOPHEN 325 MG PO TABS
325.0000 mg | ORAL_TABLET | ORAL | Status: DC | PRN
  Administered 2015-01-22 – 2015-01-23 (×3): 650 mg via ORAL
  Administered 2015-01-23: 325 mg via ORAL
  Administered 2015-01-23 (×2): 650 mg via ORAL
  Administered 2015-01-23: 325 mg via ORAL
  Administered 2015-01-24 – 2015-02-01 (×8): 650 mg via ORAL
  Filled 2015-01-22 (×16): qty 2

## 2015-01-22 MED ORDER — ALUM & MAG HYDROXIDE-SIMETH 200-200-20 MG/5ML PO SUSP: 30.0000 mL | ORAL | Status: DC | PRN

## 2015-01-22 MED ORDER — HYDROCERIN EX CREA
TOPICAL_CREAM | Freq: Two times a day (BID) | CUTANEOUS | Status: DC
Start: 2015-01-22 — End: 2015-02-03
  Administered 2015-01-22 – 2015-01-25 (×7): via TOPICAL
  Administered 2015-01-26 (×2): 1 via TOPICAL
  Administered 2015-01-27 – 2015-02-03 (×15): via TOPICAL
  Filled 2015-01-22: qty 113

## 2015-01-22 MED ORDER — FAMOTIDINE 40 MG/5ML PO SUSR
40.0000 mg | Freq: Every day | ORAL | Status: DC
  Administered 2015-01-23 – 2015-02-01 (×10): 40 mg
  Filled 2015-01-22 (×11): qty 5

## 2015-01-22 MED ORDER — FREE WATER
100.0000 mL | Freq: Three times a day (TID) | Status: DC
  Administered 2015-01-22 – 2015-01-23 (×4): 100 mL

## 2015-01-22 MED ORDER — MUPIROCIN 2 % EX OINT
TOPICAL_OINTMENT | Freq: Every day | CUTANEOUS | Status: DC
  Administered 2015-01-23 – 2015-01-24 (×2): via TOPICAL
  Administered 2015-01-25: 1 via TOPICAL
  Administered 2015-01-26: 08:00:00 via TOPICAL
  Administered 2015-01-27: 1 via TOPICAL
  Administered 2015-01-28 – 2015-02-03 (×7): via TOPICAL
  Filled 2015-01-22: qty 22

## 2015-01-22 MED ORDER — ONDANSETRON HCL 4 MG/2ML IJ SOLN: 4.0000 mg | Freq: Four times a day (QID) | INTRAMUSCULAR | Status: DC | PRN

## 2015-01-22 MED ORDER — NYSTATIN 100000 UNIT/GM EX POWD
Freq: Three times a day (TID) | CUTANEOUS | Status: DC
  Administered 2015-01-22 – 2015-01-25 (×10): via TOPICAL
  Administered 2015-01-26: 1 via TOPICAL
  Administered 2015-01-26 – 2015-02-03 (×21): via TOPICAL
  Filled 2015-01-22 (×2): qty 15

## 2015-01-22 MED ORDER — ONDANSETRON HCL 4 MG PO TABS: 4.0000 mg | ORAL_TABLET | Freq: Four times a day (QID) | ORAL | Status: DC | PRN

## 2015-01-22 MED ORDER — INSULIN ASPART 100 UNIT/ML ~~LOC~~ SOLN
0.0000 [IU] | Freq: Three times a day (TID) | SUBCUTANEOUS | Status: DC
  Administered 2015-01-22 – 2015-01-23 (×2): 3 [IU] via SUBCUTANEOUS
  Administered 2015-01-23 (×2): 2 [IU] via SUBCUTANEOUS
  Administered 2015-01-24 – 2015-01-26 (×5): 3 [IU] via SUBCUTANEOUS
  Administered 2015-01-26 – 2015-01-27 (×2): 5 [IU] via SUBCUTANEOUS
  Administered 2015-01-27 – 2015-01-28 (×2): 8 [IU] via SUBCUTANEOUS
  Administered 2015-01-29 – 2015-01-30 (×3): 3 [IU] via SUBCUTANEOUS
  Administered 2015-01-30: 2 [IU] via SUBCUTANEOUS
  Administered 2015-01-31 – 2015-02-01 (×3): 3 [IU] via SUBCUTANEOUS
  Administered 2015-02-01: 2 [IU] via SUBCUTANEOUS
  Administered 2015-02-01 – 2015-02-02 (×3): 3 [IU] via SUBCUTANEOUS

## 2015-01-22 MED ORDER — INSULIN GLARGINE 100 UNIT/ML ~~LOC~~ SOLN
15.0000 [IU] | Freq: Every day | SUBCUTANEOUS | Status: DC
  Filled 2015-01-22 (×2): qty 0.15

## 2015-01-22 MED ORDER — NATEGLINIDE 120 MG PO TABS
120.0000 mg | ORAL_TABLET | Freq: Three times a day (TID) | ORAL | Status: DC
  Administered 2015-01-22 – 2015-02-03 (×35): 120 mg via ORAL
  Filled 2015-01-22 (×39): qty 1

## 2015-01-22 MED ORDER — PRO-STAT SUGAR FREE PO LIQD
30.0000 mL | Freq: Three times a day (TID) | ORAL | Status: DC
  Administered 2015-01-22 – 2015-01-23 (×4): 30 mL
  Filled 2015-01-22 (×5): qty 30

## 2015-01-22 MED ORDER — METRONIDAZOLE 500 MG PO TABS: 500.0000 mg | ORAL_TABLET | Freq: Three times a day (TID) | ORAL | Status: AC

## 2015-01-22 MED ORDER — PREDNISONE 10 MG PO TABS
60.0000 mg | ORAL_TABLET | Freq: Every day | ORAL | Status: DC
Start: 2015-01-23 — End: 2015-02-01
  Administered 2015-01-23 – 2015-02-01 (×10): 60 mg
  Filled 2015-01-22 (×11): qty 1

## 2015-01-22 MED ORDER — HYDROCOD POLST-CHLORPHEN POLST 10-8 MG/5ML PO LQCR: 5.0000 mL | Freq: Two times a day (BID) | ORAL | Status: DC | PRN

## 2015-01-22 MED ORDER — RIVAROXABAN 20 MG PO TABS
20.0000 mg | ORAL_TABLET | Freq: Every day | ORAL | Status: DC
  Administered 2015-01-22 – 2015-02-02 (×12): 20 mg
  Filled 2015-01-22 (×13): qty 1

## 2015-01-22 MED ORDER — JEVITY 1.2 CAL PO LIQD
1000.0000 mL | ORAL | Status: DC
  Administered 2015-01-22: 1000 mL
  Filled 2015-01-22 (×5): qty 1000

## 2015-01-22 MED ORDER — METRONIDAZOLE 500 MG PO TABS
500.0000 mg | ORAL_TABLET | Freq: Three times a day (TID) | ORAL | Status: AC
  Administered 2015-01-22 – 2015-01-27 (×14): 500 mg
  Filled 2015-01-22 (×15): qty 1

## 2015-01-22 MED ORDER — ALBUTEROL SULFATE (2.5 MG/3ML) 0.083% IN NEBU: 2.5000 mg | INHALATION_SOLUTION | RESPIRATORY_TRACT | Status: DC | PRN

## 2015-01-22 MED ORDER — FLUCONAZOLE 100 MG PO TABS
100.0000 mg | ORAL_TABLET | Freq: Every day | ORAL | Status: AC
  Administered 2015-01-22 – 2015-01-24 (×3): 100 mg via ORAL
  Filled 2015-01-22 (×3): qty 1

## 2015-01-22 MED ORDER — SACCHAROMYCES BOULARDII 250 MG PO CAPS: 250.0000 mg | ORAL_CAPSULE | Freq: Two times a day (BID) | ORAL | Status: DC

## 2015-01-22 MED ORDER — PRO-STAT SUGAR FREE PO LIQD: 30.0000 mL | Freq: Every day | ORAL | Status: DC

## 2015-01-22 MED ORDER — INSULIN GLARGINE 100 UNIT/ML ~~LOC~~ SOLN: 20.0000 [IU] | Freq: Every day | SUBCUTANEOUS | Status: DC

## 2015-01-22 MED ORDER — PYRIDOSTIGMINE BROMIDE 60 MG PO TABS
60.0000 mg | ORAL_TABLET | Freq: Four times a day (QID) | ORAL | Status: DC
  Administered 2015-01-22 – 2015-02-03 (×47): 60 mg via ORAL
  Filled 2015-01-22 (×51): qty 1

## 2015-01-22 MED ORDER — MUPIROCIN 2 % EX OINT: TOPICAL_OINTMENT | Freq: Every day | CUTANEOUS | Status: AC

## 2015-01-22 MED ORDER — GUAIFENESIN-DM 100-10 MG/5ML PO SYRP: 5.0000 mL | ORAL_SOLUTION | Freq: Four times a day (QID) | ORAL | Status: DC | PRN

## 2015-01-22 MED ORDER — BACITRACIN 500 UNIT/GM EX OINT
1.0000 "application " | TOPICAL_OINTMENT | Freq: Two times a day (BID) | CUTANEOUS | Status: DC
  Administered 2015-01-22 – 2015-01-23 (×3): 1 via TOPICAL
  Filled 2015-01-22 (×6): qty 0.9

## 2015-01-22 MED ORDER — DIPHENHYDRAMINE HCL 12.5 MG/5ML PO ELIX
12.5000 mg | ORAL_SOLUTION | Freq: Four times a day (QID) | ORAL | Status: DC | PRN
Start: 2015-01-22 — End: 2015-02-03

## 2015-01-22 MED ORDER — JEVITY 1.2 CAL PO LIQD: 1000.0000 mL | ORAL | Status: DC

## 2015-01-22 MED ORDER — AMIODARONE HCL 100 MG PO TABS
100.0000 mg | ORAL_TABLET | Freq: Every day | ORAL | Status: DC
  Administered 2015-01-23 – 2015-02-01 (×10): 100 mg
  Filled 2015-01-22 (×11): qty 1

## 2015-01-22 MED ORDER — INSULIN ASPART 100 UNIT/ML ~~LOC~~ SOLN
4.0000 [IU] | Freq: Three times a day (TID) | SUBCUTANEOUS | Status: DC
  Administered 2015-01-22: 4 [IU] via SUBCUTANEOUS

## 2015-01-22 NOTE — Progress Notes (Signed)
RT adjusted flutter/cpt schedule to give while awake only.  RT will continue to monitor for need.

## 2015-01-22 NOTE — H&P (Signed)
Physical Medicine and Rehabilitation Admission H&P   Chief Complaint  Patient presents with  . Recurrent MG crises    HPI: Craig [name] is a 74 y.o. male with history of AFib, bladder cancer, DM type 2, MG with recent flare who was recently discharged to home past CIR on 01/02/15. He was seen by outpatient neurologist on 02/26 with plans to start home IVIG but was readmitted on 01/10/15 with fatigue, worsening of SOB and hypoxia. HD Cath placed by CCM on 02/28 and neurology recommended IVIG X 5 treatments. CCM consulted for close monitoring of respiratory status due to concerns of requiring intubation and he was started on IV zosyn for PNA. He developed diarrhea with recurrent c diff and was started on metronidazole on 03/05. He has had neck discomfort due to HD cath requiring IV fentanyl for pain management. ST evaluation done yesterday and patient started on trials of ice chips with plans for objective swallow evaluation. He has completed his treatment course of IVIG today and is reporting increase in strength as well as swallow function. MBS done 03/08 and patient started on regular diet with thin liquids. Therapy ongoing and patient showing improvement in activity tolerance. CIR recommended by MD and Rehab team.    Review of Systems  HENT: Negative for hearing loss.  Eyes: Negative for blurred vision and double vision.  Respiratory: Positive for cough (dry intermittent). Negative for shortness of breath and wheezing.  Cardiovascular: Negative for chest pain and palpitations.  Gastrointestinal: Positive for diarrhea. Negative for heartburn and nausea.  Genitourinary: Positive for urgency and frequency.  Musculoskeletal: Negative for myalgias.  Neurological: Negative for dizziness and headaches.  Psychiatric/Behavioral: The patient is not nervous/anxious and does not have insomnia.      Past Medical History  Diagnosis Date  . Atrial fibrillation, chronic      on Xarelto   . HTN (hypertension)   . CHF (congestive heart failure)   . Diabetes mellitus   . 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 Myasthenia gravis     Past Surgical History  Procedure Laterality Date  . Abdominal wall metastasis removal      Family History  Problem Relation Age of Onset  . Colon cancer Mother   . Heart attack Father   . Diabetes Father     Social History: Married. Independent and active prior to last admission--Was d/c from CIR at supervision level. He does not use tobacco, alcohol or illicit drugs.    Allergies  Allergen Reactions  . Ibuprofen Other (See Comments)    Interacts with another medication patient is taking    Medications Prior to Admission  Medication Sig Dispense Refill  . amiodarone (PACERONE) 100 MG tablet Place 1 tablet (100 mg total) into feeding tube daily.    Marland Kitchen antiseptic oral rinse (CPC / CETYLPYRIDINIUM CHLORIDE 0.05%) 0.05 % LIQD solution 7 mLs by Mouth Rinse route 2 (two) times daily. 946 mL 1  . artificial tears (LACRILUBE) OINT ophthalmic ointment Place into both eyes every 4 (four) hours as needed for dry eyes.  0  . atorvastatin (LIPITOR) 40 MG tablet Place 1 tablet (40 mg total) into feeding tube daily.    . cephALEXin (KEFLEX) 250 MG/5ML suspension Place 5 mLs (250 mg total) into feeding tube every 8 (eight) hours. 35 mL 0  . famotidine (PEPCID) 40 MG/5ML suspension Take 5 mLs (40 mg total) by mouth daily. 150 mL 0  .  insulin aspart (NOVOLOG FLEXPEN) 100 UNIT/ML FlexPen Inject 0-10 Units into the skin 4 (four) times daily. Use according to scale prior to tube feeds. 15 mL 1  . ipratropium (ATROVENT HFA) 17 MCG/ACT inhaler Inhale 1 puff into the lungs every 6 (six) hours as needed for wheezing.    Marland Kitchen ipratropium (ATROVENT) 0.02 % nebulizer solution Inhale 2.5 mLs  (0.5 mg total) into the lungs every 6 (six) hours as needed (wHEEZING.). 75 mL 12  . metoprolol tartrate (LOPRESSOR) 25 MG tablet Place 0.5 tablets (12.5 mg total) into feeding tube daily.    . nateglinide (STARLIX) 120 MG tablet Place 1 tablet (120 mg total) into feeding tube 3 (three) times daily. 90 tablet 1  . Nutritional Supplements (FEEDING SUPPLEMENT, JEVITY 1.2 CAL,) LIQD Place 390 mLs into feeding tube 4 (four) times daily - with meals and at bedtime.  0  . potassium chloride 20 MEQ/15ML (10%) SOLN Place 15 mLs (20 mEq total) into feeding tube 3 (three) times daily. 3800 mL 0  . predniSONE (DELTASONE) 20 MG tablet Place 3 tablets (60 mg total) into feeding tube daily with breakfast. 150 tablet 1  . pyridostigmine (MESTINON) 60 MG tablet 1.5 tablets (90 mg total) by Per NG tube route 4 (four) times daily. 135 tablet 1  . rivaroxaban (XARELTO) 20 MG TABS tablet Take 1 tablet (20 mg total) by mouth daily with supper. (Patient taking differently: Place 20 mg into feeding tube daily with supper. )    . chlorhexidine (PERIDEX) 0.12 % solution 15 mLs by Mouth Rinse route 2 (two) times daily. 120 mL 0  . ondansetron (ZOFRAN) 4 MG tablet Take 1 tablet (4 mg total) by mouth every 6 (six) hours as needed for nausea. 20 tablet 0  . senna (SENOKOT) 8.6 MG TABS tablet Place 1 tablet (8.6 mg total) into feeding tube daily as needed for mild constipation. (Patient not taking: Reported on 01/10/2015) 120 each 0  . Water For Irrigation, Sterile (FREE WATER) SOLN Place 150 mLs into feeding tube 4 (four) times daily - after meals and at bedtime.      Home: Home Living Family/patient expects to be discharged to:: Inpatient rehab Living Arrangements: Spouse/significant other Available Help at Discharge: Family, Available 24 hours/day Type of Home: House Home Access: Stairs to enter [employer] of Steps: 3 Entrance Stairs-Rails: Right Home  Layout: One level, Laundry or work area in [place]: Environmental consultant - 2 wheels, Radio producer - single point, Bedside commode, Shower seat Lives With: Spouse  Functional History: Prior Function Level of Independence: Needs assistance Gait / Transfers Assistance Needed: RW for gait ADL's / Homemaking Assistance Needed: needs assist for showering Comments: Information since discharge home.  Functional Status:  Mobility: Bed Mobility Overal bed mobility: Needs Assistance Bed Mobility: Supine to Sit, Sit to Supine Supine to sit: Supervision, HOB elevated Sit to supine: Supervision General bed mobility comments: No physical assist required Transfers Overall transfer level: Needs assistance Equipment used: Rolling walker (2 wheeled) Transfers: Sit to/from Stand Sit to Stand: Min assist, From elevated surface Stand pivot transfers: Min guard General transfer comment: min assist to rise and gain balance Ambulation/Gait Ambulation/Gait assistance: Min assist Ambulation Distance (Feet): 18 Feet Assistive device: Rolling walker (2 wheeled) Gait Pattern/deviations: Step-to pattern, Decreased stride length, Drifts right/left, Trunk flexed Gait velocity: decreased Gait velocity interpretation: Below normal speed for age/gender General Gait Details: VCs for positioning with use of RW, cues for upright posture, patient with increased fatigue during ambulation but able  to complete tasks.    ADL: ADL Overall ADL's : Needs assistance/impaired Eating/Feeding: Set up, Bed level Eating/Feeding Details (indicate cue type and reason): oral feeds and peg Grooming: Minimal assistance, Sitting Upper Body Bathing: Minimal assitance, Sitting Lower Body Bathing: Moderate assistance, Sit to/from stand Lower Body Bathing Details (indicate cue type and reason): difficulty with releasing RW and maintinaing balance with peri care Upper Body Dressing : Minimal assistance, Sitting Lower Body Dressing:  Moderate assistance, Sit to/from stand Toilet Transfer: Minimal assistance, RW Toileting- Clothing Manipulation and Hygiene: Moderate assistance, Sit to/from stand General ADL Comments: Doffed sock, assisted to start over toes to donn. Reliant on at least one hand on walker for balance.  Cognition: Cognition Overall Cognitive Status: Impaired/Different from baseline Orientation Level: Oriented X4 Cognition Arousal/Alertness: Awake/alert Behavior During Therapy: Flat affect Overall Cognitive Status: Impaired/Different from baseline Area of Impairment: Safety/judgement, Problem solving Memory: Decreased short-term memory Safety/Judgement: Decreased awareness of safety Problem Solving: Slow processing, Requires verbal cues  Physical Exam: Blood pressure 119/64, pulse 94, temperature 97.4 F (36.3 C), temperature source Axillary, resp. rate 22, height 5' 7"  (1.702 m), weight 107.6 kg (237 lb 3.4 oz), SpO2 93 %. Physical Exam  Nursing note and vitals reviewed. Constitutional: He is oriented to person, place, and time. He appears well-developed and well-nourished.  Fatigue appearing.  HENT: oral mucosa pink Head: Normocephalic.  Abraded area on nasal bridge healing Eyes: Conjunctivae are normal. Pupils are equal, round, and reactive to light.  Neck: Normal range of motion. Neck supple.  Cardiovascular: Normal rate and regular rhythm. No murmur heard. Respiratory: Effort normal. He has decreased breath sounds in the right lower field. He has no wheezes. He has no rales. No distress GI: Soft. Bowel sounds are normal. He exhibits no distension. There is no tenderness.  PEG site clean and dry  Genitourinary:  Wet and chapped appearing scrotum and inguinal area. Condom cath in place. Rectal tube in place.  Musculoskeletal: He exhibits edema (pedally. ).  Neurological: He is alert and oriented to person, place, and time.  Voice stronger and handling oral secretions without difficulty.  Right facial weakness.Mild ptosis and dysarthria still but much more intelligible.  UES: grossly 4/5 prox to distal. LE: 2+ HF, 3/5 KE and ankle. No sensory deficits.   Able to follow commands without difficulty.  Skin: Skin is warm and dry.  Dry flaky skin on bilateral feet. Stasis changes BLE.    Lab Results Last 48 Hours    Results for orders placed or performed during the hospital encounter of 01/10/15 (from the past 48 hour(s))  Glucose, capillary Status: Abnormal   Collection Time: 01/19/15 12:15 PM  Result Value Ref Range   Glucose-Capillary 182 (H) 70 - 99 mg/dL  Glucose, capillary Status: Abnormal   Collection Time: 01/19/15 4:01 PM  Result Value Ref Range   Glucose-Capillary 235 (H) 70 - 99 mg/dL   Comment 1 Notify RN   Glucose, capillary Status: Abnormal   Collection Time: 01/19/15 8:32 PM  Result Value Ref Range   Glucose-Capillary 223 (H) 70 - 99 mg/dL  Glucose, capillary Status: Abnormal   Collection Time: 01/19/15 11:47 PM  Result Value Ref Range   Glucose-Capillary 178 (H) 70 - 99 mg/dL  Basic metabolic panel Status: Abnormal   Collection Time: 01/20/15 12:00 AM  Result Value Ref Range   Sodium 146 (H) 135 - 145 mmol/L   Potassium 3.5 3.5 - 5.1 mmol/L   Chloride 111 96 - 112 mmol/L   CO2  32 19 - 32 mmol/L   Glucose, Bld 133 (H) 70 - 99 mg/dL   BUN 17 6 - 23 mg/dL   Creatinine, Ser 0.56 0.50 - 1.35 mg/dL   Calcium 8.9 8.4 - 10.5 mg/dL   GFR calc non Af Amer >90 >90 mL/min   GFR calc Af Amer >90 >90 mL/min    Comment: (NOTE) The eGFR has been calculated using the CKD EPI equation. This calculation has not been validated in all clinical situations. eGFR's persistently <90 mL/min signify possible Chronic Kidney Disease.    Anion gap 3 (L) 5 - 15  Glucose, capillary Status: Abnormal   Collection Time: 01/20/15 3:45 AM  Result  Value Ref Range   Glucose-Capillary 130 (H) 70 - 99 mg/dL  Glucose, capillary Status: None   Collection Time: 01/20/15 8:28 AM  Result Value Ref Range   Glucose-Capillary 88 70 - 99 mg/dL  I-STAT, chem 8 Status: Abnormal   Collection Time: 01/20/15 9:54 AM  Result Value Ref Range   Sodium 148 (H) 135 - 145 mmol/L   Potassium 3.6 3.5 - 5.1 mmol/L   Chloride 104 96 - 112 mmol/L   BUN 17 6 - 23 mg/dL   Creatinine, Ser 0.80 0.50 - 1.35 mg/dL   Glucose, Bld 92 70 - 99 mg/dL   Calcium, Ion 1.34 (H) 1.13 - 1.30 mmol/L   TCO2 27 0 - 100 mmol/L   Hemoglobin 13.3 13.0 - 17.0 g/dL   HCT 39.0 39.0 - 52.0 %  Glucose, capillary Status: Abnormal   Collection Time: 01/20/15 4:16 PM  Result Value Ref Range   Glucose-Capillary 177 (H) 70 - 99 mg/dL  Glucose, capillary Status: Abnormal   Collection Time: 01/20/15 8:56 PM  Result Value Ref Range   Glucose-Capillary 213 (H) 70 - 99 mg/dL  Glucose, capillary Status: Abnormal   Collection Time: 01/20/15 10:43 PM  Result Value Ref Range   Glucose-Capillary 207 (H) 70 - 99 mg/dL  Comprehensive metabolic panel Status: Abnormal   Collection Time: 01/21/15 4:45 AM  Result Value Ref Range   Sodium 143 135 - 145 mmol/L   Potassium 3.7 3.5 - 5.1 mmol/L   Chloride 111 96 - 112 mmol/L   CO2 28 19 - 32 mmol/L   Glucose, Bld 136 (H) 70 - 99 mg/dL   BUN 15 6 - 23 mg/dL   Creatinine, Ser 0.60 0.50 - 1.35 mg/dL   Calcium 8.7 8.4 - 10.5 mg/dL   Total Protein 4.4 (L) 6.0 - 8.3 g/dL   Albumin 3.8 3.5 - 5.2 g/dL   AST 17 0 - 37 U/L   ALT 13 0 - 53 U/L   Alkaline Phosphatase 28 (L) 39 - 117 U/L   Total Bilirubin 0.8 0.3 - 1.2 mg/dL   GFR calc non Af Amer >90 >90 mL/min   GFR calc Af Amer >90 >90 mL/min    Comment: (NOTE) The eGFR has been calculated using the CKD EPI  equation. This calculation has not been validated in all clinical situations. eGFR's persistently <90 mL/min signify possible Chronic Kidney Disease.    Anion gap 4 (L) 5 - 15  Magnesium Status: None   Collection Time: 01/21/15 4:45 AM  Result Value Ref Range   Magnesium 1.7 1.5 - 2.5 mg/dL  CBC with Differential/Platelet Status: Abnormal   Collection Time: 01/21/15 4:45 AM  Result Value Ref Range   WBC 13.2 (H) 4.0 - 10.5 K/uL   RBC 4.26 4.22 - 5.81 MIL/uL   Hemoglobin  13.2 13.0 - 17.0 g/dL   HCT 40.4 39.0 - 52.0 %   MCV 94.8 78.0 - 100.0 fL   MCH 31.0 26.0 - 34.0 pg   MCHC 32.7 30.0 - 36.0 g/dL   RDW 17.3 (H) 11.5 - 15.5 %   Platelets 190 150 - 400 K/uL   Neutrophils Relative % 81 (H) 43 - 77 %   Neutro Abs 10.7 (H) 1.7 - 7.7 K/uL   Lymphocytes Relative 13 12 - 46 %   Lymphs Abs 1.7 0.7 - 4.0 K/uL   Monocytes Relative 6 3 - 12 %   Monocytes Absolute 0.8 0.1 - 1.0 K/uL   Eosinophils Relative 0 0 - 5 %   Eosinophils Absolute 0.0 0.0 - 0.7 K/uL   Basophils Relative 0 0 - 1 %   Basophils Absolute 0.0 0.0 - 0.1 K/uL  Phosphorus Status: None   Collection Time: 01/21/15 4:45 AM  Result Value Ref Range   Phosphorus 3.1 2.3 - 4.6 mg/dL  Glucose, capillary Status: Abnormal   Collection Time: 01/21/15 5:04 AM  Result Value Ref Range   Glucose-Capillary 137 (H) 70 - 99 mg/dL  Glucose, capillary Status: Abnormal   Collection Time: 01/21/15 8:15 AM  Result Value Ref Range   Glucose-Capillary 112 (H) 70 - 99 mg/dL       Medical Problem List and Plan: 1. Functional deficits secondary to MG crisis 2. DVT Prophylaxis/Anticoagulation: Pharmaceutical: Xarelto 3. Pain Management: Tylenol prn.  4. Mood: LCSW to follow for support. 5. Neuropsych: This patient is capable of making decisions on her own behalf. 6. Skin/Wound Care:  Routine pressure relief measures. Will add nystatin powder to help with moisture/yeast in peria[place].  7. Fluids/Electrolytes/Nutrition: Monitor I/O. Change tube feeds to nighttime to help with appetite during the day. Add water flushes.  8. DM type 2: Was on oral meds at home with good control. Will resume Starlix and taper lantus. May resume additional oral agent. Monitor BS with ac/hs checks. SSI for elevated BS. 12. A fib: Monitor heart rate every 8 hours. Continue amiodarone and Xarelto 13. 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 D/c rectal tube. Continue flagyl D  Add probiotic and fiber.     Post Admission Physician Evaluation: 1. Functional deficits secondary to MG crisis 2. Patient is admitted to receive collaborative, interdisciplinary care between the physiatrist, rehab nursing staff, and therapy team. 3. Patient's level of medical complexity and substantial therapy needs in context of that medical necessity cannot be provided at a lesser intensity of care such as a SNF. 4. Patient has experienced substantial functional loss from his/her baseline which was documented above under the "Functional History" and "Functional Status" headings. Judging by the patient's diagnosis, physical exam, and functional history, the patient has potential for functional progress which will result in measurable gains while on inpatient rehab. These gains will be of substantial and practical use upon discharge in facilitating mobility and self-care at the household level. 5. Physiatrist will provide 24 hour management of medical needs as well as oversight of the therapy plan/treatment and provide guidance as appropriate regarding the interaction of the two. 6. 24 hour rehab nursing will assist with bladder management, bowel management, safety, skin/wound care, disease management, medication administration and pain management and help integrate therapy concepts, techniques,education, etc. 7. PT will  assess and treat for/with: Lower extremity strength, range of motion, stamina, balance, functional mobility, safety, adaptive techniques and equipment, NMR, pain mgt, family and patient ed. Goals are: mod I. 8. OT  will assess and treat for/with: ADL's, functional mobility, safety, upper extremity strength, adaptive techniques and equipment, NMR, family and patient ed, ego support. Goals are: mod I to supervision. Therapy may proceed with showering this patient. 9. SLP will assess and treat for/with: speech, swallowing, communication. Goals are: mod I to min assist. 10. Case Management and Social Worker will assess and treat for psychological issues and discharge planning. 11. Team conference will be held weekly to assess progress toward goals and to determine barriers to discharge. 12. Patient will receive at least 3 hours of therapy per day at least 5 days per week. 13. ELOS: 7 days  14. Prognosis: excellent     Meredith Staggers, MD, [facility] 01/22/2015

## 2015-01-22 NOTE — Progress Notes (Signed)
Spoke with Craig [name] about wearing his BiPAP tonight, but stated that he didn't feel like wearing it. Pt had worn it semi-regularly last admission.

## 2015-01-22 NOTE — Discharge Summary (Signed)
Physician Discharge Summary  Craig [name] PXT:[id number] DOB: 12-May-1941 DOA: 01/10/2015  PCP: Ann Held, MD  Admit date: 01/10/2015 Discharge date: 01/22/2015  Time spent: >30 minutes  Recommendations for Outpatient Follow-up:  1. Close monitoring of patient's electrolytes 2. Antibiotics to be continued until 01/31/15 3. Avoid the use of PPIs nightly positive C. Difficile 4. Will require outpatient follow-up with urology/oncology and also neurology service.  Discharge Diagnoses:  Active Problems:   Myasthenia   Acute respiratory failure   Collapse   Respiratory abnormality   C. 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 Patient will be discharged to inpatient rehabilitation unit for further rehabilitation.  Diet recommendation: Heart healthy diet and low carbohydrates  Filed Weights   01/19/15 0401 01/20/15 0500 01/21/15 0500  Weight: 106.8 kg (235 lb 7.2 oz) 106.3 kg (234 lb 5.6 oz) 107.6 kg (237 lb 3.4 oz)    History of present illness:  [name] never smoker, with Hx of MG with recent flare and prolonged admission to [facility] who was admitted on 2/27 with worsening SOB, fatigue and low oxygen saturations. Recently discharged 2/19 from CIR after MG exacerbation, during that admission he required required intubation / PLEX rx.   Hospital Course:  Myasthenia Gravis with Acute Exacerbation  Neurology has now signed off - PLEX course has been completed  -S/p IVIG 2/28  - Daily NIF stable -Orbital much improved and now ready for rehab  -Continue prednisone, Mestinon and Starlix -Outpatient follow-up with neurology  Acute Respiratory Failure - in setting of MG exacerbation -off daytime NIMV - continues on nocturnal BIPAP for obstructive sleep  apnea -appears stable from respiratory standpoint   C diff collitis v/s colonization of colon  -ont flagyl tx - continues to have diarrhea, but this could be due to his tube feeds  -he completed a 14 day course of flagyl w/ last dose on 12/24/14  -on 3/4 repeated C. Diff by PCR was positive -Continue enteric precaution/isolated -flagyl tx for a possible first recurrence (through 3/19) -Will add in [place] store or diarrhea and avoid PPIs  GERD -continue famotidine   OSA -n bipap 18/8 at baseline -Currently no using it all nights due to pain in his nose, rom wounds.  Chronic Atrial Fibrillation -on Xarelto and oral amio - well controlled   HTN -BP currently well controlled  -Continue current antihypertensive regimen  HLD -Okay to resume Lipitor    Hypophosphatemia Resolved   Hypokalemia  Due to GI losses w/ ongoing diarrhea - replaced to normal range   Metastatic Bladder Cancer -bladder cancer diagnosed in 2014, underwent resection, followed by chemotherapy for metastatic lesion to his abdominal wall, finished chemotherapy in October 2015 - CT 2/11 at CIR with progressive omental / peritoneal metastasis -Outpatient/oncology follow-up with urology service  Chronic Dysphagia  -s/p PEG - bolus feeding at home with reported reflux - now on continuous feeds and tolerating well - SLP has cleared for a diet - no contraindication or difficulty with current diet- plan to continue tube feeds for now to allow pt to catch up w/ caloric needs  -may consider weaning tube feeds after a week or so of consistent oral intake   Possible aspiration PNA v/s atx -Given recent findings of atelectasis seen on chest x-ray -Patient with  ongoing C. difficile infection and no convincing pneumonia features other than the atelectasis changes seen on chest x-ray -Will monitor for now off broad-spectrum antibiotics as initially initiated.  Nasal Stage II Pressure Ulcer -present on admission, was  dry scab, open with increase bipap use  - slowly improving - pt denies pain in wound, and feels clan and dry -continue application daily of Bactroban  it is getting better   Uncontrolled DM  CBG variable -  -continue lantus and SSI; adjust insulin therapy as needed base on CBG fluctuation   Procedures: 2/27 Admit with worsening SOB, fatigue and low O2 saturations. NIF -30, -22, -20. VC 450, 700, 610 ml 2/28 PCCM consulted for evaluation of worsening respiratory status. NIF -40, VC 1.6L 2/29 CXR showing R basilar atx 3/3 no daytime NIMV needed  Consultations:  Neurology  PCCM  Discharge Exam: Filed Vitals:   01/22/15 0955  BP: 115/64  Pulse: 79  Temp: 98.1 F (36.7 C)  Resp: 18    General: Alert, awake and oriented 3; no acute complaints. Patient denies shortness of breath or chest pain. Cardiovascular: S1 and S2. No rubs, no gallops, no murmurs. Respiratory: Good air movement bilaterally, without wheeze no wheezing; scattered rhonchi Abdomen: Soft, nontender, positive bowel sounds; positive PEG tube in place without any signs of bleeding infection or drainage Extremities: Trace edema bilaterally, no cyanosis or clubbing  Discharge Instructions   Discharge Instructions    Diet - low sodium heart healthy    Complete by:  As directed      Discharge instructions    Complete by:  As directed   Maintain good hydration Take medications as prescribed Continue treatment for C. Diff until 3/19 Close follow up of his electrolytes levels and repletion as needed Will need outpatient follow up for further evaluation and treatment of his Myasthenia Gravis          Discharge Medication List as of 01/22/2015  2:37 PM    CONTINUE these medications which have NOT CHANGED   Details  amiodarone (PACERONE) 100 MG tablet Place 1 tablet (100 mg total) into feeding tube daily., Starting 12/29/2014, Until Discontinued, No Print    antiseptic oral rinse (CPC / CETYLPYRIDINIUM  CHLORIDE 0.05%) 0.05 % LIQD solution 7 mLs by Mouth Rinse route 2 (two) times daily., Starting 01/02/2015, Until Discontinued, No Print    artificial tears (LACRILUBE) OINT ophthalmic ointment Place into both eyes every 4 (four) hours as needed for dry eyes., Starting 12/29/2014, Until Discontinued, No Print    atorvastatin (LIPITOR) 40 MG tablet Place 1 tablet (40 mg total) into feeding tube daily., Starting 12/29/2014, Until Discontinued, No Print    cephALEXin (KEFLEX) 250 MG/5ML suspension Place 5 mLs (250 mg total) into feeding tube every 8 (eight) hours., Starting 01/02/2015, Until Discontinued, Normal    chlorhexidine (PERIDEX) 0.12 % solution 15 mLs by Mouth Rinse route 2 (two) times daily., Starting 12/29/2014, Until Discontinued, Normal    famotidine (PEPCID) 40 MG/5ML suspension Take 5 mLs (40 mg total) by mouth daily., Starting 01/02/2015, Until Discontinued, Normal    insulin aspart (NOVOLOG FLEXPEN) 100 UNIT/ML FlexPen Inject 0-10 Units into the skin 4 (four) times daily. Use according to scale prior to tube feeds., Starting 01/02/2015, Until Discontinued, Normal    ipratropium (ATROVENT HFA) 17 MCG/ACT inhaler Inhale 1 puff into the lungs every 6 (six) hours as needed for wheezing., Until Discontinued, Historical Med    ipratropium (ATROVENT) 0.02 % nebulizer solution Inhale 2.5 mLs (0.5 mg total)  into the lungs every 6 (six) hours as needed (wHEEZING.)., Starting 12/29/2014, Until Discontinued, Normal    metoprolol tartrate (LOPRESSOR) 25 MG tablet Place 0.5 tablets (12.5 mg total) into feeding tube daily., Starting 12/29/2014, Until Discontinued, No Print    nateglinide (STARLIX) 120 MG tablet Place 1 tablet (120 mg total) into feeding tube 3 (three) times daily., Starting 01/02/2015, Until Discontinued, Normal    Nutritional Supplements (FEEDING SUPPLEMENT, JEVITY 1.2 CAL,) LIQD Place 390 mLs into feeding tube 4 (four) times daily -  with meals and at bedtime., Starting 01/02/2015, Until  Discontinued, No Print    ondansetron (ZOFRAN) 4 MG tablet Take 1 tablet (4 mg total) by mouth every 6 (six) hours as needed for nausea., Starting 01/02/2015, Until Discontinued, Normal    potassium chloride 20 MEQ/15ML (10%) SOLN Place 15 mLs (20 mEq total) into feeding tube 3 (three) times daily., Starting 01/02/2015, Until Discontinued, Normal    predniSONE (DELTASONE) 20 MG tablet Place 3 tablets (60 mg total) into feeding tube daily with breakfast., Starting 01/02/2015, Until Discontinued, Normal    pyridostigmine (MESTINON) 60 MG tablet 1.5 tablets (90 mg total) by Per NG tube route 4 (four) times daily., Starting 01/02/2015, Until Discontinued, Normal    rivaroxaban (XARELTO) 20 MG TABS tablet Take 1 tablet (20 mg total) by mouth daily with supper., Starting 12/29/2014, Until Discontinued, No Print    senna (SENOKOT) 8.6 MG TABS tablet Place 1 tablet (8.6 mg total) into feeding tube daily as needed for mild constipation., Starting 01/02/2015, Until Discontinued, Normal    Water For Irrigation, Sterile (FREE WATER) SOLN Place 150 mLs into feeding tube 4 (four) times daily - after meals and at bedtime., Starting 01/02/2015, Until Discontinued, No Print       Allergies  Allergen Reactions  . Ibuprofen Other (See Comments)    Interacts with another medication patient is taking   Follow-up Information    Follow up with Ann Held, MD.   Specialty:  Family Medicine   Why:  in 2 weeks of been discharge from inpatient unit   Contact information:   [place] [address] [phone]       Follow up with Marcial Pacas, MD.   Specialty:  Neurology   Why:  in 1-2 weeks after discharge from inpatient rehab   Contact information:   [place] [address] [phone]       The results of significant diagnostics from this hospitalization (including imaging, microbiology, ancillary and laboratory) are listed below for reference.    Significant  Diagnostic Studies: Ct Abdomen Wo Contrast  01/02/2015   CLINICAL DATA:  Initial encounter for evaluate anatomy secondary to G-tube placement. 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 Dysphagia.  EXAM: CT ABDOMEN WITHOUT CONTRAST  TECHNIQUE: Multidetector CT imaging of the abdomen was performed following the standard protocol without IV contrast.  COMPARISON:  12/02/2014  FINDINGS: Lower chest: Dependent left base atelectasis. Trace left pleural fluid or thickening, new. Mild cardiomegaly with coronary artery atherosclerosis.  Hepatobiliary: Old granulomatous disease in the liver. Contracted gallbladder, without biliary ductal dilatation.  Pancreas: Fatty replacement involving the pancreatic head and uncinate process. No duct dilatation or acute pancreatitis.  Spleen: Normal  Adrenals/Urinary Tract: Normal adrenal glands. No renal calculi or hydronephrosis. No abdominal hydroureter or ureteric stone.  Stomach/Bowel: Nasogastric tube terminates at the body of the stomach. The gastric body is positioned anteriorly, without interposition of colon or small bowel. There is no omental  nodularity in the region of the greater curvature of the stomach. No abnormal gastric wall thickening.  Extensive colonic diverticulosis. Contrast identified throughout the colon. Small bowel loops and appendix are not well opacified. Small bowel is normal in caliber.  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 1.9 cm on image 34 versus similar (when remeasured). Aortic and branch vessel atherosclerosis. No retroperitoneal or retrocrural adenopathy.  Other: No ascites. Omental nodularity is again identified. An 11 mm nodule on image 54 likely represents enlargement of a 3 mm nodule on image 51 of the prior exam. Right-sided abdominal omental nodule measures 1.5 cm on image 50, enlarged from 9 mm on the prior exam (when remeasured).  Musculoskeletal: Degenerative disc disease,  most advanced at L4-5 and L1-2. Convex right lumbar spine curvature.  IMPRESSION: 1. Progressive omental/peritoneal metastasis. 2. No disease identified along the anterior aspect of the stomach to preclude gastrostomy tube placement. 3. Celiac origin aneurysm, similar. 4. Left base atelectasis with adjacent trace left pleural fluid or thickening, new.   Electronically Signed   By: Abigail Miyamoto M.D.   On: 12/25/2014 16:34   Dg Chest 2 View  01/10/2015   CLINICAL DATA:  Chronic shortness of breath for 2 years. Cough last 24 hr.  EXAM: CHEST  2 VIEW  COMPARISON:  12/28/2014  FINDINGS: Left Port-A-Cath remains in place, unchanged. Right central line has been removed. There is cardiomegaly. No confluent airspace opacities or effusions. Mild elevation of the right hemidiaphragm, stable.  IMPRESSION: Cardiomegaly.  No active disease.   Electronically Signed   By: Rolm Baptise M.D.   On: 01/10/2015 18:01   Ct Angio Chest Pe W/cm &/or Wo Cm  12/27/2014   CLINICAL DATA:  Patient complaining of chest pain and left shoulder pain. Diaphoretic.  EXAM: CT ANGIOGRAPHY CHEST WITH CONTRAST  TECHNIQUE: Multidetector CT imaging of the chest was performed using the standard protocol during bolus administration of intravenous contrast. Multiplanar CT image reconstructions and MIPs were obtained to evaluate the vascular anatomy.  CONTRAST:  16mL OMNIPAQUE IOHEXOL 350 MG/ML SOLN  COMPARISON:  Chest radiograph 12/27/2014; abdomen pelvic CT 12/25/2014  FINDINGS: Adequate opacification of the pulmonary arterial system. Evaluation of the subsegmental pulmonary arteries limited due to motion artifact. Within the above limitation, no definite evidence for pulmonary embolism.  Left anterior chest wall Port-A-Cath is present with tip terminating in the superior vena cava. Right-sided central venous catheter is present with tip terminating at the superior cavoatrial junction. Heart is enlarged. No pericardial effusion. Coronary arterial  vascular calcifications. No axillary, mediastinal or hilar lymphadenopathy. Small hiatal hernia.  The central airways are patent. There is dependent atelectasis within the bilateral lower lobes. No definite pleural effusion or pneumothorax.  Visualization of the upper abdomen demonstrates interval placement of gastrostomy tube. Tube appears in appropriate position. There is a small amount of free intraperitoneal air adjacent to the anterior aspect of the stomach, likely postprocedural in etiology. Small gallstone within the gallbladder lumen. Unchanged bilateral adrenal thickening. Re- demonstrated omental nodularity. 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  Degenerative changes of the thoracic spine without aggressive or acute appearing osseous lesions.  Review of the MIP images confirms the above findings.  IMPRESSION: No evidence for pulmonary embolism. Limited evaluation of the subsegmental pulmonary arteries due to motion artifact.  Interval placement of gastrostomy tube. Small amount of free intraperitoneal air adjacent to the anterior margin the stomach, likely postprocedural in etiology.  Re- demonstrated omental nodularity, compatible with metastatic  disease.  Celiac origin aneurysm, stable.  Bilateral lower lobe atelectatic changes.  Critical Value/emergent results were called by telephone at the time of interpretation on 12/27/2014 at 8:00 am to Nurse [facility] , who verbally acknowledged these results and will page Dr. Dyann Kief.   Electronically Signed   By: Lovey Newcomer M.D.   On: 12/27/2014 08:03   Ir Gastrostomy Tube Mod Sed  12/26/2014   CLINICAL DATA:  Myasthenia gravis, dysphasia, needs enteral feeding support.  EXAM: PERC PLACEMENT GASTROSTOMY  FLUOROSCOPY TIME:  1 minutes 48 seconds  TECHNIQUE: The procedure, risks, benefits, and alternatives were explained to the patient. Questions regarding the procedure were encouraged and answered. The patient understands and consents to the  procedure. As antibiotic prophylaxis, cefazolin was ordered pre-procedure and administered intravenously within one hour of incision.Progression of previously administered oral barium into the colon was confirmed fluoroscopically. A 5 French angiographic catheter was placed as orogastric tube. The upper abdomen was prepped with Betadine, draped in usual sterile fashion, and infiltrated locally with 1% lidocaine.  Intravenous Fentanyl and Versed were administered as conscious sedation during continuous cardiorespiratory monitoring by the radiology RN, with a total moderate sedation time of ten minutes.  Stomach was insufflated using air through the orogastric tube. An 40 French sheath needle was advanced percutaneously into the gastric lumen under fluoroscopy. Gas could be aspirated and a small contrast injection confirmed intraluminal spread. The sheath was exchanged over a guidewire for a 9 [place] vascular sheath, through which the snare device was advanced and used to snare a guidewire passed through the orogastric tube. This was withdrawn, and the snare attached to the 20 French pull-through gastrostomy tube, which was advanced antegrade, positioned with the internal bumper securing the anterior gastric wall to the anterior abdominal wall. Small contrast injection confirms appropriate positioning. The external bumper was applied and the catheter was flushed.  COMPLICATIONS: COMPLICATIONS none  IMPRESSION: 1. Technically successful 20 French pull-through gastrostomy placement under fluoroscopy.   Electronically Signed   By: Lucrezia Europe M.D.   On: 12/26/2014 11:26   Dg Chest Port 1 View  01/16/2015   CLINICAL DATA:  Shortness of breath for 1 week  EXAM: PORTABLE CHEST - 1 VIEW  COMPARISON:  01/15/2015  FINDINGS: Cardiomegaly again noted. Study is limited by poor inspiration with bilateral basilar atelectasis. Stable right IJ central line with tip in SVC. Stable left Port-A-Cath with tip in SVC right atrium  junction. No segmental infiltrate or pulmonary edema.  IMPRESSION: Cardiomegaly again noted. Stable right IJ catheter and left Port-A-Cath position. Bilateral basilar atelectasis. No segmental infiltrate or pulmonary edema.   Electronically Signed   By: Lahoma Crocker M.D.   On: 01/16/2015 15:12   Dg Chest Port 1 View  01/15/2015   CLINICAL DATA:  Acute respiratory failure  EXAM: PORTABLE CHEST - 1 VIEW  COMPARISON:  Portable chest x-ray of 01/14/2015  FINDINGS: The lungs remain poorly aerated with bibasilar atelectasis present. The right central venous line tip overlies the mid lower SVC. Left-sided power port Port-A-Cath tip is near the expected SVC -RA junction. Cardiomegaly is stable.  IMPRESSION: Poor inspiration with bibasilar atelectasis.   Electronically Signed   By: Ivar Drape M.D.   On: 01/15/2015 07:15   Dg Chest Port 1 View  01/14/2015   CLINICAL DATA:  Cough.  Shortness of breath.  EXAM: PORTABLE CHEST - 1 VIEW  COMPARISON:  01/13/2015.  FINDINGS: Right IJ line, left power port catheter in stable position. Mediastinum and  hilar structures are normal. Stable cardiomegaly. Normal pulmonary vascularity. Persistent bibasilar atelectasis and/or mild infiltrates.  IMPRESSION: 1. Right IJ line and left PowerPort catheter is in stable position. 2. Stable bibasilar atelectasis and/or infiltrates. No other change.   Electronically Signed   By: Marcello Moores  Register   On: 01/14/2015 07:16   Dg Chest Port 1 View  01/13/2015   CLINICAL DATA:  Collapse  EXAM: PORTABLE CHEST - 1 VIEW  COMPARISON:  01/12/2015  FINDINGS: Cardiac shadow is stable. A left chest wall port and right temporary dialysis catheter are again seen. Bibasilar atelectatic changes are noted. No pneumothorax is seen. No sizable effusion is noted.  IMPRESSION: Bibasilar atelectatic changes.  These results will be called to the ordering clinician or representative by the Radiologist Assistant, and communication documented in the PACS or zVision  Dashboard.   Electronically Signed   By: Inez Catalina M.D.   On: 01/13/2015 10:00   Dg Chest Port 1 View  01/12/2015   CLINICAL DATA:  Acute respiratory failure, shortness of breath, history of CHF myasthenia gravis, bladder malignancy.  EXAM: PORTABLE CHEST - 1 VIEW  COMPARISON:  Portable chest x-ray of January 11, 2015  FINDINGS: The lungs are reasonably well inflated. There is bibasilar atelectasis unchanged. The cardiac silhouette is top-normal in size. The pulmonary vascularity is normal. The Port-A-Cath appliance tip projects over the junction of the middle and distal thirds of the SVC. The right internal jugular dialysis catheter tip projects over the midportion of the SVC.  IMPRESSION: There has not been significant interval change in the appearance of the chest since yesterday's study. There remains bibasilar atelectasis and possible small bilateral pleural effusions.   Electronically Signed   By: David  Martinique   On: 01/12/2015 07:14   Dg Chest Port 1 View  01/11/2015   CLINICAL DATA:  Acute respiratory failure. Hemodialysis catheter insertion.  EXAM: PORTABLE CHEST - 1 VIEW  COMPARISON:  01/11/2015  FINDINGS: Right dialysis catheter has been placed with the tip in the SVC. Left Port-A-Cath is unchanged. No pneumothorax. Mild cardiomegaly. Left basilar atelectasis. Minimal right base atelectasis. No overt edema.  IMPRESSION: Right dialysis catheter placement with the tip in the SVC. No pneumothorax.  Bibasilar atelectasis, left greater than right.   Electronically Signed   By: Rolm Baptise M.D.   On: 01/11/2015 15:15   Dg Chest Port 1 View  01/11/2015   CLINICAL DATA:  Short of breath was congestion  EXAM: PORTABLE CHEST - 1 VIEW  COMPARISON:  Radiograph 01/10/2015  FINDINGS: Left power port. Normal cardiac silhouette. Low lung volumes. There is left basilar atelectasis. No effusion or infiltrate. No pneumothorax.  IMPRESSION: Low lung volumes and left basilar atelectasis. No significant change.    Electronically Signed   By: Suzy Bouchard M.D.   On: 01/11/2015 09:00   Dg Chest Port 1 View  12/28/2014   CLINICAL DATA:  Cough and shortness of breath today.  EXAM: PORTABLE CHEST - 1 VIEW  COMPARISON:  View of the chest CT chest 12/27/2014.  FINDINGS: Right IJ catheter and left subclavian approach Port-A-Cath are again seen. Lung volumes are lower than on comparison plain film of the chest. The lungs appear clear. There is cardiomegaly. No pneumothorax or pleural effusion.  IMPRESSION: Cardiomegaly without acute disease.   Electronically Signed   By: Inge Rise M.D.   On: 12/28/2014 22:37   Dg Chest Port 1 View  12/27/2014   CLINICAL DATA:  Acute onset of chest pain and  shortness of breath. Initial encounter.  EXAM: PORTABLE CHEST - 1 VIEW  COMPARISON:  Chest radiograph performed 12/11/2014  FINDINGS: The lungs are hypoexpanded. Minimal left basilar opacity likely reflects atelectasis. There is no evidence of pleural effusion or pneumothorax.  The cardiomediastinal silhouette is borderline enlarged. A right IJ dual lumen catheter seen ending about the mid SVC. A left-sided chest port is noted ending about the distal SVC. No acute osseous abnormalities are seen.  IMPRESSION: Lungs hypoexpanded. Minimal left basilar opacity likely reflects atelectasis; lungs otherwise grossly clear.   Electronically Signed   By: Garald Balding M.D.   On: 12/27/2014 02:55   Dg Swallowing Func-speech Pathology  01/20/2015   Barbette Reichmann Deblois, CCC-SLP     01/20/2015  2:45 PM  Objective Swallowing Evaluation: Modified Barium Swallowing Study   Patient Details  Name: Craig [name] MRN: [id number] Date of Birth: Jun 08, 1941  Today's Date: 01/20/2015 Time: SLP Start Time (ACUTE ONLY): 1340-SLP Stop Time (ACUTE  ONLY): 1400 SLP Time Calculation (min) (ACUTE ONLY): 20 min  Past Medical History:  Past Medical History  Diagnosis Date  . Atrial fibrillation, chronic     on Xarelto   . HTN (hypertension)   . CHF (congestive heart  failure)   . Diabetes mellitus   . 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 Myasthenia gravis    Past Surgical History:  Past Surgical History  Procedure Laterality Date  . Abdominal wall metastasis removal     HPI:  HPI: [name], never smoker, transferred to [facility] on 1/22 with acute  MG exacerbation & respiratory failure. Decompensated requiring  intubation 1/22-1/25.  The patient was subsequently transferred  to the rehab unit for several weeks of therapy and was home for  approximately a week prior to readmission for trouble breathing.   The patient's wife reports that [facility] had seen the patient for an  evaluation only and that therapy had not begun.  The patient is  currently NPO receiving continuous PEG feeds.  No Data Recorded  Assessment / Plan / Recommendation CHL IP CLINICAL IMPRESSIONS 01/20/2015  Dysphagia Diagnosis (None)  Clinical impression Pt presents with mild oral dysphagia; pt used  backward head tilt intermittently to propel bolus to the back of  oral cavity. Pharyngeal phase dysphagia characterized by base of  tongue weakness, and delayed swallow initiation. No aspiration or  penetration observed across consistencies. Recommend pt initiate  regular/ thin liquid diet. Recommend consult with dietician to  determine need for continued PEG tube feed as oral intake  improves. Speech will follow-up for diet tolerance.       CHL IP TREATMENT RECOMMENDATION 01/20/2015  Treatment Plan Recommendations Therapy as outlined in treatment  plan below     CHL IP DIET RECOMMENDATION 01/20/2015  Diet Recommendations Regular;Thin liquid  Liquid Administration via Cup;Straw  Medication Administration Whole meds with puree  Compensations Slow rate;Small sips/bites  Postural Changes and/or Swallow Maneuvers Seated upright 90  degrees     CHL IP OTHER RECOMMENDATIONS 01/20/2015  Recommended Consults (None)  Oral Care Recommendations Oral care Q4 per protocol  Other  Recommendations (None)     CHL IP FOLLOW UP RECOMMENDATIONS 01/20/2015  Follow up Recommendations Inpatient Rehab     CHL IP FREQUENCY AND DURATION 01/20/2015  Speech Therapy Frequency (ACUTE ONLY) min 2x/week  Treatment Duration 2 weeks     Pertinent Vitals/Pain NA    SLP Swallow Goals No flowsheet data found.  No flowsheet data found.    CHL IP REASON FOR REFERRAL 12/25/2014  Reason for Referral Objectively evaluate swallowing function     CHL IP ORAL PHASE 01/20/2015  Lips (None)  Tongue (None) Mucous membranes (None)  Nutritional status (None)  Other (None)  Oxygen therapy (None)  Oral Phase Impaired  Oral - Pudding Teaspoon (None)  Oral - Pudding Cup (None)  Oral - Honey Teaspoon NT  Oral - Honey Cup (None)  Oral - Honey Syringe (None)  Oral - Nectar Teaspoon NT  Oral - Nectar Cup (None)  Oral - Nectar Straw (None)  Oral - Nectar Syringe (None)  Oral - Ice Chips (None)  Oral - Thin Teaspoon NT  Oral - Thin Cup Delayed oral transit  Oral - Thin Straw Delayed oral transit  Oral - Thin Syringe NT  Oral - Puree Delayed oral transit  Oral - Mechanical Soft (None) Oral - Regular Delayed oral transit   Oral - Multi-consistency (None)  Oral - Pill (None)  Oral Phase - Comment (None)      CHL IP PHARYNGEAL PHASE 01/20/2015  Pharyngeal Phase Impaired  Pharyngeal - Pudding Teaspoon (None)  Penetration/Aspiration details (pudding teaspoon) (None)  Pharyngeal - Pudding Cup (None)  Penetration/Aspiration details (pudding cup) (None)  Pharyngeal - Honey Teaspoon NT  Penetration/Aspiration details (honey teaspoon) (None)  Pharyngeal - Honey Cup (None)  Penetration/Aspiration details (honey cup) (None)  Pharyngeal - Honey Syringe (None)  Penetration/Aspiration details (honey syringe) (None)  Pharyngeal - Nectar Teaspoon NT  Penetration/Aspiration details (nectar teaspoon) (None)  Pharyngeal - Nectar Cup (None)  Penetration/Aspiration details (nectar cup) (None)  Pharyngeal - Nectar Straw (None)  Penetration/Aspiration details (nectar  straw) (None)  Pharyngeal - Nectar Syringe (None)  Penetration/Aspiration details (nectar syringe) (None)  Pharyngeal - Ice Chips (None)  Penetration/Aspiration details (ice chips) (None)  Pharyngeal - Thin Teaspoon NT  Penetration/Aspiration details (thin teaspoon) (None) Pharyngeal  - Thin Cup Delayed swallow initiation;Pharyngeal residue -  valleculae  Penetration/Aspiration details (thin cup) (None)  Pharyngeal - Thin Straw Delayed swallow initiation;Pharyngeal  residue - valleculae  Penetration/Aspiration details (thin straw) (None)  Pharyngeal - Thin Syringe (None)  Penetration/Aspiration details (thin syringe') (None)  Pharyngeal - Puree Delayed swallow initiation;Pharyngeal residue  - valleculae  Penetration/Aspiration details (puree) (None)  Pharyngeal - Mechanical Soft (None)  Penetration/Aspiration details (mechanical soft) (None)  Pharyngeal - Regular Delayed swallow initiation;Pharyngeal  residue - valleculae  Penetration/Aspiration details (regular) (None)  Pharyngeal - Multi-consistency (None)  Penetration/Aspiration details (multi-consistency) (None)  Pharyngeal - Pill (None)  Penetration/Aspiration details (pill) (None)  Pharyngeal Comment (None)     CHL IP CERVICAL ESOPHAGEAL PHASE 01/20/2015  Cervical Esophageal Phase WFL  Pudding Teaspoon (None)  Pudding Cup (None)  Honey Teaspoon (None)  Honey Cup (None)  Honey Syringe (None)  Nectar Teaspoon (None)  Nectar Cup (None)  Nectar Straw (None)  Nectar Syringe (None)  Thin Teaspoon (None)  Thin Cup (None)  Thin Straw (None)  Thin Syringe (None)  Cervical Esophageal Comment (None)    No flowsheet data found.  Rodena Goldmann, SLP Student      Supervised and reviewed by Herbie Baltimore MA CCC-SLP  DeBlois, Katherene Ponto 01/20/2015, 2:43 PM    Dg Swallowing Func-speech Pathology  12/25/2014    Objective Swallowing Evaluation:    Patient Details  Name: Craig [name] MRN: [id number] Date of Birth: 1941-04-28  Today's Date: 12/25/2014 Time: SLP Start Time:  0930-SLP Stop Time: 1000 SLP Time Calculation (min) (ACUTE ONLY): 30 min  Past Medical  History:  Past Medical History  Diagnosis Date  . Atrial fibrillation, chronic     on Xarelto   . HTN (hypertension)   . CHF (congestive heart failure)   . Diabetes mellitus   . 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 Abdominal wall metastasis removal     HPI:  HPI: [name], never smoker, transferred to [facility] on 1/22 with acute MG  exacerbation & respiratory failure. Decompensated requiring intubation  1/22-1/25.  Now NPO with NG tube due to continued overt s/s of aspiration  during PO trials .  MBS ordered today to objectively determine readiness  for initiation of PO diet.      Assessment / Plan / Recommendation CHL IP CLINICAL IMPRESSIONS 12/25/2014  Dysphagia Diagnosis Severe pharyngeal phase dysphagia;Moderate oral phase  dysphagia  Clinical impression Pt presents with a moderately severe oropharyngeal  dysphagia with both sensory and motor components.  Oral phase impairments  are characterized by generalized weakness, most pronounced in lingual  strength and range of motion.  This resulted in weak manipulation of  boluses with reduced posterior propulsion of materials to the oropharynx.   Pt also presents with decreased base of tongue retraction, decreased,  hyolaryngeal excursion, and reduced pharyngeal constriction to move  materials through the pharynx and achieve adequate airway protection.  The  abovementioned deficits resulted in residue at the base of tongue,  posterior pharyngeal wall, vallecula and the pyriforms, as well as delayed  swallow initiation with swallow response triggered most consistently at  the pyriforms.  Delayed swallow initiation resulted in aspiration of honey  thick, nectar thick, and thin liquids which pt was unable to effectively  clear via reflexive or volitional coughs.   Cuing for extra effortful  swallows was largely ineffective for clearing residue from the pharynx.   Pt also demonstrated deep penetration to the level of the cords with  pureed solid consistencies and increased pyriform residue which increases  his risk of aspiration post swallow.  Given the abovementioned deficits,  pt's significantly weakened state, and his anxiety regarding PO intake,  recommend that pt remain NPO and that medical team consider PEG placement.   PA made aware.  Prognosis for advancement good with continued trials of  ice chips and time for recovery.  Pharyngeal strengthening exercises are  most likely contraindicated due to the progressive nature of pt's  weakness.         CHL IP TREATMENT RECOMMENDATION 12/12/2014  Treatment Plan Recommendations See plan of care      CHL IP DIET RECOMMENDATION 12/25/2014  Diet Recommendations Alternative means - long-term;NPO  Liquid Administration via (None)  Medication Administration Via alternative means  Compensations (None)  Postural Changes and/or Swallow Maneuvers (None)     CHL IP OTHER RECOMMENDATIONS 12/25/2014  Recommended Consults (None)  Oral Care Recommendations Oral care Q4 per protocol  Other Recommendations Have oral suction available               CHL IP REASON FOR REFERRAL 12/25/2014  Reason for Referral Objectively evaluate swallowing function     CHL IP ORAL PHASE 12/25/2014  Oral Phase  Impaired                          Oral - Honey Teaspoon Weak lingual manipulation;Lingual pumping;Incomplete  tongue to palate contact;Reduced posterior  propulsion;Piecemeal  swallowing;Delayed oral transit;Lingual/palatal residue        Oral - Nectar Teaspoon Lingual pumping;Incomplete tongue to palate  contact;Reduced posterior propulsion;Weak lingual manipulation;Piecemeal  swallowing;Delayed oral transit;Lingual/palatal residue              Oral - Thin Teaspoon Lingual pumping;Incomplete tongue to palate  contact;Reduced posterior propulsion;Weak lingual  manipulation;Piecemeal  swallowing;Delayed oral transit;Lingual/palatal residue           Oral - Puree Lingual pumping;Incomplete tongue to palate contact;Reduced  posterior propulsion;Weak lingual manipulation;Piecemeal  swallowing;Delayed oral transit;Lingual/palatal residue                     CHL IP PHARYNGEAL PHASE 12/25/2014  Pharyngeal Phase Impaired              Pharyngeal - Honey Teaspoon Delayed swallow initiation;Premature spillage  to pyriform sinuses;Reduced laryngeal elevation;Reduced tongue base  retraction;Reduced epiglottic inversion;Reduced anterior laryngeal  mobility;Reduced airway/laryngeal closure;Reduced pharyngeal  peristalsis;Penetration/Aspiration during swallow;Pharyngeal residue -  pyriform sinuses;Pharyngeal residue - posterior pharnyx  Penetration/Aspiration details (honey teaspoon) Material enters airway,  passes BELOW cords and not ejected out despite cough attempt by patient              Pharyngeal - Nectar Teaspoon Delayed swallow initiation;Premature spillage  to pyriform sinuses;Reduced epiglottic inversion;Reduced laryngeal  elevation;Reduced tongue base retraction;Penetration/Aspiration during  swallow;Pharyngeal residue - pyriform sinuses;Pharyngeal residue -  posterior pharnyx;Reduced airway/laryngeal closure;Reduced anterior  laryngeal mobility;Reduced pharyngeal peristalsis  Penetration/Aspiration details (nectar teaspoon) Material enters airway,  passes BELOW cords and not ejected out despite cough attempt by patient                          Pharyngeal - Thin Teaspoon Reduced epiglottic inversion;Reduced laryngeal  elevation;Premature spillage to pyriform sinuses;Delayed swallow  initiation;Reduced tongue base retraction;Reduced airway/laryngeal  closure;Reduced anterior laryngeal mobility;Penetration/Aspiration during  swallow;Pharyngeal residue - pyriform sinuses  Penetration/Aspiration details (thin teaspoon) Material enters airway,  passes BELOW cords and not ejected out  despite cough attempt by patient                    Pharyngeal - Puree Delayed swallow initiation;Premature spillage to  valleculae;Reduced tongue base retraction;Reduced laryngeal  elevation;Reduced epiglottic inversion;Pharyngeal residue -  valleculae;Pharyngeal residue - pyriform sinuses;Pharyngeal residue -  posterior pharnyx;Penetration/Aspiration during swallow  Penetration/Aspiration details (puree) Material enters airway, CONTACTS  cords and not ejected out                                        Emilio Math 12/25/2014, 10:30 AM     Microbiology: Recent Results (from the past 240 hour(s))  Clostridium Difficile by PCR     Status: Abnormal   Collection Time: 01/12/15  3:01 PM  Result Value Ref Range Status   C difficile by pcr POSITIVE (A) NEGATIVE Final    Comment: CRITICAL RESULT CALLED TO, READ BACK BY AND VERIFIED WITH: T. CRITE RN 17:00 01/12/15 ([email or web address])   Culture, Urine     Status: None   Collection Time: 01/13/15  3:44 PM  Result Value Ref Range Status   Specimen Description URINE, CATHETERIZED  Final   Special Requests NONE  Final   Colony Count NO GROWTH Performed at [facility]   Final   Culture NO GROWTH Performed at [facility]   Final   Report  Status 01/14/2015 FINAL  Final  Clostridium Difficile by PCR     Status: Abnormal   Collection Time: 01/15/15  4:59 PM  Result Value Ref Range Status   C difficile by pcr POSITIVE (A) NEGATIVE Final    Comment: CRITICAL RESULT CALLED TO, READ BACK BY AND VERIFIED WITH: Brendolyn Patty RN 9:58 01/16/15 ([email or web address])      Labs: Basic Metabolic Panel:  Recent Labs Lab 01/17/15 0308 01/18/15 0330 01/18/15 2033 01/19/15 0130 01/20/15 01/20/15 0954 01/21/15 0445  NA 143 144 145 144 146* 148* 143  K 3.5 3.0* 3.4* 3.5 3.5 3.6 3.7  CL 109 106 101 111 111 104 111  CO2 28 34*  --  30 32  --  28  GLUCOSE 136* 125* 177* 175* 133* 92 136*  BUN 14 15 18 15 17 17 15   CREATININE 0.58 0.51 0.70 0.52 0.56 0.80 0.60   CALCIUM 8.9 8.8  --  9.0 8.9  --  8.7  MG 1.7 1.7  --   --   --   --  1.7  PHOS  --  3.1  --   --   --   --  3.1   Liver Function Tests:  Recent Labs Lab 01/18/15 0330 01/21/15 0445  AST 25 17  ALT 31 13  ALKPHOS 33* 28*  BILITOT 0.5 0.8  PROT 4.2* 4.4*  ALBUMIN 3.3* 3.8    Recent Labs Lab 01/18/15 0330  LIPASE 146*   CBC:  Recent Labs Lab 01/16/15 1059 01/18/15 0250 01/18/15 2033 01/20/15 0954 01/21/15 0445  WBC 13.2* 14.6*  --   --  13.2*  NEUTROABS  --   --   --   --  10.7*  HGB 12.5* 12.4* 13.6 13.3 13.2  HCT 38.1* 37.6* 40.0 39.0 40.4  MCV 94.1 92.6  --   --  94.8  PLT 171 180  --   --  190   BNP (last 3 results)  Recent Labs  12/28/14 2215 01/10/15 1630  BNP 250.1* 76.3    CBG:  Recent Labs Lab 01/21/15 2051 01/22/15 0005 01/22/15 0409 01/22/15 0757 01/22/15 1153  GLUCAP 290* 174* 97 169* 167*    Signed:  Barton Dubois  [facility] 01/22/2015, 2:50 PM

## 2015-01-22 NOTE — Progress Notes (Addendum)
Small amount of bleeding noted to HD cath site - right internal jugular. IV team notified at 1816. IV team currently at bedside, requesting for HD Nurse. HD Nurse paged at 1843. HD Nurse states, "there's no available nurse. Call IV Nurse." Conrad [place] A. Stephenie Navejas, RN.

## 2015-01-22 NOTE — Progress Notes (Signed)
Pt was transferred to 4 mw11 via bed for inpatient rehab at 1430.  Report given to [facility], Therapist, sports.

## 2015-01-22 NOTE — Progress Notes (Signed)
UR COMPLETED  

## 2015-01-22 NOTE — Progress Notes (Addendum)
Patient admitted to unit. Arrived at 1640 in stable condition. Patient accompanied by Nurse and NT via bed. No belongings present. R. Internal jugular IJ hemo cath/left chest porter CDI; no s/sx of infection. Peg site CDI; Jevity 1.2 infusing @ 65 ml/hr. Patient and family oriented to unit. Safety measures/care plan reviewed with patient and family. Jaber Dunlow A. Jahne Krukowski, RN

## 2015-01-22 NOTE — Progress Notes (Signed)
Patient performed nif x3 -22, flutter valve x10. Good patient effort.

## 2015-01-22 NOTE — Progress Notes (Signed)
I have insurance approval and will make arrangements to admit pt today. I discussed with Dr. Dyann Kief.

## 2015-01-22 NOTE — Clinical Social Work Note (Signed)
CSW Consult Acknowledged:   CSW received a consult to "aid the pt". CIR is following this pt. Per CIR note the pt will transition to them. CSW will sign off.      [place], MSW, Jet

## 2015-01-23 ENCOUNTER — Inpatient Hospital Stay (HOSPITAL_COMMUNITY): Payer: PPO

## 2015-01-23 ENCOUNTER — Inpatient Hospital Stay (HOSPITAL_COMMUNITY): Payer: PPO | Admitting: Speech Pathology

## 2015-01-23 ENCOUNTER — Inpatient Hospital Stay (HOSPITAL_COMMUNITY): Payer: PPO | Admitting: Occupational Therapy

## 2015-01-23 LAB — CBC WITH DIFFERENTIAL/PLATELET
BASOS ABS: 0 10*3/uL (ref 0.0–0.1)
BASOS PCT: 0 % (ref 0–1)
Eosinophils Absolute: 0 10*3/uL (ref 0.0–0.7)
Eosinophils Relative: 0 % (ref 0–5)
HEMATOCRIT: 33.4 % — AB (ref 39.0–52.0)
Hemoglobin: 10.9 g/dL — ABNORMAL LOW (ref 13.0–17.0)
LYMPHS PCT: 16 % (ref 12–46)
Lymphs Abs: 1.6 10*3/uL (ref 0.7–4.0)
MCH: 30.5 pg (ref 26.0–34.0)
MCHC: 32.6 g/dL (ref 30.0–36.0)
MCV: 93.6 fL (ref 78.0–100.0)
MONO ABS: 0.7 10*3/uL (ref 0.1–1.0)
Monocytes Relative: 8 % (ref 3–12)
Neutro Abs: 7.3 10*3/uL (ref 1.7–7.7)
Neutrophils Relative %: 76 % (ref 43–77)
Platelets: 171 10*3/uL (ref 150–400)
RBC: 3.57 MIL/uL — ABNORMAL LOW (ref 4.22–5.81)
RDW: 17.2 % — AB (ref 11.5–15.5)
WBC: 9.7 10*3/uL (ref 4.0–10.5)

## 2015-01-23 LAB — COMPREHENSIVE METABOLIC PANEL
ALT: 15 U/L (ref 0–53)
AST: 17 U/L (ref 0–37)
Albumin: 2.9 g/dL — ABNORMAL LOW (ref 3.5–5.2)
Alkaline Phosphatase: 38 U/L — ABNORMAL LOW (ref 39–117)
Anion gap: 6 (ref 5–15)
BUN: 16 mg/dL (ref 6–23)
CALCIUM: 8.4 mg/dL (ref 8.4–10.5)
CO2: 32 mmol/L (ref 19–32)
CREATININE: 0.6 mg/dL (ref 0.50–1.35)
Chloride: 104 mmol/L (ref 96–112)
GLUCOSE: 156 mg/dL — AB (ref 70–99)
Potassium: 3.5 mmol/L (ref 3.5–5.1)
Sodium: 142 mmol/L (ref 135–145)
Total Bilirubin: 0.6 mg/dL (ref 0.3–1.2)
Total Protein: 4.1 g/dL — ABNORMAL LOW (ref 6.0–8.3)

## 2015-01-23 LAB — GLUCOSE, CAPILLARY
GLUCOSE-CAPILLARY: 178 mg/dL — AB (ref 70–99)
GLUCOSE-CAPILLARY: 143 mg/dL — AB (ref 70–99)
GLUCOSE-CAPILLARY: 224 mg/dL — AB (ref 70–99)
GLUCOSE-CAPILLARY: 196 mg/dL — AB (ref 70–99)
Glucose-Capillary: 120 mg/dL — ABNORMAL HIGH (ref 70–99)
Glucose-Capillary: 148 mg/dL — ABNORMAL HIGH (ref 70–99)
Glucose-Capillary: 97 mg/dL (ref 70–99)

## 2015-01-23 MED ORDER — FREE WATER
100.0000 mL | Freq: Three times a day (TID) | Status: DC
  Administered 2015-01-23 – 2015-01-25 (×7): 100 mL

## 2015-01-23 MED ORDER — CALCIUM POLYCARBOPHIL 625 MG PO TABS
625.0000 mg | ORAL_TABLET | Freq: Every day | ORAL | Status: DC
Start: 2015-01-23 — End: 2015-02-03
  Administered 2015-01-23 – 2015-02-03 (×12): 625 mg via ORAL
  Filled 2015-01-23 (×13): qty 1

## 2015-01-23 MED ORDER — SACCHAROMYCES BOULARDII 250 MG PO CAPS
250.0000 mg | ORAL_CAPSULE | Freq: Two times a day (BID) | ORAL | Status: DC
  Administered 2015-01-23 – 2015-02-03 (×23): 250 mg via ORAL
  Filled 2015-01-23 (×28): qty 1

## 2015-01-23 MED ORDER — INSULIN GLARGINE 100 UNIT/ML ~~LOC~~ SOLN
15.0000 [IU] | Freq: Every day | SUBCUTANEOUS | Status: AC
  Administered 2015-01-24 – 2015-01-26 (×3): 15 [IU] via SUBCUTANEOUS
  Filled 2015-01-23 (×3): qty 0.15

## 2015-01-23 NOTE — Evaluation (Signed)
Occupational Therapy Assessment and Plan  Patient Details  Name: Craig [name] MRN: [id number] Date of Birth: [birth year]  OT Diagnosis: abnormal posture and muscle weakness (generalized) Rehab Potential: Rehab Potential (ACUTE ONLY): Excellent ELOS: 10-12 days   Today's Date: 01/23/2015 OT Individual Time: 0900-1000 OT Individual Time Calculation (min): 60 min     Problem List:  Patient Active Problem List   Diagnosis Date Noted  . Physical deconditioning   . C. difficile colitis   . Obstructive sleep apnea   . Benign essential hypertension antepartum   . HLD (hyperlipidemia)   . Hypophosphatemia   . Hypokalemia   . 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 Diabetes type 2, uncontrolled   . Respiratory abnormality   . Collapse   . Acute respiratory failure   . Myasthenia 01/10/2015  . Myasthenia gravis 01/08/2015  . 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 Myasthenia gravis with acute exacerbation 01/08/2015  . MG with exacerbation (myasthenia gravis) 12/29/2014  . Protein-calorie malnutrition, severe   . Esophageal reflux   . Chest pain 12/27/2014  . Chronic atrial fibrillation 12/27/2014  . Pain in the chest   . Diaphoresis   . Bradycardia   . Dysphagia   . MG, crisis (myasthenia gravis) 12/17/2014  . Dysphagia, pharyngoesophageal phase   . Enteritis due to Clostridium difficile   . Clostridium difficile diarrhea 12/15/2014  . Hypernatremia   . Acute exacerbation of myasthenia gravis   . Myasthenia gravis in crisis 12/05/2014  . Bladder cancer 12/05/2014  . HTN (hypertension) 12/05/2014  . A-fib 12/05/2014  . Chronic anticoagulation 12/05/2014  . Acute respiratory failure with hypoxia 12/05/2014    Past Medical History:  Past Medical History  Diagnosis Date  . Atrial fibrillation, chronic     on Xarelto   . HTN (hypertension)   . CHF (congestive heart failure)   . Diabetes mellitus   . 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 Myasthenia gravis    Past Surgical History:  Past Surgical History  Procedure Laterality Date  . Abdominal wall metastasis removal      Assessment & Plan Clinical Impression: Patient is a 74 y.o. year old male with recent admission to the hospital with history of AFib, bladder cancer, DM type 2, MG with recent flare who was recently discharged to home past CIR on 01/02/15. He was seen by outpatient neurologist on 02/26 with plans to start home IVIG but was readmitted on 01/10/15 with fatigue, worsening of SOB and hypoxia. HD Cath placed by CCM on 02/28 and neurology recommended IVIG X 5 treatments. CCM consulted for close monitoring of respiratory status due to concerns of requiring intubation and he was started on IV zosyn for PNA. He developed diarrhea with recurrent c diff and was started on metronidazole on 03/05. He has had neck discomfort due to HD cath requiring IV fentanyl for pain management. ST evaluation done yesterday and patient started on trials of ice chips with plans for objective swallow evaluation. He has completed his treatment course of IVIG today and is reporting increase in strength as well as swallow function. MBS done 03/08 and patient started on regular diet with thin liquids. Therapy ongoing and patient showing improvement in activity tolerance. CIR recommended by MD and Rehab team. Patient transferred to CIR on 01/22/2015 .   Patient currently requires mod with basic self-care skills secondary to muscle weakness and decreased cardiorespiratoy endurance  and decreased oxygen support.  Prior to hospitalization, patient could complete ADLs with min.  Patient will benefit from skilled intervention to decrease level of assist with basic self-care skills, increase independence with basic self-care skills and increase level of independence with iADL prior to discharge home with care partner.  Anticipate patient will  require 24 hour supervision and follow up home health.  OT - End of Session Activity Tolerance: Tolerates < 10 min activity with changes in vital signs Endurance Deficit: Yes Endurance Deficit Description: c/o SOB with activity; 2L via [place] multiple rest breaks during session due to fatigue OT Assessment Rehab Potential (ACUTE ONLY): Excellent OT Patient demonstrates impairments in the following area(s): Balance;Endurance;Motor;Pain;Safety OT Basic ADL's Functional Problem(s): Bathing;Dressing;Toileting OT Transfers Functional Problem(s): Toilet OT Additional Impairment(s): Fuctional Use of Upper Extremity OT Plan OT Intensity: Minimum of 1-2 x/day, 45 to 90 minutes OT Frequency: 5 out of 7 days OT Duration/Estimated Length of Stay: 10-12 days OT Treatment/Interventions: Medical illustrator training;Community reintegration;Discharge planning;DME/adaptive equipment instruction;Disease mangement/prevention;Patient/family education;Self Care/advanced ADL retraining;Therapeutic Exercise;UE/LE Coordination activities;UE/LE Strength taining/ROM;Visual/perceptual remediation/compensation;Therapeutic Activities;Skin care/wound managment;Functional mobility training;Psychosocial support OT Self Feeding Anticipated Outcome(s): Supervision OT Basic Self-Care Anticipated Outcome(s): Supervision OT Toileting Anticipated Outcome(s): Supervision OT Bathroom Transfers Anticipated Outcome(s): Supervision OT Recommendation Patient destination: Home Follow Up Recommendations: Home health OT Equipment Recommended: Tub/shower bench   Skilled Therapeutic Intervention Pt seen for OT eval and ADL bathing and dressing session. Pt in supine upon arrival. He transferred supine>EOB with HOB flat and mod A at trunk to come into upright sitting. Pt able to sit unsupported EOB to complete bathing task. Pt with decreased functional endurance, requiring rest breaks throughout seated bathing session. Pt on 2L O2 via [place]. Pt's  O2 monitored throughout session, never going below 93%. Pt required supervision for UB bathing and max A for LB bathing to reach below knees and total A for pericare/buttock hygiene requiring mod A to stand and assist to clean. Pt donned hospital gown with set-up.    Pt transferred EOB>w/c with mod A via squat-pivot transfer. Pt brought into bathroom via w/c and transferred w/c <>standard toilet with mod A. He required total A for hygiene and clothing management. Pt returned to supine with mod A at end of session for thorough buttock hygiene. Pt left in supine with NT present.   OT Evaluation Precautions/Restrictions  Precautions Precautions: Fall Precaution Comments: Monitor O2 Restrictions Weight Bearing Restrictions: No General Chart Reviewed: Yes Additional Pertinent History: Readmit following flare up of MG Vital Signs Oxygen Therapy SpO2: 92 % O2 Device: Not Delivered Pain Pain Assessment Pain Assessment: 0-10 Pain Score: 0- denies pain  Home Living/Prior Functioning Home Living Living Arrangements: Spouse/significant other Available Help at Discharge: Family, Available 24 hours/day Type of Home: House Home Access: Stairs to enter Technical brewer of Steps: 3 Entrance Stairs-Rails: Right Home Layout: One level, Laundry or work area in basement Additional Comments: wife reports that if he goes home, they will probably need tub bench  Lives With: Spouse Prior Function Level of Independence: Requires assistive device for independence  Able to Take Stairs?: Yes Comments: Pt recently discharged from CIR using [place] at S level.  Vision/Perception  Vision- History Baseline Vision/History: Wears glasses Wears Glasses: Reading only Patient Visual Report: No change from baseline Vision- Assessment Vision Assessment?: No apparent visual deficits  Cognition Overall Cognitive Status: Within Functional Limits for tasks assessed Arousal/Alertness: Awake/alert Orientation  Level: Oriented X4 Attention: Focused Focused Attention: Appears intact Memory: Appears intact Awareness: Appears intact Problem Solving:  Appears intact Safety/Judgment: Appears intact Comments: Pt with flat affect and discouraged with readmission. Sensation Sensation Light Touch: Appears Intact Proprioception: Appears Intact Coordination Gross Motor Movements are Fluid and Coordinated: Yes Motor  Motor Motor: Abnormal postural alignment and control Motor - Skilled Clinical Observations: generalized weakness proximal > distal Mobility  Bed Mobility Bed Mobility: Supine to Sit Supine to Sit: 3: Mod assist Supine to Sit Details: Manual facilitation for weight shifting Supine to Sit Details (indicate cue type and reason): With HOB flat pt able to bring LEs off EOB, assist needed to bring trunk into upright sitting posture.  Transfers Transfers: Sit to Stand;Stand to Sit Sit to Stand: 3: Mod assist Sit to Stand Details: Verbal cues for technique;Manual facilitation for weight bearing Sit to Stand Details (indicate cue type and reason): Pt unable to  come into full upright standing due to weakness Stand to Sit: 3: Mod assist Stand to Sit Details (indicate cue type and reason): Manual facilitation for weight shifting Stand to Sit Details: Assist for controlle descent  Trunk/Postural Assessment  Cervical Assessment Cervical Assessment: Exceptions to [facility] Cervical AROM Overall Cervical AROM Comments: Decreased functional movement for turning to side Cervical Strength Overall Cervical Strength Comments: maintains forwards head Thoracic Assessment Thoracic Assessment: Exceptions to [facility] Thoracic Strength Overall Thoracic Strength Comments: flexed posture Lumbar Assessment Lumbar Assessment: Exceptions to [facility] Postural Control Postural Control: Deficits on evaluation Head Control: Pt with forward flexed position, requiring cues for upright position however demonstrates decreased  ability to maintain upright posture Postural Limitations: flexed posture in standing with difficulty achieving upright posture.  Balance Balance Balance Assessed: Yes Static Sitting Balance Static Sitting - Level of Assistance: 5: Stand by assistance Dynamic Sitting Balance Dynamic Sitting - Balance Support: Feet supported;During functional activity Dynamic Sitting - Level of Assistance: 5: Stand by assistance Dynamic Sitting - Balance Activities: Lateral lean/weight shifting;Forward lean/weight shifting;Reaching across midline Sitting balance - Comments: pt able to wt shift in sitting without UE support and no LOB during functioanl bathing task Static Standing Balance Static Standing - Balance Support: During functional activity Static Standing - Level of Assistance: 2: Max assist Static Standing - Comment/# of Minutes: Pt unable to maintain full upright standing Dynamic Standing Balance Dynamic Standing - Level of Assistance: 3: Mod assist Extremity/Trunk Assessment RUE Assessment RUE Assessment: Exceptions to [facility] RUE AROM (degrees) RUE Overall AROM Comments: Pt able to reach ~90 degrees shoulder flexion RUE Strength RUE Overall Strength Comments: grossly 3+/5 LUE Assessment LUE Assessment: Exceptions to WFL LUE AROM (degrees) LUE Overall AROM Comments: ~80 degrees shoulder flexion LUE Strength LUE Overall Strength Comments: grossly 4-/5 FIM:  FIM - Grooming Grooming Steps: Wash, rinse, dry face Grooming: 5: Supervision: safety issues or verbal cues FIM - Bathing Bathing Steps Patient Completed: Chest;Right Arm;Left Arm;Abdomen;Right upper leg;Left upper leg Bathing: 3: Mod-Patient completes 5-7 [id number] parts or 50-74% FIM - Upper Body Dressing/Undressing Upper body dressing/undressing: 0: Wears gown/pajamas-no public clothing FIM - Lower Body Dressing/Undressing Lower body dressing/undressing: 2: Max-Patient completed 25-49% of tasks FIM - TMusician Devices: Grab bar or rail for support Toileting: 1: Two helpers FIM - BControl and instrumentation engineerDevices: Bed rails Bed/Chair Transfer: 3: Supine > Sit: Mod A (lifting assist/Pt. 50-74%/lift 2 legs;3: Bed > Chair or W/C: Mod A (lift or lower assist);3: Chair or W/C > Bed: Mod A (lift or lower assist) FIM - Toilet Transfers Toilet Transfers: 3-To toilet/BSC: Mod A (lift or lower assist);3-From toilet/BSC: Mod A (lift or  lower assist)   Refer to Care Plan for Long Term Goals  Recommendations for other services: None  Discharge Criteria: Patient will be discharged from OT if patient refuses treatment 3 consecutive times without medical reason, if treatment goals not met, if there is a change in medical status, if patient makes no progress towards goals or if patient is discharged from hospital.  The above assessment, treatment plan, treatment alternatives and goals were discussed and mutually agreed upon: by patient  Ernestina Patches 01/23/2015, 12:54 PM

## 2015-01-23 NOTE — Plan of Care (Signed)
Problem: SCI BLADDER ELIMINATION Goal: RH STG MANAGE BLADDER WITH ASSISTANCE STG Manage Bladder With Assistance  Outcome: Not Progressing Condom catheter in place

## 2015-01-23 NOTE — Progress Notes (Signed)
Patient information reviewed and entered into eRehab system by [employer], RN, CRRN, PPS Coordinator.  Information including medical coding and functional independence measure will be reviewed and updated through discharge.     Per nursing patient was given "Data Collection Information Summary for Patients in Inpatient Rehabilitation Facilities with attached "Privacy Act Statement-Health Care Records" upon admission.  

## 2015-01-23 NOTE — Plan of Care (Signed)
Problem: RH SKIN INTEGRITY Goal: RH STG MAINTAIN SKIN INTEGRITY WITH ASSISTANCE STG Maintain Skin Integrity With min Assistance.  Outcome: Progressing Staff apply powder/cream to buttocks, scrotum, groin area rash

## 2015-01-23 NOTE — Progress Notes (Signed)
PMR Admission Coordinator Pre-Admission Assessment  Patient: Craig [name] is an 74 y.o., male MRN: [id number] DOB: 11/03/1941 Height: 5\' 7"  (170.2 cm) Weight: 107.6 kg (237 lb 3.4 oz)  Insurance Information HMO: PPO: yes PCP: IPA: 80/20: OTHER: medicare advantage plan PRIMARY: [employer] Policy#: [id number] Subscriber: pt CM Name: Enrique Sack Phone#: [phone] Fax#: [phone] Pre-Cert#: [id number] Employer: retired Benefits: Phone #: [phone] Name: 01/22/15 Eff. Date: 11/14/14 Deduct: none Out of Pocket Max: $3100 Life Max: none CIR: $225 copay per admit then $75 per day dayd 2 through 5 SNF: no copay days 1-20; $140 copay per day days 21-100 Outpatient: $10 per visit copay Co-Pay: no visit limit Home Health: $10 copay per visit Co-Pay: no visi tlimit DME: 80% Co-Pay: 20% Providers: in network  SECONDARY: none  Medicaid Application Date: Case Manager:  Disability Application Date: Case Worker:   Emergency Facilities manager Information    Name Relation Home Work Mobile   Beddow,Martha Spouse [phone]     Pacey, Altizer   [phone]     Current Medical History  Patient Admitting Diagnosis: myasthenia gravis with crisis  History of Present Illness:Dayon Ringer is a 74 y.o. male with history of AFib, bladder cancer, DM type 2, MG with recent flare who was recently discharged to home past CIR on 01/02/15. He was seen by outpatient neurologist on 02/26 with plans to start home IVIG but was readmitted on 01/10/15 with fatigue, worsening of SOB and hypoxia.   HD Cath placed by CCM on 02/28 and neurology recommended IVIG X 5 treatments. CCM consulted for close monitoring of respiratory status due to  concerns of requiring intubation and he was started on IV zosyn for PNA. He developed diarrhea with recurrent c diff and was started on metronidazole on 03/05. He has had neck discomfort due to HD cath requiring IV fentanyl for pain management. He has completed his treatment course of IVIG today and is reporting increase in strength as well as swallow function. MBS done today and patient started on regular diet with thin liquids.  Plasmapheresis completed 5/5 on 01/21/15. Neurologist to follow as outpatient at this time. Recommend rehabilitation.   Past Medical History  Past Medical History  Diagnosis Date  . Atrial fibrillation, chronic     on Xarelto   . HTN (hypertension)   . CHF (congestive heart failure)   . Diabetes mellitus   . 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 Myasthenia gravis     Family History  family history includes Colon cancer in his mother; Diabetes in his father; Heart attack in his father.  Prior Rehab/Hospitalizations: CIR 12/29/14 through 01/02/15. F/u with Dr. Krista Blue at outptatient neurologist who was attempting insurance approval for home IVIG but pt function declined and pt readmitted to acute hospital  Current Medications   Current facility-administered medications:  . 0.9 % sodium chloride infusion, , Intravenous, Continuous, Raylene Miyamoto, MD, Last Rate: 10 mL/hr at 01/20/15 2223 . albuterol (PROVENTIL) (2.5 MG/3ML) 0.083% nebulizer solution 2.5 mg, 2.5 mg, Nebulization, Q2H PRN, Cherene Altes, MD, 2.5 mg at 01/21/15 2309 . amiodarone (PACERONE) tablet 100 mg, 100 mg, Per Tube, Daily, Donita Brooks, NP, 100 mg at 01/22/15 1041 . antiseptic oral rinse (CPC / CETYLPYRIDINIUM CHLORIDE 0.05%) solution 7 mL, 7 mL, Mouth Rinse, q12n4p, Amie Portland, MD, 7 mL at 01/21/15 1600 . chlorhexidine (PERIDEX) 0.12 % solution 15 mL, 15 mL, Mouth Rinse, BID, Osvaldo A Camilo,  MD, 15 mL at 01/22/15 0848 . famotidine (PEPCID) 40 MG/5ML suspension 40 mg, 40 mg, Per Tube, Daily, Cherene Altes, MD, 40 mg at 01/22/15 1042 . feeding supplement (JEVITY 1.2 CAL) liquid 1,000 mL, 1,000 mL, Per Tube, Continuous, Ardeen Garland, RD, Last Rate: 65 mL/hr at 01/21/15 0635, 1,000 mL at 01/21/15 0635 . feeding supplement (PRO-STAT SUGAR FREE 64) liquid 30 mL, 30 mL, Per Tube, Daily, Ardeen Garland, RD, 30 mL at 01/22/15 1041 . fentaNYL (SUBLIMAZE) injection 25-50 mcg, 25-50 mcg, Intravenous, Q2H PRN, Colbert Coyer, MD, 50 mcg at 01/21/15 2151 . insulin aspart (novoLOG) injection 0-15 Units, 0-15 Units, Subcutaneous, 6 times per day, Donita Brooks, NP, 3 Units at 01/22/15 0847 . insulin aspart (novoLOG) injection 4 Units, 4 Units, Subcutaneous, 6 times per day, Donita Brooks, NP, 4 Units at 01/22/15 0846 . insulin glargine (LANTUS) injection 24 Units, 24 Units, Subcutaneous, Daily, Cherene Altes, MD, 24 Units at 01/22/15 1042 . metoprolol (LOPRESSOR) injection 2.5-5 mg, 2.5-5 mg, Intravenous, Q3H PRN, Raylene Miyamoto, MD . metroNIDAZOLE (FLAGYL) tablet 500 mg, 500 mg, Per Tube, 3 times per day, Cherene Altes, MD, 500 mg at 01/22/15 0558 . mupirocin ointment (BACTROBAN) 2 %, , Topical, Daily, Raylene Miyamoto, MD . predniSONE (DELTASONE) tablet 60 mg, 60 mg, Per Tube, Q breakfast, Cherene Altes, MD, 60 mg at 01/22/15 0849 . pyridostigmine (MESTINON) tablet 60 mg, 60 mg, Oral, 4 times per day, Amie Portland, MD, 60 mg at 01/22/15 0558 . rivaroxaban (XARELTO) tablet 20 mg, 20 mg, Per Tube, Q supper, Olam Idler, MD, 20 mg at 01/21/15 1914 . sodium chloride 0.9 % injection 10-40 mL, 10-40 mL, Intracatheter, PRN, Amie Portland, MD, 20 mL at 01/11/15 1101  Patients Current Diet: Diet regular with thin liquids. FEEs 01/20/15 whole meds with puree.  Precautions / Restrictions Precautions Precautions: Fall Precaution Comments: [place]  HR and O2 Restrictions Weight Bearing Restrictions: No Other Position/Activity Restrictions: HOB elevated due to continuous tube feeds.   Prior Activity Level Recent d/c 01/02/15 from CIR. Was Mod I to supervision with wife assisting. Home PEG feeds. NPO at home. New to home 02 since d/c. Also yonker suctioning at home.  Home Assistive Devices / Equipment Home Assistive Devices/Equipment: Environmental consultant (specify type), Shower chair with back Home Equipment: Walker - 2 wheels, Cane - single point, Bedside commode, Shower seat  Prior Functional Level Prior Function Level of Independence: Needs assistance Gait / Transfers Assistance Needed: RW for gait ADL's / Homemaking Assistance Needed: needs assist for showering Comments: Information since discharge home.  Current Functional Level Cognition  Overall Cognitive Status: Impaired/Different from baseline Orientation Level: Oriented X4 Safety/Judgement: Decreased awareness of safety General Comments: lethargic today   Extremity Assessment (includes Sensation/Coordination)  Upper Extremity Assessment: RUE deficits/detail, LUE deficits/detail RUE Deficits / Details: 2+/5 shoulder to 80 degrees, full PROM, 4+/5 elbow to hand RUE Coordination: decreased gross motor LUE Deficits / Details: 3-/5 shoulder, 4+/5 elbow to hand LUE Coordination: decreased gross motor  Lower Extremity Assessment: Defer to PT evaluation    ADLs  Overall ADL's : Needs assistance/impaired Eating/Feeding: Set up, Bed level Eating/Feeding Details (indicate cue type and reason): oral feeds and peg Grooming: Minimal assistance, Sitting Upper Body Bathing: Minimal assitance, Sitting Lower Body Bathing: Moderate assistance, Sit to/from stand Lower Body Bathing Details (indicate cue type and reason): difficulty with releasing RW and maintinaing balance with peri care Upper Body Dressing : Minimal assistance, Sitting Lower Body  Dressing: Moderate assistance, Sit  to/from stand Toilet Transfer: Minimal assistance, RW Toileting- Clothing Manipulation and Hygiene: Moderate assistance, Sit to/from stand General ADL Comments: Doffed sock, assisted to start over toes to donn. Reliant on at least one hand on walker for balance.    Mobility  Overal bed mobility: Needs Assistance Bed Mobility: Supine to Sit Supine to sit: Supervision, HOB elevated Sit to supine: Supervision General bed mobility comments: use of rail, able to get to EOB without physical assist    Transfers  Overall transfer level: Needs assistance Equipment used: Rolling walker (2 wheeled) Transfers: Sit to/from Stand, Stand Pivot Transfers Sit to Stand: Min assist, +2 physical assistance Stand pivot transfers: Min assist, +2 safety/equipment General transfer comment: due to fatigue, min A to rise and pivot with RW    Ambulation / Gait / Stairs / Wheelchair Mobility  Ambulation/Gait Ambulation/Gait assistance: Museum/gallery curator (Feet): 18 Feet Assistive device: Rolling walker (2 wheeled) Gait Pattern/deviations: Step-to pattern, Decreased stride length, Drifts right/left, Trunk flexed Gait velocity: decreased Gait velocity interpretation: Below normal speed for age/gender General Gait Details: too fatigued today    Posture / Balance Dynamic Sitting Balance Sitting balance - Comments: pt able to wt shift in sitting without UE support and no LOB Balance Overall balance assessment: Needs assistance Sitting-balance support: No upper extremity supported, Feet supported Sitting balance-Leahy Scale: Good Sitting balance - Comments: pt able to wt shift in sitting without UE support and no LOB Standing balance support: Single extremity supported, During functional activity Standing balance-Leahy Scale: Poor Standing balance comment: able to stand with single UE support     Special needs/care consideration BiPAP/CPAP yes Oxygen 2 liters  [place]   Bowel mgmt: rectal tube with cdiff positive Bladder mgmt: foley Diabetic mgmtyes [facility], [place], to have [facility] follow up at d/c. [place] via [place] per Dr. Reynaldo Minium   Previous Home Environment Living Arrangements: Spouse/significant other Lives With: Spouse Available Help at Discharge: Family, Available 24 hours/day Type of Home: House Home Layout: One level, Laundry or work area in basement Home Access: Stairs to enter Entrance Stairs-Rails: Right Technical brewer of Steps: 3 Bathroom Shower/Tub: Gaffer, Chiropodist: Standard Bathroom Accessibility: Yes How Accessible: Accessible via walker [place]: No Type of Home Care Services: Other (Comment) (DMe via [place] [facility], [facility] for Home he) [place] (if known): [facility] for DME; [facility]  Discharge Living Setting Plans for Discharge Living Setting: Patient's home, Lives with (comment), Other (Comment) (wife) Type of Home at Discharge: House Discharge Home Layout: One level Discharge Home Access: Stairs to enter Entrance Stairs-Rails: Right Entrance Stairs-Number of Steps: 3 Discharge Bathroom Shower/Tub: Tub/shower unit Discharge Bathroom Toilet: Standard Discharge Bathroom Accessibility: Yes How Accessible: Accessible via walker Does the patient have any problems obtaining your medications?: Yes (Describe)  Social/Family/Support Systems Patient Roles: Spouse, Parent Contact Information: Luster Landsberg, wife Anticipated Caregiver: wife Anticipated Caregiver's Contact Information: see above Ability/Limitations of Caregiver: no limitations Caregiver Availability: 24/7 Discharge Plan Discussed with Primary Caregiver: Yes Is Caregiver In Agreement with Plan?: Yes Does Caregiver/Family have Issues with Lodging/Transportation while Pt is in Rehab?: No  Goals/Additional Needs Patient/Family Goal for Rehab: Mod I to  supervision with PT and OT, supervision to min assist SLP Expected length of stay: ELOS 7 days Dietary Needs: Regular diet with thin liquids rec by SLP 3/8 after FEES. Still has PEG Equipment Needs: New Peg last admission Pt/Family Agrees to Admission and  willing to participate: Yes Program Orientation Provided & Reviewed with Pt/Caregiver Including Roles & Responsibilities: Yes  Decrease burden of Care through IP rehab admission: n/a  Possible need for SNF placement upon discharge:not anticipated. Wife states she will take home before she will consider SNF placement.  Patient Condition: This patient's medical and functional status has changed since the consult dated: 01/20/2015 in which the Rehabilitation Physician determined and documented that the patient's condition is appropriate for intensive rehabilitative care in an inpatient rehabilitation facility. See "History of Present Illness" (above) for medical update. Functional changes are: mod assist. Patient's medical and functional status update has been discussed with the Rehabilitation physician and patient remains appropriate for inpatient rehabilitation. Will admit to inpatient rehab today.  Preadmission Screen Completed By: Cleatrice Burke, 01/22/2015 11:02 AM ______________________________________________________________________  Discussed status with Dr. Naaman Plummer on 01/22/15 at 4 and received telephone approval for admission today.  Admission Coordinator: Cleatrice Burke, time 9030 Date 01/22/2015          Cosigned by: Meredith Staggers, MD at 01/22/2015 11:28 AM  Revision History     Date/Time User Provider Type Action   01/22/2015 11:28 AM Meredith Staggers, MD Physician Cosign   01/22/2015 11:04 AM Cristina Gong, RN Rehab Admission Coordinator Sign   01/22/2015 11:02 AM Cristina Gong, RN Rehab Admission Coordinator Sign   View Details Report

## 2015-01-23 NOTE — Progress Notes (Signed)
Physical Medicine and Rehabilitation Consult   Reason for Consult: SOB with fatigue due to MG exacerbation Referring Physician: Dr. Sherral Hammers.    HPI: Craig [name] is a 74 y.o. male with history of AFib, bladder cancer, DM type 2, MG with recent flare who was recently discharged to home past CIR on 01/02/15. He was seen by outpatient neurologist on 02/26 with plans to start home IVIG but was readmitted on 01/10/15 with fatigue, worsening of SOB and hypoxia. HD Cath placed by CCM on 02/28 and neurology recommended IVIG X 5 treatments. CCM consulted for close monitoring of respiratory status due to concerns of requiring intubation and he was started on IV zosyn for PNA. He developed diarrhea with recurrent c diff and was started on metronidazole on 03/05. He has had neck discomfort due to HD cath requiring IV fentanyl for pain management. ST evaluation done yesterday and patient started on trials of ice chips with plans for objective swallow evaluation. He has completed his treatment course of IVIG today and is reporting increase in strength as well as swallow function. MBS done today and patient started on regular diet with thin liquids. Therapy ongoing and patient showing improvement in activity tolerance. CIR recommended by MD and Rehab team.    Review of Systems  Eyes: Positive for blurred vision.  Respiratory: Positive for cough.  Neurological: Positive for focal weakness.      Past Medical History  Diagnosis Date  . Atrial fibrillation, chronic     on Xarelto   . HTN (hypertension)   . CHF (congestive heart failure)   . Diabetes mellitus   . 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 Myasthenia gravis     Past Surgical History  Procedure Laterality Date  . Abdominal wall metastasis removal      Family History  Problem Relation Age of Onset  . Colon cancer Mother   . Heart  attack Father   . Diabetes Father     Social History:  reports that he has never smoked. He does not have any smokeless tobacco history on file. He reports that he does not drink alcohol or use illicit drugs.    Allergies  Allergen Reactions  . Ibuprofen Other (See Comments)    Interacts with another medication patient is taking    Medications Prior to Admission  Medication Sig Dispense Refill  . amiodarone (PACERONE) 100 MG tablet Place 1 tablet (100 mg total) into feeding tube daily.    Marland Kitchen antiseptic oral rinse (CPC / CETYLPYRIDINIUM CHLORIDE 0.05%) 0.05 % LIQD solution 7 mLs by Mouth Rinse route 2 (two) times daily. 946 mL 1  . artificial tears (LACRILUBE) OINT ophthalmic ointment Place into both eyes every 4 (four) hours as needed for dry eyes.  0  . atorvastatin (LIPITOR) 40 MG tablet Place 1 tablet (40 mg total) into feeding tube daily.    . cephALEXin (KEFLEX) 250 MG/5ML suspension Place 5 mLs (250 mg total) into feeding tube every 8 (eight) hours. 35 mL 0  . famotidine (PEPCID) 40 MG/5ML suspension Take 5 mLs (40 mg total) by mouth daily. 150 mL 0  . insulin aspart (NOVOLOG FLEXPEN) 100 UNIT/ML FlexPen Inject 0-10 Units into the skin 4 (four) times daily. Use according to scale prior to tube feeds. 15 mL 1  . ipratropium (ATROVENT HFA) 17 MCG/ACT inhaler Inhale 1 puff into the lungs every 6 (six) hours as needed for wheezing.    Marland Kitchen  ipratropium (ATROVENT) 0.02 % nebulizer solution Inhale 2.5 mLs (0.5 mg total) into the lungs every 6 (six) hours as needed (wHEEZING.). 75 mL 12  . metoprolol tartrate (LOPRESSOR) 25 MG tablet Place 0.5 tablets (12.5 mg total) into feeding tube daily.    . nateglinide (STARLIX) 120 MG tablet Place 1 tablet (120 mg total) into feeding tube 3 (three) times daily. 90 tablet 1  . Nutritional Supplements (FEEDING SUPPLEMENT, JEVITY 1.2 CAL,) LIQD Place 390 mLs into feeding tube 4  (four) times daily - with meals and at bedtime.  0  . potassium chloride 20 MEQ/15ML (10%) SOLN Place 15 mLs (20 mEq total) into feeding tube 3 (three) times daily. 3800 mL 0  . predniSONE (DELTASONE) 20 MG tablet Place 3 tablets (60 mg total) into feeding tube daily with breakfast. 150 tablet 1  . pyridostigmine (MESTINON) 60 MG tablet 1.5 tablets (90 mg total) by Per NG tube route 4 (four) times daily. 135 tablet 1  . rivaroxaban (XARELTO) 20 MG TABS tablet Take 1 tablet (20 mg total) by mouth daily with supper. (Patient taking differently: Place 20 mg into feeding tube daily with supper. )    . chlorhexidine (PERIDEX) 0.12 % solution 15 mLs by Mouth Rinse route 2 (two) times daily. 120 mL 0  . ondansetron (ZOFRAN) 4 MG tablet Take 1 tablet (4 mg total) by mouth every 6 (six) hours as needed for nausea. 20 tablet 0  . senna (SENOKOT) 8.6 MG TABS tablet Place 1 tablet (8.6 mg total) into feeding tube daily as needed for mild constipation. (Patient not taking: Reported on 01/10/2015) 120 each 0  . Water For Irrigation, Sterile (FREE WATER) SOLN Place 150 mLs into feeding tube 4 (four) times daily - after meals and at bedtime.      Home: Home Living Family/patient expects to be discharged to:: Unsure Living Arrangements: Spouse/significant other Available Help at Discharge: Family, Available 24 hours/day Type of Home: House Home Access: Stairs to enter [employer] of Steps: 3 Entrance Stairs-Rails: Right Home Layout: One level, Laundry or work area in [place]: Environmental consultant - 2 wheels, Radio producer - single point, Bedside commode, Shower seat Lives With: Spouse  Functional History: Prior Function Level of Independence: Needs assistance Gait / Transfers Assistance Needed: RW for gait ADL's / Homemaking Assistance Needed: needs assist to bath. Comments: Infor represents time since returning home from previous CIR stay. Functional  Status:  Mobility: Bed Mobility Overal bed mobility: Needs Assistance Bed Mobility: Supine to Sit, Sit to Supine Supine to sit: Min guard, HOB elevated Sit to supine: Min guard General bed mobility comments: No physical assist required, patient able to come to EOB quickly, min guard for safety wikth lines Transfers Overall transfer level: Needs assistance Equipment used: Rolling walker (2 wheeled), 2 person hand held assist Transfers: Sit to/from Stand, Stand Pivot Transfers Sit to Stand: Min assist, +2 physical assistance Stand pivot transfers: Min guard General transfer comment: min assist to elevate to standing, performed x4 during session with extended static standing trials. Vcs for hand placement and safety       ADL:    Cognition: Cognition Overall Cognitive Status: Impaired/Different from baseline Orientation Level: Oriented X4 Cognition Arousal/Alertness: Awake/alert Behavior During Therapy: Flat affect Overall Cognitive Status: Impaired/Different from baseline Area of Impairment: Safety/judgement, Problem solving Safety/Judgement: Decreased awareness of safety Problem Solving: Slow processing, Requires verbal cues, Requires tactile cues  Blood pressure 111/70, pulse 72, temperature 96.6 F (35.9 C), temperature source Oral, resp. rate  21, height 5\' 7"  (1.702 m), weight 106.3 kg (234 lb 5.6 oz), SpO2 94 %. Physical Exam  Constitutional: He appears well-developed and well-nourished.  HENT:  Head: Normocephalic.  Skin breakdown over nose  Cardiovascular: Normal rate.  Respiratory: Effort normal.  Neurological:  Speech clearer. Has better oral-motor control. Mild ptosis and dysarthria still. UES: grossly 4/5 prox to distal. LE: 2/5 prox to 3/5 distally. No sensory deficits.  Psychiatric: He has a normal mood and affect. His behavior is normal.     Lab Results Last 24 Hours    Results for orders placed or performed during the hospital encounter of 01/10/15  (from the past 24 hour(s))  Glucose, capillary Status: Abnormal   Collection Time: 01/19/15 12:15 PM  Result Value Ref Range   Glucose-Capillary 182 (H) 70 - 99 mg/dL  Glucose, capillary Status: Abnormal   Collection Time: 01/19/15 4:01 PM  Result Value Ref Range   Glucose-Capillary 235 (H) 70 - 99 mg/dL   Comment 1 Notify RN   Glucose, capillary Status: Abnormal   Collection Time: 01/19/15 8:32 PM  Result Value Ref Range   Glucose-Capillary 223 (H) 70 - 99 mg/dL  Glucose, capillary Status: Abnormal   Collection Time: 01/19/15 11:47 PM  Result Value Ref Range   Glucose-Capillary 178 (H) 70 - 99 mg/dL  Basic metabolic panel Status: Abnormal   Collection Time: 01/20/15 12:00 AM  Result Value Ref Range   Sodium 146 (H) 135 - 145 mmol/L   Potassium 3.5 3.5 - 5.1 mmol/L   Chloride 111 96 - 112 mmol/L   CO2 32 19 - 32 mmol/L   Glucose, Bld 133 (H) 70 - 99 mg/dL   BUN 17 6 - 23 mg/dL   Creatinine, Ser 0.56 0.50 - 1.35 mg/dL   Calcium 8.9 8.4 - 10.5 mg/dL   GFR calc non Af Amer >90 >90 mL/min   GFR calc Af Amer >90 >90 mL/min   Anion gap 3 (L) 5 - 15  Glucose, capillary Status: Abnormal   Collection Time: 01/20/15 3:45 AM  Result Value Ref Range   Glucose-Capillary 130 (H) 70 - 99 mg/dL  Glucose, capillary Status: None   Collection Time: 01/20/15 8:28 AM  Result Value Ref Range   Glucose-Capillary 88 70 - 99 mg/dL      Imaging Results (Last 48 hours)    No results found.    Assessment/Plan: Diagnosis: myasthenia gravis with crisis 1. Does the need for close, 24 hr/day medical supervision in concert with the patient's rehab needs make it unreasonable for this patient to be served in a less intensive setting? Yes 2. Co-Morbidities requiring supervision/potential complications: c diff, afib, respiratory failure 3. Due to bladder  management, bowel management, safety, skin/wound care, disease management, medication administration, pain management and patient education, does the patient require 24 hr/day rehab nursing? Yes 4. Does the patient require coordinated care of a physician, rehab nurse, PT (1-2 hrs/day, 5 days/week), OT (1-2 hrs/day, 5 days/week) and SLP (1-2 hrs/day, 5 days/week) to address physical and functional deficits in the context of the above medical diagnosis(es)? Yes Addressing deficits in the following areas: balance, endurance, locomotion, strength, transferring, bowel/bladder control, bathing, dressing, feeding, grooming, toileting, speech, swallowing and psychosocial support 5. Can the patient actively participate in an intensive therapy program of at least 3 hrs of therapy per day at least 5 days per week? Yes 6. The potential for patient to make measurable gains while on inpatient rehab is excellent 7. Anticipated functional outcomes upon  discharge from inpatient rehab are modified independent and supervision with PT, modified independent and supervision with OT, modified independent and supervision with SLP. 8. Estimated rehab length of stay to reach the above functional goals is: 7 days 9. Does the patient have adequate social supports and living environment to accommodate these discharge functional goals? Yes 10. Anticipated D/C setting: Home 11. Anticipated post D/C treatments: [place] therapy 12. Overall Rehab/Functional Prognosis: excellent  RECOMMENDATIONS: This patient's condition is appropriate for continued rehabilitative care in the following setting: CIR Patient has agreed to participate in recommended program. Yes Note that insurance prior authorization may be required for reimbursement for recommended care.  Comment: Rehab Admissions Coordinator to follow up.  Thanks,  Meredith Staggers, MD, Mellody Drown     01/20/2015       Revision History     Date/Time User Provider Type Action     01/20/2015 3:47 PM Meredith Staggers, MD Physician Sign   01/20/2015 2:58 PM Bary Leriche, PA-C Physician Assistant Pend   View Details Report       Routing History     Date/Time From To Method   01/20/2015 3:47 PM Meredith Staggers, MD Meredith Staggers, MD In [facility]          Physical Medicine and Rehabilitation Consult   Reason for Consult: SOB with fatigue due to [facility] exacerbation Referring Physician: Dr. Sherral Hammers.    HPI: Craig [name] is a 75 y.o. male with history of AFib, bladder cancer, DM type 2, MG with recent flare who was recently discharged to home past CIR on 01/02/15. He was seen by outpatient neurologist on 02/26 with plans to start home IVIG but was readmitted on 01/10/15 with fatigue, worsening of SOB and hypoxia. HD Cath placed by CCM on 02/28 and neurology recommended IVIG X 5 treatments. CCM consulted for close monitoring of respiratory status due to concerns of requiring intubation and he was started on IV zosyn for PNA. He developed diarrhea with recurrent c diff and was started on metronidazole on 03/05. He has had neck discomfort due to HD cath requiring IV fentanyl for pain management. ST evaluation done yesterday and patient started on trials of ice chips with plans for objective swallow evaluation. He has completed his treatment course of IVIG today and is reporting increase in strength as well as swallow function. MBS done today and patient started on regular diet with thin liquids. Therapy ongoing and patient showing improvement in activity tolerance. CIR recommended by MD and Rehab team.    Review of Systems  Eyes: Positive for blurred vision.  Respiratory: Positive for cough.  Neurological: Positive for focal weakness.      Past Medical History  Diagnosis Date  . Atrial fibrillation, chronic     on Xarelto   . HTN (hypertension)   . CHF (congestive heart failure)   . Diabetes mellitus   . 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 Myasthenia gravis     Past Surgical History  Procedure Laterality Date  . Abdominal wall metastasis removal      Family History  Problem Relation Age of Onset  . Colon cancer Mother   . Heart attack Father   . Diabetes Father     Social History:  reports that he has never smoked. He does not have any smokeless tobacco history on file. He reports that he does not drink alcohol or use illicit drugs.  Allergies  Allergen Reactions  . Ibuprofen Other (See Comments)    Interacts with another medication patient is taking    Medications Prior to Admission  Medication Sig Dispense Refill  . amiodarone (PACERONE) 100 MG tablet Place 1 tablet (100 mg total) into feeding tube daily.    Marland Kitchen antiseptic oral rinse (CPC / CETYLPYRIDINIUM CHLORIDE 0.05%) 0.05 % LIQD solution 7 mLs by Mouth Rinse route 2 (two) times daily. 946 mL 1  . artificial tears (LACRILUBE) OINT ophthalmic ointment Place into both eyes every 4 (four) hours as needed for dry eyes.  0  . atorvastatin (LIPITOR) 40 MG tablet Place 1 tablet (40 mg total) into feeding tube daily.    . cephALEXin (KEFLEX) 250 MG/5ML suspension Place 5 mLs (250 mg total) into feeding tube every 8 (eight) hours. 35 mL 0  . famotidine (PEPCID) 40 MG/5ML suspension Take 5 mLs (40 mg total) by mouth daily. 150 mL 0  . insulin aspart (NOVOLOG FLEXPEN) 100 UNIT/ML FlexPen Inject 0-10 Units into the skin 4 (four) times daily. Use according to scale prior to tube feeds. 15 mL 1  . ipratropium (ATROVENT HFA) 17 MCG/ACT inhaler Inhale 1 puff into the lungs every 6 (six) hours as needed for wheezing.    Marland Kitchen ipratropium (ATROVENT) 0.02 % nebulizer solution Inhale 2.5 mLs (0.5 mg total) into the lungs every 6 (six) hours as needed (wHEEZING.). 75 mL 12  . metoprolol tartrate (LOPRESSOR) 25 MG tablet  Place 0.5 tablets (12.5 mg total) into feeding tube daily.    . nateglinide (STARLIX) 120 MG tablet Place 1 tablet (120 mg total) into feeding tube 3 (three) times daily. 90 tablet 1  . Nutritional Supplements (FEEDING SUPPLEMENT, JEVITY 1.2 CAL,) LIQD Place 390 mLs into feeding tube 4 (four) times daily - with meals and at bedtime.  0  . potassium chloride 20 MEQ/15ML (10%) SOLN Place 15 mLs (20 mEq total) into feeding tube 3 (three) times daily. 3800 mL 0  . predniSONE (DELTASONE) 20 MG tablet Place 3 tablets (60 mg total) into feeding tube daily with breakfast. 150 tablet 1  . pyridostigmine (MESTINON) 60 MG tablet 1.5 tablets (90 mg total) by Per NG tube route 4 (four) times daily. 135 tablet 1  . rivaroxaban (XARELTO) 20 MG TABS tablet Take 1 tablet (20 mg total) by mouth daily with supper. (Patient taking differently: Place 20 mg into feeding tube daily with supper. )    . chlorhexidine (PERIDEX) 0.12 % solution 15 mLs by Mouth Rinse route 2 (two) times daily. 120 mL 0  . ondansetron (ZOFRAN) 4 MG tablet Take 1 tablet (4 mg total) by mouth every 6 (six) hours as needed for nausea. 20 tablet 0  . senna (SENOKOT) 8.6 MG TABS tablet Place 1 tablet (8.6 mg total) into feeding tube daily as needed for mild constipation. (Patient not taking: Reported on 01/10/2015) 120 each 0  . Water For Irrigation, Sterile (FREE WATER) SOLN Place 150 mLs into feeding tube 4 (four) times daily - after meals and at bedtime.      Home: Home Living Family/patient expects to be discharged to:: Unsure Living Arrangements: Spouse/significant other Available Help at Discharge: Family, Available 24 hours/day Type of Home: House Home Access: Stairs to enter [employer] of Steps: 3 Entrance Stairs-Rails: Right Home Layout: One level, Laundry or work area in [place]: Environmental consultant - 2 wheels, Radio producer - single point, Bedside commode, Shower  seat Lives With: Spouse  Functional History: Prior Function Level  of Independence: Needs assistance Gait / Transfers Assistance Needed: RW for gait ADL's / Homemaking Assistance Needed: needs assist to bath. Comments: Infor represents time since returning home from previous CIR stay. Functional Status:  Mobility: Bed Mobility Overal bed mobility: Needs Assistance Bed Mobility: Supine to Sit, Sit to Supine Supine to sit: Min guard, HOB elevated Sit to supine: Min guard General bed mobility comments: No physical assist required, patient able to come to EOB quickly, min guard for safety wikth lines Transfers Overall transfer level: Needs assistance Equipment used: Rolling walker (2 wheeled), 2 person hand held assist Transfers: Sit to/from Stand, Stand Pivot Transfers Sit to Stand: Min assist, +2 physical assistance Stand pivot transfers: Min guard General transfer comment: min assist to elevate to standing, performed x4 during session with extended static standing trials. Vcs for hand placement and safety       ADL:    Cognition: Cognition Overall Cognitive Status: Impaired/Different from baseline Orientation Level: Oriented X4 Cognition Arousal/Alertness: Awake/alert Behavior During Therapy: Flat affect Overall Cognitive Status: Impaired/Different from baseline Area of Impairment: Safety/judgement, Problem solving Safety/Judgement: Decreased awareness of safety Problem Solving: Slow processing, Requires verbal cues, Requires tactile cues  Blood pressure 111/70, pulse 72, temperature 96.6 F (35.9 C), temperature source Oral, resp. rate 21, height 5\' 7"  (1.702 m), weight 106.3 kg (234 lb 5.6 oz), SpO2 94 %. Physical Exam  Constitutional: He appears well-developed and well-nourished.  HENT:  Head: Normocephalic.  Skin breakdown over nose  Cardiovascular: Normal rate.  Respiratory: Effort normal.  Neurological:  Speech clearer. Has better oral-motor control. Mild  ptosis and dysarthria still. UES: grossly 4/5 prox to distal. LE: 2/5 prox to 3/5 distally. No sensory deficits.  Psychiatric: He has a normal mood and affect. His behavior is normal.     Lab Results Last 24 Hours    Results for orders placed or performed during the hospital encounter of 01/10/15 (from the past 24 hour(s))  Glucose, capillary Status: Abnormal   Collection Time: 01/19/15 12:15 PM  Result Value Ref Range   Glucose-Capillary 182 (H) 70 - 99 mg/dL  Glucose, capillary Status: Abnormal   Collection Time: 01/19/15 4:01 PM  Result Value Ref Range   Glucose-Capillary 235 (H) 70 - 99 mg/dL   Comment 1 Notify RN   Glucose, capillary Status: Abnormal   Collection Time: 01/19/15 8:32 PM  Result Value Ref Range   Glucose-Capillary 223 (H) 70 - 99 mg/dL  Glucose, capillary Status: Abnormal   Collection Time: 01/19/15 11:47 PM  Result Value Ref Range   Glucose-Capillary 178 (H) 70 - 99 mg/dL  Basic metabolic panel Status: Abnormal   Collection Time: 01/20/15 12:00 AM  Result Value Ref Range   Sodium 146 (H) 135 - 145 mmol/L   Potassium 3.5 3.5 - 5.1 mmol/L   Chloride 111 96 - 112 mmol/L   CO2 32 19 - 32 mmol/L   Glucose, Bld 133 (H) 70 - 99 mg/dL   BUN 17 6 - 23 mg/dL   Creatinine, Ser 0.56 0.50 - 1.35 mg/dL   Calcium 8.9 8.4 - 10.5 mg/dL   GFR calc non Af Amer >90 >90 mL/min   GFR calc Af Amer >90 >90 mL/min   Anion gap 3 (L) 5 - 15  Glucose, capillary Status: Abnormal   Collection Time: 01/20/15 3:45 AM  Result Value Ref Range   Glucose-Capillary 130 (H) 70 - 99 mg/dL  Glucose, capillary Status: None   Collection Time: 01/20/15 8:28 AM  Result Value Ref  Range   Glucose-Capillary 88 70 - 99 mg/dL      Imaging Results (Last 48 hours)    No results found.    Assessment/Plan: Diagnosis: myasthenia gravis with  crisis 10. Does the need for close, 24 hr/day medical supervision in concert with the patient's rehab needs make it unreasonable for this patient to be served in a less intensive setting? Yes 14. Co-Morbidities requiring supervision/potential complications: c diff, afib, respiratory failure 15. Due to bladder management, bowel management, safety, skin/wound care, disease management, medication administration, pain management and patient education, does the patient require 24 hr/day rehab nursing? Yes 16. Does the patient require coordinated care of a physician, rehab nurse, PT (1-2 hrs/day, 5 days/week), OT (1-2 hrs/day, 5 days/week) and SLP (1-2 hrs/day, 5 days/week) to address physical and functional deficits in the context of the above medical diagnosis(es)? Yes Addressing deficits in the following areas: balance, endurance, locomotion, strength, transferring, bowel/bladder control, bathing, dressing, feeding, grooming, toileting, speech, swallowing and psychosocial support 17. Can the patient actively participate in an intensive therapy program of at least 3 hrs of therapy per day at least 5 days per week? Yes 18. The potential for patient to make measurable gains while on inpatient rehab is excellent 19. Anticipated functional outcomes upon discharge from inpatient rehab are modified independent and supervision with PT, modified independent and supervision with OT, modified independent and supervision with SLP. 20. Estimated rehab length of stay to reach the above functional goals is: 7 days 21. Does the patient have adequate social supports and living environment to accommodate these discharge functional goals? Yes 22. Anticipated D/C setting: Home 23. Anticipated post D/C treatments: [place] therapy 24. Overall Rehab/Functional Prognosis: excellent  RECOMMENDATIONS: This patient's condition is appropriate for continued rehabilitative care in the following setting: CIR Patient has agreed to  participate in recommended program. Yes Note that insurance prior authorization may be required for reimbursement for recommended care.  Comment: Rehab Admissions Coordinator to follow up.  Thanks,  Meredith Staggers, MD, Mellody Drown     01/20/2015       Revision History     Date/Time User Provider Type Action   01/20/2015 3:47 PM Meredith Staggers, MD Physician Sign   01/20/2015 2:58 PM Bary Leriche, PA-C Physician Assistant Pend   View Details Report       Routing History     Date/Time From To Method   01/20/2015 3:47 PM Meredith Staggers, MD Meredith Staggers, MD In [facility]          Physical Medicine and Rehabilitation Consult   Reason for Consult: SOB with fatigue due to [facility] exacerbation Referring Physician: Dr. Sherral Hammers.    HPI: Craig [name] is a 74 y.o. male with history of AFib, bladder cancer, DM type 2, MG with recent flare who was recently discharged to home past CIR on 01/02/15. He was seen by outpatient neurologist on 02/26 with plans to start home IVIG but was readmitted on 01/10/15 with fatigue, worsening of SOB and hypoxia. HD Cath placed by CCM on 02/28 and neurology recommended IVIG X 5 treatments. CCM consulted for close monitoring of respiratory status due to concerns of requiring intubation and he was started on IV zosyn for PNA. He developed diarrhea with recurrent c diff and was started on metronidazole on 03/05. He has had neck discomfort due to HD cath requiring IV fentanyl for pain management. ST evaluation done yesterday and patient started on trials of ice chips with plans for objective swallow  evaluation. He has completed his treatment course of IVIG today and is reporting increase in strength as well as swallow function. MBS done today and patient started on regular diet with thin liquids. Therapy ongoing and patient showing improvement in activity tolerance. CIR recommended by MD and Rehab team.    Review of Systems  Eyes: Positive for blurred  vision.  Respiratory: Positive for cough.  Neurological: Positive for focal weakness.      Past Medical History  Diagnosis Date  . Atrial fibrillation, chronic     on Xarelto   . HTN (hypertension)   . CHF (congestive heart failure)   . Diabetes mellitus   . 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 Myasthenia gravis     Past Surgical History  Procedure Laterality Date  . Abdominal wall metastasis removal      Family History  Problem Relation Age of Onset  . Colon cancer Mother   . Heart attack Father   . Diabetes Father     Social History:  reports that he has never smoked. He does not have any smokeless tobacco history on file. He reports that he does not drink alcohol or use illicit drugs.    Allergies  Allergen Reactions  . Ibuprofen Other (See Comments)    Interacts with another medication patient is taking    Medications Prior to Admission  Medication Sig Dispense Refill  . amiodarone (PACERONE) 100 MG tablet Place 1 tablet (100 mg total) into feeding tube daily.    Marland Kitchen antiseptic oral rinse (CPC / CETYLPYRIDINIUM CHLORIDE 0.05%) 0.05 % LIQD solution 7 mLs by Mouth Rinse route 2 (two) times daily. 946 mL 1  . artificial tears (LACRILUBE) OINT ophthalmic ointment Place into both eyes every 4 (four) hours as needed for dry eyes.  0  . atorvastatin (LIPITOR) 40 MG tablet Place 1 tablet (40 mg total) into feeding tube daily.    . cephALEXin (KEFLEX) 250 MG/5ML suspension Place 5 mLs (250 mg total) into feeding tube every 8 (eight) hours. 35 mL 0  . famotidine (PEPCID) 40 MG/5ML suspension Take 5 mLs (40 mg total) by mouth daily. 150 mL 0  . insulin aspart (NOVOLOG FLEXPEN) 100 UNIT/ML FlexPen Inject 0-10 Units into the skin 4 (four) times daily. Use according to scale prior to tube feeds. 15 mL 1  .  ipratropium (ATROVENT HFA) 17 MCG/ACT inhaler Inhale 1 puff into the lungs every 6 (six) hours as needed for wheezing.    Marland Kitchen ipratropium (ATROVENT) 0.02 % nebulizer solution Inhale 2.5 mLs (0.5 mg total) into the lungs every 6 (six) hours as needed (wHEEZING.). 75 mL 12  . metoprolol tartrate (LOPRESSOR) 25 MG tablet Place 0.5 tablets (12.5 mg total) into feeding tube daily.    . nateglinide (STARLIX) 120 MG tablet Place 1 tablet (120 mg total) into feeding tube 3 (three) times daily. 90 tablet 1  . Nutritional Supplements (FEEDING SUPPLEMENT, JEVITY 1.2 CAL,) LIQD Place 390 mLs into feeding tube 4 (four) times daily - with meals and at bedtime.  0  . potassium chloride 20 MEQ/15ML (10%) SOLN Place 15 mLs (20 mEq total) into feeding tube 3 (three) times daily. 3800 mL 0  . predniSONE (DELTASONE) 20 MG tablet Place 3 tablets (60 mg total) into feeding tube daily with breakfast. 150 tablet 1  . pyridostigmine (MESTINON) 60 MG tablet 1.5 tablets (90 mg total) by Per NG tube route 4 (four) times daily.  135 tablet 1  . rivaroxaban (XARELTO) 20 MG TABS tablet Take 1 tablet (20 mg total) by mouth daily with supper. (Patient taking differently: Place 20 mg into feeding tube daily with supper. )    . chlorhexidine (PERIDEX) 0.12 % solution 15 mLs by Mouth Rinse route 2 (two) times daily. 120 mL 0  . ondansetron (ZOFRAN) 4 MG tablet Take 1 tablet (4 mg total) by mouth every 6 (six) hours as needed for nausea. 20 tablet 0  . senna (SENOKOT) 8.6 MG TABS tablet Place 1 tablet (8.6 mg total) into feeding tube daily as needed for mild constipation. (Patient not taking: Reported on 01/10/2015) 120 each 0  . Water For Irrigation, Sterile (FREE WATER) SOLN Place 150 mLs into feeding tube 4 (four) times daily - after meals and at bedtime.      Home: Home Living Family/patient expects to be discharged to:: Unsure Living Arrangements: Spouse/significant  other Available Help at Discharge: Family, Available 24 hours/day Type of Home: House Home Access: Stairs to enter [employer] of Steps: 3 Entrance Stairs-Rails: Right Home Layout: One level, Laundry or work area in [place]: Environmental consultant - 2 wheels, Radio producer - single point, Bedside commode, Shower seat Lives With: Spouse  Functional History: Prior Function Level of Independence: Needs assistance Gait / Transfers Assistance Needed: RW for gait ADL's / Homemaking Assistance Needed: needs assist to bath. Comments: Infor represents time since returning home from previous CIR stay. Functional Status:  Mobility: Bed Mobility Overal bed mobility: Needs Assistance Bed Mobility: Supine to Sit, Sit to Supine Supine to sit: Min guard, HOB elevated Sit to supine: Min guard General bed mobility comments: No physical assist required, patient able to come to EOB quickly, min guard for safety wikth lines Transfers Overall transfer level: Needs assistance Equipment used: Rolling walker (2 wheeled), 2 person hand held assist Transfers: Sit to/from Stand, Stand Pivot Transfers Sit to Stand: Min assist, +2 physical assistance Stand pivot transfers: Min guard General transfer comment: min assist to elevate to standing, performed x4 during session with extended static standing trials. Vcs for hand placement and safety       ADL:    Cognition: Cognition Overall Cognitive Status: Impaired/Different from baseline Orientation Level: Oriented X4 Cognition Arousal/Alertness: Awake/alert Behavior During Therapy: Flat affect Overall Cognitive Status: Impaired/Different from baseline Area of Impairment: Safety/judgement, Problem solving Safety/Judgement: Decreased awareness of safety Problem Solving: Slow processing, Requires verbal cues, Requires tactile cues  Blood pressure 111/70, pulse 72, temperature 96.6 F (35.9 C), temperature source Oral, resp. rate 21, height 5\' 7"   (1.702 m), weight 106.3 kg (234 lb 5.6 oz), SpO2 94 %. Physical Exam  Constitutional: He appears well-developed and well-nourished.  HENT:  Head: Normocephalic.  Skin breakdown over nose  Cardiovascular: Normal rate.  Respiratory: Effort normal.  Neurological:  Speech clearer. Has better oral-motor control. Mild ptosis and dysarthria still. UES: grossly 4/5 prox to distal. LE: 2/5 prox to 3/5 distally. No sensory deficits.  Psychiatric: He has a normal mood and affect. His behavior is normal.     Lab Results Last 24 Hours    Results for orders placed or performed during the hospital encounter of 01/10/15 (from the past 24 hour(s))  Glucose, capillary Status: Abnormal   Collection Time: 01/19/15 12:15 PM  Result Value Ref Range   Glucose-Capillary 182 (H) 70 - 99 mg/dL  Glucose, capillary Status: Abnormal   Collection Time: 01/19/15 4:01 PM  Result Value Ref Range   Glucose-Capillary 235 (H) 70 -  99 mg/dL   Comment 1 Notify RN   Glucose, capillary Status: Abnormal   Collection Time: 01/19/15 8:32 PM  Result Value Ref Range   Glucose-Capillary 223 (H) 70 - 99 mg/dL  Glucose, capillary Status: Abnormal   Collection Time: 01/19/15 11:47 PM  Result Value Ref Range   Glucose-Capillary 178 (H) 70 - 99 mg/dL  Basic metabolic panel Status: Abnormal   Collection Time: 01/20/15 12:00 AM  Result Value Ref Range   Sodium 146 (H) 135 - 145 mmol/L   Potassium 3.5 3.5 - 5.1 mmol/L   Chloride 111 96 - 112 mmol/L   CO2 32 19 - 32 mmol/L   Glucose, Bld 133 (H) 70 - 99 mg/dL   BUN 17 6 - 23 mg/dL   Creatinine, Ser 0.56 0.50 - 1.35 mg/dL   Calcium 8.9 8.4 - 10.5 mg/dL   GFR calc non Af Amer >90 >90 mL/min   GFR calc Af Amer >90 >90 mL/min   Anion gap 3 (L) 5 - 15  Glucose, capillary Status: Abnormal   Collection Time: 01/20/15 3:45 AM  Result Value Ref Range    Glucose-Capillary 130 (H) 70 - 99 mg/dL  Glucose, capillary Status: None   Collection Time: 01/20/15 8:28 AM  Result Value Ref Range   Glucose-Capillary 88 70 - 99 mg/dL      Imaging Results (Last 48 hours)    No results found.    Assessment/Plan: Diagnosis: myasthenia gravis with crisis 33. Does the need for close, 24 hr/day medical supervision in concert with the patient's rehab needs make it unreasonable for this patient to be served in a less intensive setting? Yes 26. Co-Morbidities requiring supervision/potential complications: c diff, afib, respiratory failure 27. Due to bladder management, bowel management, safety, skin/wound care, disease management, medication administration, pain management and patient education, does the patient require 24 hr/day rehab nursing? Yes 28. Does the patient require coordinated care of a physician, rehab nurse, PT (1-2 hrs/day, 5 days/week), OT (1-2 hrs/day, 5 days/week) and SLP (1-2 hrs/day, 5 days/week) to address physical and functional deficits in the context of the above medical diagnosis(es)? Yes Addressing deficits in the following areas: balance, endurance, locomotion, strength, transferring, bowel/bladder control, bathing, dressing, feeding, grooming, toileting, speech, swallowing and psychosocial support 29. Can the patient actively participate in an intensive therapy program of at least 3 hrs of therapy per day at least 5 days per week? Yes 30. The potential for patient to make measurable gains while on inpatient rehab is excellent 31. Anticipated functional outcomes upon discharge from inpatient rehab are modified independent and supervision with PT, modified independent and supervision with OT, modified independent and supervision with SLP. 32. Estimated rehab length of stay to reach the above functional goals is: 7 days 33. Does the patient have adequate social supports and living environment to accommodate these  discharge functional goals? Yes 34. Anticipated D/C setting: Home 35. Anticipated post D/C treatments: HH therapy 36. Overall Rehab/Functional Prognosis: excellent  RECOMMENDATIONS: This patient's condition is appropriate for continued rehabilitative care in the following setting: CIR Patient has agreed to participate in recommended program. Yes Note that insurance prior authorization may be required for reimbursement for recommended care.  Comment: Rehab Admissions Coordinator to follow up.  Thanks,  Meredith Staggers, MD, Mellody Drown     01/20/2015       Revision History     Date/Time User Provider Type Action   01/20/2015 3:47 PM Meredith Staggers, MD Physician Sign   01/20/2015 2:58  PM Bary Leriche, PA-C Physician Assistant Pend   View Details Report       Routing History     Date/Time From To Method   01/20/2015 3:47 PM Meredith Staggers, MD Meredith Staggers, MD In Basket

## 2015-01-23 NOTE — Progress Notes (Signed)
Social Work Assessment and Plan  Patient Details  Name: Craig [name] MRN: [id number] Date of Birth: 03-17-1941  Today's Date: 01/23/2015  Problem List:  Patient Active Problem List   Diagnosis Date Noted  . Physical deconditioning   . C. difficile colitis   . Obstructive sleep apnea   . Benign essential hypertension antepartum   . HLD (hyperlipidemia)   . Hypophosphatemia   . Hypokalemia   . 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 Diabetes type 2, uncontrolled   . Respiratory abnormality   . Collapse   . Acute respiratory failure   . Myasthenia 01/10/2015  . Myasthenia gravis 01/08/2015  . 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 Myasthenia gravis with acute exacerbation 01/08/2015  . MG with exacerbation (myasthenia gravis) 12/29/2014  . Protein-calorie malnutrition, severe   . Esophageal reflux   . Chest pain 12/27/2014  . Chronic atrial fibrillation 12/27/2014  . Pain in the chest   . Diaphoresis   . Bradycardia   . Dysphagia   . MG, crisis (myasthenia gravis) 12/17/2014  . Dysphagia, pharyngoesophageal phase   . Enteritis due to Clostridium difficile   . Clostridium difficile diarrhea 12/15/2014  . Hypernatremia   . Acute exacerbation of myasthenia gravis   . Myasthenia gravis in crisis 12/05/2014  . Bladder cancer 12/05/2014  . HTN (hypertension) 12/05/2014  . A-fib 12/05/2014  . Chronic anticoagulation 12/05/2014  . Acute respiratory failure with hypoxia 12/05/2014   Past Medical History:  Past Medical History  Diagnosis Date  . Atrial fibrillation, chronic     on Xarelto   . HTN (hypertension)   . CHF (congestive heart failure)   . Diabetes mellitus   . 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 Myasthenia gravis    Past Surgical History:  Past Surgical History  Procedure Laterality Date  . Abdominal wall metastasis removal     Social History:  reports that he has never  smoked. He does not have any smokeless tobacco history on file. He reports that he does not drink alcohol or use illicit drugs.  Family / Support Systems Marital Status: Married How Long?: 41 years Patient Roles: Spouse, Parent Spouse/Significant Other: Craig [name] - wife - [phone] Children: 2 dtrs and 1 son - Craig [name] - son - [phone] Other Supports: extended family Anticipated Caregiver: wife Ability/Limitations of Caregiver: no limitations Caregiver Availability: 24/7 Family Dynamics: close, supportive wife and family  Social History Preferred language: English Religion:  Read: Yes Write: Yes Employment Status: Retired Date Retired/Disabled/Unemployed: 2006 Age Retired: 63 Public relations account executive Issues: none reported Guardian/Conservator: N/A   Abuse/Neglect Physical Abuse: Denies Verbal Abuse: Denies Sexual Abuse: Denies Exploitation of patient/patient's resources: Denies Self-Neglect: Denies  Emotional Status Pt's affect, behavior and adjustment status: Pt reports he is "hanging in there" and was enjoying being home for the week he was there prior to coming back into the hospital.  He is not thrilled to be back on CIR, but knows this will help him get back home. Recent Psychosocial Issues: Pt has been in and out of the hospital over the last two months. Psychiatric History: none reported Substance Abuse History: none reported  Patient / Family Perceptions, Expectations & Goals Pt/Family understanding of illness & functional limitations: Pt/wife have a good understanding of pt's condition and do not have any unanswered questions.  Wife reports things were going okay at home and that she is "holding  on". Premorbid pt/family roles/activities: Pt/wife have a large yard and like doing yard work together.  They also enjoy spending time with their 4 grandchildren. Anticipated changes in roles/activities/participation: Pt wants to get better so that he can spend  time with his grandchildren. Pt/family expectations/goals: Pt wants to go home.  Getting home to his grandchildren is very motivating to him.  Community Resources [employer]s: None Premorbid Home Care/DME Agencies:  (Pt has [facility] for RN/PT/OT/ST and uses [facility] for his O2, suction equipment, and tube feeding.) Transportation available at discharge: family Resource referrals recommended: Neuropsychology, Support group (specify)  Discharge Planning Living Arrangements: Spouse/significant other Support Systems: Spouse/significant other, Children, Other relatives Type of Residence: Private residence Insurance Resources: Multimedia programmer (specify) ([employer]) Financial Resources: Radio broadcast assistant Screen Referred: No Money Management: Patient, Spouse Does the patient have any problems obtaining your medications?: No Home Management: Pt's wife is used to managing the inside of the home.  She shared outside chores with pt, but feels this is manageable.  Patient/Family Preliminary Plans: Pt plans to return to his home with his wife. Barriers to Discharge: Steps Social Work Anticipated Follow Up Needs: HH/OP Expected length of stay: 7 days  Clinical Impression Pt is know to CSW from two previous CIR admissions.  CSW went to visit pt and spoke with wife via telephone to let them know that this CSW would be following them again and be available to them, as needed.  Pt's wife reported that they were doing okay at home, but were only there for a week.  Pt and wife seem frustrated to be back in the hospital, but pt knows he will get stronger on rehab.  They have no current concerns/questions/needs.  CSW will continue to follow pt and assist as needed.  Cyrena Kuchenbecker, Silvestre Mesi 01/23/2015, 10:35 AM

## 2015-01-23 NOTE — Evaluation (Signed)
Speech Language Pathology Assessment and Plan  Patient Details  Name: Craig [name] MRN: [id number] Date of Birth: [birth year]  SLP Diagnosis: Dysarthria;Dysphagia  Rehab Potential: Good ELOS: 1 week for SLP     Today's Date: 01/23/2015 SLP Individual Time: 1330-1430 SLP Individual Time Calculation (min): 60 min   Problem List:  Patient Active Problem List   Diagnosis Date Noted  . Physical deconditioning   . C. difficile colitis   . Obstructive sleep apnea   . Benign essential hypertension antepartum   . HLD (hyperlipidemia)   . Hypophosphatemia   . Hypokalemia   . 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 Diabetes type 2, uncontrolled   . Respiratory abnormality   . Collapse   . Acute respiratory failure   . Myasthenia 01/10/2015  . Myasthenia gravis 01/08/2015  . 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 Myasthenia gravis with acute exacerbation 01/08/2015  . MG with exacerbation (myasthenia gravis) 12/29/2014  . Protein-calorie malnutrition, severe   . Esophageal reflux   . Chest pain 12/27/2014  . Chronic atrial fibrillation 12/27/2014  . Pain in the chest   . Diaphoresis   . Bradycardia   . Dysphagia   . MG, crisis (myasthenia gravis) 12/17/2014  . Dysphagia, pharyngoesophageal phase   . Enteritis due to Clostridium difficile   . Clostridium difficile diarrhea 12/15/2014  . Hypernatremia   . Acute exacerbation of myasthenia gravis   . Myasthenia gravis in crisis 12/05/2014  . Bladder cancer 12/05/2014  . HTN (hypertension) 12/05/2014  . A-fib 12/05/2014  . Chronic anticoagulation 12/05/2014  . Acute respiratory failure with hypoxia 12/05/2014   Past Medical History:  Past Medical History  Diagnosis Date  . Atrial fibrillation, chronic     on Xarelto   . HTN (hypertension)   . CHF (congestive heart failure)   . Diabetes mellitus   . 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  Myasthenia gravis    Past Surgical History:  Past Surgical History  Procedure Laterality Date  . Abdominal wall metastasis removal      Assessment / Plan / Recommendation Clinical Impression Patient is a 74 y.o. male with history of AFib, bladder cancer, DM type 2, MG with recent flare who was recently discharged to home past CIR on 01/02/15. He was seen by outpatient neurologist on 02/26 with plans to start home IVIG but was readmitted on 01/10/15 with fatigue, worsening of SOB and hypoxia. HD cath placed by CCM on 02/28 and neurology recommended IVIG X 5 treatments.  CCM consulted for close monitoring of respiratory status due to concerns of requiring intubation and he was started on IV zosyn for PNA. He developed diarrhea with recurrent c diff and was started on metronidazole on 03/05.  He has had neck discomfort due to HD cath requiring IV fentanyl for pain management. He has completed his treatment course of IVIG today and is reporting increase in strength as well as swallow function. MBS done 03/08 and patient started on regular diet with thin liquids. Therapy ongoing and patient showing improvement in activity tolerance. CIR recommended by MD and Rehab team. Patient admitted to CIR on 01/22/15 and administered a BSE and cognitive-linguistic evaluation.  Patient consumed trials of thin liquids via straw and regular textures without overt s/s of aspiration, however, patient also utilized multiple swallows and appeared to "swish" all liquids prior to swallow initiation with minimal right anterior spillage of liquids.  Recommend patient continue current diet of regular textures with thin liquids. Patient also administered a cognitive-linguistic evaluation. Patient's cognitive function appears WFL, however, patient has a flat affect and was labile throughout the evaluation and reported feeling discouraged, therefore, recommend neuropsych consult. Patient demonstrates mild dysarthria characterized by a low  vocal intensity and imprecise consonants due to oral-motor weakness. Patient would benefit from skilled SLP intervention to maximize functional communication and swallowing function in order to maximize his overall functional independence prior to discharge home.   Skilled Therapeutic Interventions          Administered a BSE and cognitive-linguistic evaluation. Please see above for details.   SLP Assessment  Patient will need skilled Speech Lanaguage Pathology Services during CIR admission    Recommendations  Diet Recommendations: Regular;Thin liquid Liquid Administration via: Cup;Straw Medication Administration: Whole meds with puree Supervision: Intermittent supervision to cue for compensatory strategies Compensations: Slow rate;Small sips/bites;Follow solids with liquid Postural Changes and/or Swallow Maneuvers: Seated upright 90 degrees Oral Care Recommendations: Oral care BID Recommendations for Other Services: Neuropsych consult Patient destination: Home Follow up Recommendations:  (TBD) Equipment Recommended: None recommended by SLP    SLP Frequency 1 to 3 out of 7 days   SLP Treatment/Interventions Dysphagia/aspiration precaution training;Cueing hierarchy;Patient/family education;Therapeutic Activities;Oral motor exercises;Speech/Language facilitation    Pain No/Denies Pain   Short Term Goals: Week 1: SLP Short Term Goal 1 (Week 1): Patient will consume current diet without overt s/s of aspiration with Mod I for use of swallowing compensatory strategies.  SLP Short Term Goal 2 (Week 1): Patient will utilize over articulation and an increased vocal intensity to maximize speech intelligibility at the sentence level to 100% accuracy with Mod I.   See FIM for current functional status Refer to Care Plan for Long Term Goals  Recommendations for other services: Neuropsych  Discharge Criteria: Patient will be discharged from SLP if patient refuses treatment 3 consecutive times  without medical reason, if treatment goals not met, if there is a change in medical status, if patient makes no progress towards goals or if patient is discharged from hospital.  The above assessment, treatment plan, treatment alternatives and goals were discussed and mutually agreed upon: by patient and by family  Kerria Sapien 01/23/2015, 4:13 PM

## 2015-01-23 NOTE — Plan of Care (Signed)
Problem: RH PAIN MANAGEMENT Goal: RH STG PAIN MANAGED AT OR BELOW PT'S PAIN GOAL Outcome: Not Progressing Requesting PRN pain medication prior to allowable dosing

## 2015-01-23 NOTE — Plan of Care (Signed)
Problem: RH SKIN INTEGRITY Goal: RH STG ABLE TO PERFORM INCISION/WOUND CARE W/ASSISTANCE STG Able To Perform Incision/Wound Care With [employer].  Outcome: Progressing Staff perform peg tube care and site care daily

## 2015-01-23 NOTE — Plan of Care (Signed)
Problem: RH PAIN MANAGEMENT Goal: RH STG PAIN MANAGED AT OR BELOW PT'S PAIN GOAL Pain managed with prn medication at or below patient's goal of 9 Outcome: Not Progressing Reports tylenol does not work and requests administration within 2-3 hrs of last administration; lower abd pain

## 2015-01-23 NOTE — Progress Notes (Signed)
[place] PHYSICAL MEDICINE & REHABILITATION     PROGRESS NOTE    Subjective/Complaints: Had a good night. Denies pain. No coughing, sob. Ready to get started today  Objective: Vital Signs: Blood pressure 115/67, pulse 67, temperature 97.2 F (36.2 C), temperature source Oral, resp. rate 18, weight 110.1 kg (242 lb 11.6 oz), SpO2 95 %. No results found.  Recent Labs  01/21/15 0445 01/23/15 0608  WBC 13.2* 9.7  HGB 13.2 10.9*  HCT 40.4 33.4*  PLT 190 171    Recent Labs  01/21/15 0445 01/23/15 0608  NA 143 142  K 3.7 3.5  CL 111 104  GLUCOSE 136* 156*  BUN 15 16  CREATININE 0.60 0.60  CALCIUM 8.7 8.4   CBG (last 3)   Recent Labs  01/22/15 1717 01/23/15 0004 01/23/15 0610  GLUCAP 173* 120* 178*    Wt Readings from Last 3 Encounters:  01/23/15 110.1 kg (242 lb 11.6 oz)  01/21/15 107.6 kg (237 lb 3.4 oz)  01/08/15 99.791 kg (220 lb)    Physical Exam:   General: Alert and oriented x 3, No apparent distress HEENT: Head is normocephalic, atraumatic, PERRLA, EOMI, sclera anicteric, oral mucosa pink and moist, dentition intact, ext ear canals clear,  Neck: Supple without JVD or lymphadenopathy Heart: Reg rate and rhythm. No murmurs rubs or gallops Chest: CTA bilaterally without wheezes, rales, or rhonchi; no distress Abdomen: Soft, non-tender, non-distended, bowel sounds positive. Extremities: No clubbing, cyanosis, or edema. Pulses are 2+ Skin: breakdown over bridge of nose healing. Scrotum and inguinal area reddened. Condom cath in place. Stasis changes bilateral LE's.  Neuro: Pt is cognitively appropriate reasonable insight, memory,  and awareness. Minimal ptosis. Improved oral-motor control speech much clearer. Phonation better. Sensory exam is normal. Reflexes are 2+ in all 4's. Fine motor coordination is intact. No tremors. Motor function is grossly 4/5 uppers and 3+ to 4/5 lowers. Improved trunk control.  Musculoskeletal: Full ROM, No pain with AROM or  PROM in the neck, trunk, or extremities. Posture appropriate Psych: Pt's affect is appropriate. Pt is cooperative    Assessment/Plan: 1. Functional deficits secondary to myasthenia gravis with crisis which require 3+ hours per day of interdisciplinary therapy in a comprehensive inpatient rehab setting. Physiatrist is providing close team supervision and 24 hour management of active medical problems listed below. Physiatrist and rehab team continue to assess barriers to discharge/monitor patient progress toward functional and medical goals. FIM:                                  Medical Problem List and Plan: 1. Functional deficits secondary to MG crisis 2. DVT Prophylaxis/Anticoagulation: Pharmaceutical: Xarelto 3. Pain Management: Tylenol prn.  4. Mood: LCSW to follow for support. 5. Neuropsych: This patient is capable of making decisions on her own behalf. 6. Skin/Wound Care: Routine pressure relief measures. Added nystatin powder to help with moisture/yeast in periarea.  7. Fluids/Electrolytes/Nutrition: eating little. changed tube feeds to nighttime to help with appetite during the day. Added water flushes.  8. DM type 2: Was on oral meds at home with good control. resumed Starlix and tapering lantus. May resume additional oral agent. Monitor BS with ac/hs checks. SSI for elevated BS.  -fair control at present 12. A fib: Monitor heart rate every 8 hours. Continue amiodarone and Xarelto 13. 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  Continue flagyl   -probiotic/fiber  -transition to oral diet should  help also    LOS (Days) 1 A FACE TO FACE EVALUATION WAS PERFORMED  Morrigan Wickens T 01/23/2015 8:45 AM

## 2015-01-23 NOTE — Progress Notes (Signed)
INITIAL NUTRITION ASSESSMENT  DOCUMENTATION CODES Per approved criteria  -Obesity Unspecified   INTERVENTION: Discontinue Prostat.  Continue free water flushes of 100 ml TID between meals via PEG.  Encourage adequate PO intake.  NUTRITION DIAGNOSIS: Increased nutrient needs related to therapy as evidenced by estimated nutrition needs.   Goal: Pt to meet >/= 90% of their estimated nutrition needs   Monitor:  PO intake, weight trends, labs, I/O's  Reason for Assessment: MST  74 y.o. male  Admitting Dx: MG, crisis (myasthenia gravis)  ASSESSMENT: Pt with history of AFib, bladder cancer, DM type 2, MG with recent flare who was recently discharged to home past CIR on 01/02/15. He was seen by outpatient neurologist on 02/26 with plans to start home IVIG but was readmitted on 01/10/15 with fatigue,worsening of SOB and hypoxia.  Pt is familiar to this RD from previous hospital admission. Pt has been receiving TF at home PTA. This AM nocturnal tube feedings have been discontinued. Spoke with PA, pt's appetite is good along with good po intake, this no further need for tube feedings. Pt's diet has been advanced to a regular diet as pt's wife reports she is able to count carbohydrates when ordering meals. Meal completion has been 65-80%. Pt declined supplements as he reports he is eating great and will continue to. RD to discontinue Prostat per tube has pt has been improving on meal intake. Pt was encouraged to eat his food at meals. Wife was educated pt will nee 4-5 serving sof carbohydrates at each meal. Will continue to monitor.   Pt with no observed significant fat or muscle mass loss.  Labs: Low alkaline phosphatase.  Height: Ht Readings from Last 1 Encounters:  01/11/15 5\' 7"  (1.702 m)    Weight: Wt Readings from Last 1 Encounters:  01/23/15 242 lb 11.6 oz (110.1 kg)    Ideal Body Weight: 148 lbs  % Ideal Body Weight: 164%  Wt Readings from Last 10 Encounters:   01/23/15 242 lb 11.6 oz (110.1 kg)  01/21/15 237 lb 3.4 oz (107.6 kg)  01/08/15 220 lb (99.791 kg)  01/02/15 217 lb 6 oz (98.6 kg)  12/29/14 233 lb 11.2 oz (106.006 kg)  12/17/14 236 lb 12.4 oz (107.4 kg)  05/03/10 290 lb (131.543 kg)    Usual Body Weight: 235 lbs  % Usual Body Weight: 103%  BMI:  Body mass index is 38.01 kg/(m^2). Class II obesity  Estimated Nutritional Needs: Kcal: 1850-2100 Protein: 95-115 grams Fluid: >/= 1.8 L/day  Skin: Stage I pressure ulcer on buttocks, stage II pressure ulcer on nose  Diet Order: Diet regular  EDUCATION NEEDS: -Education needs addressed   Intake/Output Summary (Last 24 hours) at 01/23/15 1514 Last data filed at 01/23/15 1200  Gross per 24 hour  Intake 1982.75 ml  Output    976 ml  Net 1006.75 ml    Last BM: 3/11  Labs:   Recent Labs Lab 01/17/15 0308 01/18/15 0330  01/20/15 01/20/15 0954 01/21/15 0445 01/23/15 0608  NA 143 144  < > 146* 148* 143 142  K 3.5 3.0*  < > 3.5 3.6 3.7 3.5  CL 109 106  < > 111 104 111 104  CO2 28 34*  < > 32  --  28 32  BUN 14 15  < > 17 17 15 16   CREATININE 0.58 0.51  < > 0.56 0.80 0.60 0.60  CALCIUM 8.9 8.8  < > 8.9  --  8.7 8.4  MG 1.7 1.7  --   --   --  1.7  --   PHOS  --  3.1  --   --   --  3.1  --   GLUCOSE 136* 125*  < > 133* 92 136* 156*  < > = values in this interval not displayed.  CBG (last 3)   Recent Labs  01/23/15 0833 01/23/15 1154 01/23/15 1504  GLUCAP 97 143* 224*    Scheduled Meds: . amiodarone  100 mg Per Tube Daily  . bacitracin  1 application Topical BID  . famotidine  40 mg Per Tube Daily  . feeding supplement (PRO-STAT SUGAR FREE 64)  30 mL Per Tube TID BM  . fluconazole  100 mg Oral Daily  . free water  100 mL Per Tube 3 times per day  . hydrocerin   Topical BID  . insulin aspart  0-15 Units Subcutaneous TID PC & HS  . [START ON 01/24/2015] insulin glargine  15 Units Subcutaneous Daily  . metroNIDAZOLE  500 mg Per Tube 3 times per day  .  mupirocin ointment   Topical Daily  . nateglinide  120 mg Oral TID WC  . nystatin   Topical TID  . polycarbophil  625 mg Oral Daily  . predniSONE  60 mg Per Tube Q breakfast  . pyridostigmine  60 mg Oral 4 times per day  . rivaroxaban  20 mg Per Tube Q supper  . saccharomyces boulardii  250 mg Oral BID    Continuous Infusions:    Past Medical History  Diagnosis Date  . Atrial fibrillation, chronic     on Xarelto   . HTN (hypertension)   . CHF (congestive heart failure)   . Diabetes mellitus   . 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 Myasthenia gravis     Past Surgical History  Procedure Laterality Date  . Abdominal wall metastasis removal      Kallie Locks, MS, RD, LDN Pager # [phone] After hours/ weekend pager # [phone]

## 2015-01-23 NOTE — Progress Notes (Signed)
Inpatient [facility] Individual Statement of Services  Patient Name:  Craig [name]  Date:  01/23/2015  Welcome to the [facility].  Our goal is to provide you with an individualized program based on your diagnosis and situation, designed to meet your specific needs.  With this comprehensive rehabilitation program, you will be expected to participate in at least 3 hours of rehabilitation therapies Monday-Friday, with modified therapy programming on the weekends.  Your rehabilitation program will include the following services:  Physical Therapy (PT), Occupational Therapy (OT), Speech Therapy (ST), 24 hour per day rehabilitation nursing, Neuropsychology, Case Management (Social Worker), Rehabilitation Medicine, Nutrition Services and Pharmacy Services  Weekly team conferences will be held on Tuesdays to discuss your progress.  Your Social Worker will talk with you frequently to get your input and to update you on team discussions.  Team conferences with you and your family in attendance may also be held.  Expected length of stay:  10-12 days  Overall anticipated outcome:  Supervision  Depending on your progress and recovery, your program may change. Your Social Worker will coordinate services and will keep you informed of any changes. Your Social Worker's name and contact numbers are listed  below.  The following services may also be recommended but are not provided by the [facility] will be made to provide these services after discharge if needed.  Arrangements include referral to agencies that provide these services.  Your insurance has been verified to be:  [employer] Your primary doctor is:  Dr. Ann Held  Pertinent information will be shared with your doctor and your insurance company.  Social Worker:  Alfonse Alpers, LCSW  [phone] or ([phone]  Information discussed with and copy given to patient by: Trey Sailors, 01/23/2015, 10:40 AM

## 2015-01-23 NOTE — Evaluation (Signed)
Physical Therapy Assessment and Plan  Patient Details  Name: Craig [name] MRN: [id number] Date of Birth: 1941-06-01  PT Diagnosis: Abnormal posture, Difficulty walking, Edema, Muscle weakness and Pain in generalized Rehab Potential: Good ELOS: 10-12 days   Today's Date: 01/23/2015 PT Individual Time: 1030-1130 PT Individual Time Calculation (min): 60 min    Problem List:  Patient Active Problem List   Diagnosis Date Noted  . Physical deconditioning   . C. difficile colitis   . Obstructive sleep apnea   . Benign essential hypertension antepartum   . HLD (hyperlipidemia)   . Hypophosphatemia   . Hypokalemia   . 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 Diabetes type 2, uncontrolled   . Respiratory abnormality   . Collapse   . Acute respiratory failure   . Myasthenia 01/10/2015  . Myasthenia gravis 01/08/2015  . 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 Myasthenia gravis with acute exacerbation 01/08/2015  . MG with exacerbation (myasthenia gravis) 12/29/2014  . Protein-calorie malnutrition, severe   . Esophageal reflux   . Chest pain 12/27/2014  . Chronic atrial fibrillation 12/27/2014  . Pain in the chest   . Diaphoresis   . Bradycardia   . Dysphagia   . MG, crisis (myasthenia gravis) 12/17/2014  . Dysphagia, pharyngoesophageal phase   . Enteritis due to Clostridium difficile   . Clostridium difficile diarrhea 12/15/2014  . Hypernatremia   . Acute exacerbation of myasthenia gravis   . Myasthenia gravis in crisis 12/05/2014  . Bladder cancer 12/05/2014  . HTN (hypertension) 12/05/2014  . A-fib 12/05/2014  . Chronic anticoagulation 12/05/2014  . Acute respiratory failure with hypoxia 12/05/2014    Past Medical History:  Past Medical History  Diagnosis Date  . Atrial fibrillation, chronic     on Xarelto   . HTN (hypertension)   . CHF (congestive heart failure)   . Diabetes mellitus   . Bladder cancer metastasized to intra-abdominal  lymph nodes     abdominal wall metastases   . Myasthenia gravis    Past Surgical History:  Past Surgical History  Procedure Laterality Date  . Abdominal wall metastasis removal      Assessment & Plan Clinical Impression: Patient is a 74 y.o. year old male with recent admission to the hospital with history of AFib, bladder cancer, DM type 2, MG with recent flare who was recently discharged to home past CIR on 01/02/15. He was seen by outpatient neurologist on 02/26 with plans to start home IVIG but was readmitted on 01/10/15 with fatigue, worsening of SOB and hypoxia. HD Cath placed by CCM on 02/28 and neurology recommended IVIG X 5 treatments. CCM consulted for close monitoring of respiratory status due to concerns of requiring intubation and he was started on IV zosyn for PNA. He developed diarrhea with recurrent c diff and was started on metronidazole on 03/05. He has had neck discomfort due to HD cath requiring IV fentanyl for pain management. ST evaluation done yesterday and patient started on trials of ice chips with plans for objective swallow evaluation. He has completed his treatment course of IVIG today and is reporting increase in strength as well as swallow function. MBS done 03/08 and patient started on regular diet with thin liquids. Therapy ongoing and patient showing improvement in activity tolerance. CIR recommended by MD and Rehab team.  Patient transferred to CIR on 01/22/2015 .   Patient currently requires mod A for transfers and +2 for gait with mobility secondary to  muscle weakness and muscle joint tightness, decreased cardiorespiratoy endurance and decreased oxygen support and decreased sitting balance, decreased standing balance, decreased postural control and decreased balance strategies.  Prior to hospitalization, patient was supervision with mobility and lived with Spouse in a House home.  Home access is 3 Stairs to enter.  Patient will benefit from skilled PT  intervention to maximize safe functional mobility, minimize fall risk and decrease caregiver burden for planned discharge home with 24 hour supervision.  Anticipate patient will benefit from follow up [place] at discharge.  PT - End of Session Activity Tolerance: Decreased this session Endurance Deficit: Yes Endurance Deficit Description: c/o SOB with activity; 2L via [place] and dropped to 86% wiith actiivty. Multiple rest breaks during session due to fatigue PT Assessment Rehab Potential (ACUTE/IP ONLY): Good PT Patient demonstrates impairments in the following area(s): Balance;Edema;Endurance;Motor;Pain;Skin Integrity PT Transfers Functional Problem(s): Bed Mobility;Bed to Chair;Car;Furniture PT Locomotion Functional Problem(s): Ambulation;Stairs PT Plan PT Intensity: Minimum of 1-2 x/day ,45 to 90 minutes PT Frequency: 5 out of 7 days PT Duration Estimated Length of Stay: 10-12 days PT Treatment/Interventions: Ambulation/gait training;Balance/vestibular training;Community reintegration;Discharge planning;Disease management/prevention;DME/adaptive equipment instruction;Functional mobility training;Neuromuscular re-education;Pain management;Patient/family education;Psychosocial support;Skin care/wound management;Stair training;Therapeutic Activities;Therapeutic Exercise;UE/LE Strength taining/ROM;UE/LE Coordination activities;Wheelchair propulsion/positioning PT Transfers Anticipated Outcome(s): S to min A (car) PT Locomotion Anticipated Outcome(s): S household distances; min A stairs PT Recommendation Recommendations for Other Services: Neuropsych consult Follow Up Recommendations: Home health PT;24 hour supervision/assistance Patient destination: Home Equipment Recommended: Rolling walker with 5" wheels Equipment Details: Pt owns [facility] but likely will need more support due to endurance deficits  Skilled Therapeutic Intervention Individual treatment initiated with focus on functional transfers, gait  with RW for functional mobility training (16' with close w/c follow for +2 due to quick uncontrolled descent with cues for posture), initiation of stairs, and education/orientation to PT plan of care. Pt's wife present during session and providing support. Pt appears and verbalized frustration with being back in the hospital. Emotional support provided. Transferred to recliner end of session with RW with mod A for sit to stand and min A for transfer with cues for hand placement and technique.   PT Evaluation Precautions/Restrictions Precautions Precautions: Fall Precaution Comments: Monitor O2 Restrictions Weight Bearing Restrictions: No  Home Living/Prior Functioning Home Living Living Arrangements: Spouse/significant other Available Help at Discharge: Family;Available 24 hours/day Type of Home: House Home Access: Stairs to enter [employer] of Steps: 3 Entrance Stairs-Rails: Right Home Layout: One level;Laundry or work area in basement Additional Comments: wife reports that if he goes home, they will probably need tub bench  Lives With: Spouse Prior Function Level of Independence: Requires assistive device for independence  Able to Take Stairs?: Yes Comments: Pt recently discharged from [place] using [place] at S level.  Cognition Overall Cognitive Status: Within Functional Limits for tasks assessed Arousal/Alertness: Awake/alert Orientation Level: Oriented X4 Attention: Focused Focused Attention: Appears intact Memory: Appears intact Awareness: Appears intact Problem Solving: Appears intact Safety/Judgment: Appears intact Comments: Pt with flat affect and discouraged with readmission. Sensation Sensation Light Touch: Appears Intact Proprioception: Appears Intact Coordination Gross Motor Movements are Fluid and Coordinated: Yes Motor  Motor Motor: Abnormal postural alignment and control Motor - Skilled Clinical Observations: generalized weakness proximal > distal    Locomotion  Ambulation Ambulation/Gait Assistance: 4: Min assist;1: +2 Total assist (for close w/c follow +2)  Trunk/Postural Assessment  Cervical Assessment Cervical Assessment: Exceptions to [facility] Cervical Strength Overall Cervical Strength Comments: maintains forwards head Thoracic Assessment Thoracic Assessment: Exceptions  to [facility] Thoracic Strength Overall Thoracic Strength Comments: flexed posture Lumbar Assessment Lumbar Assessment: Exceptions to [facility] (posterior pelvic tilt) Postural Control Postural Control: Deficits on evaluation Postural Limitations: flexed posture in standing with difficulty achieving upright posture.  Balance Balance Balance Assessed: Yes Static Sitting Balance Static Sitting - Level of Assistance: 5: Stand by assistance Dynamic Sitting Balance Dynamic Sitting - Level of Assistance: 5: Stand by assistance Static Standing Balance Static Standing - Level of Assistance: 4: Min assist;3: Mod assist Dynamic Standing Balance Dynamic Standing - Level of Assistance: 3: Mod assist Extremity Assessment  RLE Assessment RLE Assessment: Exceptions to [facility] (dec muscular endurance; 3+ to 4-/5 (proximally weaker)) LLE Assessment LLE Assessment: Exceptions to [facility] (dec muscular endurance; 3+ to 4-/5 (proximally weaker))  FIM:  FIM - Control and instrumentation engineer Devices: Bed rails Bed/Chair Transfer: 4: Supine > Sit: Min A (steadying Pt. > 75%/lift 1 leg);3: Bed > Chair or W/C: Mod A (lift or lower assist) FIM - Locomotion: Wheelchair Locomotion: Wheelchair: 1: Total Assistance/staff pushes wheelchair (Pt<25%) FIM - Locomotion: Ambulation Locomotion: Ambulation Assistive Devices: Administrator Ambulation/Gait Assistance: 4: Min assist;1: +2 Total assist (for close w/c follow +2) Locomotion: Ambulation: 1: Two helpers FIM - Locomotion: Stairs Locomotion: Scientist, physiological: Insurance account manager - 2 Locomotion: Stairs: 1: Up and Down < 4 stairs  with moderate assistance (Pt: 50 - 74%)   Refer to Care Plan for Long Term Goals  Recommendations for other services: Neuropsych (coping)  Discharge Criteria: Patient will be discharged from PT if patient refuses treatment 3 consecutive times without medical reason, if treatment goals not met, if there is a change in medical status, if patient makes no progress towards goals or if patient is discharged from hospital.  The above assessment, treatment plan, treatment alternatives and goals were discussed and mutually agreed upon: by patient and by family  Allayne Gitelman 01/23/2015, 12:22 PM

## 2015-01-23 NOTE — Progress Notes (Signed)
Pt does not wish to wear CPAP/Bipap tonight. Pt encouraged to call RT if pt changes mind.

## 2015-01-23 NOTE — IPOC Note (Signed)
Overall Plan of Care [facility]) Patient Details Name: Craig [name] MRN: [id number] DOB: 22-Nov-1940  Admitting Diagnosis: MG CRISIS    Hospital Problems: Principal Problem:   MG, crisis (myasthenia gravis) Active Problems:   A-fib   Enteritis due to Clostridium difficile   Obstructive sleep apnea     Functional Problem List: Nursing Edema, Endurance, Medication Management, Motor, Pain, Skin Integrity  PT Balance, Edema, Endurance, Motor, Pain, Skin Integrity  OT Balance, Endurance, Motor, Pain, Safety  SLP    TR         Basic ADL's: OT Bathing, Dressing, Toileting     Advanced  ADL's: OT       Transfers: PT Bed Mobility, Bed to Chair, Car, Chief Operating Officer: PT Ambulation, Stairs     Additional Impairments: OT Fuctional Use of Upper Extremity  SLP Swallowing, Communication expression    TR      Anticipated Outcomes Item Anticipated Outcome  Self Feeding Supervision  Swallowing  Mod I    Basic self-care  Supervision  Toileting  Supervision   Bathroom Transfers Supervision  Bowel/Bladder  Mod I  Transfers  S to min A (car)  Locomotion  S household distances; min A stairs  Communication  Mod I   Cognition     Pain  < 3  Safety/Judgment  Supervision   Therapy Plan: PT Intensity: Minimum of 1-2 x/day ,45 to 90 minutes PT Frequency: 5 out of 7 days PT Duration Estimated Length of Stay: 10-12 days OT Intensity: Minimum of 1-2 x/day, 45 to 90 minutes OT Frequency: 5 out of 7 days OT Duration/Estimated Length of Stay: 10-12 days SLP Intensity: Minumum of 1-2 x/day, 30 to 90 minutes SLP Frequency: 1 to 3 out of 7 days SLP Duration/Estimated Length of Stay: 1 week for SLP        Team Interventions: Nursing Interventions Patient/Family Education, Medication Management, Skin Care/Wound Management, Pain Management, Dysphagia/Aspiration Precaution Training, Cognitive Remediation/Compensation, Discharge Planning  PT interventions  Ambulation/gait training, Training and development officer, Community reintegration, Discharge planning, Disease management/prevention, DME/adaptive equipment instruction, Functional mobility training, Neuromuscular re-education, Pain management, Patient/family education, Psychosocial support, Skin care/wound management, Stair training, Therapeutic Activities, Therapeutic Exercise, UE/LE Strength taining/ROM, UE/LE Coordination activities, Wheelchair propulsion/positioning  OT Interventions Training and development officer, Academic librarian, Discharge planning, Engineer, drilling, Disease mangement/prevention, Barrister's clerk education, Self Care/advanced ADL retraining, Therapeutic Exercise, UE/LE Coordination activities, UE/LE Strength taining/ROM, Visual/perceptual remediation/compensation, Therapeutic Activities, Skin care/wound managment, Functional mobility training, Psychosocial support  SLP Interventions Dysphagia/aspiration precaution training, Cueing hierarchy, Patient/family education, Therapeutic Activities, Oral motor exercises, Speech/Language facilitation  TR Interventions    SW/CM Interventions Discharge Planning, Psychosocial Support, Patient/Family Education    Team Discharge Planning: Destination: PT-Home ,OT- Home , SLP-Home Projected Follow-up: PT-Home health PT, 24 hour supervision/assistance, OT-  Home health OT, SLP- (TBD) Projected Equipment Needs: PT-Rolling walker with 5" wheels, OT- Tub/shower bench, SLP-None recommended by SLP Equipment Details: PT-Pt owns [facility] but likely will need more support due to endurance deficits, OT-  Patient/family involved in discharge planning: PT- Patient, Family Midwife,  OT-Patient, SLP-Patient, Family member/caregiver  MD ELOS: 10 days Medical Rehab Prognosis:  Excellent Assessment: The patient has been admitted for CIR therapies with the diagnosis of myasthenia gravis. The team will be addressing functional mobility,  strength, stamina, balance, safety, adaptive techniques and equipment, self-care, bowel and bladder mgt, patient and caregiver education, NMR, activity tolerance, nutrition, swallowing, speech, ego support. Goals have been set at supervision to mod I across disciplines.  Meredith Staggers, MD, FAAPMR     See Team Conference Notes for weekly updates to the plan of care

## 2015-01-24 ENCOUNTER — Inpatient Hospital Stay (HOSPITAL_COMMUNITY): Payer: PPO | Admitting: Occupational Therapy

## 2015-01-24 ENCOUNTER — Inpatient Hospital Stay (HOSPITAL_COMMUNITY): Payer: PPO

## 2015-01-24 ENCOUNTER — Inpatient Hospital Stay (HOSPITAL_COMMUNITY): Payer: PPO | Admitting: Speech Pathology

## 2015-01-24 LAB — GLUCOSE, CAPILLARY
GLUCOSE-CAPILLARY: 68 mg/dL — AB (ref 70–99)
GLUCOSE-CAPILLARY: 198 mg/dL — AB (ref 70–99)
GLUCOSE-CAPILLARY: 182 mg/dL — AB (ref 70–99)
Glucose-Capillary: 97 mg/dL (ref 70–99)
Glucose-Capillary: 93 mg/dL (ref 70–99)

## 2015-01-24 MED ORDER — BACITRACIN ZINC 500 UNIT/GM EX OINT
TOPICAL_OINTMENT | Freq: Two times a day (BID) | CUTANEOUS | Status: DC
  Administered 2015-01-24 – 2015-01-25 (×4): via TOPICAL
  Administered 2015-01-26: 1 via TOPICAL
  Administered 2015-01-26 – 2015-01-29 (×4): via TOPICAL
  Administered 2015-01-29: 1 via TOPICAL
  Administered 2015-01-30 – 2015-02-01 (×6): via TOPICAL
  Administered 2015-02-02: 1 via TOPICAL
  Administered 2015-02-02 – 2015-02-03 (×2): via TOPICAL
  Filled 2015-01-24: qty 28.35

## 2015-01-24 NOTE — Progress Notes (Signed)
Craig [name] is a 74 y.o. male 08/06/1941 [id number]  Subjective: No new complaints. No new problems. Slept well. Feeling OK. No change in chronic RLQ belly ache - unchanged with meals, BM, position or time of day  Objective: Vital signs in last 24 hours: Temp:  [97.5 F (36.4 C)-97.7 F (36.5 C)] 97.5 F (36.4 C) (03/12 0513) Pulse Rate:  [71-84] 84 (03/12 0513) Resp:  [18] 18 (03/12 0513) BP: (111-112)/(72-73) 111/72 mmHg (03/12 0513) SpO2:  [91 %-95 %] 91 % (03/12 0513) Weight:  [106.9 kg (235 lb 10.8 oz)] 106.9 kg (235 lb 10.8 oz) (03/12 0513) Weight change: -3.2 kg (-7 lb 0.9 oz) Last BM Date: 01/23/15  Intake/Output from previous day: 03/11 0701 - 03/12 0700 In: 720 [P.O.:720] Out: 1351 [Urine:1350; Stool:1]  Physical Exam General: No apparent distress   In [facility], nurse in room for meds Lungs: Normal effort. Lungs clear to auscultation, no crackles or wheezes. Cardiovascular: Regular rate and rhythm, no edema BLE Abdomen: protuberant, S, +BS, no R/G Musculoskeletal:  No gross deformities  Neurological: No new neurological deficits Wounds: N/A      Lab Results: BMET    Component Value Date/Time   NA 142 01/23/2015 0608   K 3.5 01/23/2015 0608   CL 104 01/23/2015 0608   CO2 32 01/23/2015 0608   GLUCOSE 156* 01/23/2015 0608   BUN 16 01/23/2015 0608   CREATININE 0.60 01/23/2015 0608   CALCIUM 8.4 01/23/2015 0608   GFRNONAA >90 01/23/2015 0608   GFRAA >90 01/23/2015 0608   CBC    Component Value Date/Time   WBC 9.7 01/23/2015 0608   WBC 12.8* 01/08/2015 1608   RBC 3.57* 01/23/2015 0608   RBC 4.61 01/08/2015 1608   RBC 5.06 12/13/2014 1101   HGB 10.9* 01/23/2015 0608   HCT 33.4* 01/23/2015 0608   PLT 171 01/23/2015 0608   MCV 93.6 01/23/2015 0608   MCH 30.5 01/23/2015 0608   MCH 31.2 01/08/2015 1608   MCHC 32.6 01/23/2015 0608   MCHC 33.6 01/08/2015 1608   RDW 17.2* 01/23/2015 0608   RDW 14.4 01/08/2015 1608   LYMPHSABS 1.6 01/23/2015 0608   LYMPHSABS  0.4* 01/08/2015 1608   MONOABS 0.7 01/23/2015 0608   EOSABS 0.0 01/23/2015 0608   EOSABS 0.0 01/08/2015 1608   BASOSABS 0.0 01/23/2015 0608   BASOSABS 0.0 01/08/2015 1608   CBG's (last 3):   Recent Labs  01/23/15 2054 01/24/15 0651 01/24/15 0727  GLUCAP 148* 68* 97   LFT's Lab Results  Component Value Date   ALT 15 01/23/2015   AST 17 01/23/2015   ALKPHOS 38* 01/23/2015   BILITOT 0.6 01/23/2015   Lab Results  Component Value Date   HGBA1C 6.9* 12/28/2014    Studies/Results: No results found.  Medications:  I have reviewed the patient's current medications. Scheduled Medications: . amiodarone  100 mg Per Tube Daily  . bacitracin   Topical BID  . famotidine  40 mg Per Tube Daily  . free water  100 mL Per Tube TID BM  . hydrocerin   Topical BID  . insulin aspart  0-15 Units Subcutaneous TID PC & HS  . insulin glargine  15 Units Subcutaneous Daily  . metroNIDAZOLE  500 mg Per Tube 3 times per day  . mupirocin ointment   Topical Daily  . nateglinide  120 mg Oral TID WC  . nystatin   Topical TID  . polycarbophil  625 mg Oral Daily  . predniSONE  60 mg Per  Tube Q breakfast  . pyridostigmine  60 mg Oral 4 times per day  . rivaroxaban  20 mg Per Tube Q supper  . saccharomyces boulardii  250 mg Oral BID   PRN Medications: acetaminophen, albuterol, alum & mag hydroxide-simeth, chlorpheniramine-HYDROcodone, diphenhydrAMINE, guaiFENesin-dextromethorphan, ondansetron **OR** ondansetron (ZOFRAN) IV, traZODone  Assessment/Plan: Principal Problem:   MG, crisis (myasthenia gravis) Active Problems:   A-fib   Enteritis due to Clostridium difficile   Obstructive sleep apnea  1. Functional deficits secondary to MG crisis- continue support and rehab as ongoing 2. DVT Prophylaxis/Anticoagulation: Pharmaceutical: Xarelto 3. Pain Management: Tylenol prn.  4. Mood: LCSW to follow for support. 5. Neuropsych: This patient is capable of making decisions on her own behalf. 6.  Skin/Wound Care: Routine pressure relief measures. Added nystatin powder to help with moisture/yeast in periarea.  7. Fluids/Electrolytes/Nutrition: eating little. changed tube feeds to nighttime to help with appetite during the day. Added water flushes.  8. DM type 2: Prior to admit, on oral meds at home with good control per a1c. resumed Starlix and tapering lantus. Monitor BS with ac/hs checks. SSI for elevated BS, exacerbated by pred -fair control at present 12. A fib: Monitor heart rate every 8 hours. Continue amiodarone and Xarelto 13. 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 Continue flagyl  -probiotic/fiber -transition to oral diet should help also   -enteric precautions continue  Length of stay, days: 2   Symphany Fleissner A. Asa Lente, MD 01/24/2015, 11:02 AM

## 2015-01-24 NOTE — Progress Notes (Signed)
Hypoglycemic Event  CBG: 68  Treatment: 15 GM carbohydrate snack  Symptoms: None   Follow-up CBG: Time:0727 CBG Result:97  Possible Reasons for Event: Unknown  Comments/MD notified: Pt stable, will cont to monitor    Craig [name], Dione Plover  Remember to initiate Hypoglycemia Order Set & complete

## 2015-01-24 NOTE — Progress Notes (Signed)
Physical Therapy Session Note  Patient Details  Name: Craig [name] MRN: [id number] Date of Birth: [birth year]  Today's Date: 01/24/2015 PT Individual Time: 2836-6294 PT Individual Time Calculation (min): 60 min   Short Term Goals: Week 1:  PT Short Term Goal 1 (Week 1): Pt will be able to perform bed mobility with min A PT Short Term Goal 2 (Week 1): Pt will be able to perform functional transfers consistently with min A PT Short Term Goal 3 (Week 1): Pt will be able to gait x 25' with min A with LRAD PT Short Term Goal 4 (Week 1): Pt will be able to go up/down 3 steps with R rail for home entry with mod A  Skilled Therapeutic Interventions/Progress Updates:   Session focused on functional strengthening and endurance for w/c propulsion down to therapy gym with S (BUE and BLE), gait training with RW, and stair negotiation. Pt demonstrating improved sit to stands today with overall min A with cues for hand placement and technique. Gait x 15', x 40', x 45', x 45' with min A overall with close w/c follow due to need to sit suddenly though improved from yesterday with cues for upright posture and staying inside RW. Stair negotiation with mod to max A up/down 3 steps (6") with cues for technique and increased physical A needed due to taller step height. Initially on 2L O2 via [place] during session but removed for room air trials with gait and maintained > 95%. Notified NT as returned to room and left on room air. Pt's wife present during session and remained at bedside at end of session.  Therapy Documentation Precautions:  Precautions Precautions: Fall Precaution Comments: Monitor O2 Restrictions Weight Bearing Restrictions: No   Pain:  9/10 pain in abdomen - premedicated.  See FIM for current functional status  Therapy/Group: Individual Therapy  [name] [facility] 01/24/2015, 10:06 AM

## 2015-01-24 NOTE — Progress Notes (Signed)
Occupational Therapy Session Note  Patient Details  Name: Craig [name] MRN: [id number] Date of Birth: 1941/04/17  Today's Date: 01/24/2015 OT Individual Time: 7846-9629 OT Individual Time Calculation (min): 60 min    Short Term Goals: Week 1:  OT Short Term Goal 1 (Week 1): Pt will complete LB dressing sitting EOB with min A OT Short Term Goal 2 (Week 1): Pt will complete UB dressing with set-up OT Short Term Goal 3 (Week 1): Pt will complete toilet transfers with min A OT Short Term Goal 4 (Week 1): Pt will complete grooming task seated at the sink with set-up  Skilled Therapeutic Interventions/Progress Updates:  Upon entering the room, pt supine in bed with 3/10 c/o pain in abdomen. Pt agreeable to participate this session. Supine >sit with Min A to EOB. Pt engaging in UB bathing and dressing while seated EOB. Pt having having BM in depend during session but unaware. Therapist transferred pt with Mod A squat pivot into wheelchair and propelled him into bathroom. Max A transfer onto toilet with use of grab bars as needed. Pt requiring assistance with clothing management and hygiene after BM. LB dressing completed while pt seated on wheelchair. Pt seated at sink side for grooming tasks after toileting episode. Therapist set up breakfast tray before exiting the room. Call bell and all other needed items within reach upon exiting the room.   Therapy Documentation Precautions:  Precautions Precautions: Fall Precaution Comments: Monitor O2 Restrictions Weight Bearing Restrictions: No  See FIM for current functional status  Therapy/Group: Individual Therapy  Phineas Semen 01/24/2015, 10:25 AM

## 2015-01-24 NOTE — Progress Notes (Signed)
Speech Language Pathology Daily Session Note  Patient Details  Name: Craig [name] MRN: [id number] Date of Birth: 04-10-1941  Today's Date: 01/24/2015 SLP Individual Time: 1000-1100 SLP Individual Time Calculation (min): 60 min  Short Term Goals: Week 1: SLP Short Term Goal 1 (Week 1): Patient will consume current diet without overt s/s of aspiration with Mod I for use of swallowing compensatory strategies.  SLP Short Term Goal 2 (Week 1): Patient will utilize over articulation and an increased vocal intensity to maximize speech intelligibility at the sentence level to 100% accuracy with Mod I.   Skilled Therapeutic Interventions: Skilled treatment session focused on speech and dysphagia goals. Upon arrival, patient was awake while sitting upright in recliner and declined trials of textures and liquids. SLP facilitated session by providing supervision verbal cues for use of over-articulation and a slow rate during structured word description task at the phrase level to increase speech intelligibility to 90% accuracy. Patient continues to have a flat affect and requires encouragement to engage in functional conversation and participate in decision making in regards to care. Patient's wife present and attempted to encourage patient as well, especially in regards to choosing meals to increase overall PO intake. Patient left in recliner with wife present. Continue with current plan of care.    FIM:  Comprehension Comprehension Mode: Auditory Comprehension: 6-Follows complex conversation/direction: With extra time/assistive device Expression Expression Mode: Verbal Expression: 5-Expresses complex 90% of the time/cues < 10% of the time Social Interaction Social Interaction: 4-Interacts appropriately 75 - 89% of the time - Needs redirection for appropriate language or to initiate interaction. Problem Solving Problem Solving: 6-Solves complex problems: With extra time Memory Memory: 5-Recognizes or  recalls 90% of the time/requires cueing < 10% of the time  Pain Pain Assessment Pain Assessment: No/denies pain  Therapy/Group: Individual Therapy  Kathlen Sakurai 01/24/2015, 12:03 PM

## 2015-01-24 NOTE — Progress Notes (Signed)
Pt does not want to wear CPAP tonight. Made pt aware that if he changed his mind to call RT.

## 2015-01-25 ENCOUNTER — Inpatient Hospital Stay (HOSPITAL_COMMUNITY): Payer: PPO | Admitting: Physical Therapy

## 2015-01-25 LAB — GLUCOSE, CAPILLARY
GLUCOSE-CAPILLARY: 173 mg/dL — AB (ref 70–99)
Glucose-Capillary: 90 mg/dL (ref 70–99)
Glucose-Capillary: 94 mg/dL (ref 70–99)
Glucose-Capillary: 193 mg/dL — ABNORMAL HIGH (ref 70–99)

## 2015-01-25 MED ORDER — TRAMADOL HCL 50 MG PO TABS
50.0000 mg | ORAL_TABLET | Freq: Four times a day (QID) | ORAL | Status: DC | PRN
  Administered 2015-01-25 – 2015-02-02 (×23): 50 mg via ORAL
  Filled 2015-01-25 (×26): qty 1

## 2015-01-25 MED ORDER — HEPARIN SOD (PORK) LOCK FLUSH 100 UNIT/ML IV SOLN
500.0000 [IU] | INTRAVENOUS | Status: DC
  Administered 2015-01-25: 500 [IU]

## 2015-01-25 MED ORDER — HEPARIN SOD (PORK) LOCK FLUSH 100 UNIT/ML IV SOLN
500.0000 [IU] | INTRAVENOUS | Status: DC | PRN
  Filled 2015-01-25: qty 5

## 2015-01-25 NOTE — Progress Notes (Signed)
Physical Therapy Session Note  Patient Details  Name: Craig [name] MRN: [id number] Date of Birth: 1941/11/04  Today's Date: 01/25/2015 PT Individual Time: 2902-1115 PT Individual Time Calculation (min): 45 min   Short Term Goals: Week 1:  PT Short Term Goal 1 (Week 1): Pt will be able to perform bed mobility with min A PT Short Term Goal 2 (Week 1): Pt will be able to perform functional transfers consistently with min A PT Short Term Goal 3 (Week 1): Pt will be able to gait x 25' with min A with LRAD PT Short Term Goal 4 (Week 1): Pt will be able to go up/down 3 steps with R rail for home entry with mod A  Skilled Therapeutic Interventions/Progress Updates:  Pt was seen bedside in the am. Pt rolled R/L with side rail and S to assist with hygiene. Pt transferred supine to edge of bed with side rail and S with increased time. Pt transferred edge of bed to w/c with min A and verbal cues. Pt propelled w/c about 175 feet with B UEs and S with occasional rest breaks. Pt performed multiple transfers in gym with rolling walker and min guard to min A. Pt ambulated with rolling walker and min guard to min A for 70 and 64 feet. Pt performed step ups for LE strengthening. Pt returned to room. Pt transferred w/c to edge of bed with min A and verbal cues. Pt transferred edge of bed to supine with side rail and S.  Pt moved up in bed with bed controls, S and verbal cues.   Therapy Documentation Precautions:  Precautions Precautions: Fall Precaution Comments: Monitor O2 Restrictions Weight Bearing Restrictions: No General:   Pain: No c/o pain.    Locomotion : Ambulation Ambulation/Gait Assistance: 4: Min assist;4: Min guard   See FIM for current functional status  Therapy/Group: Individual Therapy  [name] 01/25/2015, 12:38 PM

## 2015-01-25 NOTE — Progress Notes (Signed)
Craig [name] is a 74 y.o. male 1941-05-11 [id number]  Subjective: No new problems. Slept well. Feeling OK. Continued and unchanged chronic RLQ belly ache - unchanged with meals, BM, position or time of day. Not improved with Tylenol  Objective: Vital signs in last 24 hours: Temp:  [97.6 F (36.4 C)-97.8 F (36.6 C)] 97.6 F (36.4 C) (03/13 0553) Pulse Rate:  [64-69] 64 (03/13 0553) Resp:  [16-17] 16 (03/13 0553) BP: (109-118)/(65-71) 109/71 mmHg (03/13 0553) SpO2:  [94 %-95 %] 94 % (03/13 0553) Weight:  [105.1 kg (231 lb 11.3 oz)] 105.1 kg (231 lb 11.3 oz) (03/13 0553) Weight change: -1.8 kg (-3 lb 15.5 oz) Last BM Date: 01/22/15  Intake/Output from previous day: 03/12 0701 - 03/13 0700 In: 840 [P.O.:840] Out: 1150 [Urine:1150]  Physical Exam General: No apparent distress   In [facility] Lungs: Normal effort. Lungs clear to auscultation, no crackles or wheezes. Cardiovascular: Regular rate and rhythm, no edema BLE Abdomen: protuberant, S, +BS, no R/G Musculoskeletal:  No gross deformities  Neurological: No new neurological deficits Wounds: N/A      Lab Results: BMET    Component Value Date/Time   NA 142 01/23/2015 0608   K 3.5 01/23/2015 0608   CL 104 01/23/2015 0608   CO2 32 01/23/2015 0608   GLUCOSE 156* 01/23/2015 0608   BUN 16 01/23/2015 0608   CREATININE 0.60 01/23/2015 0608   CALCIUM 8.4 01/23/2015 0608   GFRNONAA >90 01/23/2015 0608   GFRAA >90 01/23/2015 0608   CBC    Component Value Date/Time   WBC 9.7 01/23/2015 0608   WBC 12.8* 01/08/2015 1608   RBC 3.57* 01/23/2015 0608   RBC 4.61 01/08/2015 1608   RBC 5.06 12/13/2014 1101   HGB 10.9* 01/23/2015 0608   HCT 33.4* 01/23/2015 0608   PLT 171 01/23/2015 0608   MCV 93.6 01/23/2015 0608   MCH 30.5 01/23/2015 0608   MCH 31.2 01/08/2015 1608   MCHC 32.6 01/23/2015 0608   MCHC 33.6 01/08/2015 1608   RDW 17.2* 01/23/2015 0608   RDW 14.4 01/08/2015 1608   LYMPHSABS 1.6 01/23/2015 0608   LYMPHSABS 0.4*  01/08/2015 1608   MONOABS 0.7 01/23/2015 0608   EOSABS 0.0 01/23/2015 0608   EOSABS 0.0 01/08/2015 1608   BASOSABS 0.0 01/23/2015 0608   BASOSABS 0.0 01/08/2015 1608   CBG's (last 3):    Recent Labs  01/24/15 1656 01/24/15 2057 01/25/15 0618  GLUCAP 198* 182* 90   LFT's Lab Results  Component Value Date   ALT 15 01/23/2015   AST 17 01/23/2015   ALKPHOS 38* 01/23/2015   BILITOT 0.6 01/23/2015   Lab Results  Component Value Date   HGBA1C 6.9* 12/28/2014    Studies/Results: No results found.  Medications:  I have reviewed the patient's current medications. Scheduled Medications: . amiodarone  100 mg Per Tube Daily  . bacitracin   Topical BID  . famotidine  40 mg Per Tube Daily  . free water  100 mL Per Tube TID BM  . heparin lock flush  500 Units Intracatheter Q30 days  . hydrocerin   Topical BID  . insulin aspart  0-15 Units Subcutaneous TID PC & HS  . insulin glargine  15 Units Subcutaneous Daily  . metroNIDAZOLE  500 mg Per Tube 3 times per day  . mupirocin ointment   Topical Daily  . nateglinide  120 mg Oral TID WC  . nystatin   Topical TID  . polycarbophil  625 mg Oral Daily  .  predniSONE  60 mg Per Tube Q breakfast  . pyridostigmine  60 mg Oral 4 times per day  . rivaroxaban  20 mg Per Tube Q supper  . saccharomyces boulardii  250 mg Oral BID   PRN Medications: acetaminophen, albuterol, alum & mag hydroxide-simeth, chlorpheniramine-HYDROcodone, diphenhydrAMINE, guaiFENesin-dextromethorphan, heparin lock flush **AND** heparin lock flush, ondansetron **OR** ondansetron (ZOFRAN) IV, traZODone  Assessment/Plan: Principal Problem:   MG, crisis (myasthenia gravis) Active Problems:   A-fib   Enteritis due to Clostridium difficile   Obstructive sleep apnea  1. Functional deficits secondary to MG crisis- continue support and rehab as ongoing 2. DVT Prophylaxis/Anticoagulation: Pharmaceutical: Xarelto 3. Pain Management: Tylenol prn. Chronic RLQ pain -  ?related to peritoneal mets from bladder ca? Add tramadol prn 4. Mood: LCSW to follow for support. 5. Neuropsych: This patient is capable of making decisions on her own behalf. 6. Skin/Wound Care: Routine pressure relief measures. Added nystatin powder to help with moisture/yeast in periarea.  7. Fluids/Electrolytes/Nutrition: eating little. changed tube feeds to nighttime to help with appetite during the day. Added water flushes.  8. DM type 2: Prior to admit, on oral meds at home with good control per a1c. resumed Starlix and tapering lantus. Monitor BS with ac/hs checks. SSI for elevated BS, exacerbated by pred -fair control at present 12. A fib: Monitor heart rate every 8 hours. Continue amiodarone and Xarelto 13. 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 Continue flagyl  -probiotic/fiber -transition to oral diet should help also   -enteric precautions continue  Length of stay, days: 3   Valerie A. Asa Lente, MD 01/25/2015, 9:58 AM

## 2015-01-26 ENCOUNTER — Inpatient Hospital Stay (HOSPITAL_COMMUNITY): Payer: PPO

## 2015-01-26 ENCOUNTER — Ambulatory Visit: Payer: PPO | Admitting: Neurology

## 2015-01-26 ENCOUNTER — Inpatient Hospital Stay (HOSPITAL_COMMUNITY): Payer: PPO | Admitting: Speech Pathology

## 2015-01-26 LAB — GLUCOSE, CAPILLARY
GLUCOSE-CAPILLARY: 137 mg/dL — AB (ref 70–99)
Glucose-Capillary: 72 mg/dL (ref 70–99)
Glucose-Capillary: 89 mg/dL (ref 70–99)
Glucose-Capillary: 226 mg/dL — ABNORMAL HIGH (ref 70–99)
Glucose-Capillary: 172 mg/dL — ABNORMAL HIGH (ref 70–99)

## 2015-01-26 MED ORDER — FREE WATER
50.0000 mL | Freq: Three times a day (TID) | Status: DC
  Administered 2015-01-26 – 2015-01-28 (×7): 50 mL

## 2015-01-26 MED ORDER — SODIUM CHLORIDE 0.9 % IJ SOLN
10.0000 mL | INTRAMUSCULAR | Status: DC | PRN
  Administered 2015-01-26 – 2015-01-28 (×2): 10 mL
  Filled 2015-01-26 (×2): qty 40

## 2015-01-26 NOTE — Progress Notes (Signed)
Speech Language Pathology Daily Session Note  Patient Details  Name: Craig [name] MRN: [id number] Date of Birth: 1941-04-02  Today's Date: 01/26/2015 SLP Individual Time: 1330-1430 SLP Individual Time Calculation (min): 60 min  Short Term Goals: Week 1: SLP Short Term Goal 1 (Week 1): Patient will consume current diet without overt s/s of aspiration with Mod I for use of swallowing compensatory strategies.  SLP Short Term Goal 2 (Week 1): Patient will utilize over articulation and an increased vocal intensity to maximize speech intelligibility at the sentence level to 100% accuracy with Mod I.   Skilled Therapeutic Interventions: Skilled treatment session focused on speech goals. Upon arrival, patient was awake and upright in recliner and agreeable to participate in therapy. Student facilitated session by providing Supervision A multimodal cues for use of over-articulation during functional conversation about patient's family, interests, and hobbies to increase patient's overall speech intelligibility of 90% at the phrase level in a quiet environment. Patient was able to demonstrate previously taught diaphragmatic breathing technique with Mod I, and required Min A multimodal cues to utilize diaphragmatic breathing in conversation. Patient with markedly more socially appropriate affect today with regards to engagement in therapy and conversation. Patient left in recliner with wife present. Continue with current plan of care.    FIM:  Comprehension Comprehension Mode: Auditory Comprehension: 6-Follows complex conversation/direction: With extra time/assistive device Expression Expression Mode: Verbal Expression: 5-Expresses complex 90% of the time/cues < 10% of the time Social Interaction Social Interaction: 5-Interacts appropriately 90% of the time - Needs monitoring or encouragement for participation or interaction. Problem Solving Problem Solving: 6-Solves complex problems: With extra  time Memory Memory: 5-Recognizes or recalls 90% of the time/requires cueing < 10% of the time  Pain Pain Assessment Pain Assessment: No/denies pain  Therapy/Group: Individual Therapy  [name] 01/26/2015, 3:37 PM

## 2015-01-26 NOTE — Progress Notes (Signed)
Occupational Therapy Session Note  Patient Details  Name: Craig [name] MRN: [id number] Date of Birth: [birth year]  Today's Date: 01/26/2015 OT Individual Time: 1000-1100 OT Individual Time Calculation (min): 60 min    Short Term Goals: Week 1:  OT Short Term Goal 1 (Week 1): Pt will complete LB dressing sitting EOB with min A OT Short Term Goal 2 (Week 1): Pt will complete UB dressing with set-up OT Short Term Goal 3 (Week 1): Pt will complete toilet transfers with min A OT Short Term Goal 4 (Week 1): Pt will complete grooming task seated at the sink with set-up  Skilled Therapeutic Interventions/Progress Updates:    Pt seen for OT bathing and dressing session. Pt sitting up in recliner upon arrival, caregiver present. Pt ambulated throughout room with RW and supervision- min A for steadying. Pt completed UB and LB bathing and dressing seated in w/c. Pt bathed UB with VCs, and required min A for donning shirt to pull head through shirt hole due to decreased AROM in B UEs. He bathed LB seated in w/c, able to bend down to wash feet and don/doff B socks. Pt required min A to thread L LE for assist with clothing management. Pt stood with steadying assist to pull pants up. Pt was seated to brush hair, requiring min A to reach back of head due to decreased ROM. Pt ambulated to bathroom and completed toilet transfers with steadying assist. Hygiene not completed, and pt required min A to pull pants up. He returned to recliner following bathing and dressing. Pt completed B UE thera-band ROM and strengthening exercises. Exercises completed in all joints 1 set x 10 reps in B UEs.     Pt left in recliner at end of session, all needs in reach, and caregiver present.  Pt required increased time, rest breaks, and encouragement throughout session. However, he demonstrates increased functional mobility and level of independence with ADLs compared to session 3 days ago when this therapist last worked with him.    Pt  and caregiver educated regarding role of OT, POC, therapy goals, important of independence with ADLs, benefits of OOB, UE theraband exercises, and d/c planning.    Therapy Documentation Precautions:  Precautions Precautions: Fall Precaution Comments: Monitor O2 Restrictions Weight Bearing Restrictions: No Pain: Pain Assessment Pain Assessment: No/denies pain  See FIM for current functional status  Therapy/Group: Individual Therapy  Rayquon, Gabryelle Whitmoyer C 01/26/2015, 11:09 AM

## 2015-01-26 NOTE — Plan of Care (Signed)
Problem: SCI BLADDER ELIMINATION Goal: RH STG MANAGE BLADDER WITH ASSISTANCE STG Manage Bladder With Total Assistance  Outcome: Progressing Condom [email or web address]@[email or web address] 

## 2015-01-26 NOTE — Progress Notes (Signed)
Recreational Therapy Session Note  Patient Details  Name: Cleland Simkins MRN: [id number] Date of Birth: 02/13/1941 Today's Date: 01/26/2015  Order received & chart reviewed.  Familiar with pt from admission.  Pt placed on HOLD due to low activity tolerance.  Will continue monitor through team for future participation. [place] 01/26/2015, 4:10 PM

## 2015-01-26 NOTE — Progress Notes (Signed)
Physical Therapy Session Note  Patient Details  Name: Craig [name] MRN: [id number] Date of Birth: [birth year]  Today's Date: 01/26/2015 PT Individual Time: 0800-0900 PT Individual Time Calculation (min): 60 min   Short Term Goals: Week 1:  PT Short Term Goal 1 (Week 1): Pt will be able to perform bed mobility with min A PT Short Term Goal 2 (Week 1): Pt will be able to perform functional transfers consistently with min A PT Short Term Goal 3 (Week 1): Pt will be able to gait x 25' with min A with LRAD PT Short Term Goal 4 (Week 1): Pt will be able to go up/down 3 steps with R rail for home entry with mod A  Skilled Therapeutic Interventions/Progress Updates:   Session focused on functional bed mobility, transfers with RW, standing tolerance/balance to perform grooming at the sink, w/c propulsion for general strength and endurance, gait trials with RW while assessing gait velocity to determine fall risk, stair training in preparation for home entry, and fam education with pt's wife for sit to stands to aid with pressure relief, circulation, and strengthening in the room between therapy sessions. Pt requires overall min A today for mobility including transfers and gait with RW. Gait velocity trials for 66m walk test using RW first trial = 20 seconds (1.64 ft/sec), second trial = 20 seconds (1.64 ft/sec) and third trial = 23 seconds (1.42 ft/sec). These results indicate pt is at risk for falls. Alternating toe taps on 6" step with BUE support to prepare for stair training x 10 reps each with min/mod A. Practiced up/down 6" step x 3 reps with BUE support with mod to max A needed. Pt's wife asking about possibility to be training for sit to stands in the room from recliner to help with pt getting stiff sitting up in between session. Educated on benefits including strengthening, circulation, and pressure relief and pt's wife able to safely demonstrate at min A level from recliner using RW. Wrote this on  safety plan and instructed if he had difficulty, for her not to do this and call nursing staff. Pt and wife both verbalized understanding and in agreement.   Therapy Documentation Precautions:  Precautions Precautions: Fall Precaution Comments: Monitor O2 Restrictions Weight Bearing Restrictions: No Pain: Pain Assessment Pain Assessment: No/denies pain  See FIM for current functional status  Therapy/Group: Individual Therapy  [name] [facility] 01/26/2015, 11:07 AM

## 2015-01-26 NOTE — Progress Notes (Signed)
[place] PHYSICAL MEDICINE & REHABILITATION     PROGRESS NOTE    Subjective/Complaints: Denies pain or stomach pain. Good appetite. Stools less frequent  Objective: Vital Signs: Blood pressure 122/78, pulse 64, temperature 97.6 F (36.4 C), temperature source Oral, resp. rate 16, weight 104.6 kg (230 lb 9.6 oz), SpO2 91 %. No results found. No results for input(s): WBC, HGB, HCT, PLT in the last 72 hours. No results for input(s): NA, K, CL, GLUCOSE, BUN, CREATININE, CALCIUM in the last 72 hours.  Invalid input(s): CO CBG (last 3)   Recent Labs  01/25/15 1716 01/25/15 2043 01/26/15 0658  GLUCAP 193* 173* 72    Wt Readings from Last 3 Encounters:  01/26/15 104.6 kg (230 lb 9.6 oz)  01/21/15 107.6 kg (237 lb 3.4 oz)  01/08/15 99.791 kg (220 lb)    Physical Exam:   General: Alert and oriented x 3, No apparent distress HEENT: Head is normocephalic, atraumatic, PERRLA, EOMI, sclera anicteric, oral mucosa pink and moist, dentition intact, ext ear canals clear,  Neck: Supple without JVD or lymphadenopathy Heart: Reg rate and rhythm. No murmurs rubs or gallops Chest: CTA bilaterally without wheezes, rales, or rhonchi; no distress Abdomen: Soft, non-tender, non-distended, bowel sounds positive. Extremities: No clubbing, cyanosis, or edema. Pulses are 2+ Skin: breakdown over bridge of nose healing. Scrotum and inguinal area less reddened. Stasis changes bilateral LE's.  Neuro: Pt is cognitively appropriate reasonable insight, memory,  and awareness. Minimal ptosis. Improved oral-motor control speech much clearer. Phonation better. Gag stronger.  Sensory exam is normal. Reflexes are 2+ in all 4's. Fine motor coordination is intact. No tremors. Motor function is grossly 4/5 uppers and 3+ to 4/5 lowers. Improved trunk control.  Musculoskeletal: Full ROM, No pain with AROM or PROM in the neck, trunk, or extremities. Posture appropriate Psych: Pt's affect is appropriate. Pt is  cooperative    Assessment/Plan: 1. Functional deficits secondary to myasthenia gravis with crisis which require 3+ hours per day of interdisciplinary therapy in a comprehensive inpatient rehab setting. Physiatrist is providing close team supervision and 24 hour management of active medical problems listed below. Physiatrist and rehab team continue to assess barriers to discharge/monitor patient progress toward functional and medical goals. FIM: FIM - Bathing Bathing Steps Patient Completed: Chest, Right Arm, Left Arm, Abdomen, Right upper leg, Left upper leg Bathing: 3: Mod-Patient completes 5-7 26f 10 parts or 50-74%  FIM - Upper Body Dressing/Undressing Upper body dressing/undressing steps patient completed: Thread/unthread right sleeve of pullover shirt/dresss, Thread/unthread left sleeve of pullover shirt/dress, Put head through opening of pull over shirt/dress, Pull shirt over trunk Upper body dressing/undressing: 5: Set-up assist to: Obtain clothing/put away FIM - Lower Body Dressing/Undressing Lower body dressing/undressing steps patient completed: Thread/unthread right underwear leg, Pull underwear up/down, Thread/unthread right pants leg, Pull pants up/down Lower body dressing/undressing: 2: Max-Patient completed 25-49% of tasks  FIM - Toileting Toileting steps completed by patient: Adjust clothing prior to toileting, Adjust clothing after toileting Toileting Assistive Devices: Grab bar or rail for support Toileting: 1: Total-Patient completed zero steps, helper did all 3  FIM - Radio producer Devices: Product manager Transfers: 3-To toilet/BSC: Mod A (lift or lower assist), 3-From toilet/BSC: Mod A (lift or lower assist)  FIM - Control and instrumentation engineer Devices: Bed rails, Arm rests Bed/Chair Transfer: 5: Supine > Sit: Supervision (verbal cues/safety issues), 5: Sit > Supine: Supervision (verbal cues/safety issues), 4: Chair  or W/C > Bed: Min A (steadying Pt. >  75%), 4: Bed > Chair or W/C: Min A (steadying Pt. > 75%)  FIM - Locomotion: Wheelchair Locomotion: Wheelchair: 5: Travels 150 ft or more: maneuvers on rugs and over door sills with supervision, cueing or coaxing FIM - Locomotion: Ambulation Locomotion: Ambulation Assistive Devices: Administrator Ambulation/Gait Assistance: 4: Min assist, 4: Min guard Locomotion: Ambulation: 2: Travels 50 - 149 ft with minimal assistance (Pt.>75%)  Comprehension Comprehension Mode: Auditory Comprehension: 6-Follows complex conversation/direction: With extra time/assistive device  Expression Expression Mode: Verbal Expression: 5-Expresses complex 90% of the time/cues < 10% of the time  Social Interaction Social Interaction: 4-Interacts appropriately 75 - 89% of the time - Needs redirection for appropriate language or to initiate interaction.  Problem Solving Problem Solving: 6-Solves complex problems: With extra time  Memory Memory: 5-Recognizes or recalls 90% of the time/requires cueing < 10% of the time  Medical Problem List and Plan: 1. Functional deficits secondary to MG crisis 2. DVT Prophylaxis/Anticoagulation: Pharmaceutical: Xarelto 3. Pain Management: Tylenol prn.  4. Mood: LCSW to follow for support. 5. Neuropsych: This patient is capable of making decisions on her own behalf. 6. Skin/Wound Care: Routine pressure relief measures. Added nystatin powder to help with moisture/yeast in periarea.  7. Fluids/Electrolytes/Nutrition: eating little. changed tube feeds to nighttime to help with appetite during the day. Added water flushes.  8. DM type 2: Was on oral meds at home with good control. resumed Starlix and tapering lantus. May resume additional oral agent. Monitor BS with ac/hs checks. SSI for elevated BS.  -fair control at present 12. A fib: Monitor heart rate every 8 hours. Continue amiodarone and Xarelto 13. 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

## 2015-01-26 NOTE — Consult Note (Signed)
  INITIAL DIAGNOSTIC EVALUATION - [facility]   MEDICAL NECESSITY:  Craig [name] was seen on the [place] [facility] for an initial diagnostic evaluation owing to the patient's diagnosis of myasthenia gravis.   According to medical records, Craig [name] was admitted to the rehab unit owing to "Functional deficits secondary to MG crisis." Records also indicate that he is a "74 y.o. male with history of AFib, bladder cancer, DM type 2, MG with recent flare who was recently discharged to home past CIR on 01/02/15. He was seen by outpatient neurologist on 02/26 with plans to start home IVIG but was readmitted on 01/10/15 with fatigue, worsening of SOB and hypoxia."   Craig [name] was seen by Dr. Beverly Gust (neuropsychology) during his last rehab admission. Pertinent information from that visit is as follows:   Craig [name]'s brief neurocognitive screening was not indicative of marked cognitive disruption, at the level of dementia.  However, if he develops cognitive difficulties or concerns about his cognitive functioning, a comprehensive neuropsychological evaluation as an outpatient could be conducted.  From an emotional standpoint, Craig [name] did not endorse symptoms that would suggest the presence of clinically significant mood disruption.  Risks for development of depression post-discharge were discussed with Craig [name] and his wife and he agreed to inform his care team should he notice symptoms of clinical depression.   During today's visit, Craig [name] was accompanied by his wife who assisted with the history. There were no concerns for cognitive symptoms. He described his current mood as "fine" and has no history of mental health issues or treatment. His wife, in fact, thinks he is doing even better since he has been able to eat rather than being fed via a tube. No adjustment issues endorsed. No barriers to therapy identified. Suicidal/homicidal  ideation, plan or intent was denied. No manic or hypomanic episodes were reported. The patient denied ever experiencing any auditory/visual hallucinations. No major behavioral or personality changes were endorsed.   PROCEDURES ADMINISTERED: [1 unit 44034] Diagnostic clinical interview  Review of available records  Behavioral Evaluation: Craig [name] was appropriately dressed for season and situation, and he appeared tidy and well-groomed. Normal posture was noted. He was quiet and did not spontaneously generate a lot of conversation, though rapport was adequately established. His speech was as expected and he was able to express ideas effectively. His affect was somewhat blunted. Attention and motivation were adequate.    IMPRESSION: Craig [name] denied suffering from any cognitive difficulties or adjustment problems. He has ample support via his wife, family and friends. He affectively was rather flat and he appeared somewhat depressed, though he subjectively denied any depressive symptomatology.  At this time I plan to follow-up with him throughout his admission for brief supportive counseling. This will be scheduled for next Monday. Neuropsychology can see him sooner if deemed warranted.     Rutha Bouchard, Psy.D.  Clinical Neuropsychologist

## 2015-01-26 NOTE — Plan of Care (Signed)
Problem: SCI BOWEL ELIMINATION Goal: RH STG MANAGE BOWEL WITH ASSISTANCE STG Manage Bowel with mod I Assistance.  Outcome: Not Progressing Pt can be incontinent, per report

## 2015-01-27 ENCOUNTER — Inpatient Hospital Stay (HOSPITAL_COMMUNITY): Payer: PPO

## 2015-01-27 ENCOUNTER — Inpatient Hospital Stay (HOSPITAL_COMMUNITY): Payer: PPO | Admitting: Occupational Therapy

## 2015-01-27 LAB — GLUCOSE, CAPILLARY
GLUCOSE-CAPILLARY: 88 mg/dL (ref 70–99)
Glucose-Capillary: 79 mg/dL (ref 70–99)
Glucose-Capillary: 236 mg/dL — ABNORMAL HIGH (ref 70–99)
Glucose-Capillary: 259 mg/dL — ABNORMAL HIGH (ref 70–99)

## 2015-01-27 NOTE — Plan of Care (Signed)
Problem: SCI BLADDER ELIMINATION Goal: RH STG MANAGE BLADDER WITH ASSISTANCE STG Manage Bladder With Total Assistance  Outcome: Not Progressing Wears condom cath. @ night

## 2015-01-27 NOTE — Progress Notes (Signed)
Occupational Therapy Session Note  Patient Details  Name: Brason Berthelot MRN: [id number] Date of Birth: [birth year]  Today's Date: 01/27/2015 OT Individual Time: 0930-1030 OT Individual Time Calculation (min): 60 min    Short Term Goals: Week 1:  OT Short Term Goal 1 (Week 1): Pt will complete LB dressing sitting EOB with min A OT Short Term Goal 2 (Week 1): Pt will complete UB dressing with set-up OT Short Term Goal 3 (Week 1): Pt will complete toilet transfers with min A OT Short Term Goal 4 (Week 1): Pt will complete grooming task seated at the sink with set-up  Skilled Therapeutic Interventions/Progress Updates:    Pt seen for OT ADL bathing and dressing session. Pt in recliner upon arrival. Pt ambulated to sink with RW and close supervision. Pt completed UB and LB bathing and dressing task from w/c level at the sink with supervision. Pt stood at the sink to pull pants up, and sat abrubtly due to fatigue during LB dressing.  Pt stood with supervision- occasional steadying assist while holding onto counter with L UE to complete oral care, tolerating ~2 minutes of standing endurance before requiring seated rest break.    Pt returned to recliner at end of session. In recliner, he completed LE strengthening/ endurance exercises including: toe taps on stool, and hip Abduction and adduction x10 reps with rest breaks throughout.  Pt left sitting in recliner at end of session, all needs in reach and wife present.   He requires motivation for participation in functional tasks, however, is able to perform ADLs with supervision-mod I from seated position. Pt and caregiver educated regarding energy conservation, benefits of OOB, seated ROM/strengthening exercises, and d/c planning.   Therapy Documentation Pain: Pain Assessment Pain Assessment: No/denies pain  See FIM for current functional status  Therapy/Group: Individual Therapy  Samul, Olis Viverette C 01/27/2015, 10:36 AM

## 2015-01-27 NOTE — Progress Notes (Signed)
[place] PHYSICAL MEDICINE & REHABILITATION     PROGRESS NOTE    Subjective/Complaints: vigorously eating breakfast. Appetite good. Denies pain, n/v/d/sop/cp  Objective: Vital Signs: Blood pressure 114/54, pulse 57, temperature 97.5 F (36.4 C), temperature source Oral, resp. rate 19, weight 106.2 kg (234 lb 2.1 oz), SpO2 93 %. No results found. No results for input(s): WBC, HGB, HCT, PLT in the last 72 hours. No results for input(s): NA, K, CL, GLUCOSE, BUN, CREATININE, CALCIUM in the last 72 hours.  Invalid input(s): CO CBG (last 3)   Recent Labs  01/26/15 2051 01/26/15 2243 01/27/15 0637  GLUCAP 172* 137* 79    Wt Readings from Last 3 Encounters:  01/27/15 106.2 kg (234 lb 2.1 oz)  01/21/15 107.6 kg (237 lb 3.4 oz)  01/08/15 99.791 kg (220 lb)    Physical Exam:   General: Alert and oriented x 3, No apparent distress HEENT: Head is normocephalic, atraumatic, PERRLA, EOMI, sclera anicteric, oral mucosa pink and moist, dentition intact, ext ear canals clear,  Neck: Supple without JVD or lymphadenopathy Heart: Reg rate and rhythm. No murmurs rubs or gallops Chest: CTA bilaterally without wheezes, rales, or rhonchi; no distress Abdomen: Soft, non-tender, non-distended, bowel sounds positive. Extremities: No clubbing, cyanosis, or edema. Pulses are 2+ Skin: breakdown over bridge of nose healing. Scrotum and inguinal area less irritated. Stasis changes bilateral LE's.  Neuro: Pt is cognitively appropriate reasonable insight, memory,  and awareness. Minimal ptosis. Improved oral-motor control speech much clearer. Phonation better. Gag stronger.  Sensory exam is normal. Reflexes are 2+ in all 4's. Fine motor coordination is intact. No tremors. Motor function is grossly 4/5 uppers and 3+ to 4/5 lowers. Improved trunk control.  Musculoskeletal: Full ROM, No pain with AROM or PROM in the neck, trunk, or extremities. Posture appropriate Psych: Pt's affect is appropriate. Pt is  cooperative    Assessment/Plan: 1. Functional deficits secondary to myasthenia gravis with crisis which require 3+ hours per day of interdisciplinary therapy in a comprehensive inpatient rehab setting. Physiatrist is providing close team supervision and 24 hour management of active medical problems listed below. Physiatrist and rehab team continue to assess barriers to discharge/monitor patient progress toward functional and medical goals. FIM: FIM - Bathing Bathing Steps Patient Completed: Chest, Right Arm, Left Arm, Abdomen, Right upper leg, Left upper leg, Front perineal area, Right lower leg (including foot), Left lower leg (including foot) Bathing: 4: Min-Patient completes 8-9 53f 10 parts or 75+ percent  FIM - Upper Body Dressing/Undressing Upper body dressing/undressing steps patient completed: Thread/unthread right sleeve of pullover shirt/dresss, Thread/unthread left sleeve of pullover shirt/dress, Pull shirt over trunk Upper body dressing/undressing: 4: Min-Patient completed 75 plus % of tasks FIM - Lower Body Dressing/Undressing Lower body dressing/undressing steps patient completed: Pull pants up/down, Thread/unthread right pants leg, Don/Doff left sock, Don/Doff right sock Lower body dressing/undressing: 4: Min-Patient completed 75 plus % of tasks  FIM - Toileting Toileting steps completed by patient: Adjust clothing prior to toileting Toileting Assistive Devices: Grab bar or rail for support Toileting: 2: Max-Patient completed 1 of 3 steps  FIM - Radio producer Devices: Insurance account manager Transfers: 5-To toilet/BSC: Supervision (verbal cues/safety issues), 5-From toilet/BSC: Supervision (verbal cues/safety issues)  FIM - Control and instrumentation engineer Devices: Bed rails, Arm rests, Copy: 5: Supine > Sit: Supervision (verbal cues/safety issues), 4: Bed > Chair or W/C: Min A (steadying Pt. > 75%)  FIM -  Locomotion: Wheelchair Locomotion: Wheelchair: 5: Travels 150  ft or more: maneuvers on rugs and over door sills with supervision, cueing or coaxing FIM - Locomotion: Ambulation Locomotion: Ambulation Assistive Devices: Walker - Rolling Ambulation/Gait Assistance: 4: Min assist Locomotion: Ambulation: 1: Travels less than 50 ft with minimal assistance (Pt.>75%)  Comprehension Comprehension Mode: Auditory Comprehension: 6-Follows complex conversation/direction: With extra time/assistive device  Expression Expression Mode: Verbal Expression: 5-Expresses basic 90% of the time/requires cueing < 10% of the time.  Social Interaction Social Interaction: 4-Interacts appropriately 75 - 89% of the time - Needs redirection for appropriate language or to initiate interaction.  Problem Solving Problem Solving: 6-Solves complex problems: With extra time  Memory Memory: 5-Recognizes or recalls 90% of the time/requires cueing < 10% of the time  Medical Problem List and Plan: 1. Functional deficits secondary to MG crisis 2. DVT Prophylaxis/Anticoagulation: Pharmaceutical: Xarelto 3. Pain Management: Tylenol prn.  4. Mood: LCSW to follow for support. 5. Neuropsych: This patient is capable of making decisions on her own behalf. 6. Skin/Wound Care: Routine pressure relief measures.  nystatin powder to help with moisture/yeast in periarea.  7. Fluids/Electrolytes/Nutrition:  On regular diet.  Eating well! 8. DM type 2: Was on oral meds at home with good control. on Starlix and SSI. May resume additional oral agent. Monitor BS with ac/hs checks.   -HS snack for am hypoglycemia 12. A fib: Monitor heart rate every 8 hours. Continue amiodarone and Xarelto 13. C diff colitis: Has chronic diarrhea---stools improving  - Continue flagyl with "taper" once home  -probiotic/fiber  -on orals solely now   LOS (Days) 5 A FACE TO FACE EVALUATION WAS PERFORMED  Craig [name] T 01/27/2015 7:55 AM

## 2015-01-27 NOTE — Progress Notes (Signed)
Physical Therapy Session Note  Patient Details  Name: Skylor Schnapp MRN: [id number] Date of Birth: 01-Jan-1941  Today's Date: 01/27/2015 PT Individual Time: 0800-0900 PT Individual Time Calculation (min): 60 min   Short Term Goals: Week 1:  PT Short Term Goal 1 (Week 1): Pt will be able to perform bed mobility with min A PT Short Term Goal 2 (Week 1): Pt will be able to perform functional transfers consistently with min A PT Short Term Goal 3 (Week 1): Pt will be able to gait x 25' with min A with LRAD PT Short Term Goal 4 (Week 1): Pt will be able to go up/down 3 steps with R rail for home entry with mod A  Skilled Therapeutic Interventions/Progress Updates:   Session focused on overall endurance, functional bed mobility and transfers with RW, sit to stands and dynamic standing balance to pull up underwear and pants with S, w/c mobility for general strengthening and endurance with S, and stair negotiation for preparation for home entry (6" steps). Pt required mod A for ascending and min A for descending (backwards) using bilateral rails x 3 trials of 3 steps. Sit to stands overall close S to min A with cues for hand placement and to lock brakes before transferring. Gait with RW with min A x 50' with improved gait speed and posture noted but cues to remain close to RW for safety. Transferred to recliner end of session with wife at bedside.   Discussion with pt and wife in regards to PT goals, level of A needed currently, and home set-up for discharge planning. Aware of conference this afternoon.   Therapy Documentation Precautions:  Precautions Precautions: Fall Precaution Comments: Monitor O2 Restrictions Weight Bearing Restrictions: No  Pain:  Denies pain.  See FIM for current functional status  Therapy/Group: Individual Therapy  [name] [facility] 01/27/2015, 9:02 AM

## 2015-01-27 NOTE — Progress Notes (Signed)
Physical Therapy Session Note  Patient Details  Name: Craig [name] MRN: [id number] Date of Birth: 1941/07/09  Today's Date: 01/27/2015 PT Individual Time: 1300-1400 PT Individual Time Calculation (min): 60 min   Short Term Goals: Week 1:  PT Short Term Goal 1 (Week 1): Pt will be able to perform bed mobility with min A PT Short Term Goal 2 (Week 1): Pt will be able to perform functional transfers consistently with min A PT Short Term Goal 3 (Week 1): Pt will be able to gait x 25' with min A with LRAD PT Short Term Goal 4 (Week 1): Pt will be able to go up/down 3 steps with R rail for home entry with mod A  Skilled Therapeutic Interventions/Progress Updates:    Session focused on overall endurance, gait outdoors on uneven surfaces with RW (x 50' x 2) with min A, education on importance of continued mobility past discharge, stair negotiation training for home entry, and LE therex for functional strengthening/endurance. Pt performed stairs with mod A for ascending and min A descending with bilateral rails (pt unable to perform with 1 rail) x 3. Pt's wife present during session to observe and help with motivation for patient to try to push himself a little more. Performed w/c mobility outdoors on uneven surfaces for functional strengthening and endurance. End of session performed LAQ x 10 reps each BLE, standing hip marches x 10 reps each, and sit to stands with RW x 3 reps with steady A to address functional strength and endurance.   Therapy Documentation Precautions:  Precautions Precautions: Fall Precaution Comments: Monitor O2 Restrictions Weight Bearing Restrictions: No  Pain:  Denies pain.  See FIM for current functional status  Therapy/Group: Individual Therapy  [name] [facility] 01/27/2015, 2:03 PM

## 2015-01-28 ENCOUNTER — Inpatient Hospital Stay (HOSPITAL_COMMUNITY): Payer: PPO | Admitting: Speech Pathology

## 2015-01-28 ENCOUNTER — Inpatient Hospital Stay (HOSPITAL_COMMUNITY): Payer: PPO

## 2015-01-28 ENCOUNTER — Inpatient Hospital Stay (HOSPITAL_COMMUNITY): Payer: PPO | Admitting: *Deleted

## 2015-01-28 ENCOUNTER — Inpatient Hospital Stay (HOSPITAL_COMMUNITY): Payer: PPO | Admitting: Rehabilitation

## 2015-01-28 LAB — GLUCOSE, CAPILLARY
GLUCOSE-CAPILLARY: 78 mg/dL (ref 70–99)
GLUCOSE-CAPILLARY: 106 mg/dL — AB (ref 70–99)
Glucose-Capillary: 225 mg/dL — ABNORMAL HIGH (ref 70–99)
Glucose-Capillary: 260 mg/dL — ABNORMAL HIGH (ref 70–99)

## 2015-01-28 MED ORDER — METRONIDAZOLE 500 MG PO TABS: 500.0000 mg | ORAL_TABLET | Freq: Every day | ORAL | Status: DC

## 2015-01-28 MED ORDER — METRONIDAZOLE 500 MG PO TABS
500.0000 mg | ORAL_TABLET | Freq: Three times a day (TID) | ORAL | Status: DC
  Filled 2015-01-28 (×3): qty 1

## 2015-01-28 MED ORDER — METRONIDAZOLE 500 MG PO TABS
500.0000 mg | ORAL_TABLET | Freq: Three times a day (TID) | ORAL | Status: AC
  Administered 2015-01-28 – 2015-02-01 (×14): 500 mg via ORAL
  Filled 2015-01-28 (×14): qty 1

## 2015-01-28 MED ORDER — METRONIDAZOLE 500 MG PO TABS
500.0000 mg | ORAL_TABLET | Freq: Two times a day (BID) | ORAL | Status: DC
  Administered 2015-02-02 – 2015-02-03 (×3): 500 mg via ORAL
  Filled 2015-01-28 (×5): qty 1

## 2015-01-28 NOTE — Progress Notes (Signed)
[place] PHYSICAL MEDICINE & REHABILITATION     PROGRESS NOTE    Subjective/Complaints: Good appetite. Minimal cough. Asked about red area in groin  Objective: Vital Signs: Blood pressure 117/58, pulse 69, temperature 97.5 F (36.4 C), temperature source Oral, resp. rate 17, weight 113 kg (249 lb 1.9 oz), SpO2 100 %. No results found. No results for input(s): WBC, HGB, HCT, PLT in the last 72 hours. No results for input(s): NA, K, CL, GLUCOSE, BUN, CREATININE, CALCIUM in the last 72 hours.  Invalid input(s): CO CBG (last 3)   Recent Labs  01/27/15 1627 01/27/15 2032 01/28/15 0643  GLUCAP 236* 259* 78    Wt Readings from Last 3 Encounters:  01/28/15 113 kg (249 lb 1.9 oz)  01/21/15 107.6 kg (237 lb 3.4 oz)  01/08/15 99.791 kg (220 lb)    Physical Exam:   General: Alert and oriented x 3, No apparent distress HEENT: Head is normocephalic, atraumatic, PERRLA, EOMI, sclera anicteric, oral mucosa pink and moist, dentition intact, ext ear canals clear,  Neck: Supple without JVD or lymphadenopathy Heart: Reg rate and rhythm. No murmurs rubs or gallops Chest: CTA bilaterally without wheezes, rales, or rhonchi; no distress Abdomen: Soft, non-tender, non-distended, bowel sounds positive. Extremities: No clubbing, cyanosis, or edema. Pulses are 2+ Skin: breakdown over bridge of nose healing. Scrotum and inguinal area with mild redness. Non tender, moist. Stasis changes bilateral LE's.  Neuro: Pt is cognitively appropriate reasonable insight, memory,  and awareness. resolved ptosis. Improved oral-motor control speech much clearer. Phonation good. Gag stronger.  Sensory exam is normal. Reflexes are 2+ in all 4's. Fine motor coordination is intact. No tremors. Motor function is grossly 4/5 uppers and 3+ to 4/5 lowers. Improved trunk control.  Musculoskeletal: Full ROM, No pain with AROM or PROM in the neck, trunk, or extremities. Posture appropriate Psych: Pt's affect is appropriate.  Pt is cooperative    Assessment/Plan: 1. Functional deficits secondary to myasthenia gravis with crisis which require 3+ hours per day of interdisciplinary therapy in a comprehensive inpatient rehab setting. Physiatrist is providing close team supervision and 24 hour management of active medical problems listed below. Physiatrist and rehab team continue to assess barriers to discharge/monitor patient progress toward functional and medical goals. FIM: FIM - Bathing Bathing Steps Patient Completed: Chest, Right Arm, Left Arm, Abdomen, Right upper leg, Left upper leg, Front perineal area, Buttocks Bathing: 5: Supervision: Safety issues/verbal cues  FIM - Upper Body Dressing/Undressing Upper body dressing/undressing steps patient completed: Thread/unthread right sleeve of pullover shirt/dresss, Thread/unthread left sleeve of pullover shirt/dress, Pull shirt over trunk, Put head through opening of pull over shirt/dress Upper body dressing/undressing: 5: Set-up assist to: Obtain clothing/put away FIM - Lower Body Dressing/Undressing Lower body dressing/undressing steps patient completed: Pull pants up/down, Thread/unthread right pants leg, Thread/unthread left pants leg Lower body dressing/undressing: 5: Set-up assist to: Obtain clothing  FIM - Toileting Toileting steps completed by patient: Adjust clothing prior to toileting Toileting Assistive Devices: Grab bar or rail for support Toileting: 2: Max-Patient completed 1 of 3 steps  FIM - Radio producer Devices: Insurance account manager Transfers: 5-To toilet/BSC: Supervision (verbal cues/safety issues), 5-From toilet/BSC: Supervision (verbal cues/safety issues)  FIM - Control and instrumentation engineer Devices: Bed rails, Walker, Arm rests Bed/Chair Transfer: 5: Supine > Sit: Supervision (verbal cues/safety issues), 4: Bed > Chair or W/C: Min A (steadying Pt. > 75%)  FIM - Locomotion: Wheelchair Locomotion:  Wheelchair: 5: Travels 150 ft or more: maneuvers on  rugs and over door sills with supervision, cueing or coaxing FIM - Locomotion: Ambulation Locomotion: Ambulation Assistive Devices: Walker - Rolling Ambulation/Gait Assistance: 4: Min assist Locomotion: Ambulation: 2: Travels 50 - 149 ft with minimal assistance (Pt.>75%)  Comprehension Comprehension Mode: Auditory Comprehension: 6-Follows complex conversation/direction: With extra time/assistive device  Expression Expression Mode: Verbal Expression: 5-Expresses basic 90% of the time/requires cueing < 10% of the time.  Social Interaction Social Interaction: 4-Interacts appropriately 75 - 89% of the time - Needs redirection for appropriate language or to initiate interaction.  Problem Solving Problem Solving: 6-Solves complex problems: With extra time  Memory Memory: 5-Recognizes or recalls 90% of the time/requires cueing < 10% of the time  Medical Problem List and Plan: 1. Functional deficits secondary to MG crisis  -dc suction 2. DVT Prophylaxis/Anticoagulation: Pharmaceutical: Xarelto 3. Pain Management: Tylenol prn.  4. Mood: LCSW to follow for support. 5. Neuropsych: This patient is capable of making decisions on her own behalf. 6. Skin/Wound Care: Routine pressure relief measures.  nystatin powder to help with moisture/yeast in periarea.  7. Fluids/Electrolytes/Nutrition:  On regular diet.  Eating well! 8. DM type 2: Was on oral meds at home with good control. on Starlix and SSI.   -HS snack for am hypoglycemia  -change to cm moderate diet 12. A fib: Monitor heart rate every 8 hours. Continue amiodarone and Xarelto 13. C diff colitis: Has chronic diarrhea---stools improving  - Continue flagyl with "taper" once home  -probiotic/fiber  -on orals solely now   LOS (Days) 6 A FACE TO FACE EVALUATION WAS PERFORMED  De Libman T 01/28/2015 7:39 AM

## 2015-01-28 NOTE — Progress Notes (Signed)
Physical Therapy Session Note  Patient Details  Name: Craig [name] MRN: [id number] Date of Birth: 02/20/1941  Today's Date: 01/28/2015 PT Individual Time: 1530-1600 PT Individual Time Calculation (min): 30 min   Short Term Goals: Week 1:  PT Short Term Goal 1 (Week 1): Pt will be able to perform bed mobility with min A PT Short Term Goal 2 (Week 1): Pt will be able to perform functional transfers consistently with min A PT Short Term Goal 3 (Week 1): Pt will be able to gait x 25' with min A with LRAD PT Short Term Goal 4 (Week 1): Pt will be able to go up/down 3 steps with R rail for home entry with mod A  Skilled Therapeutic Interventions/Progress Updates:   Pt received sitting in w/c in room, agreeable to therapy session.  Wife present to observe during session.  Skilled session focused on w/c mobility for generalized strengthening and endurance, sit<>stands without UE support from varying heights, and stair training to simulate home entry, as well as for BLE strengthening.  Pt self propelled to/from therapy gym x 150' x 2 reps with BUEs/LEs at S level.  Pt needed two rest breaks in each direction due to fatigue.  Performed 6 sit<>stands from 21 1/5" and 22 1/5" mat surface without UE support at min to mod A level.  Pt fatigues very easily and needs cues for pursed lip breathing throughout.  Ended session with stair negotiation up/down 3, 6" steps with single handrail (utilized BUEs on same R rail) at mod A level.  Educated on energy conservation techniques and scheduling appts or outings when pt feeling the most energized.  Pt and wife verbalized understanding.  Pt propelled back to room and transferred to recliner.  All needs in reach.   Therapy Documentation Precautions:  Precautions Precautions: Fall Precaution Comments: Monitor O2 Restrictions Weight Bearing Restrictions: No  Pain: Pain Assessment Pain Assessment: No/denies pain  See FIM for current functional  status  Therapy/Group: Individual Therapy  Denice Bors 01/28/2015, 4:58 PM

## 2015-01-28 NOTE — Plan of Care (Signed)
Problem: RH PAIN MANAGEMENT Goal: RH STG PAIN MANAGED AT OR BELOW PT'S PAIN GOAL Pain managed with prn medication at or below patient's goal of 9  Outcome: Progressing Rates pain as a 7

## 2015-01-28 NOTE — Progress Notes (Signed)
Occupational Therapy Session Note  Patient Details  Name: Craig [name] MRN: [id number] Date of Birth: 1941-06-17  Today's Date: 01/28/2015 OT Individual Time: 0900-1000 OT Individual Time Calculation (min): 60 min    Short Term Goals: Week 2:     Skilled Therapeutic Interventions/Progress Updates:    Pt resting in w/c with wife present upon arrival.  Pt stated he had just returned from PT and was exhausted.  Pt agreeable to therapy this morning but declined shower.  Pt completed BADL retraining including bating and dressing with sit<>stand from w/c at sink.  Pt stood at sink to partially perform UB bathing before sitting to complete task.  Pt performed sit<>stand at sink X 6 throughout session.  Pt completed grooming tasks while standing at sink.  Pt fatigues quickly and required multiple rest breaks throughout session.  Focus on activity tolerance, sit<>stand, standing balance, BADL retraining, functional transfers, and safety awareness.  Therapy Documentation Precautions:  Precautions Precautions: Fall Precaution Comments: Monitor O2 Restrictions Weight Bearing Restrictions: No Pain: Pain Assessment Pain Assessment: No/denies pain Pain Score: 0-No pain  See FIM for current functional status  Therapy/Group: Individual Therapy  Leroy Libman 01/28/2015, 10:02 AM

## 2015-01-28 NOTE — Progress Notes (Signed)
Physical Therapy Session Note  Patient Details  Name: Craig [name] MRN: [id number] Date of Birth: 1941/09/22  Today's Date: 01/28/2015 PT Individual Time: 0800-0900 PT Individual Time Calculation (min): 60 min   Short Term Goals: Week 1:  PT Short Term Goal 1 (Week 1): Pt will be able to perform bed mobility with min A PT Short Term Goal 2 (Week 1): Pt will be able to perform functional transfers consistently with min A PT Short Term Goal 3 (Week 1): Pt will be able to gait x 25' with min A with LRAD PT Short Term Goal 4 (Week 1): Pt will be able to go up/down 3 steps with R rail for home entry with mod A  Skilled Therapeutic Interventions/Progress Updates:    Patient received semi-reclined in bed. Session focused on functional strengthening and activity tolerance with emphasis on sit<>stands, modified sit<>stands, and stair negotiation. Patient performs supine>sit with HOB elevated and use of bedrails with supervision secondary to rapid speed of movement. Several sit<>stands with minA-min guard for donning pants, then standing to have condom catheter removed. Wheelchair mobility >150' with B UEs and supervision, verbal cues for improved efficiency.   Forward step ups with B UEs on 5 inch steps with minA, 4x each LE; verbal cues for hand placement to bias recruitment of B LEs instead of pulling up with UEs. Modified sit<>stands from perch position in [place] with B HHA, biased UEs into elbow flexion to decreased pulling up. Emphasis on slow, controlled descent from stand>sit to facilitate improved eccentric quad control. Patient returned to room and left sitting in wheelchair with wife present, awaiting OT session.  Therapy Documentation Precautions:  Precautions Precautions: Fall Precaution Comments: Monitor O2 Restrictions Weight Bearing Restrictions: No Pain: Pain Assessment Pain Assessment: No/denies pain Pain Score: 0-No pain Locomotion : Ambulation Ambulation/Gait Assistance: Not  tested (comment) Wheelchair Mobility Distance: 150   See FIM for current functional status  Therapy/Group: Individual Therapy  Lillia Abed. Haroun Cotham, PT, DPT 01/28/2015, 8:57 AM

## 2015-01-28 NOTE — Progress Notes (Signed)
Original order for BIPAP was written on 01-22-15. Pt denies using BIPAP at home, there is no machine in his room, and he is in no acute distress at this time. RT will continue to monitor and treat pt as needed.

## 2015-01-28 NOTE — Patient Care Conference (Signed)
Inpatient RehabilitationTeam Conference and Plan of Care Update Date: 01/27/2015   Time: 2:05 PM    Patient Name: Craig [name]      Medical Record Number: [id number]  Date of Birth: Sep 22, 1941 Sex: Male         Room/Bed: 4M11C/4M11C-01 Payor Info: Payor: [employer] / Plan: [employer] / Product Type: *No Product type* /    Admitting Diagnosis: MG CRISIS    Admit Date/Time:  01/22/2015  2:48 PM Admission Comments: No comment available   Primary Diagnosis:  MG, crisis (myasthenia gravis) Principal Problem: MG, crisis (myasthenia gravis)  Patient Active Problem List   Diagnosis Date Noted  . Physical deconditioning   . C. difficile colitis   . Obstructive sleep apnea   . Benign essential hypertension antepartum   . HLD (hyperlipidemia)   . Hypophosphatemia   . Hypokalemia   . 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 Diabetes type 2, uncontrolled   . Respiratory abnormality   . Collapse   . Acute respiratory failure   . Myasthenia 01/10/2015  . Myasthenia gravis 01/08/2015  . 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 Myasthenia gravis with acute exacerbation 01/08/2015  . MG with exacerbation (myasthenia gravis) 12/29/2014  . Protein-calorie malnutrition, severe   . Esophageal reflux   . Chest pain 12/27/2014  . Chronic atrial fibrillation 12/27/2014  . Pain in the chest   . Diaphoresis   . Bradycardia   . Dysphagia   . MG, crisis (myasthenia gravis) 12/17/2014  . Dysphagia, pharyngoesophageal phase   . Enteritis due to Clostridium difficile   . Clostridium difficile diarrhea 12/15/2014  . Hypernatremia   . Acute exacerbation of myasthenia gravis   . Myasthenia gravis in crisis 12/05/2014  . Bladder cancer 12/05/2014  . HTN (hypertension) 12/05/2014  . A-fib 12/05/2014  . Chronic anticoagulation 12/05/2014  . Acute respiratory failure with hypoxia 12/05/2014    Expected Discharge Date: Expected Discharge Date:  02/03/15  Team Members Present: Physician leading conference: Dr. Alger Simons Social Worker Present: Alfonse Alpers, LCSW Nurse Present: Elliot Cousin, RN PT Present: [name], PT;Bridgett Ripa, PT OT Present: Other (comment) Napoleon Form, OT) SLP Present: Weston Anna, SLP Other (Discipline and Name): Danne Baxter, RN [facility]) PPS Coordinator present : Daiva Nakayama, RN, CRRN     Current Status/Progress Goal Weekly Team Focus  Medical   MG crisis.of TF. diarrhea improved. nuitrition better  stamina, strength  diet, pain, stool   Bowel/Bladder   Pt can be incontinent of bowel and bladder. LBM 01/26/15  Min A  Managed bowel and bladder   Swallow/Nutrition/ Hydration   Regular textures with thin liquids, intermittent supervision-Mod I  Mod I  tolerance of current diet, family education    ADL's   Supervision- min A for UB and LB bathing and dressing; min A toileting  supervision  Functional endurance; toileting   Mobility   min to mod A for transfers, min A short distance gait with RW, mod to max A for stairs  S overall; min A car and stairs  endurance, strengthening, gait, stairs, sit to stands, fam ed   Communication   Supervision   Mod I   Speech intelligibility strategies, family education    Safety/Cognition/ Behavioral Observations            Pain   RLQ discomfort. Tramadol 50mg  q 6hrs prn  <3  Assess for effectiveness   Skin   Abrasion to bridge on nose, OTA, unremarkable. Peg  to LUQ. Bilateral heels with peeling skin-utilizing eucerin cream  No additonal skin breakdown  Encourage to turn q 2hrs    Rehab Goals Patient on target to meet rehab goals: Yes Rehab Goals Revised: none *See Care Plan and progress notes for long and short-term goals.  Barriers to Discharge: stamina    Possible Resolutions to Barriers:  further activity tolerance training    Discharge Planning/Teaching Needs:  Pt plans to go home with his wife at d/c.  She feels comfortable with the  care pt may need at home.  Wife has received family education from nursing and therapy at pt's previous admission.  She will be available, as needed, for any new training needed.   Team Discussion:  Pt is on a regular diet now and he is pleased.  His stools have improved since stopping the tube feeds.  MD stated pt is stronger and better neurologically.  Speech reported pt is tolerating most of his diet, except for having trouble with meat.  They continue to work on his speech 3x/week.  PT is working on stairs, as these are hard to do due to strength and endurance still a little low.  Pt has supervision goals with min A for stairs.  Per RN, pt has incontinence issues at night.  Revisions to Treatment Plan:  None, besides diet changes listed above   Continued Need for Acute Rehabilitation Level of Care: The patient requires daily medical management by a physician with specialized training in physical medicine and rehabilitation for the following conditions: Daily direction of a multidisciplinary physical rehabilitation program to ensure safe treatment while eliciting the highest outcome that is of practical value to the patient.: Yes Daily medical management of patient stability for increased activity during participation in an intensive rehabilitation regime.: Yes Daily analysis of laboratory values and/or radiology reports with any subsequent need for medication adjustment of medical intervention for : Post surgical problems;Neurological problems  Craig [name], Craig [name] 01/28/2015, 10:53 AM

## 2015-01-28 NOTE — Progress Notes (Signed)
Social Work Patient ID: Craig [name], male   DOB: May 07, 1941, 74 y.o.   MRN: [id number]   CSW met with pt and his wife 01-27-15 to update them on team conference discussion and targeted d/c date of 02-03-15.  They were pleased with that, although pt would probably go home today if he were medically ready, he is so eager and motivated to get home.  They do not have any current concerns/questions/needs at this time, however CSW will continue to follow and assist as needed.

## 2015-01-28 NOTE — Progress Notes (Signed)
Speech Language Pathology Daily Session Note  Patient Details  Name: Craig [name] MRN: [id number] Date of Birth: [birth year]  Today's Date: 01/28/2015 SLP Individual Time: 1500-1530 SLP Individual Time Calculation (min): 30 min  Short Term Goals: Week 1: SLP Short Term Goal 1 (Week 1): Patient will consume current diet without overt s/s of aspiration with Mod I for use of swallowing compensatory strategies.  SLP Short Term Goal 2 (Week 1): Patient will utilize over articulation and an increased vocal intensity to maximize speech intelligibility at the sentence level to 100% accuracy with Mod I.   Skilled Therapeutic Interventions: Skilled therapy session focused on speech goals. Upon arrival, patient was upright in recliner, alert and agreeable to participate in therapy. Student facilitated session by providing Min A verbal cues for recall of intelligible speech strategies. Patient recalled diaphragmatic breathing technique to increase breath support during speech with Mod I and required Supervision A verbal cues to use the aforementioned strategies during functional conversation about goals for future therapy sessions to achieve 90% intelligibility at the phrase level. Plan for future structured reading task for next session. Per patient request, patient transferred to w/c with RN as assist. Patient handed off to PT. Continue with current plan of care.   FIM:  Comprehension Comprehension Mode: Auditory Comprehension: 6-Follows complex conversation/direction: With extra time/assistive device Expression Expression Mode: Verbal Expression: 5-Expresses basic needs/ideas: With no assist Social Interaction Social Interaction: 5-Interacts appropriately 90% of the time - Needs monitoring or encouragement for participation or interaction. Problem Solving Problem Solving: 6-Solves complex problems: With extra time Memory Memory: 5-Recognizes or recalls 90% of the time/requires cueing < 10% of the  time FIM - Eating Eating Activity: 6: Modified consistency diet: (comment)  Pain Pain Assessment Pain Assessment: No/denies pain  Therapy/Group: Individual Therapy  Craig [name] 01/28/2015, 4:27 PM

## 2015-01-29 ENCOUNTER — Inpatient Hospital Stay (HOSPITAL_COMMUNITY): Payer: PPO

## 2015-01-29 ENCOUNTER — Inpatient Hospital Stay (HOSPITAL_COMMUNITY): Payer: PPO | Admitting: Occupational Therapy

## 2015-01-29 LAB — GLUCOSE, CAPILLARY
Glucose-Capillary: 93 mg/dL (ref 70–99)
Glucose-Capillary: 90 mg/dL (ref 70–99)
Glucose-Capillary: 188 mg/dL — ABNORMAL HIGH (ref 70–99)
Glucose-Capillary: 177 mg/dL — ABNORMAL HIGH (ref 70–99)

## 2015-01-29 MED ORDER — FREE WATER
50.0000 mL | Freq: Every day | Status: DC
  Administered 2015-01-30 – 2015-02-03 (×5): 50 mL

## 2015-01-29 MED ORDER — PRO-STAT SUGAR FREE PO LIQD
30.0000 mL | Freq: Every day | ORAL | Status: DC
  Administered 2015-01-29 – 2015-02-03 (×6): 30 mL via ORAL
  Filled 2015-01-29 (×7): qty 30

## 2015-01-29 NOTE — Progress Notes (Signed)
Patient does not tolerate BIPAP and does not wish to wear it at this time. RT will continue to monitor

## 2015-01-29 NOTE — Progress Notes (Signed)
Physical Therapy Weekly Progress Note  Patient Details  Name: Craig [name] MRN: [id number] Date of Birth: Aug 02, 1941  Beginning of progress report period: January 23, 2015 End of progress report period: January 29, 2015  Patient has met 4 of 4 short term goals.  Pt is making good progress though still very limited by endurance. Pt requires frequent seated rest breaks. Stairs are pt's main difficulty with PT requiring mod to max A to complete 3 steps for home entry. Pt's wife has been present to observe most sessions and in agreement with endurance and stairs being main barrier to discharge at this time. Pt is currently using RW for all mobility.  Patient continues to demonstrate the following deficits: decreased strength, decreased endurance, decreased balance, and therefore will continue to benefit from skilled PT intervention to enhance overall performance with activity tolerance, balance, postural control and ability to compensate for deficits.  Patient progressing toward long term goals..  Continue plan of care.  PT Short Term Goals Week 1:  PT Short Term Goal 1 (Week 1): Pt will be able to perform bed mobility with min A PT Short Term Goal 1 - Progress (Week 1): Met PT Short Term Goal 2 (Week 1): Pt will be able to perform functional transfers consistently with min A PT Short Term Goal 2 - Progress (Week 1): Met PT Short Term Goal 3 (Week 1): Pt will be able to gait x 25' with min A with LRAD PT Short Term Goal 3 - Progress (Week 1): Met PT Short Term Goal 4 (Week 1): Pt will be able to go up/down 3 steps with R rail for home entry with mod A PT Short Term Goal 4 - Progress (Week 1): Met Week 2:  PT Short Term Goal 1 (Week 2): = LTGs  Skilled Therapeutic Interventions/Progress Updates:  Ambulation/gait training;Balance/vestibular training;Community reintegration;Discharge planning;Disease management/prevention;DME/adaptive equipment instruction;Functional mobility training;Neuromuscular  re-education;Pain management;Patient/family education;Psychosocial support;Skin care/wound management;Stair training;Therapeutic Activities;Therapeutic Exercise;UE/LE Strength taining/ROM;UE/LE Coordination activities;Wheelchair propulsion/positioning   Therapy Documentation Precautions:  Precautions Precautions: Fall Precaution Comments: Monitor O2 Restrictions Weight Bearing Restrictions: No  See FIM for current functional status    [name] [facility] 01/29/2015, 2:51 PM   Lars Masson, PT, DPT

## 2015-01-29 NOTE — Progress Notes (Signed)
Physical Therapy Session Note  Patient Details  Name: Mychael Smock MRN: [id number] Date of Birth: 02/18/1941  Today's Date: 01/29/2015 PT Individual Time: 0800-0900 PT Individual Time Calculation (min): 60 min   Short Term Goals: Week 1:  PT Short Term Goal 1 (Week 1): Pt will be able to perform bed mobility with min A PT Short Term Goal 2 (Week 1): Pt will be able to perform functional transfers consistently with min A PT Short Term Goal 3 (Week 1): Pt will be able to gait x 25' with min A with LRAD PT Short Term Goal 4 (Week 1): Pt will be able to go up/down 3 steps with R rail for home entry with mod A  Skilled Therapeutic Interventions/Progress Updates:    Session focused on overall endurance/activity tolerance, transfers with RW with focus on technique and hand placement with overall steady A, stair negotiation for home entry training, w/c propulsion for UE and LE strength and endurance, and gait training with RW for functional mobility training. For stair negotiation, able to perform with R rail (max A on first attempt) but changed hand placement for more lateral step ups and leading with LLE for ascending with mod A x 2 more trials. Gait x 3 trials x 50', x 50', and x 20' with min A and cues to stay inside RW and maintain upright posture. Transferred to reliner end of session with steady A using RW.  Therapy Documentation Precautions:  Precautions Precautions: Fall Precaution Comments: Monitor O2 Restrictions Weight Bearing Restrictions: No  Pain:  Denies pain  See FIM for current functional status  Therapy/Group: Individual Therapy  [name] [facility] 01/29/2015, 9:50 AM

## 2015-01-29 NOTE — Progress Notes (Signed)
[facility] PHYSICAL MEDICINE & REHABILITATION     PROGRESS NOTE    Subjective/Complaints: Good appetite. Minimal cough. Asked about red area in groin  Objective: Vital Signs: Blood pressure 123/66, pulse 74, temperature 98.4 F (36.9 C), temperature source Oral, resp. rate 16, weight 104.3 kg (229 lb 15 oz), SpO2 92 %. No results found. No results for input(s): WBC, HGB, HCT, PLT in the last 72 hours. No results for input(s): NA, K, CL, GLUCOSE, BUN, CREATININE, CALCIUM in the last 72 hours.  Invalid input(s): CO CBG (last 3)   Recent Labs  01/28/15 1654 01/28/15 2034 01/29/15 0656  GLUCAP 225* 260* 93    Wt Readings from Last 3 Encounters:  01/29/15 104.3 kg (229 lb 15 oz)  01/21/15 107.6 kg (237 lb 3.4 oz)  01/08/15 99.791 kg (220 lb)    Physical Exam:   General: Alert and oriented x 3, No apparent distress HEENT: Head is normocephalic, atraumatic, PERRLA, EOMI, sclera anicteric, oral mucosa pink and moist, dentition intact, ext ear canals clear,  Neck: Supple without JVD or lymphadenopathy Heart: Reg rate and rhythm. No murmurs rubs or gallops Chest: CTA bilaterally without wheezes, rales, or rhonchi; no distress Abdomen: Soft, non-tender, non-distended, bowel sounds positive. Extremities: No clubbing, cyanosis, or edema. Pulses are 2+ Skin: breakdown over bridge of nose healing. Scrotum and inguinal area with mild redness. Non tender, moist. Stasis changes bilateral LE's.  Neuro: Pt is cognitively appropriate reasonable insight, memory,  and awareness. resolved ptosis. Improved oral-motor control speech much clearer. Phonation good. Gag stronger.  Sensory exam is normal. Reflexes are 2+ in all 4's. Fine motor coordination is intact. No tremors. Motor function is grossly 4/5 uppers and 3+ to 4/5 lowers. Improved trunk control.  Musculoskeletal: Full ROM, No pain with AROM or PROM in the neck, trunk, or extremities. Posture appropriate Psych: Pt's affect is  appropriate. Pt is cooperative    Assessment/Plan: 1. Functional deficits secondary to myasthenia gravis with crisis which require 3+ hours per day of interdisciplinary therapy in a comprehensive inpatient rehab setting. Physiatrist is providing close team supervision and 24 hour management of active medical problems listed below. Physiatrist and rehab team continue to assess barriers to discharge/monitor patient progress toward functional and medical goals. FIM: FIM - Bathing Bathing Steps Patient Completed: Chest, Right Arm, Left Arm, Abdomen, Right upper leg, Left upper leg, Front perineal area, Buttocks Bathing: 4: Min-Patient completes 8-9 55f 10 parts or 75+ percent  FIM - Upper Body Dressing/Undressing Upper body dressing/undressing steps patient completed: Thread/unthread right sleeve of pullover shirt/dresss, Thread/unthread left sleeve of pullover shirt/dress, Pull shirt over trunk, Put head through opening of pull over shirt/dress Upper body dressing/undressing: 5: Set-up assist to: Obtain clothing/put away FIM - Lower Body Dressing/Undressing Lower body dressing/undressing steps patient completed: Thread/unthread right pants leg, Thread/unthread left pants leg, Pull pants up/down, Don/Doff right sock, Don/Doff left sock Lower body dressing/undressing: 4: Min-Patient completed 75 plus % of tasks  FIM - Toileting Toileting steps completed by patient: Adjust clothing prior to toileting Toileting Assistive Devices: Grab bar or rail for support Toileting: 2: Max-Patient completed 1 of 3 steps  FIM - Radio producer Devices: Insurance account manager Transfers: 5-To toilet/BSC: Supervision (verbal cues/safety issues), 5-From toilet/BSC: Supervision (verbal cues/safety issues)  FIM - Control and instrumentation engineer Devices: Bed rails, Walker, Arm rests, HOB elevated Bed/Chair Transfer: 5: Supine > Sit: Supervision (verbal cues/safety issues), 4: Bed  > Chair or W/C: Min A (steadying Pt. > 75%),  4: Chair or W/C > Bed: Min A (steadying Pt. > 75%)  FIM - Locomotion: Wheelchair Distance: 150 Locomotion: Wheelchair: 5: Travels 150 ft or more: maneuvers on rugs and over door sills with supervision, cueing or coaxing FIM - Locomotion: Ambulation Locomotion: Ambulation Assistive Devices: Administrator Ambulation/Gait Assistance: Not tested (comment) Locomotion: Ambulation: 0: Activity did not occur  Comprehension Comprehension Mode: Auditory Comprehension: 6-Follows complex conversation/direction: With extra time/assistive device  Expression Expression Mode: Verbal Expression: 5-Expresses basic needs/ideas: With no assist  Social Interaction Social Interaction: 5-Interacts appropriately 90% of the time - Needs monitoring or encouragement for participation or interaction.  Problem Solving Problem Solving: 6-Solves complex problems: With extra time  Memory Memory: 5-Recognizes or recalls 90% of the time/requires cueing < 10% of the time  Medical Problem List and Plan: 1. Functional deficits secondary to MG crisis    2. DVT Prophylaxis/Anticoagulation: Pharmaceutical: Xarelto 3. Pain Management: Tylenol prn.  4. Mood: LCSW to follow for support. 5. Neuropsych: This patient is capable of making decisions on her own behalf. 6. Skin/Wound Care: Routine pressure relief measures.  nystatin powder to help with moisture/yeast in periarea.  7. Fluids/Electrolytes/Nutrition:  On regular diet.  Eating well! 8. DM type 2: Was on oral meds at home with good control. on Starlix and SSI.   -HS snack for am hypoglycemia  -changed to cm moderate diet 12. A fib: Monitor heart rate every 8 hours. Continue amiodarone and Xarelto 13. C diff colitis: Has chronic diarrhea---stools improving  - Continue flagyl with "taper" over 2 weeks  -probiotic/fiber  -on orals solely now  -dc central line   LOS (Days) 7 A FACE TO FACE EVALUATION WAS  PERFORMED  Mouhamadou Gittleman T 01/29/2015 8:06 AM

## 2015-01-29 NOTE — Progress Notes (Signed)
Occupational Therapy Session Note  Patient Details  Name: Craig [name] MRN: [id number] Date of Birth: Apr 12, 1941  Today's Date: 01/29/2015 OT Individual Time: 1000-1100 OT Individual Time Calculation (min): 60 min    Short Term Goals: Week 1:  OT Short Term Goal 1 (Week 1): Pt will complete LB dressing sitting EOB with min A OT Short Term Goal 2 (Week 1): Pt will complete UB dressing with set-up OT Short Term Goal 3 (Week 1): Pt will complete toilet transfers with min A OT Short Term Goal 4 (Week 1): Pt will complete grooming task seated at the sink with set-up  Skilled Therapeutic Interventions/Progress Updates:    Pt seen for ADL bathing and dressing task. Pt in recliner upon arrival, agreeable to showering task. Pt ambulated with RW and close supervision to shower chair. He completed seated showering task with supervision and cues. Pt required assist for buttock hygiene, as he was unable to reach backside via lateral leans for thorough cleaning. Pt with redness in buttock area, barrier cream applied following shower and RN made aware. Pt dressed from w/c level with increased time and set-up. Pt with decreased functional endurance, requiring seated rest breaks throughout. Functional transfers completed with close supervision via squat transfers. He returned to supine at end of session with close supervision w/c>EOB>supine in prep for off unit procedure, all needs in reach and caregiver present.   Pt and caregiver educated regarding energy conservation, benefits of participation with ADLs, benefits of OOB, and d/c planning.   Therapy Documentation Precautions:  Precautions Precautions: Fall Precaution Comments: Monitor O2 Restrictions Weight Bearing Restrictions: No Pain: Pain Assessment Pain Assessment: No/denies pain  See FIM for current functional status  Therapy/Group: Individual Therapy  Atilla, Araceli Coufal C 01/29/2015, 12:53 PM

## 2015-01-29 NOTE — Progress Notes (Signed)
NUTRITION FOLLOW UP  DOCUMENTATION CODES Per approved criteria  -Obesity Unspecified   INTERVENTION: Provide 30 ml Prostat po once daily, each supplement provides 100 kcal and 15 grams of protein.  Continue free water flushes of 50 ml daily via PEG.  Encourage adequate PO intake.  NUTRITION DIAGNOSIS: Increased nutrient needs related to therapy as evidenced by estimated nutrition needs; ongoing   Goal: Pt to meet >/= 90% of their estimated nutrition needs; met  Monitor:  PO intake, weight trends, labs, I/O's  74 y.o. male  Admitting Dx: MG, crisis (myasthenia gravis)  ASSESSMENT: Pt with history of AFib, bladder cancer, DM type 2, MG with recent flare who was recently discharged to home past CIR on 01/02/15. He was seen by outpatient neurologist on 02/26 with plans to start home IVIG but was readmitted on 01/10/15 with fatigue,worsening of SOB and hypoxia.  Pt reports having a good appetite. Meal completion has been mostly 100%. Noted pt with pressure ulcers. Pt is agreeable to Prostat to aid in wound healing. RD to order. Pt was encouraged to eat his food at meals.  Labs and medications reviewed.  Height: Ht Readings from Last 1 Encounters:  01/11/15 _0  (1.702 m)    Weight: Wt Readings from Last 1 Encounters:  01/29/15 229 lb 15 oz (104.3 kg)    BMI:  Body mass index is 36.01 kg/(m^2). Class II obesity  Re-Estimated Nutritional Needs: Kcal: 1850-2100 Protein: 95-115 grams Fluid: >/= 1.8 L/day  Skin: Stage II pressure ulcer on buttocks, stage II pressure ulcer on nose  Diet Order: Diet Carb Modified   Intake/Output Summary (Last 24 hours) at 01/29/15 1139 Last data filed at 01/29/15 0900  Gross per 24 hour  Intake    585 ml  Output    850 ml  Net   -265 ml    Last BM: 3/16  Labs:   Recent Labs Lab 01/23/15 0608  NA 142  K 3.5  CL 104  CO2 32  BUN 16  CREATININE 0.60  CALCIUM 8.4  GLUCOSE 156*    CBG (last 3)   Recent Labs  01/28/15 1654 01/28/15 2034 01/29/15 0656  GLUCAP 225* 260* 93    Scheduled Meds: . amiodarone  100 mg Per Tube Daily  . bacitracin   Topical BID  . famotidine  40 mg Per Tube Daily  . feeding supplement (PRO-STAT SUGAR FREE 64)  30 mL Oral Daily  . [START ON 01/30/2015] free water  50 mL Per Tube Daily  . heparin lock flush  500 Units Intracatheter Q30 days  . hydrocerin   Topical BID  . insulin aspart  0-15 Units Subcutaneous TID PC & HS  . [START ON 02/02/2015] metroNIDAZOLE  500 mg Oral Q12H   Followed by  . [START ON 02/09/2015] metroNIDAZOLE  500 mg Oral Q0600  . metroNIDAZOLE  500 mg Oral 3 times per day  . mupirocin ointment   Topical Daily  . nateglinide  120 mg Oral TID WC  . nystatin   Topical TID  . polycarbophil  625 mg Oral Daily  . predniSONE  60 mg Per Tube Q breakfast  . pyridostigmine  60 mg Oral 4 times per day  . rivaroxaban  20 mg Per Tube Q supper  . saccharomyces boulardii  250 mg Oral BID    Continuous Infusions:    Past Medical History  Diagnosis Date  . Atrial fibrillation, chronic     on Xarelto   . HTN (hypertension)   .  CHF (congestive heart failure)   . Diabetes mellitus   . 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 Myasthenia gravis     Past Surgical History  Procedure Laterality Date  . Abdominal wall metastasis removal      Kallie Locks, MS, RD, LDN Pager # [phone] After hours/ weekend pager # [phone]

## 2015-01-29 NOTE — Progress Notes (Signed)
Physical Therapy Session Note  Patient Details  Name: Craig [name] MRN: [id number] Date of Birth: 1941/05/10  Today's Date: 01/29/2015 PT Individual Time: 1400-1500 PT Individual Time Calculation (min): 60 min   Short Term Goals: Week 2:  PT Short Term Goal 1 (Week 2): = LTGs  Skilled Therapeutic Interventions/Progress Updates:  Session focused on overall endurance/activity tolerance, short distance gait with RW with min A, transfers with RW including simulated car transfer (x 2 reps), stair negotiation, and Nustep for functional strengthening and endurance. Pt performing most transfers with S to steady A during session with less cues needed for technique. Stairs with bilateral and R rail with mod A and 1 episode of max A for preparation for home entry. Nustep x 10 min (break after 5 min) on level 4 with LE's only to address LE strength and overall cardiovascular endurance. Transferred to recliner end of session with close S.  Therapy Documentation Precautions:  Precautions Precautions: Fall Precaution Comments: Monitor O2 Restrictions Weight Bearing Restrictions: No  Pain:  Denies pain.  See FIM for current functional status  Therapy/Group: Individual Therapy  [name] [facility] 01/29/2015, 3:53 PM

## 2015-01-30 ENCOUNTER — Inpatient Hospital Stay (HOSPITAL_COMMUNITY): Payer: PPO | Admitting: Physical Therapy

## 2015-01-30 ENCOUNTER — Inpatient Hospital Stay (HOSPITAL_COMMUNITY): Payer: PPO | Admitting: *Deleted

## 2015-01-30 ENCOUNTER — Inpatient Hospital Stay (HOSPITAL_COMMUNITY): Payer: PPO | Admitting: Speech Pathology

## 2015-01-30 ENCOUNTER — Inpatient Hospital Stay (HOSPITAL_COMMUNITY): Payer: PPO | Admitting: Occupational Therapy

## 2015-01-30 LAB — GLUCOSE, CAPILLARY
GLUCOSE-CAPILLARY: 88 mg/dL (ref 70–99)
Glucose-Capillary: 81 mg/dL (ref 70–99)
Glucose-Capillary: 145 mg/dL — ABNORMAL HIGH (ref 70–99)
Glucose-Capillary: 178 mg/dL — ABNORMAL HIGH (ref 70–99)

## 2015-01-30 NOTE — Progress Notes (Signed)
Occupational Therapy Weekly Progress Note  Patient Details  Name: Craig [name] MRN: [id number] Date of Birth: 1941-02-19  Beginning of progress report period: January 23, 2015 End of progress report period: January 30, 2015  Today's Date: 01/30/2015 OT Individual Time: 0930-1030 OT Individual Time Calculation (min): 60 min    Patient has met 4 of 4 short term goals.    Patient continues to demonstrate the following deficits: functional activity tolerance, functional standing balance, and UB and LB dressing and therefore will continue to benefit from skilled OT intervention to enhance overall performance with BADL and Reduce care partner burden.  Patient progressing toward long term goals.  Continue plan of care.  OT Short Term Goals Week 1:  OT Short Term Goal 1 (Week 1): Pt will complete LB dressing sitting EOB with min A OT Short Term Goal 1 - Progress (Week 1): Met OT Short Term Goal 2 (Week 1): Pt will complete UB dressing with set-up OT Short Term Goal 2 - Progress (Week 1): Met OT Short Term Goal 3 (Week 1): Pt will complete toilet transfers with min A OT Short Term Goal 3 - Progress (Week 1): Met OT Short Term Goal 4 (Week 1): Pt will complete grooming task seated at the sink with set-up OT Short Term Goal 4 - Progress (Week 1): Met OT Short Term Goal 5 (Week 1): Pt will thread LB clothing with setup  Week 2:  OT Short Term Goal 1 (Week 2): LTG=STG  Skilled Therapeutic Interventions/Progress Updates:    Pt seen for ADL bathing and dressing session along with practice for functional transfers. Pt in supine upon arrival agreeable to bathing task seated EOB. Pt transferred supine>EOB with cues for technique. He bathed seated EOB, demonstrating functional balance to bend and reach B feet, utilizing B UEs, reaching outside of midline without LOB.  He required min A to pull head through shirt, however, demonstrated good carry over of education for modified dressing techniques taught in  previous session. Pt able to thread B LEs into pants and stood at RW to pull pants up. Pt donned B socks and shoes with use of small step stool under feet and required assist to fasten shoes. Pt taken to ADL apartment to practice functional transfers. Pt ambulated throughout ADL apartment with RW and cues for safety/ management. Pt able to complete bed mobility on standard bed with supervision. He required cues when sitting on low surface couch, and required mod A+2 to stand from low soft surface. Pt able to ambulate ~150 ft back to pt's room before fatiguing and requesting return to w/c. Pt returned to in-room recliner from w/c with supervision.  Pt left in recliner with all needs in reach, wife present.    Pt and caregiver educated regarding energy conservation, RW management, need for assist, benefits of OOB, and d/c planning.   Therapy Documentation Precautions:  Precautions Precautions: Fall Precaution Comments: Monitor O2 Restrictions Weight Bearing Restrictions: No  See FIM for current functional status  Therapy/Group: Individual Therapy  [name], Tavyn Kurka C 01/30/2015, 7:17 AM

## 2015-01-30 NOTE — Progress Notes (Signed)
Physical Therapy Session Note  Patient Details  Name: Craig [name] MRN: [id number] Date of Birth: [birth year]  Today's Date: 01/30/2015 PT Individual Time: 1300-1400 PT Individual Time Calculation (min): 60 min   Short Term Goals: Week 2:  PT Short Term Goal 1 (Week 2): = LTGs  Skilled Therapeutic Interventions/Progress Updates:    Gait Training: PT instructs pt in ambulation with RW 79' + 75' + 70' req seated rest break for a few minutes in between each rep and CGA for safety, as well as verbal cues to stand closer to the walker.  Pt wishes to attempt stairs with [facility] (turned 90 degrees laterally to fit on step) and R rail, and so one 6" height stair trialed and pt req min A, then two 6" height stairs req min A - then pt req a seated rest break. PT then instructs pt in trialing 3 stairs of 6" height (forward ascent, backwards descent) req min A and verbal cues for sequencing.   Neuromuscular Reeducation: PT instructs pt in TUG for 2 trials and pt scores: 21 seconds and 29 seconds- done with RW and CGA for safety. In between sets, PT cues pt to stay inside walker on 2nd set and pt demonstrates much improved safety during ambulation despite longer time measured.   Therapeutic Exercise: PT places pt on Nu-Step at L4 and pt completes B LE/UEs x 5 minutes, then x 5 minutes with LEs only - focus on improving pt's endurance & activity tolerance.   Pt did relatively well on stairs with [facility] - req min A with that AD and R rail. Pt does demonstrate difficulty with short term memory and following multi-step commands. Pt's R LE is weaker than L LE, evidenced by req assist to place R foot on nu-step pedal, but able to place L foot independently. PT asked pt and wife if they planned on building a ramp at home and wife said they were hoping pt would be strong enough to perform stairs at d/c. Continue per PT POC.     Therapy Documentation Precautions:  Precautions Precautions: Fall Precaution Comments:  Monitor O2 Restrictions Weight Bearing Restrictions: No Pain: Pain Assessment Pain Assessment: No/denies pain Pain Score: 9  Pain Type: Chronic pain Pain Location: Abdomen Pain Descriptors / Indicators: Aching Pain Onset: On-going Patients Stated Pain Goal: 7 Pain Intervention(s): Medication (See eMAR)  Balance: Balance Balance Assessed: Yes Standardized Balance Assessment Standardized Balance Assessment: Timed Up and Go Test Timed Up and Go Test TUG: Normal TUG Normal TUG (seconds): 21  See FIM for current functional status  Therapy/Group: Individual Therapy  Analyse Angst M 01/30/2015, 1:19 PM

## 2015-01-30 NOTE — Progress Notes (Signed)
Speech Language Pathology Weekly Progress and Session Note  Patient Details  Name: Craig [name] MRN: [id number] Date of Birth: [birth year]  Beginning of progress report period: January 23, 2015 End of progress report period: January 30, 2015  Today's Date: 01/30/2015 SLP Individual Time: 9924-2683 SLP Individual Time Calculation (min): 45 min  Short Term Goals: Week 1: SLP Short Term Goal 1 (Week 1): Patient will consume current diet without overt s/s of aspiration with Mod I for use of swallowing compensatory strategies.  SLP Short Term Goal 1 - Progress (Week 1): Not met SLP Short Term Goal 2 (Week 1): Patient will utilize over articulation and an increased vocal intensity to maximize speech intelligibility at the sentence level to 100% accuracy with Mod I.  SLP Short Term Goal 2 - Progress (Week 1): Met    New Short Term Goals: Week 2: SLP Short Term Goal 1 (Week 2): Patient will consume regular textures and thin liquids with Mod I for use of swallowing compensatory strategies.  SLP Short Term Goal 2 (Week 2): Patient and wife will verbalize understanding of dysphagia education with Mod I following teachback.    Weekly Progress Updates: Patient has made functional gains this reporting period and has met 1 out of 2 short term goals due to improved speech intelligibility.  Currently, patient continues to require Supervision assist for recall and utilization of swallow compensatory strategies and continues to demonstrate overt s/s of aspiration with PO intake.  Patient and family education is ongoing.  As a result, patient would benefit from continued skilled SLP intervention in order to maximize his functional independence prior to discharge home with wife.    Intensity: Minumum of 1-2 x/day, 30 to 90 minutes Frequency: 1 to 3 out of 7 days Duration/Length of Stay: 1 week  Treatment/Interventions: Dysphagia/aspiration precaution training;Cueing hierarchy;Patient/family education;Therapeutic  Activities;Oral motor exercises;Speech/Language facilitation   Daily Session  Skilled Therapeutic Interventions: Skilled treatment session focused on addressing dysphagia and dysarthria goals.  Patient consumed limited amounts of regular textures and thin liquids with intermittent over s/s of aspiration.  While strong reflexive coughs were present intermittently, they were audibly wet and more frequent with mixed consistencies such as bites of soup. Wife present for session and reported that patient had not been having this much difficulty; as a result, SLP will continue to address this goal in the next reporting period.  SLP also facilitated session with functional conversation and patient was fully intelligible at the sentence level with Mod I.      FIM:  Comprehension Comprehension Mode: Auditory Comprehension: 5-Follows basic conversation/direction: With extra time/assistive device Expression Expression Mode: Verbal Expression: 6-Expresses complex ideas: With extra time/assistive device Social Interaction Social Interaction: 5-Interacts appropriately 90% of the time - Needs monitoring or encouragement for participation or interaction. Problem Solving Problem Solving: 5-Solves basic 90% of the time/requires cueing < 10% of the time Memory Memory: 5-Recognizes or recalls 90% of the time/requires cueing < 10% of the time FIM - Eating Eating Activity: 6: More than reasonable amount of time General    Pain Pain Assessment Pain Assessment: No/denies pain Pain Score: 7   Therapy/Group: Individual Therapy  Carmelia Roller., CCC-SLP [phone]  [place] 01/30/2015, 3:00 PM

## 2015-01-30 NOTE — Progress Notes (Signed)
Occupational Therapy Session Note  Patient Details  Name: Craig [name] MRN: [id number] Date of Birth: 15-Jun-1941  Today's Date: 01/30/2015 OT Individual Time:  -   1500-1530  (30 min)      Short Term Goals: Week 1:  OT Short Term Goal 1 (Week 1): Pt will complete LB dressing sitting EOB with min A OT Short Term Goal 1 - Progress (Week 1): Met OT Short Term Goal 2 (Week 1): Pt will complete UB dressing with set-up OT Short Term Goal 2 - Progress (Week 1): Met OT Short Term Goal 3 (Week 1): Pt will complete toilet transfers with min A OT Short Term Goal 3 - Progress (Week 1): Met OT Short Term Goal 4 (Week 1): Pt will complete grooming task seated at the sink with set-up OT Short Term Goal 4 - Progress (Week 1): Met OT Short Term Goal 5 (Week 1): Pt will thread LB clothing with setup  OT Short Term Goal 5 - Progress (Week 1): Met Week 2:  OT Short Term Goal 1 (Week 2): LTG=STG  Skilled Therapeutic Interventions/Progress Updates:      Pt sitting in recliner.  Addressed sit to stand, functional mobility, standing balance.  Pt. Ambulated to bathroom with RW and performed sit to stand x3 using grab bars and min assist.  Ambulated to sink and washed hands with SBA for balance using counter for balance as well.  Pt started walking backwards with RW to sit down in recliner and Right knee buckled. Pt. Able to maintain upright position with assistance from OT.  Sat him on bed.  After 5 minute rest break, pt ambulated back to recliner.  Left pt with all needs in reach.   Wife present during treatment session.    Therapy Documentation Precautions:  Precautions Precautions: Fall Precaution Comments: Monitor O2 Restrictions Weight Bearing Restrictions: No      Pain: Pain Assessment Pain Assessment: 0-10 Pain Score: 9  Pain Type: Chronic pain Pain Location: Abdomen Pain Orientation: Right;Lower Pain Descriptors / Indicators: Aching Patients Stated Pain Goal: 7 Pain Intervention(s):  Medication (See eMAR)        See FIM for current functional status  Therapy/Group: Individual Therapy  Lisa Roca 01/30/2015, 5:58 PM

## 2015-01-30 NOTE — Plan of Care (Signed)
Problem: RH PAIN MANAGEMENT Goal: RH STG PAIN MANAGED AT OR BELOW PT'S PAIN GOAL Pain managed with prn medication at or below patient's goal of 9  Outcome: Not Progressing Using Ultram prn and Tylenol tween for breakthrough pain

## 2015-01-30 NOTE — Progress Notes (Signed)
[place] PHYSICAL MEDICINE & REHABILITATION     PROGRESS NOTE    Subjective/Complaints: Feeling well. Pt doesn't want BIPAP while here due to comfort/fit issues  Objective: Vital Signs: Blood pressure 103/61, pulse 70, temperature 97.5 F (36.4 C), temperature source Oral, resp. rate 16, weight 109.8 kg (242 lb 1 oz), SpO2 92 %. No results found. No results for input(s): WBC, HGB, HCT, PLT in the last 72 hours. No results for input(s): NA, K, CL, GLUCOSE, BUN, CREATININE, CALCIUM in the last 72 hours.  Invalid input(s): CO CBG (last 3)   Recent Labs  01/29/15 1647 01/29/15 2104 01/30/15 0719  GLUCAP 188* 177* 81    Wt Readings from Last 3 Encounters:  01/30/15 109.8 kg (242 lb 1 oz)  01/21/15 107.6 kg (237 lb 3.4 oz)  01/08/15 99.791 kg (220 lb)    Physical Exam:   General: Alert and oriented x 3, No apparent distress HEENT: Head is normocephalic, atraumatic, PERRLA, EOMI, sclera anicteric, oral mucosa pink and moist, dentition intact, ext ear canals clear,  Neck: Supple without JVD or lymphadenopathy Heart: Reg rate and rhythm. No murmurs rubs or gallops Chest: CTA bilaterally without wheezes, rales, or rhonchi; no distress Abdomen: Soft, non-tender, non-distended, bowel sounds positive. Extremities: No clubbing, cyanosis, or edema. Pulses are 2+ Skin: breakdown over bridge of nose healing gradually. Scrotum and inguinal area with mild redness. Non tender, moist. Stasis changes bilateral LE's.  Neuro: Pt is cognitively appropriate reasonable insight, memory,  and awareness. resolved ptosis. Improved oral-motor control speech much clearer. Phonation good. Gag stronger.  Sensory exam is normal. Reflexes are 2+ in all 4's. Fine motor coordination is intact. No tremors. Motor function is grossly 4/5 uppers and 3+ to 4/5 lowers. Improved trunk control.  Musculoskeletal: Full ROM, No pain with AROM or PROM in the neck, trunk, or extremities. Posture appropriate Psych: Pt's  affect is appropriate. Pt is cooperative    Assessment/Plan: 1. Functional deficits secondary to myasthenia gravis with crisis which require 3+ hours per day of interdisciplinary therapy in a comprehensive inpatient rehab setting. Physiatrist is providing close team supervision and 24 hour management of active medical problems listed below. Physiatrist and rehab team continue to assess barriers to discharge/monitor patient progress toward functional and medical goals. FIM: FIM - Bathing Bathing Steps Patient Completed: Chest, Right Arm, Left Arm, Abdomen, Right upper leg, Left upper leg, Front perineal area Bathing: 3: Mod-Patient completes 5-7 46f 10 parts or 50-74%  FIM - Upper Body Dressing/Undressing Upper body dressing/undressing steps patient completed: Thread/unthread right sleeve of pullover shirt/dresss, Thread/unthread left sleeve of pullover shirt/dress, Pull shirt over trunk, Put head through opening of pull over shirt/dress Upper body dressing/undressing: 5: Set-up assist to: Obtain clothing/put away FIM - Lower Body Dressing/Undressing Lower body dressing/undressing steps patient completed: Thread/unthread right pants leg, Thread/unthread left pants leg, Pull pants up/down, Don/Doff right sock, Don/Doff left sock Lower body dressing/undressing: 5: Set-up assist to: Obtain clothing  FIM - Toileting Toileting steps completed by patient: Adjust clothing prior to toileting Toileting Assistive Devices: Grab bar or rail for support Toileting: 2: Max-Patient completed 1 of 3 steps  FIM - Radio producer Devices: Insurance account manager Transfers: 5-To toilet/BSC: Supervision (verbal cues/safety issues), 5-From toilet/BSC: Supervision (verbal cues/safety issues)  FIM - Control and instrumentation engineer Devices: Arm rests, Copy: 5: Supine > Sit: Supervision (verbal cues/safety issues), 5: Bed > Chair or W/C: Supervision (verbal  cues/safety issues)  FIM - Locomotion: Wheelchair Distance: 150  Locomotion: Wheelchair: 5: Travels 150 ft or more: maneuvers on rugs and over door sills with supervision, cueing or coaxing FIM - Locomotion: Ambulation Locomotion: Ambulation Assistive Devices: Administrator Ambulation/Gait Assistance: Not tested (comment) Locomotion: Ambulation: 2: Travels 50 - 149 ft with minimal assistance (Pt.>75%)  Comprehension Comprehension Mode: Auditory Comprehension: 6-Follows complex conversation/direction: With extra time/assistive device  Expression Expression Mode: Verbal Expression: 5-Expresses basic needs/ideas: With no assist  Social Interaction Social Interaction: 5-Interacts appropriately 90% of the time - Needs monitoring or encouragement for participation or interaction.  Problem Solving Problem Solving: 6-Solves complex problems: With extra time  Memory Memory: 5-Recognizes or recalls 90% of the time/requires cueing < 10% of the time  Medical Problem List and Plan: 1. Functional deficits secondary to MG crisis    2. DVT Prophylaxis/Anticoagulation: Pharmaceutical: Xarelto 3. Pain Management: Tylenol prn.  4. Mood: LCSW to follow for support. 5. Neuropsych: This patient is capable of making decisions on her own behalf. 6. Skin/Wound Care: Routine pressure relief measures.  nystatin powder to help with moisture/yeast in periarea.  7. Fluids/Electrolytes/Nutrition:  On regular diet.  Eating well! 8. DM type 2: Was on oral meds at home with good control on Starlix and SSI  -starlix resumed.   -HS snack for am hypoglycemia  -changed to cm moderate diet 12. A fib: Monitor heart rate every 8 hours. Continue amiodarone and Xarelto 13. C diff colitis: Has chronic diarrhea---stools improving  - Continue flagyl with "taper" over 2 weeks  -probiotic/fiber  -on orals solely now  -dc central line   LOS (Days) 8 A FACE TO FACE EVALUATION WAS PERFORMED  SWARTZ,ZACHARY  T 01/30/2015 7:52 AM

## 2015-01-31 ENCOUNTER — Inpatient Hospital Stay (HOSPITAL_COMMUNITY): Payer: PPO | Admitting: Physical Therapy

## 2015-01-31 LAB — GLUCOSE, CAPILLARY
GLUCOSE-CAPILLARY: 196 mg/dL — AB (ref 70–99)
Glucose-Capillary: 99 mg/dL (ref 70–99)
Glucose-Capillary: 88 mg/dL (ref 70–99)
Glucose-Capillary: 185 mg/dL — ABNORMAL HIGH (ref 70–99)

## 2015-01-31 NOTE — Progress Notes (Signed)
Physical Therapy Session Note  Patient Details  Name: Craig [name] MRN: [id number] Date of Birth: 1941/08/10  Today's Date: 01/31/2015 PT Individual Time: 0915-1005 PT Individual Time Calculation (min): 50 min   Short Term Goals: Week 2:  PT Short Term Goal 1 (Week 2): = LTGs  Skilled Therapeutic Interventions/Progress Updates:    Gait Training: PT instructs pt in ambulation with RW x 200' req CGA and verbal cues to stay close to walker - pt demonstrates improved gait endurance and improved distance from RW today compared to other days.  PT instructs pt in ascending/descending 4 stairs with R rail and L WBQC req min A and repeated one-step verbal cues for sequencing - pt significantly SOB after stairs, but vitals measured and SaO2 95% on RA and HR 98 bpm.   Therapeutic Exercise: PT instructs pt in standing B LE strengthening exercises in // bars: squats x 6-10 reps, heel raises x 10 reps: x 3 sets.   Pt is progressing with functional mobility, including gait distance and ability to do stairs. This PT anticipates pt will be able to do stairs at home with [facility] and R rail. Pt continues to demonstrate activity tolerance and balance deficits. Continue per PT POC.   Therapy Documentation Precautions:  Precautions Precautions: Fall Precaution Comments: Monitor O2 Restrictions Weight Bearing Restrictions: No Pain: Pain Assessment Pain Assessment: No/denies pain Pain Score: Asleep Pain Type: Chronic pain Pain Location: Abdomen Pain Orientation: Lower Pain Descriptors / Indicators: Aching Pain Onset: On-going Pain Intervention(s): Medication (See eMAR)  See FIM for current functional status  Therapy/Group: Individual Therapy  Shane Melby M 01/31/2015, 9:41 AM

## 2015-01-31 NOTE — Progress Notes (Signed)
Pt does not wish to wear CPAP/Bipap tonight. No distress noted. Pt encouraged to call RT if pt changes mind.

## 2015-01-31 NOTE — Progress Notes (Signed)
Pt refuses to wear CPAP.  °

## 2015-01-31 NOTE — Progress Notes (Signed)
[place] PHYSICAL MEDICINE & REHABILITATION     PROGRESS NOTE    Subjective/Complaints: Feeling well. No complaints.  Objective: Vital Signs: Blood pressure 114/69, pulse 71, temperature 97.4 F (36.3 C), temperature source Oral, resp. rate 16, weight 103.9 kg (229 lb 0.9 oz), SpO2 93 %. No results found. No results for input(s): WBC, HGB, HCT, PLT in the last 72 hours. No results for input(s): NA, K, CL, GLUCOSE, BUN, CREATININE, CALCIUM in the last 72 hours.  Invalid input(s): CO CBG (last 3)   Recent Labs  01/30/15 1634 01/30/15 2059 01/31/15 0658  GLUCAP 145* 178* 99    Wt Readings from Last 3 Encounters:  01/31/15 103.9 kg (229 lb 0.9 oz)  01/21/15 107.6 kg (237 lb 3.4 oz)  01/08/15 99.791 kg (220 lb)    Physical Exam:   General: Alert and oriented x 3, No apparent distress HEENT: Head is normocephalic, atraumatic, PERRLA, EOMI, sclera anicteric, oral mucosa pink and moist, dentition intact, ext ear canals clear,  Neck: Supple without JVD or lymphadenopathy Heart: Reg rate and rhythm. No murmurs rubs or gallops Chest: CTA bilaterally without wheezes, rales, or rhonchi; no distress Abdomen: Soft, non-tender, non-distended, bowel sounds positive. Extremities: No clubbing, cyanosis, or edema. Pulses are 2+ Skin: breakdown over bridge of nose healing gradually. Scrotum and inguinal area with mild redness. Non tender, moist. Stasis changes bilateral LE's.  Neuro: Pt is cognitively appropriate reasonable insight, memory,  and awareness. resolved ptosis. Improved oral-motor control speech much clearer. Phonation good. Gag stronger.  Sensory exam is normal. Reflexes are 2+ in all 4's. Fine motor coordination is intact. No tremors. Motor function is grossly 4/5 uppers and 3+ to 4/5 lowers. Improved trunk control.  Musculoskeletal: Full ROM, No pain with AROM or PROM in the neck, trunk, or extremities. Posture appropriate Psych: Pt's affect is appropriate. Pt is  cooperative    Assessment/Plan: 1. Functional deficits secondary to myasthenia gravis with crisis which require 3+ hours per day of interdisciplinary therapy in a comprehensive inpatient rehab setting. Physiatrist is providing close team supervision and 24 hour management of active medical problems listed below. Physiatrist and rehab team continue to assess barriers to discharge/monitor patient progress toward functional and medical goals. FIM: FIM - Bathing Bathing Steps Patient Completed: Chest, Right Arm, Left Arm, Abdomen, Right upper leg, Left upper leg, Front perineal area, Left lower leg (including foot), Right lower leg (including foot), Buttocks Bathing: 4: Steadying assist  FIM - Upper Body Dressing/Undressing Upper body dressing/undressing steps patient completed: Thread/unthread right sleeve of pullover shirt/dresss, Thread/unthread left sleeve of pullover shirt/dress, Pull shirt over trunk Upper body dressing/undressing: 4: Min-Patient completed 75 plus % of tasks FIM - Lower Body Dressing/Undressing Lower body dressing/undressing steps patient completed: Thread/unthread right pants leg, Thread/unthread left pants leg, Pull pants up/down, Don/Doff right sock, Don/Doff left sock Lower body dressing/undressing: 3: Mod-Patient completed 50-74% of tasks  FIM - Toileting Toileting steps completed by patient: Adjust clothing prior to toileting Toileting Assistive Devices: Grab bar or rail for support Toileting: 1: Total-Patient completed zero steps, helper did all 3  FIM - Radio producer Devices: Insurance account manager Transfers: 5-To toilet/BSC: Supervision (verbal cues/safety issues), 5-From toilet/BSC: Supervision (verbal cues/safety issues)  FIM - Control and instrumentation engineer Devices: Walker, Arm rests Bed/Chair Transfer: 4: Bed > Chair or W/C: Min A (steadying Pt. > 75%), 4: Chair or W/C > Bed: Min A (steadying Pt. > 75%)  FIM -  Locomotion: Wheelchair Distance: 150 Locomotion: Wheelchair:  5: Travels 150 ft or more: maneuvers on rugs and over door sills with supervision, cueing or coaxing FIM - Locomotion: Ambulation Locomotion: Ambulation Assistive Devices: Administrator Ambulation/Gait Assistance: 4: Min guard Locomotion: Ambulation: 2: Travels 50 - 149 ft with minimal assistance (Pt.>75%)  Comprehension Comprehension Mode: Auditory Comprehension: 5-Follows basic conversation/direction: With extra time/assistive device  Expression Expression Mode: Verbal Expression: 6-Expresses complex ideas: With extra time/assistive device  Social Interaction Social Interaction: 5-Interacts appropriately 90% of the time - Needs monitoring or encouragement for participation or interaction.  Problem Solving Problem Solving: 5-Solves basic 90% of the time/requires cueing < 10% of the time  Memory Memory: 5-Recognizes or recalls 90% of the time/requires cueing < 10% of the time  Medical Problem List and Plan: 1. Functional deficits secondary to MG crisis    2. DVT Prophylaxis/Anticoagulation: Pharmaceutical: Xarelto 3. Pain Management: Tylenol prn.  4. Mood: LCSW to follow for support. 5. Neuropsych: This patient is capable of making decisions on her own behalf. 6. Skin/Wound Care: Routine pressure relief measures.  nystatin powder to help with moisture/yeast in periarea.  7. Fluids/Electrolytes/Nutrition:  On regular diet.  Eating well! 8. DM type 2: Was on oral meds at home with good control on Starlix and SSI  -starlix resumed.   -HS snack for am hypoglycemia  -changed to cm moderate diet  -sugars improving 12. A fib: Monitor heart rate every 8 hours. Continue amiodarone and Xarelto 13. C diff colitis: stool consistency better  - Continue flagyl with "taper" over 2 weeks  -probiotic/fiber  -on orals solely now      LOS (Days) 9 A FACE TO FACE EVALUATION WAS PERFORMED  SWARTZ,ZACHARY T 01/31/2015  8:39 AM

## 2015-02-01 ENCOUNTER — Inpatient Hospital Stay (HOSPITAL_COMMUNITY): Payer: PPO | Admitting: Physical Therapy

## 2015-02-01 DIAGNOSIS — I482 Chronic atrial fibrillation: Secondary | ICD-10-CM

## 2015-02-01 LAB — GLUCOSE, CAPILLARY
GLUCOSE-CAPILLARY: 121 mg/dL — AB (ref 70–99)
GLUCOSE-CAPILLARY: 92 mg/dL (ref 70–99)
GLUCOSE-CAPILLARY: 177 mg/dL — AB (ref 70–99)
GLUCOSE-CAPILLARY: 184 mg/dL — AB (ref 70–99)

## 2015-02-01 MED ORDER — FAMOTIDINE 40 MG PO TABS
40.0000 mg | ORAL_TABLET | Freq: Every day | ORAL | Status: DC
  Administered 2015-02-02 – 2015-02-03 (×2): 40 mg via ORAL
  Filled 2015-02-01 (×4): qty 1

## 2015-02-01 MED ORDER — AMIODARONE HCL 100 MG PO TABS
100.0000 mg | ORAL_TABLET | Freq: Every day | ORAL | Status: DC
  Administered 2015-02-02 – 2015-02-03 (×2): 100 mg via ORAL
  Filled 2015-02-01 (×3): qty 1

## 2015-02-01 MED ORDER — PREDNISONE 50 MG PO TABS
60.0000 mg | ORAL_TABLET | Freq: Every day | ORAL | Status: DC
  Administered 2015-02-02 – 2015-02-03 (×2): 60 mg via ORAL
  Filled 2015-02-01 (×3): qty 1

## 2015-02-01 NOTE — Progress Notes (Signed)
Physical Therapy Session Note  Patient Details  Name: Craig [name] MRN: [id number] Date of Birth: July 11, 1941  Today's Date: 02/01/2015 PT Individual Time: 1300-1345 PT Individual Time Calculation (min): 45 min   Short Term Goals: Week 1:  PT Short Term Goal 1 (Week 1): Pt will be able to perform bed mobility with min A PT Short Term Goal 1 - Progress (Week 1): Met PT Short Term Goal 2 (Week 1): Pt will be able to perform functional transfers consistently with min A PT Short Term Goal 2 - Progress (Week 1): Met PT Short Term Goal 3 (Week 1): Pt will be able to gait x 25' with min A with LRAD PT Short Term Goal 3 - Progress (Week 1): Met PT Short Term Goal 4 (Week 1): Pt will be able to go up/down 3 steps with R rail for home entry with mod A PT Short Term Goal 4 - Progress (Week 1): Met  Skilled Therapeutic Interventions/Progress Updates:  Pt was seen bedside in the pm. Pt transferred sit to stand with min guard, ambulated about 10 feet to w/c with rolling walker and min guard. Following brief rest break, pt ambulated 110 and 100 feet with rolling walker and S to min guard with occasional verbal cues. Pt performed multiple sit to stand transfers with min guard and verbal cues with rolling walker. Pt ascended 3 stairs with R rail and SPC x 2 and descended 3 stairs backwards with R rail and SPC x 2 with min A and verbal cues. Pt with increased work of breathing following stairs. Pt had difficulty when attempting 4 consecutive steps secondary to fatigue and design of railing. Pt performed step ups and cone taps, 3 sets x10 reps for LE strengthening. Pt ambulated 130 feet with rolling walker and S to min guard. Pt returned to room. Pt transferred w/c to recliner with rolling walker and min guard.   Therapy Documentation Precautions:  Precautions Precautions: Fall Precaution Comments: Monitor O2 Restrictions Weight Bearing Restrictions: No General:   Pain: No c/o pain.    Locomotion  : Ambulation Ambulation/Gait Assistance: 4: Min guard;5: Supervision   See FIM for current functional status  Therapy/Group: Individual Therapy  [name] 02/01/2015, 2:50 PM

## 2015-02-01 NOTE — Progress Notes (Signed)
[place] PHYSICAL MEDICINE & REHABILITATION     PROGRESS NOTE    Subjective/Complaints: No complaints. Vigorous appetite. No sob, cough, fever  Objective: Vital Signs: Blood pressure 120/77, pulse 61, temperature 97.7 F (36.5 C), temperature source Oral, resp. rate 17, weight 102.8 kg (226 lb 10.1 oz), SpO2 97 %. No results found. No results for input(s): WBC, HGB, HCT, PLT in the last 72 hours. No results for input(s): NA, K, CL, GLUCOSE, BUN, CREATININE, CALCIUM in the last 72 hours.  Invalid input(s): CO CBG (last 3)   Recent Labs  01/31/15 1645 01/31/15 2050 02/01/15 0641  GLUCAP 185* 196* 121*    Wt Readings from Last 3 Encounters:  02/01/15 102.8 kg (226 lb 10.1 oz)  01/21/15 107.6 kg (237 lb 3.4 oz)  01/08/15 99.791 kg (220 lb)    Physical Exam:   General: Alert and oriented x 3, No apparent distress HEENT: Head is normocephalic, atraumatic, PERRLA, EOMI, sclera anicteric, oral mucosa pink and moist, dentition intact, ext ear canals clear,  Neck: Supple without JVD or lymphadenopathy Heart: Reg rate and rhythm. No murmurs rubs or gallops Chest: CTA bilaterally without wheezes, rales, or rhonchi; no distress Abdomen: Soft, non-tender, non-distended, bowel sounds positive. Extremities: No clubbing, cyanosis, or edema. Pulses are 2+ Skin: breakdown over bridge of nose healing gradually. Scrotum and inguinal area with mild redness. Non tender, moist. Stasis changes bilateral LE's.  Neuro: Pt is cognitively appropriate reasonable insight, memory,  and awareness. resolved ptosis. Improved oral-motor control speech much clearer. Phonation good. Gag stronger.  Sensory exam is normal. Reflexes are 2+ in all 4's. Fine motor coordination is intact. No tremors. Motor function is grossly 4/5 uppers and 3+ to 4/5 lowers. Improved trunk control.  Musculoskeletal: Full ROM, No pain with AROM or PROM in the neck, trunk, or extremities. Posture appropriate Psych: Pt's affect is  appropriate. Pt is cooperative    Assessment/Plan: 1. Functional deficits secondary to myasthenia gravis with crisis which require 3+ hours per day of interdisciplinary therapy in a comprehensive inpatient rehab setting. Physiatrist is providing close team supervision and 24 hour management of active medical problems listed below. Physiatrist and rehab team continue to assess barriers to discharge/monitor patient progress toward functional and medical goals.  Dc 3/22. Can go tomorrow from my standpoint if therapy is complete.   FIM: FIM - Bathing Bathing Steps Patient Completed: Chest, Right Arm, Left Arm, Abdomen, Right upper leg, Left upper leg, Front perineal area, Left lower leg (including foot), Right lower leg (including foot), Buttocks Bathing: 4: Steadying assist  FIM - Upper Body Dressing/Undressing Upper body dressing/undressing steps patient completed: Thread/unthread right sleeve of pullover shirt/dresss, Thread/unthread left sleeve of pullover shirt/dress, Pull shirt over trunk Upper body dressing/undressing: 4: Min-Patient completed 75 plus % of tasks FIM - Lower Body Dressing/Undressing Lower body dressing/undressing steps patient completed: Thread/unthread right pants leg, Thread/unthread left pants leg, Pull pants up/down, Don/Doff right sock, Don/Doff left sock Lower body dressing/undressing: 3: Mod-Patient completed 50-74% of tasks  FIM - Toileting Toileting steps completed by patient: Adjust clothing prior to toileting Toileting Assistive Devices: Grab bar or rail for support Toileting: 1: Total-Patient completed zero steps, helper did all 3  FIM - Radio producer Devices: Insurance account manager Transfers: 5-To toilet/BSC: Supervision (verbal cues/safety issues), 5-From toilet/BSC: Supervision (verbal cues/safety issues)  FIM - Control and instrumentation engineer Devices: Walker, Arm rests Bed/Chair Transfer: 4: Bed > Chair or W/C:  Min A (steadying Pt. > 75%), 4: Chair  or W/C > Bed: Min A (steadying Pt. > 75%)  FIM - Locomotion: Wheelchair Distance: 200 Locomotion: Wheelchair: 5: Travels 150 ft or more: maneuvers on rugs and over door sills with supervision, cueing or coaxing FIM - Locomotion: Ambulation Locomotion: Ambulation Assistive Devices: Administrator Ambulation/Gait Assistance: 4: Min guard Locomotion: Ambulation: 4: Travels 150 ft or more with minimal assistance (Pt.>75%)  Comprehension Comprehension Mode: Auditory Comprehension: 5-Follows basic conversation/direction: With extra time/assistive device  Expression Expression Mode: Verbal Expression: 6-Expresses complex ideas: With extra time/assistive device  Social Interaction Social Interaction: 5-Interacts appropriately 90% of the time - Needs monitoring or encouragement for participation or interaction.  Problem Solving Problem Solving: 5-Solves basic 90% of the time/requires cueing < 10% of the time  Memory Memory: 5-Recognizes or recalls 90% of the time/requires cueing < 10% of the time  Medical Problem List and Plan: 1. Functional deficits secondary to MG crisis    2. DVT Prophylaxis/Anticoagulation: Pharmaceutical: Xarelto 3. Pain Management: Tylenol prn.  4. Mood: LCSW to follow for support. 5. Neuropsych: This patient is capable of making decisions on her own behalf. 6. Skin/Wound Care: Routine pressure relief measures.  nystatin powder to help with moisture/yeast in periarea.  7. Fluids/Electrolytes/Nutrition:  On regular diet.  Eating well! 8. DM type 2: Was on oral meds at home with good control on Starlix and SSI  -starlix resumed.   -HS snack for am hypoglycemia  -changed to cm moderate diet  -sugars improved 12. A fib: Monitor heart rate every 8 hours. Continue amiodarone and Xarelto 13. C diff colitis: stool consistency better  - Continue flagyl with "taper"---essentially 1 week left  -probiotic/fiber  -on orals  solely now      LOS (Days) 10 A FACE TO FACE EVALUATION WAS PERFORMED  Craig [name] 02/01/2015 8:03 AM

## 2015-02-02 ENCOUNTER — Inpatient Hospital Stay (HOSPITAL_COMMUNITY): Payer: PPO

## 2015-02-02 ENCOUNTER — Telehealth: Payer: Self-pay | Admitting: Neurology

## 2015-02-02 ENCOUNTER — Inpatient Hospital Stay (HOSPITAL_COMMUNITY): Payer: PPO | Admitting: Occupational Therapy

## 2015-02-02 ENCOUNTER — Inpatient Hospital Stay (HOSPITAL_COMMUNITY): Payer: PPO | Admitting: Speech Pathology

## 2015-02-02 LAB — GLUCOSE, CAPILLARY
GLUCOSE-CAPILLARY: 151 mg/dL — AB (ref 70–99)
GLUCOSE-CAPILLARY: 162 mg/dL — AB (ref 70–99)
Glucose-Capillary: 102 mg/dL — ABNORMAL HIGH (ref 70–99)
Glucose-Capillary: 96 mg/dL (ref 70–99)

## 2015-02-02 NOTE — Progress Notes (Signed)
Physical Therapy Discharge Summary  Patient Details  Name: Craig [name] MRN: [id number] Date of Birth: 1941/03/03  Patient has met 8 of 8 long term goals due to improved activity tolerance, improved balance, improved postural control, increased strength and decreased pain.  Patient to discharge at a household ambulatory level Supervision using RW for gait. Pt requires min A for stair negotiation using R rail and SPC on L.  Patient's wife is independent to provide the necessary physical and supervision assistance at discharge.  Reasons goals not met: n/a - all goals met at this time.  Recommendation:  Patient will benefit from ongoing skilled PT services in home health setting to continue to advance safe functional mobility, address ongoing impairments in gait, balance, endurance, functional strength, stairs, and minimize fall risk.  Equipment: No equipment provided. Pt already owns personal RW and SPC.  Reasons for discharge: treatment goals met and discharge from hospital  Patient/family agrees with progress made and goals achieved: Yes  PT Discharge Precautions/Restrictions Precautions Precautions: Fall Restrictions Weight Bearing Restrictions: No Cognition Overall Cognitive Status: Within Functional Limits for tasks assessed Comments: Still presents with flat affect and requires encouragement in therapy. Sensation Sensation Light Touch: Appears Intact Coordination Gross Motor Movements are Fluid and Coordinated: Yes Motor  Motor Motor: Abnormal postural alignment and control Motor - Discharge Observations: Posture improving though still presents with flexion bias and requires cues for upright posture with mobiilty.  Locomotion  Ambulation Ambulation/Gait Assistance: 5: Supervision Gait Gait velocity: Avg of 3 trials = 1.93 ft/sec  Trunk/Postural Assessment  Cervical Assessment Cervical Assessment: Exceptions to [facility] Cervical Strength Overall Cervical Strength Comments:  maintains forwards head Thoracic Assessment Thoracic Assessment: Exceptions to [facility] Thoracic Strength Overall Thoracic Strength Comments: flexed posture Lumbar Assessment Lumbar Assessment: Exceptions to [facility] (posterior pelvic tilt) Postural Control Postural Limitations: maintains flexed posture with cues needed to address  Balance Balance Balance Assessed: Yes Static Sitting Balance Static Sitting - Level of Assistance: 6: Modified independent (Device/Increase time) Dynamic Sitting Balance Dynamic Sitting - Level of Assistance: 6: Modified independent (Device/Increase time) Static Standing Balance Static Standing - Level of Assistance: 5: Stand by assistance Dynamic Standing Balance Dynamic Standing - Level of Assistance: 5: Stand by assistance Extremity Assessment      RLE Assessment RLE Assessment: Exceptions to [facility] (dec muscular endurance; grossly 3+ to 4-/5) LLE Assessment LLE Assessment: Exceptions to [facility] (dec muscular endurance; grossly 3+ to 4-/5)  See FIM for current functional status  [name], PT, DPT  02/02/2015, 3:01 PM

## 2015-02-02 NOTE — Plan of Care (Signed)
Problem: SCI BOWEL ELIMINATION Goal: RH STG MANAGE BOWEL WITH ASSISTANCE STG Manage Bowel with Assistance. Min A  Outcome: Not Met (add Reason) Pt incontinent at noc, per report

## 2015-02-02 NOTE — Progress Notes (Signed)
Occupational Therapy Discharge Summary  Patient Details  Name: Craig [name] MRN: [id number] Date of Birth: 08/08/1941  Today's Date: 02/02/2015 OT Individual Time: 0930-1030 OT Individual Time Calculation (min): 60 min    Patient has met 9 of 9 long term goals due to improved activity tolerance, improved balance and ability to compensate for deficits.  Patient to discharge at overall Supervision level.  Patient's care partner is independent to provide the necessary physical assistance at discharge.    Extensive education provided to pt and caregiver throughout tx stay regarding safety with functional mobility and importance of participation/ independence with ADLs upon d/c.  All patient and caregiver questions answered throughout stay.   Recommendation:  Patient with no further OT needs.   Equipment: No equipment provided  Reasons for discharge: treatment goals met and discharge from hospital  Patient/family agrees with progress made and goals achieved: Yes   OT Skilled Session: Pt seen for OT ADL bathing and dressing session. Pt sitting up in recliner upon arrival, agreeable to tx. Pt ambulated throughout room with RW and supervision. Pt walked into bathroom and completed transfers onto/ off of toilet and  toileting task with supervision. Pt walked into shower and completed showering task seated on shower chair with supervision. Pt with decreased functional endurance, requiring seated rest breaks throughout bathing and dressing task. Pt walked out of shower with RW to w/c for dressing task. Pt dressed UB with set-up and increased time. Pt able to thread B LEs into pants and pull pants up while standing at the sink with supervision. Barrier cream applied to pt's sacrum. Pt returned to recliner and UE theraband exercises reviewed with pt. Pt demonstrated exercises with OT provided cues for proper technique and positioning. Pt left in recliner with all needs in reach and wife present.   Education provided to pt and caregiver regarding d/c planning, DME, benefits of participation with ADLS, UE ROM and strengthening exercises, donning lotion distal to proximal for edema management.   OT Discharge Precautions/Restrictions  Precautions Precautions: Fall Restrictions Weight Bearing Restrictions: No Pain Pain Assessment Pain Score: 0-No pain Vision/Perception  Vision- History Baseline Vision/History: Wears glasses Wears Glasses: Reading only Patient Visual Report: No change from baseline Vision- Assessment Vision Assessment?: No apparent visual deficits  Cognition Overall Cognitive Status: Within Functional Limits for tasks assessed Arousal/Alertness: Awake/alert Orientation Level: Oriented X4 Attention: Focused Focused Attention: Appears intact Memory: Appears intact Awareness: Appears intact Problem Solving: Appears intact Safety/Judgment: Appears intact Comments: Pt with flat affect; requires encourgement for participation in therapy Sensation Sensation Light Touch: Appears Intact Proprioception: Appears Intact Coordination Gross Motor Movements are Fluid and Coordinated: Yes Fine Motor Movements are Fluid and Coordinated: Yes Motor  Motor Motor: Abnormal postural alignment and control Motor - Skilled Clinical Observations: generalized weakness proximal > distal Motor - Discharge Observations: Pt with forward flexion in standing/ sitting, corrected with cues for upright sitting, improvced since eval Mobility  Bed Mobility Bed Mobility: Right Sidelying to Sit;Sit to Supine;Sit to Sidelying Right Right Sidelying to Sit: 5: Supervision Right Sidelying to Sit Details: Visual cues/gestures for sequencing Supine to Sit: 5: Supervision Supine to Sit Details: Verbal cues for sequencing Sit to Supine: 5: Supervision Sit to Supine - Details: Visual cues/gestures for precautions/safety Sit to Sidelying Right: 5: Supervision Sit to Sidelying Right Details:  Visual cues/gestures for sequencing Transfers Transfers: Sit to Stand;Stand to Sit Sit to Stand: 5: Supervision Stand to Sit: 5: Supervision  Trunk/Postural Assessment  Cervical Assessment Cervical Assessment: Exceptions to [facility]  Cervical Strength Overall Cervical Strength Comments: maintains forwards head Thoracic Assessment Thoracic Assessment: Exceptions to [facility] Thoracic Strength Overall Thoracic Strength Comments: flexed posture Lumbar Assessment Lumbar Assessment: Exceptions to [facility] Postural Control Postural Control: Deficits on evaluation Postural Limitations: maintains flexed posture with cues needed to address  Balance Balance Balance Assessed: Yes Static Sitting Balance Static Sitting - Level of Assistance: 6: Modified independent (Device/Increase time) Dynamic Sitting Balance Dynamic Sitting - Balance Support: Feet supported;During functional activity Dynamic Sitting - Level of Assistance: 6: Modified independent (Device/Increase time) Dynamic Sitting Balance - Compensations: Mod I Supported in w/c. Supervision for dynamic sitting in unsupported chair (i.e. EOB, shower chair) Dynamic Sitting - Balance Activities: Lateral lean/weight shifting;Forward lean/weight shifting;Reaching across midline;Reaching for objects Sitting balance - Comments: pt able to wt shift in sitting without UE support and no LOB during functioanl bathing task Static Standing Balance Static Standing - Balance Support: During functional activity Static Standing - Level of Assistance: 5: Stand by assistance Static Standing - Comment/# of Minutes: Standing to complete grooming task at the sink Dynamic Standing Balance Dynamic Standing - Level of Assistance: 5: Stand by assistance Extremity/Trunk Assessment RUE Assessment RUE Assessment: Within Functional Limits RUE AROM (degrees) RUE Overall AROM Comments: Pt able to reach ~90 degrees shoulder flexion RUE Strength RUE Overall Strength Comments:  grossly 4-/5 LUE Assessment LUE Assessment: Exceptions to WFL LUE AROM (degrees) LUE Overall AROM Comments: ~80 degrees shoulder flexion LUE Strength LUE Overall Strength Comments: grossly 4-/5  See FIM for current functional status  Athel, Paton Crum C 02/02/2015, 12:55 PM

## 2015-02-02 NOTE — Telephone Encounter (Signed)
I have talked with PA Olin Hauser, since admission and of February 2016, he has got plasma exchange,also 5 days of ivig, has improved, he can eat by mouth now, planning on to be discharged March 22nd 2016  Michelle: Please call back PA, at 3 [phone] 2, [phone]  0, give him a follow-up appointment one week after discharge.

## 2015-02-02 NOTE — Discharge Summary (Signed)
Physician Discharge Summary  Patient ID: Craig [name] MRN: [id number] DOB/AGE: July 14, 1941 74 y.o.  Admit date: 01/22/2015 Discharge date: 02/03/2015  Discharge Diagnoses:  Principal Problem:   MG, crisis (myasthenia gravis) Active Problems:   A-fib   Enteritis due to Clostridium difficile   Obstructive sleep apnea   Discharged Condition: Stable    Labs:  Basic Metabolic Panel:    Component Value Date/Time   NA 142 01/23/2015 0608   K 3.5 01/23/2015 0608   CL 104 01/23/2015 0608   CO2 32 01/23/2015 0608   GLUCOSE 156* 01/23/2015 0608   BUN 16 01/23/2015 0608   CREATININE 0.60 01/23/2015 0608   CALCIUM 8.4 01/23/2015 0608   GFRNONAA >90 01/23/2015 0608   GFRAA >90 01/23/2015 0608     CBC: CBC Latest Ref Rng 01/23/2015 01/21/2015 01/20/2015  WBC 4.0 - 10.5 K/uL 9.7 13.2(H) -  Hemoglobin 13.0 - 17.0 g/dL 10.9(L) 13.2 13.3  Hematocrit 39.0 - 52.0 % 33.4(L) 40.4 39.0  Platelets 150 - 400 K/uL 171 190 -     CBG:  Recent Labs Lab 02/02/15 0642 02/02/15 1132 02/02/15 1637 02/02/15 2046 02/03/15 0608  GLUCAP 102* 96 151* 162* 90     Brief HPI:   Craig [name] is a 74 y.o. male with history of AFib, bladder cancer, DM type 2, MG who was discharged to home past CIR on 01/02/15. He was seen by outpatient neurologist on 02/26 with plans to start home IVIG but was readmitted on 01/10/15 with fatigue,worsening of SOB and hypoxia. He was treated with IVIG X 1 dose but has worsening of respiratory status with dyspnea therefore was treated with  plasmapheresis X 5 doses with improvement in strength as well as swallow function. CCM consulted for close monitoring of respiratory status due to concerns of requiring intubation and he was started on IV zosyn for PNA. He developed diarrhea with recurrent c diff and was started on metronidazole on 03/05. He has had improvement in swallow function and was  started on regular diet on 03/08. Patient was showing improvement in activity  tolerance and CIR was recommended by MD and Rehab team.    Hospital Course: [name] was admitted to rehab 01/22/2015 for inpatient therapies to consist of PT, ST and OT at least three hours five days a week. Past admission physiatrist, therapy team and rehab RN have worked together to provide customized collaborative inpatient rehab. His po intake has been good and tube feeds were discontinued. Diarrhea has resolved and he was slowly tapered off flagyl. Diabetes has been monitored with ac/hs checks and blood sugars have been controlled on starlix.  Heart rate has been monitored every 8 hours and has been controlled. Hypernatremia as well as reactive leucocytosis has resolved per admission labs. He has been afebrile and no signs of infection noted. Respiratory status has been stable but he has not been compliant with CPAP use due to misplacement of his personal mask.  He has had increase in lower extremity  edema and is to resume home dose lasix and kdur at discharge. Mood has been stable and he has shown good motivation during his rehab stay. Endurance is improving and patient was at supervision level at discharge. He will continue to receive follow up HHPT and [place] [facility] past discharge.    Rehab course: During patient's stay in rehab weekly team conferences were held to monitor patient's progress, set goals and discuss barriers to discharge. At admission, patient requires mod to total assist  for mobility and moderate assist with basic self care tasks. He has had improvement in activity tolerance, balance, postural control, as well as ability to compensate for deficits. He is tolerating dysphagia 3 diet/thin liquids with increased time to recall and utilize safe swallow strategies. He is able to complete bathing and dressing tasks with supervision. He requires supervision for transfers and is able to ambulate 140' with RW.  He requires min assist to navigate stairs. Wife has been  supportive and present for multiple therapy sessions. Family education was done regarding safe swallow strategies as well as supervision needed with mobility and ADL tasks.     Disposition: Home  Diet: Diabetic.  Special Instructions: 1. Check blood sugars 2-3 times a day and record.      Discharge Instructions    AMB Referral to [facility]    Complete by:  As directed   Reason for consult:  thn eval  Patient active with [facility].     Comm to Care Management    Complete by:  As directed   Patient active with [facility] services. To be followed post discharge.            Medication List    STOP taking these medications        antiseptic oral rinse 0.05 % Liqd solution  Commonly known as:  CPC / CETYLPYRIDINIUM CHLORIDE 0.05%     cephALEXin 250 MG/5ML suspension  Commonly known as:  KEFLEX     chlorhexidine 0.12 % solution  Commonly known as:  PERIDEX     feeding supplement (JEVITY 1.2 CAL) Liqd     feeding supplement (PRO-STAT SUGAR FREE 64) Liqd     insulin glargine 100 UNIT/ML injection  Commonly known as:  LANTUS     ipratropium 0.02 % nebulizer solution  Commonly known as:  ATROVENT     metoprolol tartrate 25 MG tablet  Commonly known as:  LOPRESSOR     ondansetron 4 MG tablet  Commonly known as:  ZOFRAN     potassium chloride 20 MEQ/15ML (10%) Soln     senna 8.6 MG Tabs tablet  Commonly known as:  SENOKOT      TAKE these medications        amiodarone 100 MG tablet  Commonly known as:  PACERONE  Take 1 tablet (100 mg total) by mouth daily.     artificial tears Oint ophthalmic ointment  Place into both eyes every 4 (four) hours as needed for dry eyes.     atorvastatin 40 MG tablet  Commonly known as:  LIPITOR  Place 1 tablet (40 mg total) into feeding tube daily.     famotidine 40 MG/5ML suspension  Commonly known as:  PEPCID  Take 5 mLs (40 mg total) by mouth daily.     free water Soln  Place 50 mLs into  feeding tube daily.     FREESTYLE LITE Devi  Take by mouth 4 (four) times daily.     FREESTYLE LITE test strip  Generic drug:  glucose blood  4 (four) times daily. as directed     hydrocerin Crea  Apply 1 application topically 2 (two) times daily.     insulin aspart 100 UNIT/ML FlexPen  Commonly known as:  NOVOLOG FLEXPEN  Inject 0-10 Units into the skin 4 (four) times daily. Use according to scale prior to tube feeds.     ipratropium 17 MCG/ACT inhaler  Commonly known as:  ATROVENT HFA  Inhale  1 puff into the lungs every 6 (six) hours as needed for wheezing.     metroNIDAZOLE 500 MG tablet  Commonly known as:  FLAGYL  Take 1 tablet (500 mg total) by mouth every 12 (twelve) hours. For 6 more days. Then decrease to one pill daily till gone.     mupirocin ointment 2 %  Commonly known as:  BACTROBAN  Apply topically daily. Apply to bridge of nose wound daily     nateglinide 120 MG tablet  Commonly known as:  STARLIX  Take 1 tablet (120 mg total) by mouth 3 (three) times daily.     nystatin 100000 UNIT/GM Powd  Apply 1 g topically 3 (three) times daily.     polycarbophil 625 MG tablet  Commonly known as:  FIBERCON  Take 1 tablet (625 mg total) by mouth daily.     predniSONE 20 MG tablet  Commonly known as:  DELTASONE  Place 3 tablets (60 mg total) into feeding tube daily with breakfast.     pyridostigmine 60 MG tablet  Commonly known as:  MESTINON  1 tablet (60 mg total) by Per NG tube route 4 (four) times daily.     rivaroxaban 20 MG Tabs tablet  Commonly known as:  XARELTO  Take 1 tablet (20 mg total) by mouth daily with supper.     saccharomyces boulardii 250 MG capsule  Commonly known as:  FLORASTOR  Take 1 capsule (250 mg total) by mouth 2 (two) times daily.     traMADol 50 MG tablet--Rx# 90 pills  Commonly known as:  ULTRAM  Take 1 tablet (50 mg total) by mouth every 6 (six) hours as needed for moderate pain.       Follow-up Information    Follow up  with Ann Held, MD On 02/13/2015.   Specialty:  Family Medicine   Why:  @ 10:30 AM   Contact information:   [place] [id number] [phone]       Follow up with Meredith Staggers, MD On 03/11/2015.   Specialty:  Physical Medicine and Rehabilitation   Why:  Be there at 11:30 am for 12 noon appointment   Contact information:   [address] [place] [address] [phone]       Follow up with Marcial Pacas, MD On 02/16/2015.   Specialty:  Neurology   Why:  Be there at 8:45 for 9 am appointment   Contact information:   [place] [address] [phone]       Signed: Bary Leriche 02/03/2015, 9:20 AM

## 2015-02-02 NOTE — Progress Notes (Signed)
At 2130, incontinent of large amount of urine. Max assist with clean up and hygiene. Condom cath applied for HS. Bacitracin applied and Bandaid changed. Bottom of feet with areas of peeling skin. BLE's discolored, feet cool to touch, +1 PP's bilaterally. BLE's with pitting edema. Barrier cream applied to sacrum. Encouraged patient to boost/lean side to  While sitting in recliner. Craig [name] A

## 2015-02-02 NOTE — Telephone Encounter (Signed)
Reesa Chew, PA at [facility] would like to speak with you regarding pt. He was admitted to ER and has completed treatment for 5 days for Myasthenia Gravis.  She would like to consult with you before discharging pt.  Please call her on her cell @ [phone].

## 2015-02-02 NOTE — Progress Notes (Signed)
Pt refuses to wear CPAP and has been refusing for the previous nights. No distress noted. Pt encouraged to call RT if pt changes mind.

## 2015-02-02 NOTE — Progress Notes (Signed)
[facility]     PROGRESS NOTE    Subjective/Complaints: No complaints. Feeling well. Uneventful night Objective: Vital Signs: Blood pressure 132/74, pulse 78, temperature 97.3 F (36.3 C), temperature source Axillary, resp. rate 16, weight 103.6 kg (228 lb 6.3 oz), SpO2 96 %. No results found. No results for input(s): WBC, HGB, HCT, PLT in the last 72 hours. No results for input(s): NA, K, CL, GLUCOSE, BUN, CREATININE, CALCIUM in the last 72 hours.  Invalid input(s): CO CBG (last 3)   Recent Labs  02/01/15 1637 02/01/15 2138 02/02/15 0642  GLUCAP 177* 184* 102*    Wt Readings from Last 3 Encounters:  02/02/15 103.6 kg (228 lb 6.3 oz)  01/21/15 107.6 kg (237 lb 3.4 oz)  01/08/15 99.791 kg (220 lb)    Physical Exam:   General: Alert and oriented x 3, No apparent distress HEENT: Head is normocephalic, atraumatic, PERRLA, EOMI, sclera anicteric, oral mucosa pink and moist, dentition intact, ext ear canals clear,  Neck: Supple without JVD or lymphadenopathy Heart: Reg rate and rhythm. No murmurs rubs or gallops Chest: CTA bilaterally without wheezes, rales, or rhonchi; no distress Abdomen: Soft, non-tender, non-distended, bowel sounds positive. Extremities: No clubbing, cyanosis, or edema. Pulses are 2+ Skin: breakdown over bridge of nose healing nicely. Scrotum and inguinal area with mild redness. Non tender, moist. Stasis changes bilateral LE's.  Neuro: Pt is cognitively appropriate reasonable insight, memory,  and awareness. resolved ptosis. Improved oral-motor control speech much clearer. Phonation good.   Sensory exam is normal. Reflexes are 2+ in all 4's. Fine motor coordination is intact. No tremors. Motor function is grossly 4+/5 uppers and  4+/5 lowers. Improved trunk control.  Musculoskeletal: Full ROM, No pain with AROM or PROM in the neck, trunk, or extremities. Posture appropriate Psych: Pt's affect is appropriate. Pt is  cooperative    Assessment/Plan: 1. Functional deficits secondary to myasthenia gravis with crisis which require 3+ hours per day of interdisciplinary therapy in a comprehensive inpatient rehab setting. Physiatrist is providing close team supervision and 24 hour management of active medical problems listed below. Physiatrist and rehab team continue to assess barriers to discharge/monitor patient progress toward functional and medical goals.  Dc 3/22. Can go tomorrow from my standpoint if therapy is complete.   FIM: FIM - Bathing Bathing Steps Patient Completed: Chest, Right Arm, Left Arm, Abdomen, Right upper leg, Left upper leg, Front perineal area, Left lower leg (including foot), Right lower leg (including foot), Buttocks Bathing: 4: Steadying assist  FIM - Upper Body Dressing/Undressing Upper body dressing/undressing steps patient completed: Thread/unthread right sleeve of pullover shirt/dresss, Thread/unthread left sleeve of pullover shirt/dress, Pull shirt over trunk Upper body dressing/undressing: 4: Min-Patient completed 75 plus % of tasks FIM - Lower Body Dressing/Undressing Lower body dressing/undressing steps patient completed: Thread/unthread right pants leg, Thread/unthread left pants leg, Pull pants up/down, Don/Doff right sock, Don/Doff left sock Lower body dressing/undressing: 3: Mod-Patient completed 50-74% of tasks  FIM - Toileting Toileting steps completed by patient: Adjust clothing prior to toileting Toileting Assistive Devices: Grab bar or rail for support Toileting: 1: Total-Patient completed zero steps, helper did all 3  FIM - Radio producer Devices: Insurance account manager Transfers: 5-To toilet/BSC: Supervision (verbal cues/safety issues), 5-From toilet/BSC: Supervision (verbal cues/safety issues)  FIM - Control and instrumentation engineer Devices: Walker, Arm rests Bed/Chair Transfer: 4: Bed > Chair or W/C: Min A (steadying  Pt. > 75%), 4: Chair or W/C > Bed: Min  A (steadying Pt. > 75%)  FIM - Locomotion: Wheelchair Distance: 200 Locomotion: Wheelchair: 5: Travels 150 ft or more: maneuvers on rugs and over door sills with supervision, cueing or coaxing FIM - Locomotion: Ambulation Locomotion: Ambulation Assistive Devices: Administrator Ambulation/Gait Assistance: 4: Min guard, 5: Supervision Locomotion: Ambulation: 2: Travels 50 - 149 ft with minimal assistance (Pt.>75%)  Comprehension Comprehension Mode: Auditory Comprehension: 5-Follows basic conversation/direction: With extra time/assistive device  Expression Expression Mode: Verbal Expression: 6-Expresses complex ideas: With extra time/assistive device  Social Interaction Social Interaction: 5-Interacts appropriately 90% of the time - Needs monitoring or encouragement for participation or interaction.  Problem Solving Problem Solving: 5-Solves basic 90% of the time/requires cueing < 10% of the time  Memory Memory: 5-Recognizes or recalls 90% of the time/requires cueing < 10% of the time  Medical Problem List and Plan: 1. Functional deficits secondary to MG crisis    2. DVT Prophylaxis/Anticoagulation: Pharmaceutical: Xarelto 3. Pain Management: Tylenol prn.  4. Mood: LCSW to follow for support. 5. Neuropsych: This patient is capable of making decisions on her own behalf. 6. Skin/Wound Care: Routine pressure relief measures.  nystatin powder to help with moisture/yeast in periarea.  7. Fluids/Electrolytes/Nutrition:  On regular diet.  Eating well! 8. DM type 2: Was on oral meds at home with good control on Starlix and SSI  -starlix resumed.   -HS snack for am hypoglycemia  -changed to cm moderate diet  -sugars improved 12. A fib: Monitor heart rate every 8 hours. Continue amiodarone and Xarelto 13. C diff colitis: stool consistency better  - Continue flagyl with "taper"---essentially 1 week left  -probiotic/fiber  -on orals solely  now      LOS (Days) 11 A FACE TO FACE EVALUATION WAS PERFORMED  Aengus Sauceda T 02/02/2015 7:14 AM

## 2015-02-02 NOTE — Progress Notes (Signed)
Wife has concerns about discharge as well as questions about need for cyclic? Or further treatment. Dr. Krista Blue contacted and recommended follow up in office as well as checking with Neurohospitalist for their recommendations. Spoke with David/Neuro PA who reported no further treatment planned as patient  Is improving and to follow up with primary neurologist past discharge.

## 2015-02-02 NOTE — Progress Notes (Signed)
Speech Language Pathology Discharge Summary  Patient Details  Name: Craig [name] MRN: [id number] Date of Birth: 06-20-1941  Today's Date: 02/02/2015 SLP Individual Time: 1130-1200 SLP Individual Time Calculation (min): 30 min  Skilled Therapeutic Interventions:  Skilled treatment session focused on addressing dysphagia goals and wrap up of education prior to discharge.  Patient consumed Dys.3/regular textures and thin liquids via straw with increased time to recall and utilize safe swallow strategies with minimal overt s/s of aspiration, which occurred at the end of the meal due to suspected reflux.  SLP also provided wrap up of education regarding diet and safe swallow recommendations with patient and wife; they were provided with a supplemental handout and teach back opportunities.  Patient and spouse verbalized understanding of information.      Patient has met 2 of  2 long term goals.  Patient to discharge at overall Modified Independent level.  Reasons goals not met: n/a   Clinical Impression/Discharge Summary: Patient has made functional gains during his rehab admission and has met 2 out of 2 long term goals due to improved speech intelligibility and diet toleration.  Currently, patient requires increased time for recall and utilization of swallow compensatory strategies to minimize overt s/s of aspiration with Dys.3/regular textures and thin liquids.  Patient and family education has been completed and they were provided with a handout to reference.  Patient and wife agreeable to progress and no further need for follow up services at this time, given that patient is overall Mod I.   Care Partner:  Caregiver Able to Provide Assistance: Yes  Type of Caregiver Assistance:  (n/a)  Recommendation:  None     Equipment: none   Reasons for discharge: Treatment goals met;Discharged from hospital   Patient/Family Agrees with Progress Made and Goals Achieved: Yes   See FIM for current  functional status  Carmelia Roller., CCC-SLP [phone]  [employer] 02/02/2015, 4:55 PM

## 2015-02-02 NOTE — Plan of Care (Signed)
Problem: RH PAIN MANAGEMENT Goal: RH STG PAIN MANAGED AT OR BELOW PT'S PAIN GOAL Pain managed with prn medication at or below patient's goal of 9  Outcome: Completed/Met Date Met:  02/02/15 No c/o pain

## 2015-02-02 NOTE — Telephone Encounter (Signed)
Appointment scheduled - Olin Hauser aware and will give information to patient and family.

## 2015-02-02 NOTE — Progress Notes (Signed)
Physical Therapy Session Note  Patient Details  Name: Craig [name] MRN: [id number] Date of Birth: 1941-08-27  Today's Date: 02/02/2015  Short Term Goals: Week 2:  PT Short Term Goal 1 (Week 2): = LTGs  Session #1: [id number] (60 min billable; missed 15 min due to pt on toilet but made up within session) Denies pain this AM. Pt request to use bathroom for BM. Performed supine to sit mod I from flat bed to simulate home environment. Transferred and performed gait with RW in/out of bathroom and hand hygiene in standing at sink at S level. Donned clothing EOB with sit to stands to pull up pants and underwear using RW for support for balance with S with wife providing cueing and assist as needed. Pt's wife also checked off on toilet transfers by this therapist this AM with safe return demonstration. Simulated home entry training for stair negotiation performing 3 steps with R rail and L SPC and min A x 2 reps - once with therapist and second time return demonstration with pt's wife for family training. Pt's wife able to safely demonstrate at min A level and provide cueing as needed for foot placement and sequence. Multiple gait trials during session for overall endurance and functional mobility training with maximum distance of 140'. Measured gait velocity three trials with average = 1.93 ft/sec indicating decreased functional efficiency. Household mobility training and bed mobility in ADL apartment to simulate regular bed for home with pt able to perform at mod I to S level. Pt requires cues to lock breaks on w/c for transfers, though he will not be using a w/c at home. Pt's wife able to provide appropriate level of cues needed throughout session.   Session #2: 1415-1450 (35 min) Denies pain. Session focused on overall activity tolerance, functional strengthening, stair negotiation, and LE HEP. Pt and wife deny any concerns in regards to d/c tomorrow and discussed recommendation for HHPT follow up. Pt  performed transfers at S level with RW throughout session; cues for locking brakes needed and hand placement at times. Simulated home entry stairs with R rail and SPC on L x 3 with min A and for general and community mobility practiced up/down 8 steps with bilateral rails (4" steps) with min A. Introduced HEP for [employer] exercises to address strength and balance. Pt performed 10 reps for BLE including standing hip abduction, standing hamstring curls, mini squats, and standing heel/toe raises. Also added sit to stands x 5 reps with attempt for minimal use of UE's (though pt requires this at this time). Returned to recliner at end of session with S for transfer with RW.    Therapy Documentation Precautions:  Precautions Precautions: Fall Precaution Comments: Monitor O2 Restrictions Weight Bearing Restrictions: No   See FIM for current functional status  Therapy/Group: Individual Therapy  [name], PT, DPT  02/02/2015, 2:57 PM

## 2015-02-03 LAB — GLUCOSE, CAPILLARY: GLUCOSE-CAPILLARY: 90 mg/dL (ref 70–99)

## 2015-02-03 MED ORDER — NYSTATIN 100000 UNIT/GM EX POWD: 1.0000 g | Freq: Three times a day (TID) | CUTANEOUS | Status: AC

## 2015-02-03 MED ORDER — TRAMADOL HCL 50 MG PO TABS: 50.0000 mg | ORAL_TABLET | Freq: Four times a day (QID) | ORAL | Status: AC | PRN

## 2015-02-03 MED ORDER — METRONIDAZOLE 500 MG PO TABS: 500.0000 mg | ORAL_TABLET | Freq: Two times a day (BID) | ORAL | Status: DC

## 2015-02-03 MED ORDER — NATEGLINIDE 120 MG PO TABS: 120.0000 mg | ORAL_TABLET | Freq: Three times a day (TID) | ORAL | Status: AC

## 2015-02-03 MED ORDER — PYRIDOSTIGMINE BROMIDE 60 MG PO TABS: 60.0000 mg | ORAL_TABLET | Freq: Four times a day (QID) | ORAL | Status: DC

## 2015-02-03 MED ORDER — HYDROCERIN EX CREA: 1.0000 "application " | TOPICAL_CREAM | Freq: Two times a day (BID) | CUTANEOUS | Status: AC

## 2015-02-03 MED ORDER — AMIODARONE HCL 100 MG PO TABS: 100.0000 mg | ORAL_TABLET | Freq: Every day | ORAL | Status: AC

## 2015-02-03 MED ORDER — FREE WATER: 50.0000 mL | Freq: Every day | Status: AC

## 2015-02-03 MED ORDER — CALCIUM POLYCARBOPHIL 625 MG PO TABS: 625.0000 mg | ORAL_TABLET | Freq: Every day | ORAL | Status: AC

## 2015-02-03 NOTE — Progress Notes (Signed)
[facility] PHYSICAL MEDICINE & REHABILITATION     PROGRESS NOTE    Subjective/Complaints: No issues. Excited to go home  Objective: Vital Signs: Blood pressure 140/79, pulse 74, temperature 97.5 F (36.4 C), temperature source Oral, resp. rate 17, weight 102.7 kg (226 lb 6.6 oz), SpO2 93 %. No results found. No results for input(s): WBC, HGB, HCT, PLT in the last 72 hours. No results for input(s): NA, K, CL, GLUCOSE, BUN, CREATININE, CALCIUM in the last 72 hours.  Invalid input(s): CO CBG (last 3)   Recent Labs  02/02/15 1637 02/02/15 2046 02/03/15 0608  GLUCAP 151* 162* 90    Wt Readings from Last 3 Encounters:  02/03/15 102.7 kg (226 lb 6.6 oz)  01/21/15 107.6 kg (237 lb 3.4 oz)  01/08/15 99.791 kg (220 lb)    Physical Exam:   General: Alert and oriented x 3, No apparent distress HEENT: Head is normocephalic, atraumatic, PERRLA, EOMI, sclera anicteric, oral mucosa pink and moist, dentition intact, ext ear canals clear,  Neck: Supple without JVD or lymphadenopathy Heart: Reg rate and rhythm. No murmurs rubs or gallops Chest: CTA bilaterally without wheezes, rales, or rhonchi; no distress Abdomen: Soft, non-tender, non-distended, bowel sounds positive. Extremities: No clubbing, cyanosis, or edema. Pulses are 2+ Skin: breakdown over bridge of nose healing nicely. Scrotum and inguinal area with mild redness. Non tender, moist. Stasis changes bilateral LE's.  Neuro: Pt is cognitively appropriate reasonable insight, memory,  and awareness. resolved ptosis. Improved oral-motor control speech much clearer. Phonation good.   Sensory exam is normal. Reflexes are 2+ in all 4's. Fine motor coordination is intact. No tremors. Motor function is grossly 4+/5 uppers and  4+/5 lowers. Improved trunk control.  Musculoskeletal: Full ROM, No pain with AROM or PROM in the neck, trunk, or extremities. Posture appropriate Psych: Pt's affect is appropriate. Pt is  cooperative    Assessment/Plan: 1. Functional deficits secondary to myasthenia gravis with crisis which require 3+ hours per day of interdisciplinary therapy in a comprehensive inpatient rehab setting. Physiatrist is providing close team supervision and 24 hour management of active medical problems listed below. Physiatrist and rehab team continue to assess barriers to discharge/monitor patient progress toward functional and medical goals.  Dc home today. Goals met!!!    FIM: FIM - Bathing Bathing Steps Patient Completed: Chest, Right Arm, Left Arm, Abdomen, Right upper leg, Left upper leg, Front perineal area, Left lower leg (including foot), Right lower leg (including foot), Buttocks Bathing: 5: Supervision: Safety issues/verbal cues  FIM - Upper Body Dressing/Undressing Upper body dressing/undressing steps patient completed: Thread/unthread right sleeve of pullover shirt/dresss, Thread/unthread left sleeve of pullover shirt/dress, Pull shirt over trunk, Put head through opening of pull over shirt/dress Upper body dressing/undressing: 5: Set-up assist to: Obtain clothing/put away FIM - Lower Body Dressing/Undressing Lower body dressing/undressing steps patient completed: Thread/unthread right pants leg, Thread/unthread left pants leg, Pull pants up/down, Don/Doff right sock, Don/Doff left sock, Don/Doff left shoe, Don/Doff right shoe, Thread/unthread left underwear leg, Thread/unthread right underwear leg, Pull underwear up/down Lower body dressing/undressing: 4: Min-Patient completed 75 plus % of tasks  FIM - Toileting Toileting steps completed by patient: Adjust clothing prior to toileting, Performs perineal hygiene, Adjust clothing after toileting Toileting Assistive Devices: Grab bar or rail for support Toileting: 5: Set-up assist to: Obtain supplies  FIM - Air cabin crew Transfers Assistive Devices: Insurance account manager Transfers: 5-To toilet/BSC: Supervision (verbal  cues/safety issues), 5-From toilet/BSC: Supervision (verbal cues/safety issues)  FIM - IT sales professional  Transfer Assistive Devices: Environmental consultant, Arm rests Bed/Chair Transfer: 6: Supine > Sit: No assist, 6: Sit > Supine: No assist, 5: Bed > Chair or W/C: Supervision (verbal cues/safety issues), 5: Chair or W/C > Bed: Supervision (verbal cues/safety issues)  FIM - Locomotion: Wheelchair Distance: 200 Locomotion: Wheelchair: 5: Travels 150 ft or more: maneuvers on rugs and over door sills with supervision, cueing or coaxing FIM - Locomotion: Ambulation Locomotion: Ambulation Assistive Devices: Administrator Ambulation/Gait Assistance: 5: Supervision Locomotion: Ambulation: 2: Travels 50 - 149 ft with supervision/safety issues  Comprehension Comprehension Mode: Auditory Comprehension: 5-Follows basic conversation/direction: With no assist  Expression Expression Mode: Verbal Expression: 6-Expresses complex ideas: With extra time/assistive device  Social Interaction Social Interaction: 5-Interacts appropriately 90% of the time - Needs monitoring or encouragement for participation or interaction.  Problem Solving Problem Solving: 5-Solves basic problems: With no assist  Memory Memory: 5-Recognizes or recalls 90% of the time/requires cueing < 10% of the time  Medical Problem List and Plan: 1. Functional deficits secondary to MG crisis    2. DVT Prophylaxis/Anticoagulation: Pharmaceutical: Xarelto 3. Pain Management: Tylenol prn.  4. Mood: LCSW to follow for support. 5. Neuropsych: This patient is capable of making decisions on her own behalf. 6. Skin/Wound Care: Routine pressure relief measures.  nystatin powder to help with moisture/yeast in periarea.  7. Fluids/Electrolytes/Nutrition:  On regular diet.  Eating well! 8. DM type 2: Was on oral meds at home with good control on Starlix and SSI  -starlix resumed.   -HS snack for am hypoglycemia  -changed to cm moderate  diet  -sugars improved 12. A fib: Monitor heart rate every 8 hours. Continue amiodarone and Xarelto 13. C diff colitis: stool consistency better  - Continue flagyl with "taper"---qd for about a week then stop  -probiotic/fiber  -on orals solely now      LOS (Days) 12 A FACE TO FACE EVALUATION WAS PERFORMED  Craig [name] T 02/03/2015 7:46 AM

## 2015-02-03 NOTE — Consult Note (Signed)
  [place]   According to medical records, Mr. Craig [name] was admitted to the rehab unit owing to "Functional deficits secondary to MG crisis." Records also indicate that he is a "74 y.o. male with history of AFib, bladder cancer, DM type 2, MG with recent flare who was recently discharged to home past CIR on 01/02/15. He was seen by outpatient neurologist on 02/26 with plans to start home IVIG but was readmitted on 01/10/15 with fatigue, worsening of SOB and hypoxia."   Craig [name] was seen by Dr. Beverly Gust (neuropsychology) during his last rehab admission. Pertinent information from that visit is as follows:   Craig [name]'s brief neurocognitive screening was not indicative of marked cognitive disruption, at the level of dementia.  However, if he develops cognitive difficulties or concerns about his cognitive functioning, a comprehensive neuropsychological evaluation as an outpatient could be conducted.  From an emotional standpoint, Craig [name] did not endorse symptoms that would suggest the presence of clinically significant mood disruption.  Risks for development of depression post-discharge were discussed with Craig [name] and his wife and he agreed to inform his care team should he notice symptoms of clinical depression.   Patient was then seen by this provider on 3.14.2016. At that visit, Craig [name] denied suffering from any cognitive difficulties or adjustment problems. He has ample support via his wife, but he appeared somewhat depressed even though he subjectively denied any depressive symptomatology.  I planned to follow-up with him throughout his admission for brief supportive counseling.   During today's visit, Craig [name] was again accompanied by his wife. There were no new cognitive concerns and he said that his mood has remained "good." He is happy to be discharging home tomorrow and feels that he has made great progress in therapy. At this time, I see no  need for any neuropsychological or psychological follow-up post-discharge. I encouraged them to contact us if required.     Rutha Bouchard, Psy.D.  Clinical Neuropsychologist

## 2015-02-03 NOTE — Progress Notes (Signed)
Social Work Discharge Note  The overall goal for the admission was met for:   Discharge location: Yes - home  Length of Stay: Yes - 12 days  Discharge activity level: Yes - supervision  Home/community participation: Yes  Services provided included: MD, RD, PT, OT, SLP, RN, Pharmacy, Neuropsych and SW  Financial Services: Private Insurance: [employer]  Follow-up services arranged: Home Health:  PT and RN and Patient/Family request agency HH: resume [facility] who pt had for one week post d/c in february, DME: none needed  Comments (or additional information): Pt to return home to the care of his wife and HH.  Pt's wife feels comfortable with providing 24/7 supervision and both pt and wife are happy pt is being discharged.  Wife wants to return tube feedings they have at the house, as pt does not need them any longer.  CSW has left a message for [place] and will ask them to call pt's wife at home.  Patient/Family verbalized understanding of follow-up arrangements: Yes  Individual responsible for coordination of the follow-up plan: pt's wife  Confirmed correct DME delivered: Trey Sailors 02/03/2015    Aleksei Goodlin, Silvestre Mesi

## 2015-02-03 NOTE — Progress Notes (Signed)
Social Work Patient ID: Craig [name], male   DOB: [birth year], 74 y.o.   MRN: [id number]   CSW received phone call from pt's wife re: a call she had already received once they arrived home today stating that [name] was calling to set up home care visits.  Wife explained to caller that pt was already set up with [facility].  Wife asked CSW to make sure that [facility] is NOT coming out to see pt and that [facility] be seeing him.  CSW called [facility] to cancel referral.  CSW already heard from [facility] that they are prepared to see him at home.  CSW also called [facility] Liaison, Marthenia Rolling, to make her aware of [facility] involvement.  She will pass this on to community care Freight forwarder.  CSW remains available as needed.

## 2015-02-03 NOTE — Progress Notes (Signed)
Discharged to home accompanied by wife. Discharge instructions given to patient and wife and no questions noted., Equipment at home as discharged with in past. belinging packed up and sent with patient. Margarito Liner

## 2015-02-03 NOTE — Discharge Instructions (Signed)
Inpatient Rehab Discharge Instructions  Craig [name] Discharge date and time: 02/03/15   Activities/Precautions/ Functional Status: Activity: activity as tolerated Diet: diabetic diet Wound Care: Keep a band aid on nose till area healed up.   Functional status:  ___ No restrictions     ___ Walk up steps independently ___ 24/7 supervision/assistance   ___ Walk up steps with assistance _X__ Intermittent supervision/assistance  ___ Bathe/dress independently ___ Walk with walker     ___ Bathe/dress with assistance ___ Walk Independently    ___ Shower independently ___ Walk with assistance    ___ Shower with assistance _X__ No alcohol     ___ Return to work/school ________  COMMUNITY REFERRALS UPON DISCHARGE:   Home Health:   PT     RN  Agency:  [place] Phone:  [phone]  Medical Equipment/Items Ordered:  You have all needed equipment already at home   Special Instructions:    My questions have been answered and I understand these instructions. I will adhere to these goals and the provided educational materials after my discharge from the hospital.  Patient/Caregiver Signature _______________________________ Date __________  Clinician Signature _______________________________________ Date __________  Please bring this form and your medication list with you to all your follow-up doctor's appointments.

## 2015-02-05 ENCOUNTER — Other Ambulatory Visit: Payer: Self-pay | Admitting: *Deleted

## 2015-02-10 ENCOUNTER — Inpatient Hospital Stay: Payer: PPO | Admitting: Physical Medicine & Rehabilitation

## 2015-02-11 ENCOUNTER — Other Ambulatory Visit: Payer: PPO | Admitting: *Deleted

## 2015-02-12 ENCOUNTER — Encounter: Payer: Self-pay | Admitting: *Deleted

## 2015-02-12 ENCOUNTER — Other Ambulatory Visit: Payer: Self-pay | Admitting: *Deleted

## 2015-02-12 NOTE — Patient Outreach (Signed)
[facility]) Care Management  02/12/2015  Craig [name] March 10, 1941 [id number]   Subjective :The patients wife Jaishon Krisher ) returned the transition of care phone call that I placed on yesterday.Reports that the patient is doing " fairly well, still weak, will take some time to get stronger". Reports that the patient is still being followed by [facility], RN and PT. She reports is BP is 118/94, "a little higher than normal,  Weight 222, no change up or down, O2 sat 95 % on room air during the day and wears O2 with BiPAP at night. Discussed with Mrs. Hogans to take the record of his blood pressure reading to PCP appointment on 02/13/15  Plan:.Follow up with transition of care phone call on 4/6 at [place], [place], [place] Care Management [phone]

## 2015-02-16 ENCOUNTER — Encounter: Payer: Self-pay | Admitting: Neurology

## 2015-02-16 ENCOUNTER — Telehealth: Payer: Self-pay | Admitting: Neurology

## 2015-02-16 ENCOUNTER — Ambulatory Visit (INDEPENDENT_AMBULATORY_CARE_PROVIDER_SITE_OTHER): Payer: PPO | Admitting: Neurology

## 2015-02-16 VITALS — BP 127/84 | HR 89 | Ht 67.0 in | Wt 221.5 lb

## 2015-02-16 DIAGNOSIS — G7000 Myasthenia gravis without (acute) exacerbation: Secondary | ICD-10-CM | POA: Diagnosis not present

## 2015-02-16 DIAGNOSIS — G7001 Myasthenia gravis with (acute) exacerbation: Secondary | ICD-10-CM | POA: Diagnosis not present

## 2015-02-16 MED ORDER — PYRIDOSTIGMINE BROMIDE 60 MG PO TABS: 60.0000 mg | ORAL_TABLET | Freq: Four times a day (QID) | ORAL | Status: AC

## 2015-02-16 NOTE — Telephone Encounter (Signed)
Michelle: Please corrdinator home health IVIG

## 2015-02-16 NOTE — Progress Notes (Signed)
PATIENT: Craig [name] DOB: 15-Jul-1941  HISTORICAL   Craig [name] is a 74 yo RH WM is referred by his primary care physician Dr. Nicki Reaper for evaluation of recent hospital discharge, for myasthenia gravis exacerbation, he is accompanied by his wife, and daughter at today's clinical visit.  He had a history of atrial fibrillation, on Xalreto, diabetes, hypertension, bladder cancer diagnosed in 2014, underwent resection, followed by chemotherapy with metastatic lesion to his abdominal wall, finished chemotherapy in October 2015,  At baseline, he was highly functional, ambulate without difficulty, he was diagnosed with myasthenia gravis in 2014 by neurologist Dr. Metta Clines, presented with right ptosis only at that time, was given Mestinon, but never was given immunosuppressive treatment.  In January 2016, he noticed gradual worsening bilateral ptosis, dysphagia, dysarthria, generalized weakness, could not raise arm ove through February second, total of 6 treatment, along with prednisone 60 mg daily, Mestinon 90 mg 4 times a day, after third plasma exchange, he began to have some improvement, now his talking is eligible, still has profound dysphagia, genrhead, but December 05 2014, he has developed breathing difficulty, brought by ambulance to [facility], was intubated, diagnosed with myasthenia gravis exacerbation, require tube feeding, was treated with plasma exchange from January 23 third through February 2nd 2016, total of 6 treatment, along with prednisone 60 mg daily, Mestinon 90 mg 4 times a day, after third plasma exchange, he began to have some improvement, now his talking is eligible, still has profound dysphagia, generalized weakness, ptosis,  He was discharged from rehabilitation to home, wife is the main caregiver, he can ambulate without assistance, but still not eating by mouth.  CT chest   No evidence for pulmonary embolism. Limited evaluation of thesubsegmental pulmonary arteries due  to motion artifact. Interval placement of gastrostomy tube. Small amount of free intraperitoneal air adjacent to the anterior margin the stomach, likely postprocedural in etiology.  CT abdomen: demonstrated omental nodularity, compatible with metastatic disease. Celiac origin aneurysm, stable. Bilateral lower lobe atelectatic changes.  Lab normal TSH, A1c 6.9.  UPDATE April 4th 2016; I saw him initially in January 08 2015, he was admitted to the hospital again January 10 2015 the cause of worsening generalized weakness, shortness of breath, received 1 IVIG, with continued worsening shortness of breath, weakness, eventually received IVIG, finished around January 20 2015, was discharged to rehabilitation, now back home, hospital course was also complicated by C. difficile infection, which required prolonged antibiotics, possible pneumonia  He is now taking Mestinon 60 mg 4 times a day, prednisone 20 mg 3 tablets every morning, sleep in lifting chair, eats soft food, still has peg  Tube in place. Generalized weakness, fatigue, no double vision   REVIEW OF SYSTEMS: Full 14 system review of systems performed and notable only for as above sleeping difficulty   ALLERGIES: Allergies  Allergen Reactions  . Ibuprofen Other (See Comments)    Interacts with another medication patient is taking    HOME MEDICATIONS: Current Outpatient Prescriptions  Medication Sig Dispense Refill  . amiodarone (PACERONE) 100 MG tablet Place 1 tablet (100 mg total) into feeding tube daily.    Marland Kitchen antiseptic oral rinse (CPC / CETYLPYRIDINIUM CHLORIDE 0.05%) 0.05 % LIQD solution 7 mLs by Mouth Rinse route 2 (two) times daily. 946 mL 1  . artificial tears (LACRILUBE) OINT ophthalmic ointment Place into both eyes every 4 (four) hours as needed for dry eyes.  0  . atorvastatin (LIPITOR) 40 MG tablet Place 1 tablet (40  mg total) into feeding tube daily.    . cephALEXin (KEFLEX) 250 MG/5ML suspension Place 5 mLs (250 mg total)  into feeding tube every 8 (eight) hours. 35 mL 0  . chlorhexidine (PERIDEX) 0.12 % solution 15 mLs by Mouth Rinse route 2 (two) times daily. 120 mL 0  . famotidine (PEPCID) 40 MG/5ML suspension Take 5 mLs (40 mg total) by mouth daily. 150 mL 0  . insulin aspart (NOVOLOG FLEXPEN) 100 UNIT/ML FlexPen Inject 0-10 Units into the skin 4 (four) times daily. Use according to scale prior to tube feeds. 15 mL 1  . ipratropium (ATROVENT HFA) 17 MCG/ACT inhaler Inhale 1 puff into the lungs every 6 (six) hours as needed for wheezing.    Marland Kitchen ipratropium (ATROVENT) 0.02 % nebulizer solution Inhale 2.5 mLs (0.5 mg total) into the lungs every 6 (six) hours as needed (wHEEZING.). 75 mL 12  . metoprolol tartrate (LOPRESSOR) 25 MG tablet Place 0.5 tablets (12.5 mg total) into feeding tube daily.    . nateglinide (STARLIX) 120 MG tablet Place 1 tablet (120 mg total) into feeding tube 3 (three) times daily. 90 tablet 1  . Nutritional Supplements (FEEDING SUPPLEMENT, JEVITY 1.2 CAL,) LIQD Place 390 mLs into feeding tube 4 (four) times daily -  with meals and at bedtime.  0  . ondansetron (ZOFRAN) 4 MG tablet Take 1 tablet (4 mg total) by mouth every 6 (six) hours as needed for nausea. 20 tablet 0  . potassium chloride 20 MEQ/15ML (10%) SOLN Place 15 mLs (20 mEq total) into feeding tube 3 (three) times daily. 3800 mL 0  . predniSONE (DELTASONE) 20 MG tablet Place 3 tablets (60 mg total) into feeding tube daily with breakfast. 150 tablet 1  . pyridostigmine (MESTINON) 60 MG tablet 1.5 tablets (90 mg total) by Per NG tube route 4 (four) times daily. 135 tablet 1  . rivaroxaban (XARELTO) 20 MG TABS tablet Take 1 tablet (20 mg total) by mouth daily with supper.    . senna (SENOKOT) 8.6 MG TABS tablet Place 1 tablet (8.6 mg total) into feeding tube daily as needed for mild constipation. 120 each 0  . Water For Irrigation, Sterile (FREE WATER) SOLN Place 150 mLs into feeding tube 4 (four) times daily - after meals and at bedtime.      No current facility-administered medications for this visit.    PAST MEDICAL HISTORY: Past Medical History  Diagnosis Date  . Atrial fibrillation, chronic     on Xarelto   . HTN (hypertension)   . CHF (congestive heart failure)   . Diabetes mellitus   . 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 Myasthenia gravis     PAST SURGICAL HISTORY: Past Surgical History  Procedure Laterality Date  . Abdominal wall metastasis removal      FAMILY HISTORY: Family History  Problem Relation Age of Onset  . Colon cancer Mother   . Heart attack Father   . Diabetes Father     SOCIAL HISTORY:  History   Social History  . Marital Status: Married    Spouse Name: N/A  . Number of Children: 3  . Years of Education: 14   Occupational History  . Retired    Social History Main Topics  . Smoking status: Never Smoker   . Smokeless tobacco: Not on file  . Alcohol Use: No  . Drug Use: No  . Sexual Activity: No   Other Topics Concern  .  Not on file   Social History Narrative   Right-handed.   Lives at home with his wife.   No caffeine use.     PHYSICAL EXAM   Filed Vitals:   02/16/15 0921  BP: 127/84  Pulse: 89  Height: 5\' 7"  (1.702 m)  Weight: 221 lb 8 oz (100.472 kg)    Not recorded      Body mass index is 34.68 kg/(m^2).  PHYSICAL EXAMNIATION:  Gen: NAD, conversant, well nourised, obese, well groomed                     Cardiovascular: Regular rate rhythm, no peripheral edema, warm, nontender. Eyes: Conjunctivae clear without exudates or hemorrhage Neck: Supple, no carotid bruise. Pulmonary:  poor air movements at the base of his lung   NEUROLOGICAL EXAM:  MENTAL STATUS: Speech: Profound dysarthria Cognition:    The patient is oriented to person, place, and time;     recent and remote memory intact;     language fluent;     normal attention, concentration,     fund of knowledge.  CRANIAL  NERVES: CN II: Visual fields are full to confrontation. Fundoscopic exam is normal with sharp discs and no vascular changes. Venous pulsations are present bilaterally. Pupils are 4 mm and briskly reactive to light. Visual acuity is 20/20 bilaterally. CN III, IV, VI: Eye movement was full, CN V: Facial sensation is intact to pinprick in all 3 divisions bilaterally. Corneal responses are intact.  CN VII: Moderate  bilateral eye closure, cheek puff, tongue protrusion, soft palate movement weakness CN VIII: Hearing is normal to rubbing fingers CN IX, X: Palate elevates symmetrically. Phonation is normal. CN XI: Head turning and shoulder shrug are intact CN XII: Tongue is midline with normal movements and no atrophy. Mild neck flexion weakness  MOTOR: He has bilateral intrinsic hand muscle atrophy, moderate weakness of bilateral shoulder abduction, elbow flexion, hand grip, hip flexion, ankle dorsiflexion  REFLEXES: Hypoactive and symmetric. Plantar responses are flexor.  SENSORY: Light touch, pinprick, position sense, and vibration sense are intact in fingers and toes.  COORDINATION: Rapid alternating movements and fine finger movements are intact. There is no dysmetria on finger-to-nose and heel-knee-shin. There are no abnormal or extraneous movements.   GAIT/STANCE: Need to push up from seated position, cautious, mildly unsteady gait    DIAGNOSTIC DATA (LABS, IMAGING, TESTING) - I reviewed patient records, labs, notes, testing and imaging myself where available.  Lab Results  Component Value Date   WBC 9.7 01/23/2015   HGB 10.9* 01/23/2015   HCT 33.4* 01/23/2015   MCV 93.6 01/23/2015   PLT 171 01/23/2015      Component Value Date/Time   NA 142 01/23/2015 0608   K 3.5 01/23/2015 0608   CL 104 01/23/2015 0608   CO2 32 01/23/2015 0608   GLUCOSE 156* 01/23/2015 0608   BUN 16 01/23/2015 0608   CREATININE 0.60 01/23/2015 0608   CALCIUM 8.4 01/23/2015 0608   PROT 4.1* 01/23/2015  0608   ALBUMIN 2.9* 01/23/2015 0608   AST 17 01/23/2015 0608   ALT 15 01/23/2015 0608   ALKPHOS 38* 01/23/2015 0608   BILITOT 0.6 01/23/2015 0608   GFRNONAA >90 01/23/2015 0608   GFRAA >90 01/23/2015 0608   No results found for: CHOL, HDL, LDLCALC, LDLDIRECT, TRIG, CHOLHDL Lab Results  Component Value Date   HGBA1C 6.9* 12/28/2014   No results found for: [id number] Lab Results  Component Value Date   TSH 0.393  12/27/2014      ASSESSMENT AND PLAN  GEE HABIG is a 74 y.o. male  with generalized myasthenia gravis, presented with exacerbation, breathing difficulty, require intubation, profound dysphagia require PEG tube placement in January 2015, mild to moderate improvement following plasma exchange, last one was in February second 2016, metastatic bladder cancer, finished chemotherapy in October 2015,   1, myasthenia gravis with exacerbation, 2, home health IVIG 3, keep current dose of Mestinon 60 mg 4 times a day, decrease prednisone to 20 mg 2 tabs daily, 4,  RTC in 2 weeks. 5. MRI brain  Marcial Pacas, M.D. Ph.D.  [facility] [address], [place], [place] [address] Ph: [phone]

## 2015-02-16 NOTE — Telephone Encounter (Signed)
Previously approved with [facility] will set this up for him.

## 2015-02-18 ENCOUNTER — Other Ambulatory Visit: Payer: Self-pay | Admitting: *Deleted

## 2015-02-18 NOTE — Patient Outreach (Signed)
[name]) Care Management  02/18/2015  Craig [name] 03-Dec-1940 [id number]  Transition of care call.  Subjective : Spoke with  Mrs.Craig  [name] she reports that Mr. Craig [name]  is doing a little better but progress is slow. States pt is still followed by [facility] PT and RN, and awaiting approval for home IVIG. She reports BP 120/84, Fasting CBG 81 and maintaining weight at 221, his appetite is best at breakfast meal.   Assessment : No increases in weight gain, blood pressure stable, continues with decreased appetite, not using feeding tube at this time. Supportive caregiver.  Plan: Scheduled telephone visit for 4/13 at 0900, discussed [place] Visit, reviewed my contact information and encouraged to call if she has concerns.  Joylene Draft, RN, [place] [facility] [phone]

## 2015-02-19 ENCOUNTER — Telehealth: Payer: Self-pay | Admitting: Neurology

## 2015-02-19 DIAGNOSIS — G7001 Myasthenia gravis with (acute) exacerbation: Secondary | ICD-10-CM

## 2015-02-19 NOTE — Telephone Encounter (Signed)
-----   Message from Gilda Crease sent at 02/17/2015  4:04 PM EDT ----- Darlyn Chamber After doing some research, I have found out that a new order will need to be created for the IVIG.  This is bc patient has been in and out of hospital.  Dr Krista Blue, can you please put a new order in Epic with drug, dosage, frequency and Dx?  Thanks Ladies! Betsy  ----- Message -----    From: Desmond Lope, RN    Sent: 02/16/2015   1:48 PM      To: [place],  This patient was previously approved for home IVIG. He is home from the hospital and Dr. Krista Blue would like to get this set up for him.  Please let me know if you need any help.  Thank you, Sharyn Lull

## 2015-02-19 NOTE — Telephone Encounter (Signed)
Would you like for him to get PT during his IVIG therapy?

## 2015-02-19 NOTE — Telephone Encounter (Signed)
He should have both, if there is conflict of schedule, IVIG first, followed by PT,

## 2015-02-19 NOTE — Discharge Summary (Signed)
Physician Discharge Summary  Patient ID: JONAS GOH MRN: [id number] DOB/AGE: March 12, 1941 74 y.o.  Admit date: 12/17/2014 Discharge date: 12/27/2014  Discharge Diagnoses:  Principal Problem: Chest pain    Active Problems:   MG, crisis (myasthenia gravis)   Dysphagia, pharyngoesophageal phase   Enteritis due to Clostridium difficile   Pain in the chest   Diaphoresis   Bradycardia   Dysphagia   Discharged Condition:  Guarded   Labs:  Basic Metabolic Panel: No results for input(s): NA, K, CL, CO2, GLUCOSE, BUN, CREATININE, CALCIUM, MG, PHOS in the last 168 hours.  CBC: No results for input(s): WBC, NEUTROABS, HGB, HCT, MCV, PLT in the last 168 hours.  CBG: No results for input(s): GLUCAP in the last 168 hours.  Brief HPI:   Munir Victorian is a 74 y.o. male with history of A fib, metastatic bladder cancer, recent PNA, MG who has had worsening over past months. He was evaluated by neurology 3 days PTA and was placed on steroids with increase in mestinon due to difficulty swallowing and SOB. Patient continued to worsen and was admitted via [place] on 12/05/14 with SOB, swallowing difficulty, difficulty hold head up with droopy eyelids and congestion. s. He was intubated due to respiratory distress and started on IV solumedrol as well as plasmapheresis per input by Dr. Doy Mince. He has responded to treatment and tolerated extubation on 01/25 with use of BIPAP prn. He is NPO with panda tube feeds and trials of ice chips by ST.  He was started on flagyl for c diff colitis with diarrhea and completes treatment on 02/10. Patient has  his therapy for MG crises and cleared for comprehensive inpatient rehab program.    Hospital Course: WILLEY DUE was admitted to rehab 12/17/2014 for inpatient therapies to consist of PT, ST and OT at least three hours five days a week. Past admission physiatrist, therapy team and rehab RN have worked together to provide customized collaborative inpatient rehab.  Respiratory status was improving with decrease in secretions and patient was tolerating BIPAP without difficulty. Blood pressures was stable and no signs of overload noted off diuretics. Heart rate has been controlled with increase in activity tolerance.  Acute on chronic diarrhea improved with addition of probiotic, questran as well as fiber supplements. He was maintained on tube feeds as minimal improvement seen on repeat swallow evaluation. PEG tube was placed by Dr. Vernard Gambles on 02/12 without difficulty.  Later that evening, patient was noted to be diaphoretic with onset of chest pain. He was treated with IV dilaudid and SL NTG. EKG done revealing nonspecific changes as well as A fib. Cardiac enzymes were ordered to rule out ACS. Dr. Hal Hope was consulted to transfer patient to monitored floor for further workup.  Patient was discharged on 12/27/14.   Disposition:  Acute hospital/Telemetry.   Diet: NPO with panda tube feeds  Current Medications: Albuterol 0.083% every 3 hours prn SOB Amiodarone 100 mg per tube Norvasc 5 mg daily per tube Pepcid 20 mg daily per tube Cholestyramine 4 g daily per tube.  Hycet 7.5/325 mg every 4 hours per tube.  lactinex 1 gram tid per tube Florastor 250 mg bid kdur 20 meq bid Prednisone 60 mg daily Pyridostigmine 90 mg qid    Signed: Bary Leriche 02/19/2015, 1:38 PM

## 2015-02-19 NOTE — Telephone Encounter (Signed)
Derek a Therapist with [facility]  is calling to see if Physical Therapy should be continued for the patient since Dr. Krista Blue has ordered IV Therapy. Physical Therapy was ordered by another doctor. Please call and advise. Thank you.

## 2015-02-19 NOTE — Telephone Encounter (Signed)
Michelle: please let Gwinda Passe know, I have replaced order for home IVIG

## 2015-02-23 NOTE — Telephone Encounter (Signed)
[facility] received and ordered medication - infusion will start 02/24/15.

## 2015-02-23 NOTE — Telephone Encounter (Signed)
[name] with [facility] @ [phone] ext [phone], questioning order for IVIG received.  Please call and advise.

## 2015-02-23 NOTE — Telephone Encounter (Signed)
[name] with [facility] is calling back about IVIG. She states they need to ship today.  Thanks!

## 2015-02-25 ENCOUNTER — Other Ambulatory Visit: Payer: Self-pay | Admitting: *Deleted

## 2015-02-25 NOTE — Patient Outreach (Signed)
[facility]) Care Management  02/25/2015   Craig [name] 1941-02-08 [id number]  Subjective: Spoke with Mrs Craig [name] this am,reports patient doing "alright" . States that he has started his home IVIG treatments at home on Tuesday, and this course will end on Saturday.  Objective:   Current Medications:  Current Outpatient Prescriptions  Medication Sig Dispense Refill  . amiodarone (PACERONE) 100 MG tablet Take 1 tablet (100 mg total) by mouth daily.    Marland Kitchen artificial tears (LACRILUBE) OINT ophthalmic ointment Place into both eyes every 4 (four) hours as needed for dry eyes.  0  . atorvastatin (LIPITOR) 40 MG tablet Place 1 tablet (40 mg total) into feeding tube daily.    . Blood Glucose Monitoring Suppl (FREESTYLE LITE) DEVI Take by mouth 4 (four) times daily.  0  . famotidine (PEPCID) 40 MG/5ML suspension Take 5 mLs (40 mg total) by mouth daily. 150 mL 0  . FREESTYLE LITE test strip 4 (four) times daily. as directed  0  . hydrocerin (EUCERIN) CREA Apply 1 application topically 2 (two) times daily. 228 g 0  . insulin aspart (NOVOLOG FLEXPEN) 100 UNIT/ML FlexPen Inject 0-10 Units into the skin 4 (four) times daily. Use according to scale prior to tube feeds. 15 mL 1  . ipratropium (ATROVENT HFA) 17 MCG/ACT inhaler Inhale 1 puff into the lungs every 6 (six) hours as needed for wheezing.    . mupirocin ointment (BACTROBAN) 2 % Apply topically daily. Apply to bridge of nose wound daily 22 g 0  . nateglinide (STARLIX) 120 MG tablet Take 1 tablet (120 mg total) by mouth 3 (three) times daily. 90 tablet 1  . nystatin (MYCOSTATIN/NYSTOP) 100000 UNIT/GM POWD Apply 1 g topically 3 (three) times daily. 60 g 0  . polycarbophil (FIBERCON) 625 MG tablet Take 1 tablet (625 mg total) by mouth daily. 60 tablet 1  . predniSONE (DELTASONE) 20 MG tablet Place 3 tablets (60 mg total) into feeding tube daily with breakfast. 150 tablet 1  . pyridostigmine (MESTINON) 60 MG tablet Take 1 tablet (60  mg total) by mouth 4 (four) times daily. 130 tablet 6  . rivaroxaban (XARELTO) 20 MG TABS tablet Take 1 tablet (20 mg total) by mouth daily with supper. (Patient taking differently: Place 20 mg into feeding tube daily with supper. )    . traMADol (ULTRAM) 50 MG tablet Take 1 tablet (50 mg total) by mouth every 6 (six) hours as needed for moderate pain. 90 tablet 0  . Water For Irrigation, Sterile (FREE WATER) SOLN Place 50 mLs into feeding tube daily.     No current facility-administered medications for this visit.    Functional Status:  In your present state of health, do you have any difficulty performing the following activities: 02/25/2015 01/22/2015  Hearing? N N  Vision? N N  Difficulty concentrating or making decisions? Y N  Walking or climbing stairs? Y Y  Dressing or bathing? Y Y  Doing errands, shopping? Craig [name]  Preparing Food and eating ? Y -  Using the Toilet? N -  In the past six months, have you accidently leaked urine? Y -  Do you have problems with loss of bowel control? N -  Managing your Medications? Y -  Managing your Finances? Y -  Housekeeping or managing your Housekeeping? N -    Fall/Depression Screening: No flowsheet data found.  Assessment:   Plan:  Scheduled initial home visit on Tuesday, April 19 at 2pm. Reviewed my contact  information and encouraged to call for concerns.  Joylene Draft, RN, [place] [facility] [phone]

## 2015-03-03 ENCOUNTER — Encounter: Payer: Self-pay | Admitting: *Deleted

## 2015-03-03 ENCOUNTER — Other Ambulatory Visit: Payer: Self-pay | Admitting: *Deleted

## 2015-03-03 VITALS — BP 114/70 | HR 61 | Resp 22

## 2015-03-03 DIAGNOSIS — G7000 Myasthenia gravis without (acute) exacerbation: Secondary | ICD-10-CM

## 2015-03-03 DIAGNOSIS — E118 Type 2 diabetes mellitus with unspecified complications: Secondary | ICD-10-CM

## 2015-03-03 NOTE — Patient Outreach (Signed)
[facility]) Care Management   03/03/2015  Craig [name] 07-Oct-1941 [id number]  Craig [name] is an 74 y.o. male  Subjective:  "This is a rough day". Feeling weak.  Objective:   ROS  Physical Exam  Constitutional: He is oriented to person, place, and time. Vital signs are normal.  Cardiovascular: Normal rate.  An irregular rhythm present.  Respiratory: He has decreased breath sounds.  GI: Soft.  Neurological: He is alert and oriented to person, place, and time.  Psychiatric:  Speech slow, low tone   Moist sounding cough, patient is able to cough up secretions.  BP 114/70 mmHg  Pulse 61  Resp 22  SpO2 94%   Current Medications:   Current Outpatient Prescriptions  Medication Sig Dispense Refill  . amiodarone (PACERONE) 100 MG tablet Take 1 tablet (100 mg total) by mouth daily.    Marland Kitchen atorvastatin (LIPITOR) 40 MG tablet Place 1 tablet (40 mg total) into feeding tube daily.    . Blood Glucose Monitoring Suppl (FREESTYLE LITE) DEVI Take by mouth 4 (four) times daily.  0  . FREESTYLE LITE test strip 4 (four) times daily. as directed  0  . hydrocerin (EUCERIN) CREA Apply 1 application topically 2 (two) times daily. 228 g 0  . insulin aspart (NOVOLOG FLEXPEN) 100 UNIT/ML FlexPen Inject 0-10 Units into the skin 4 (four) times daily. Use according to scale prior to tube feeds. 15 mL 1  . ipratropium (ATROVENT HFA) 17 MCG/ACT inhaler Inhale 1 puff into the lungs every 6 (six) hours as needed for wheezing.    . nateglinide (STARLIX) 120 MG tablet Take 1 tablet (120 mg total) by mouth 3 (three) times daily. 90 tablet 1  . nystatin (MYCOSTATIN/NYSTOP) 100000 UNIT/GM POWD Apply 1 g topically 3 (three) times daily. 60 g 0  . pantoprazole (PROTONIX) 40 MG tablet Take 40 mg by mouth daily.    . polycarbophil (FIBERCON) 625 MG tablet Take 1 tablet (625 mg total) by mouth daily. 60 tablet 1  . pyridostigmine (MESTINON) 60 MG tablet Take 1 tablet (60 mg total) by mouth 4 (four)  times daily. 130 tablet 6  . rivaroxaban (XARELTO) 20 MG TABS tablet Take 1 tablet (20 mg total) by mouth daily with supper. (Patient taking differently: Place 20 mg into feeding tube daily with supper. )    . senna (SENOKOT) 8.6 MG TABS tablet Take 1 tablet by mouth daily as needed for mild constipation.    . traMADol (ULTRAM) 50 MG tablet Take 1 tablet (50 mg total) by mouth every 6 (six) hours as needed for moderate pain. 90 tablet 0  . Water For Irrigation, Sterile (FREE WATER) SOLN Place 50 mLs into feeding tube daily.    Marland Kitchen artificial tears (LACRILUBE) OINT ophthalmic ointment Place into both eyes every 4 (four) hours as needed for dry eyes. (Patient not taking: Reported on 03/03/2015)  0  . famotidine (PEPCID) 40 MG/5ML suspension Take 5 mLs (40 mg total) by mouth daily. (Patient not taking: Reported on 03/03/2015) 150 mL 0  . mupirocin ointment (BACTROBAN) 2 % Apply topically daily. Apply to bridge of nose wound daily (Patient not taking: Reported on 03/03/2015) 22 g 0  . predniSONE (DELTASONE) 20 MG tablet Place 3 tablets (60 mg total) into feeding tube daily with breakfast. (Patient taking differently: Place 40 mg into feeding tube daily with breakfast. Total of 40 mg every am) 150 tablet 1   No current facility-administered medications for this visit.  Functional Status:   In your present state of health, do you have any difficulty performing the following activities: 03/03/2015 02/25/2015  Hearing? - N  Vision? - N  Difficulty concentrating or making decisions? - Y  Walking or climbing stairs? - Y  Dressing or bathing? (No Data) Y  Doing errands, shopping? - Y  Preparing Food and eating ? - Y  Using the Toilet? - N  In the past six months, have you accidently leaked urine? (No Data) Y  Do you have problems with loss of bowel control? - N  Managing your Medications? - Y  Managing your Finances? - Y  Housekeeping or managing your Housekeeping? - N    Fall/Depression Screening:     PHQ 2/9 Scores 03/03/2015  PHQ - 2 Score 2  PHQ- 9 Score 5    Assessment:  Patient resting in chair, both patient and wife answering questions during visit, [name] voice with low tone. Wife reports that the patient has been on tube feedings only since Saturday, due to a concerning cough and risk of  swallowing difficultly, tolerating feeding without reported problems..He has completed his course of IVIG, states they are now in the "doughnut hole " with insurance. Wife voiced concern regarding patient progress and plans for cancer treatment,she reports upcoming MD appointments to discuss plans  Plan:  Reviewed  fall risk education, continue daily home monitoring of CBG, daily weights, O2 saturation and recording. Reviewed signs and symptoms to call MD about. Will consult [facility] CSW and pharmacy for concerns and  support as indicated.  Joylene Draft, RN, [place] [facility] [phone]

## 2015-03-04 ENCOUNTER — Encounter: Payer: Self-pay | Admitting: *Deleted

## 2015-03-05 ENCOUNTER — Ambulatory Visit (INDEPENDENT_AMBULATORY_CARE_PROVIDER_SITE_OTHER): Payer: PPO | Admitting: Neurology

## 2015-03-05 ENCOUNTER — Telehealth: Payer: Self-pay | Admitting: Neurology

## 2015-03-05 ENCOUNTER — Encounter: Payer: Self-pay | Admitting: Neurology

## 2015-03-05 VITALS — BP 126/82 | HR 66 | Ht 67.0 in | Wt 212.0 lb

## 2015-03-05 DIAGNOSIS — G7001 Myasthenia gravis with (acute) exacerbation: Secondary | ICD-10-CM

## 2015-03-05 NOTE — Telephone Encounter (Signed)
He needs the ordered MRI as soon as possible please.

## 2015-03-05 NOTE — Telephone Encounter (Signed)
i will get on it thanks

## 2015-03-05 NOTE — Telephone Encounter (Signed)
I have ordered MRI of the brain in the past, he needs to have MRI of the brain with and without contrast complete,  Sharyn Lull, please help.

## 2015-03-05 NOTE — Progress Notes (Signed)
PATIENT: Craig [name] DOB: 03-01-1941  HISTORICAL   Craig [name] is a 74 yo RH WM is referred by his primary care physician Dr. Nicki Reaper for evaluation of recent hospital discharge, for myasthenia gravis exacerbation, he is accompanied by his wife, and daughter at today's clinical visit.  He had a history of atrial fibrillation, on Xalreto, diabetes, hypertension, bladder cancer diagnosed in 2014, underwent resection, followed by chemotherapy with metastatic lesion to his abdominal wall, finished chemotherapy in October 2015,  At baseline, he was highly functional, ambulate without difficulty, he was diagnosed with myasthenia gravis in 2014 by neurologist Dr. Metta Clines, presented with right ptosis only at that time, was given Mestinon, but never was given immunosuppressive treatment.  December 05 2014, he has developed breathing difficulty, brought by ambulance to [facility], was intubated, diagnosed with myasthenia gravis exacerbation, require tube feeding tube, was treated with plasma exchange from January 23 third through February 2nd 2016, total of 6 treatment, along with prednisone 60 mg daily, Mestinon 90 mg 4 times a day, after third plasma exchange, he began to have some improvement, now his talking is eligible, still has profound dysphagia, generalized weakness, ptosis,  He was discharged from rehabilitation to home, wife is the main caregiver, he can ambulate without assistance, but still not eating by mouth.  CT chest   No evidence for pulmonary embolism. Limited evaluation of thesubsegmental pulmonary arteries due to motion artifact. Interval placement of gastrostomy tube. Small amount of free intraperitoneal air adjacent to the anterior margin the stomach, likely postprocedural in etiology.  CT abdomen: demonstrated omental nodularity, compatible with metastatic disease. Celiac origin aneurysm, stable. Bilateral lower lobe atelectatic changes.  Lab normal TSH, A1c  6.9.  UPDATE April 4th 2016; I saw him initially in January 08 2015, he was admitted to the hospital again January 10 2015 the cause of worsening generalized weakness, shortness of breath, received 1 IVIG, with continued worsening shortness of breath, weakness, eventually completed IVIG 2g/kg,  finished around January 20 2015, was discharged to rehabilitation, now back home, hospital course was also complicated by C. difficile infection, which required prolonged antibiotics, possible pneumonia  He is now taking Mestinon 60 mg 4 times a day, prednisone 20 mg 3 tablets every morning, sleep in lifting chair, eats soft food, still has peg  Tube in place. Generalized weakness, fatigue, no double vision   UPDATE March 05 2015:  He got home IVIG from April 12-16, there is no significant improvement, he has worsening swallowing difficulty, started tube feedings in April 16th, not eating by mouth, he sleeps in bed, two pillow, no significant breathing difficulty. He has no double vision,   He was recently evaluated by his oncologist Dr. Hinton Rao, had a metastatic liver cancer, is planning on to have chemotherapy,   For his myasthenia gravis, He is takign mestinon, 60mg  qid, seat, prednisone 20mg  2tabs,  REVIEW OF SYSTEMS: Full 14 system review of systems performed and notable only for as above generalized weakness and fatigue   ALLERGIES: Allergies  Allergen Reactions  . Ibuprofen Other (See Comments)    Interacts with another medication patient is taking    HOME MEDICATIONS: Current Outpatient Prescriptions  Medication Sig Dispense Refill  . amiodarone (PACERONE) 100 MG tablet Place 1 tablet (100 mg total) into feeding tube daily.    Marland Kitchen antiseptic oral rinse (CPC / CETYLPYRIDINIUM CHLORIDE 0.05%) 0.05 % LIQD solution 7 mLs by Mouth Rinse route 2 (two) times daily. 946 mL 1  .  artificial tears (LACRILUBE) OINT ophthalmic ointment Place into both eyes every 4 (four) hours as needed for dry eyes.  0   . atorvastatin (LIPITOR) 40 MG tablet Place 1 tablet (40 mg total) into feeding tube daily.    . cephALEXin (KEFLEX) 250 MG/5ML suspension Place 5 mLs (250 mg total) into feeding tube every 8 (eight) hours. 35 mL 0  . chlorhexidine (PERIDEX) 0.12 % solution 15 mLs by Mouth Rinse route 2 (two) times daily. 120 mL 0  . famotidine (PEPCID) 40 MG/5ML suspension Take 5 mLs (40 mg total) by mouth daily. 150 mL 0  . insulin aspart (NOVOLOG FLEXPEN) 100 UNIT/ML FlexPen Inject 0-10 Units into the skin 4 (four) times daily. Use according to scale prior to tube feeds. 15 mL 1  . ipratropium (ATROVENT HFA) 17 MCG/ACT inhaler Inhale 1 puff into the lungs every 6 (six) hours as needed for wheezing.    Marland Kitchen ipratropium (ATROVENT) 0.02 % nebulizer solution Inhale 2.5 mLs (0.5 mg total) into the lungs every 6 (six) hours as needed (wHEEZING.). 75 mL 12  . metoprolol tartrate (LOPRESSOR) 25 MG tablet Place 0.5 tablets (12.5 mg total) into feeding tube daily.    . nateglinide (STARLIX) 120 MG tablet Place 1 tablet (120 mg total) into feeding tube 3 (three) times daily. 90 tablet 1  . Nutritional Supplements (FEEDING SUPPLEMENT, JEVITY 1.2 CAL,) LIQD Place 390 mLs into feeding tube 4 (four) times daily -  with meals and at bedtime.  0  . ondansetron (ZOFRAN) 4 MG tablet Take 1 tablet (4 mg total) by mouth every 6 (six) hours as needed for nausea. 20 tablet 0  . potassium chloride 20 MEQ/15ML (10%) SOLN Place 15 mLs (20 mEq total) into feeding tube 3 (three) times daily. 3800 mL 0  . predniSONE (DELTASONE) 20 MG tablet Place 3 tablets (60 mg total) into feeding tube daily with breakfast. 150 tablet 1  . pyridostigmine (MESTINON) 60 MG tablet 1.5 tablets (90 mg total) by Per NG tube route 4 (four) times daily. 135 tablet 1  . rivaroxaban (XARELTO) 20 MG TABS tablet Take 1 tablet (20 mg total) by mouth daily with supper.    . senna (SENOKOT) 8.6 MG TABS tablet Place 1 tablet (8.6 mg total) into feeding tube daily as needed  for mild constipation. 120 each 0  . Water For Irrigation, Sterile (FREE WATER) SOLN Place 150 mLs into feeding tube 4 (four) times daily - after meals and at bedtime.     No current facility-administered medications for this visit.    PAST MEDICAL HISTORY: Past Medical History  Diagnosis Date  . Atrial fibrillation, chronic     on Xarelto   . HTN (hypertension)   . CHF (congestive heart failure)   . Diabetes mellitus   . 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 Myasthenia gravis     PAST SURGICAL HISTORY: Past Surgical History  Procedure Laterality Date  . Abdominal wall metastasis removal      FAMILY HISTORY: Family History  Problem Relation Age of Onset  . Colon cancer Mother   . Heart attack Father   . Diabetes Father     SOCIAL HISTORY:  History   Social History  . Marital Status: Married    Spouse Name: N/A  . Number of Children: 3  . Years of Education: 14   Occupational History  . Retired    Social History Main Topics  .  Smoking status: Never Smoker   . Smokeless tobacco: Not on file  . Alcohol Use: No  . Drug Use: No  . Sexual Activity: No   Other Topics Concern  . Not on file   Social History Narrative   Right-handed.   Lives at home with his wife.   No caffeine use.     PHYSICAL EXAM   Filed Vitals:   03/05/15 1106  BP: 126/82  Pulse: 66  Height: 5\' 7"  (1.702 m)  Weight: 212 lb (96.163 kg)    Not recorded      Body mass index is 33.2 kg/(m^2).  PHYSICAL EXAMNIATION:  Gen: NAD, conversant, well nourised, obese, well groomed                     Cardiovascular: Regular rate rhythm, no peripheral edema, warm, nontender. Eyes: Conjunctivae clear without exudates or hemorrhage Neck: Supple, no carotid bruise. Pulmonary:  poor air movements at the base of his lung   NEUROLOGICAL EXAM:  MENTAL STATUS: Speech: Profound dysarthria Cognition:    The patient is oriented to  person, place, and time;     recent and remote memory intact;     language fluent;     normal attention, concentration,     fund of knowledge.  CRANIAL NERVES: CN II: Visual fields are full to confrontation. Fundoscopic exam is normal with sharp discs and no vascular changes. Venous pulsations are present bilaterally. Pupils are 4 mm and briskly reactive to light. Visual acuity is 20/20 bilaterally. CN III, IV, VI: Eye movement was full, CN V: Facial sensation is intact to pinprick in all 3 divisions bilaterally. Corneal responses are intact.  CN VII: Moderate  bilateral eye closure, cheek puff, tongue protrusion, soft palate movement weakness CN VIII: Hearing is normal to rubbing fingers CN IX, X: Palate elevates symmetrically. Phonation is normal. CN XI: Head turning and shoulder shrug are intact CN XII: Tongue is midline with normal movements and no atrophy. Mild neck flexion weakness  MOTOR: He has bilateral intrinsic hand muscle atrophy, moderate weakness of bilateral shoulder abduction, elbow flexion, hand grip, hip flexion, ankle dorsiflexion  REFLEXES: Hypoactive and symmetric. Plantar responses are flexor.  SENSORY: Light touch, pinprick, position sense, and vibration sense are intact in fingers and toes.  COORDINATION: Rapid alternating movements and fine finger movements are intact. There is no dysmetria on finger-to-nose and heel-knee-shin. There are no abnormal or extraneous movements.   GAIT/STANCE: Need to push up from seated position, cautious, mildly unsteady gait    DIAGNOSTIC DATA (LABS, IMAGING, TESTING) - I reviewed patient records, labs, notes, testing and imaging myself where available.  Lab Results  Component Value Date   WBC 9.7 01/23/2015   HGB 10.9* 01/23/2015   HCT 33.4* 01/23/2015   MCV 93.6 01/23/2015   PLT 171 01/23/2015      Component Value Date/Time   NA 142 01/23/2015 0608   K 3.5 01/23/2015 0608   CL 104 01/23/2015 0608   CO2 32  01/23/2015 0608   GLUCOSE 156* 01/23/2015 0608   BUN 16 01/23/2015 0608   CREATININE 0.60 01/23/2015 0608   CALCIUM 8.4 01/23/2015 0608   PROT 4.1* 01/23/2015 0608   ALBUMIN 2.9* 01/23/2015 0608   AST 17 01/23/2015 0608   ALT 15 01/23/2015 0608   ALKPHOS 38* 01/23/2015 0608   BILITOT 0.6 01/23/2015 0608   GFRNONAA >90 01/23/2015 0608   GFRAA >90 01/23/2015 0608   No results found for: CHOL, HDL,  [place], LDLDIRECT, TRIG, CHOLHDL Lab Results  Component Value Date   HGBA1C 6.9* 12/28/2014   No results found for: [id number] Lab Results  Component Value Date   TSH 0.393 12/27/2014    ASSESSMENT AND PLAN  GLENDEL JAGGERS is a 74 y.o. male  with generalized myasthenia gravis, presented with exacerbation, breathing difficulty, require intubation, profound dysphagia require PEG tube placement in January 2015, mild to moderate improvement following plasma exchange, last one was in February second 2016, metastatic bladder cancer, finished chemotherapy in October 2015,   1, myasthenia gravis with exacerbation, 2, home health IVIG from April 12 to 16, 2016. 3, keep current dose of Mestinon 60 mg 4 times a day, decrease prednisone to 20 mg 1 tab  daily, 4, MRI brain 5. Follow up with his oncologist Dr. Hinton Rao for evaluation and treatment of his metastatic cancer, from myasthenia standpoint, there was no limitation at the potential chemotherapy agents that is needed for his metastatic cancer  6, return to clinic in one month   Marcial Pacas, M.D. Ph.D.  [facility] [address] [place], [place] [address] Ph: [phone]

## 2015-03-06 ENCOUNTER — Encounter: Payer: Self-pay | Admitting: *Deleted

## 2015-03-09 ENCOUNTER — Encounter: Payer: Self-pay | Admitting: *Deleted

## 2015-03-11 ENCOUNTER — Encounter: Payer: Self-pay | Admitting: *Deleted

## 2015-03-11 ENCOUNTER — Encounter: Payer: PPO | Admitting: Physical Medicine & Rehabilitation

## 2015-03-15 DEATH — deceased

## 2015-03-17 ENCOUNTER — Encounter: Payer: Self-pay | Admitting: *Deleted

## 2015-03-17 ENCOUNTER — Other Ambulatory Visit: Payer: Self-pay | Admitting: *Deleted

## 2015-03-17 DIAGNOSIS — G7000 Myasthenia gravis without (acute) exacerbation: Secondary | ICD-10-CM

## 2015-03-17 DIAGNOSIS — E118 Type 2 diabetes mellitus with unspecified complications: Secondary | ICD-10-CM

## 2015-03-17 NOTE — Patient Outreach (Addendum)
[facility]) Care Management  03/17/2015  KEENE GILKEY 1941-03-12 [id number]   Scheduled phone call to patient home, Mrs. Eliel Dudding informed me that patient passed away on 03/18/2023 at [facility]. Expressed condolence to Mrs. [place], [place], [place] Care Management [phone].

## 2015-03-20 ENCOUNTER — Encounter: Payer: Self-pay | Admitting: *Deleted

## 2015-04-07 ENCOUNTER — Ambulatory Visit: Payer: PPO | Admitting: Neurology

## 2015-08-13 IMAGING — CR DG CHEST 2V
2 series · 3 of 3 positions shown · non-contrast
Comparison: 12/28/2014

CLINICAL DATA: Chronic shortness of breath for 2 years. Cough last
24 hr.

EXAM:
CHEST  2 VIEW

[Series 1: chest lat · 0.14mm/px · 2 of 2 slices shown]
[im 1/2]
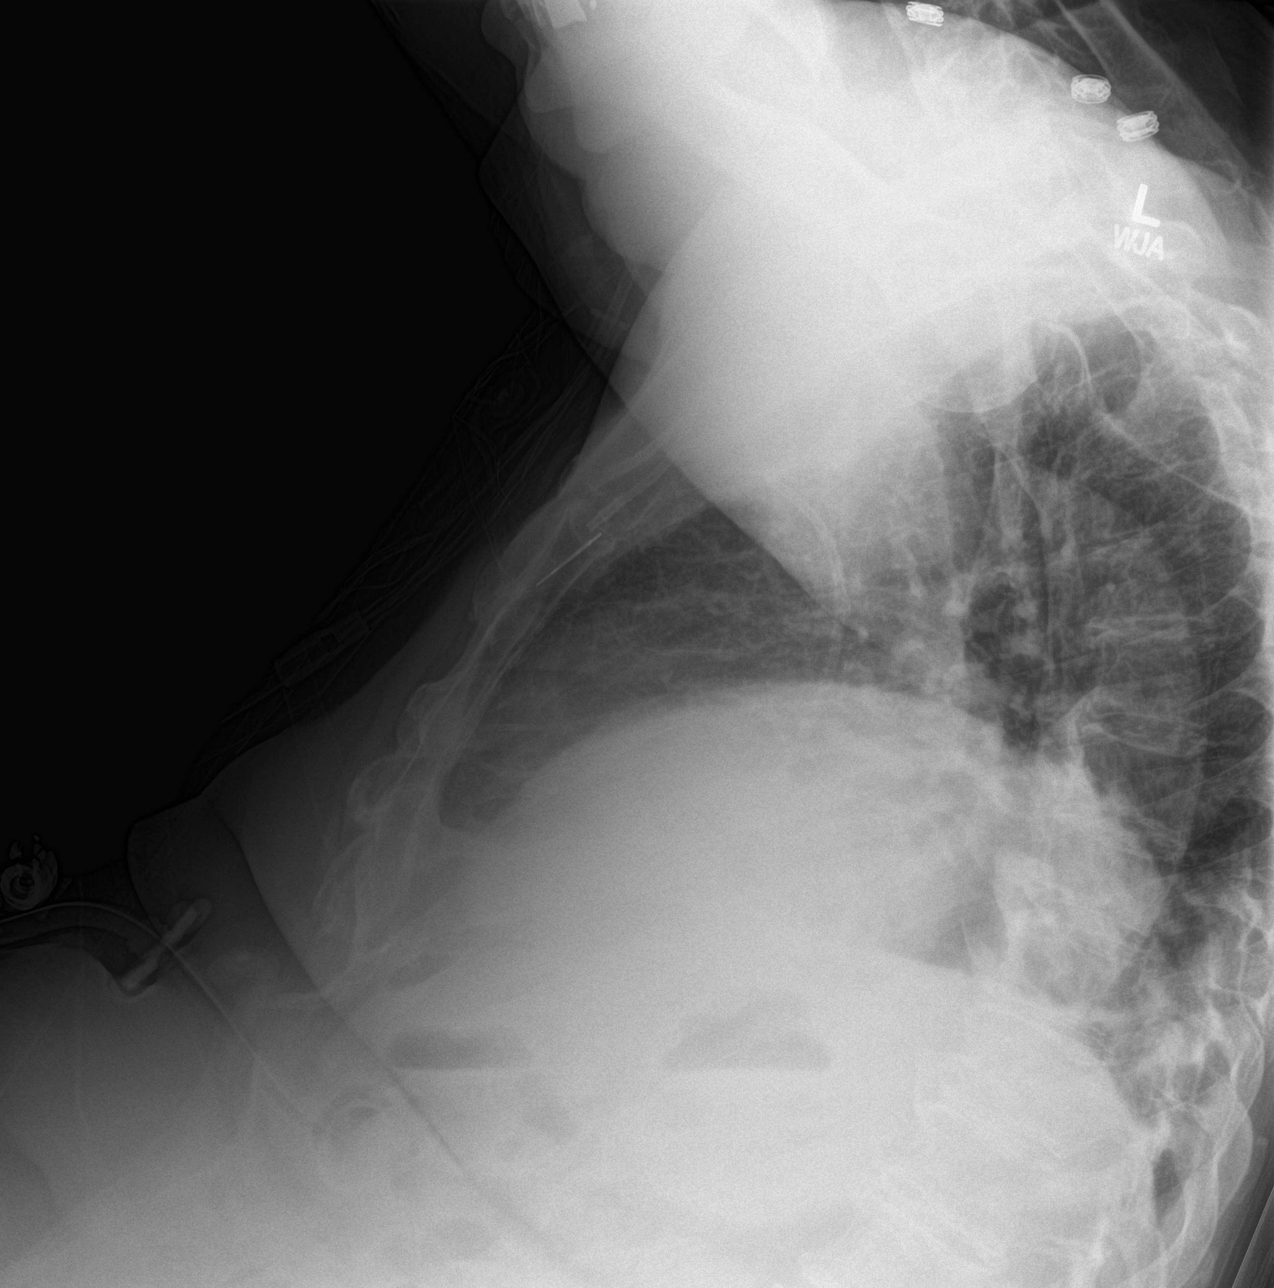
[im 2/2]
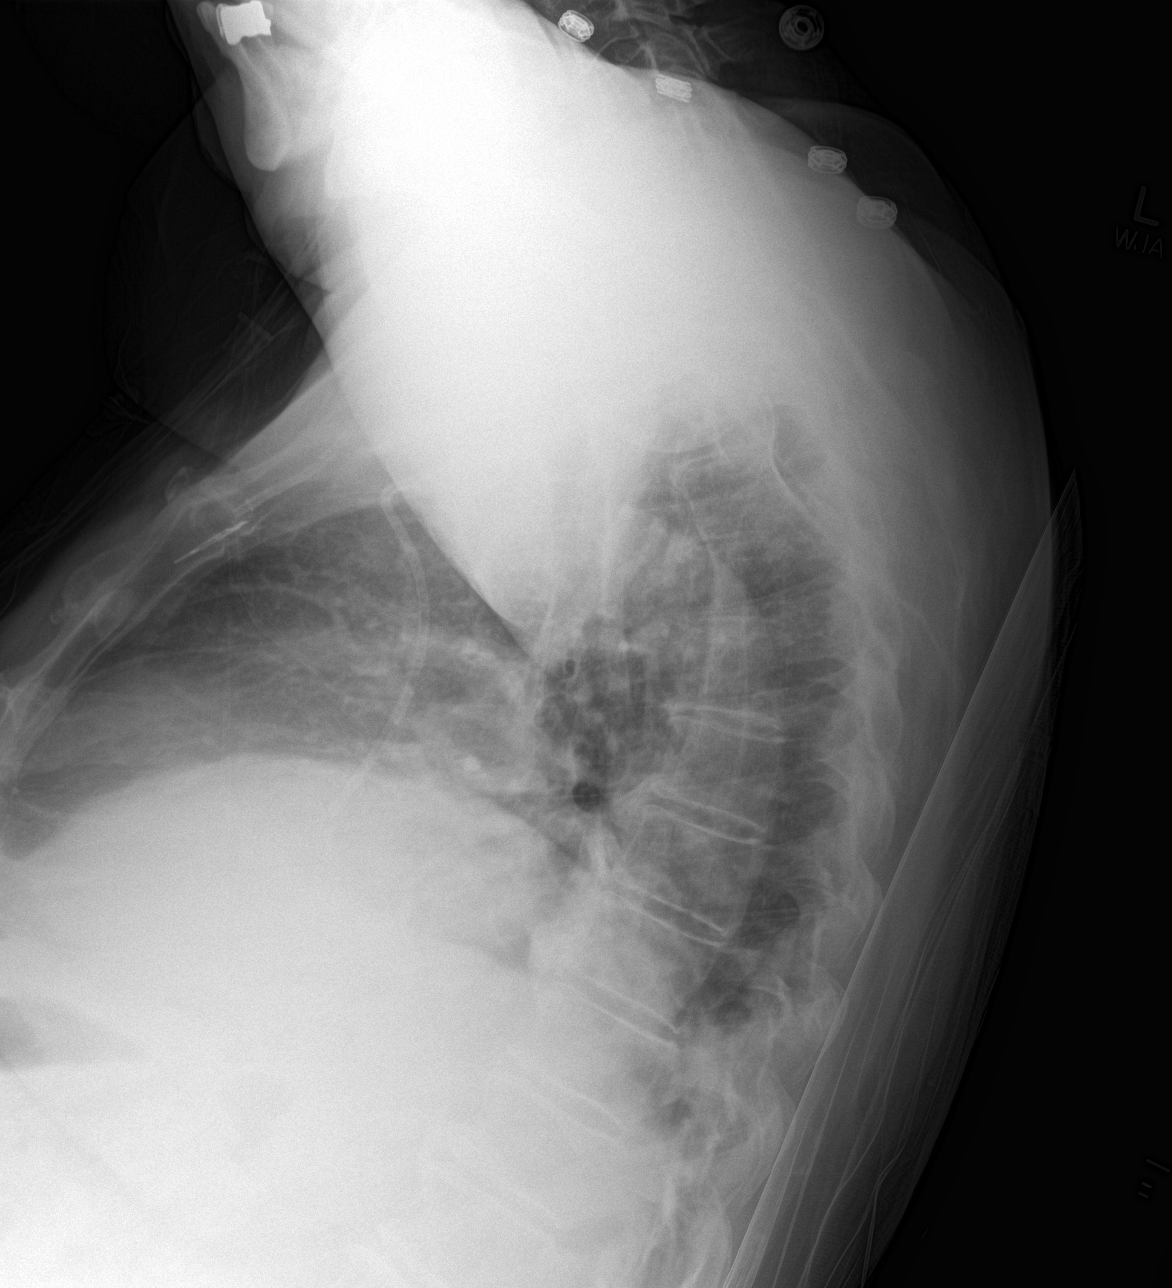

[chest ap]
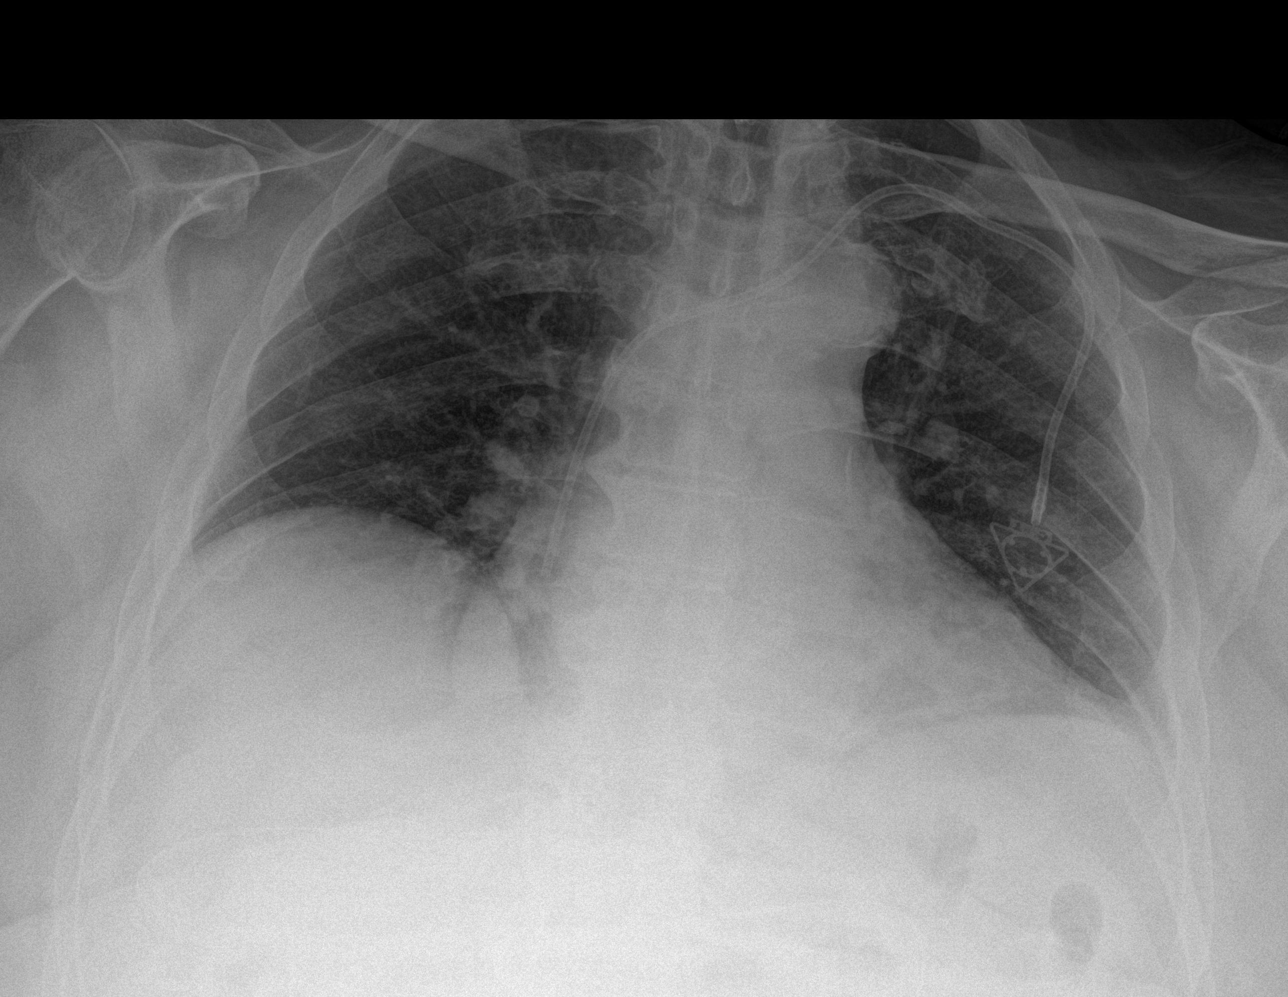

[3 of 3 positions shown; findings below may reference images not displayed]

FINDINGS: Left Port-A-Cath remains in place, unchanged. Right central line has
been removed. There is cardiomegaly. No confluent airspace opacities
or effusions. Mild elevation of the right hemidiaphragm, stable.
IMPRESSION: Cardiomegaly.  No active disease.

## 2015-08-14 IMAGING — CR DG CHEST 1V PORT
1 series · 1 of 1 positions shown · non-contrast
Comparison: Radiograph 01/10/2015

CLINICAL DATA: Short of breath was congestion

EXAM:
PORTABLE CHEST - 1 VIEW

[AP]
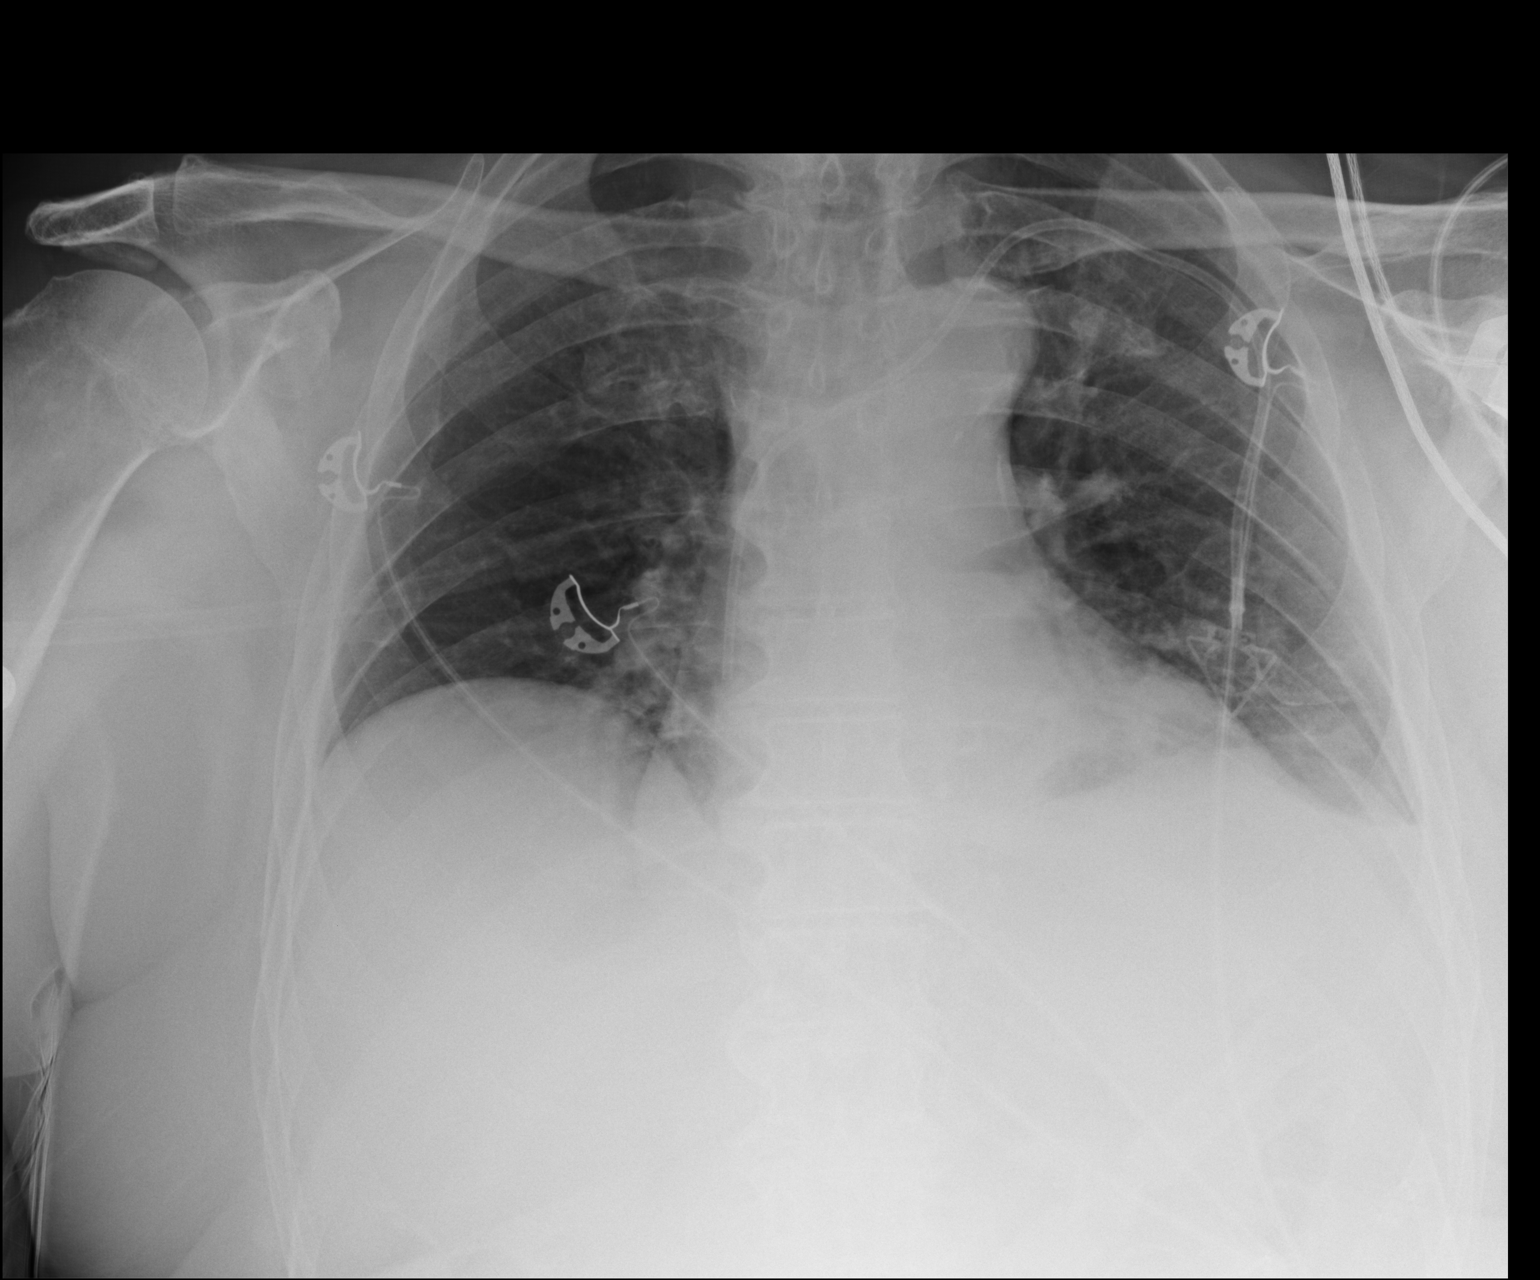

[1 of 1 positions shown; findings below may reference images not displayed]

FINDINGS: Left power port. Normal cardiac silhouette. Low lung volumes. There
is left basilar atelectasis. No effusion or infiltrate. No
pneumothorax.
IMPRESSION: Low lung volumes and left basilar atelectasis. No significant
change.

## 2015-08-19 IMAGING — CR DG CHEST 1V PORT
1 series · 1 of 1 positions shown · non-contrast
Comparison: 01/15/2015

CLINICAL DATA: Shortness of breath for 1 week

EXAM:
PORTABLE CHEST - 1 VIEW

[AP]
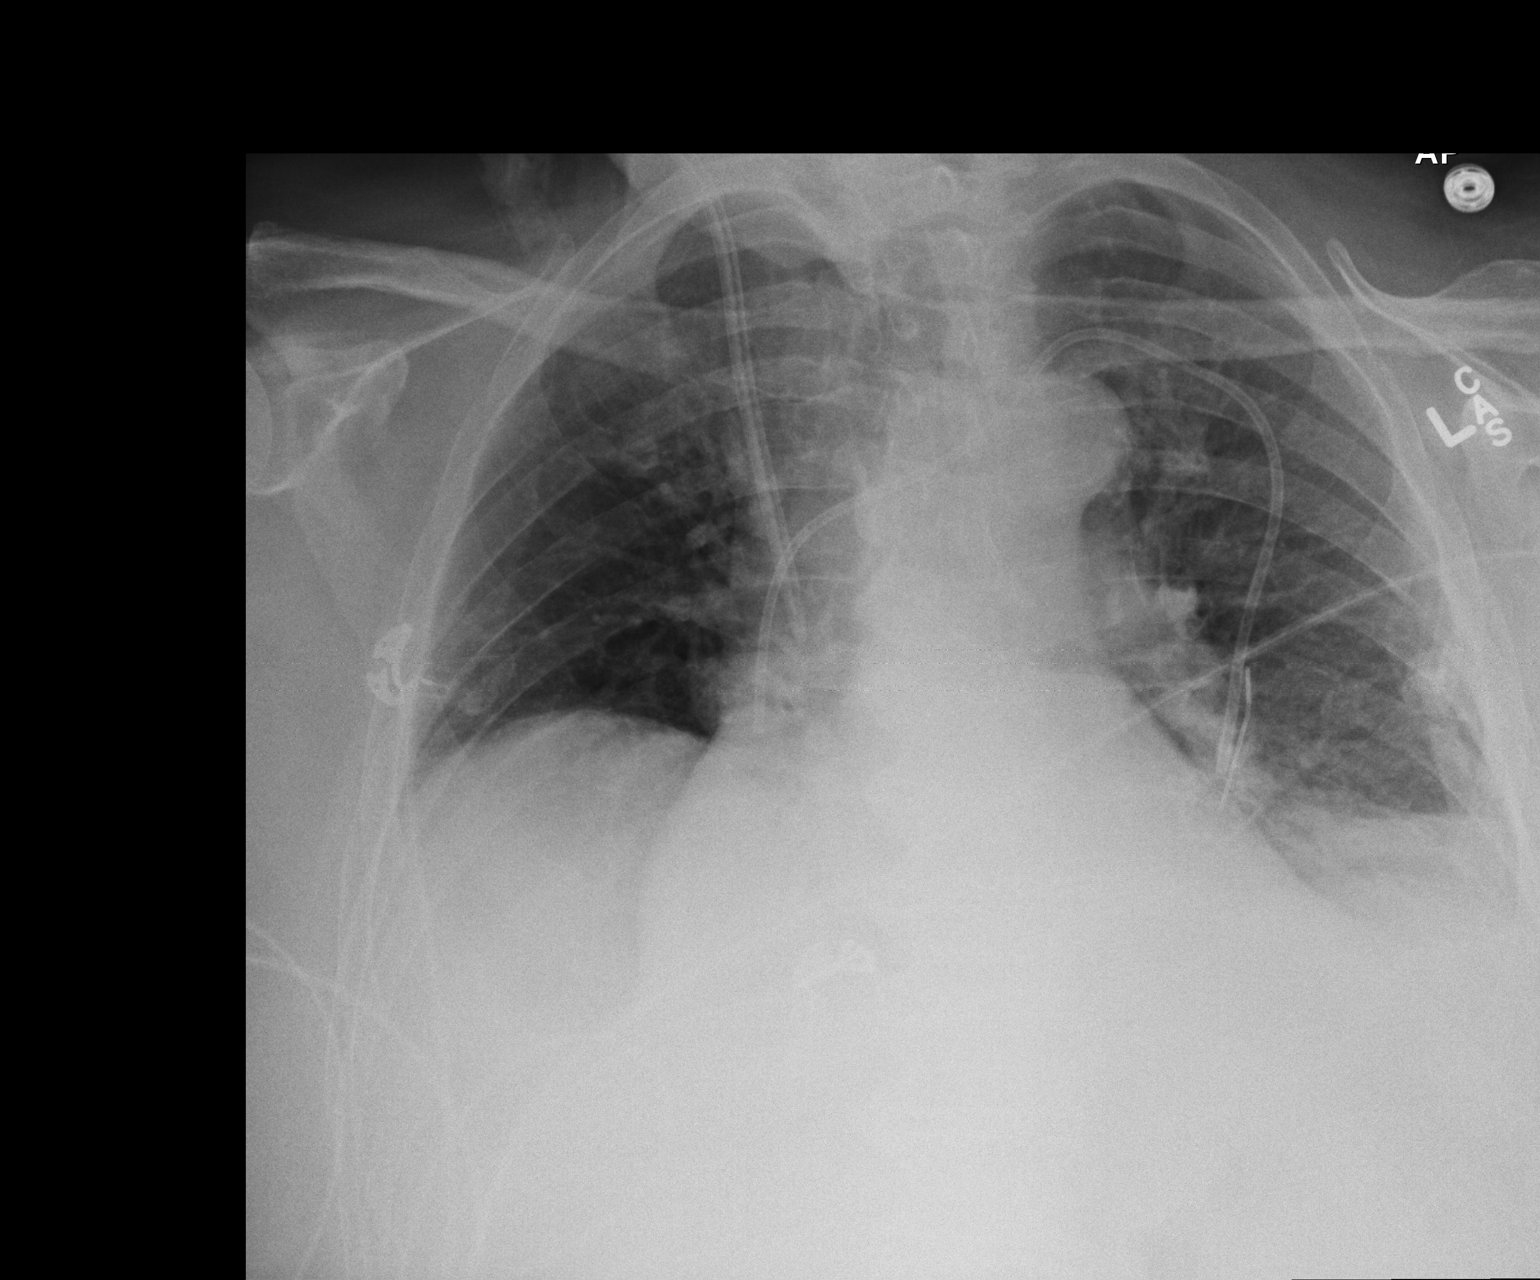

[1 of 1 positions shown; findings below may reference images not displayed]

FINDINGS: Cardiomegaly again noted. Study is limited by poor inspiration with
bilateral basilar atelectasis. Stable right IJ central line with tip
in SVC. Stable left Port-A-Cath with tip in SVC right atrium
junction. No segmental infiltrate or pulmonary edema.
IMPRESSION: Cardiomegaly again noted. Stable right IJ catheter and left
Port-A-Cath position. Bilateral basilar atelectasis. No segmental
infiltrate or pulmonary edema.

## 2015-11-24 ENCOUNTER — Encounter: Payer: Self-pay | Admitting: *Deleted
